# Patient Record
Sex: Female | Born: 1975
Health system: Southern US, Community
[De-identification: ages and names within clinical notes are randomized; demographics above are authoritative.]

## PROBLEM LIST (undated history)

## (undated) DIAGNOSIS — M1990 Unspecified osteoarthritis, unspecified site: Secondary | ICD-10-CM

## (undated) DIAGNOSIS — K5904 Chronic idiopathic constipation: Secondary | ICD-10-CM

## (undated) DIAGNOSIS — L900 Lichen sclerosus et atrophicus: Secondary | ICD-10-CM

## (undated) DIAGNOSIS — M5030 Other cervical disc degeneration, unspecified cervical region: Secondary | ICD-10-CM

## (undated) DIAGNOSIS — L658 Other specified nonscarring hair loss: Secondary | ICD-10-CM

## (undated) DIAGNOSIS — E785 Hyperlipidemia, unspecified: Secondary | ICD-10-CM

## (undated) DIAGNOSIS — E0500 Thyrotoxicosis with diffuse goiter without thyrotoxic crisis or storm: Secondary | ICD-10-CM

## (undated) DIAGNOSIS — T7840XA Allergy, unspecified, initial encounter: Secondary | ICD-10-CM

## (undated) DIAGNOSIS — I10 Essential (primary) hypertension: Secondary | ICD-10-CM

## (undated) DIAGNOSIS — I7300 Raynaud's syndrome without gangrene: Secondary | ICD-10-CM

## (undated) DIAGNOSIS — E039 Hypothyroidism, unspecified: Secondary | ICD-10-CM

## (undated) HISTORY — DX: Other cervical disc degeneration, unspecified cervical region: M50.30

## (undated) HISTORY — DX: Allergy, unspecified, initial encounter: T78.40XA

## (undated) HISTORY — DX: Other specified nonscarring hair loss: L65.8

## (undated) HISTORY — DX: Chronic idiopathic constipation: K59.04

## (undated) HISTORY — DX: Thyrotoxicosis with diffuse goiter without thyrotoxic crisis or storm: E05.00

## (undated) HISTORY — DX: Hyperlipidemia, unspecified: E78.5

## (undated) HISTORY — DX: Essential (primary) hypertension: I10

## (undated) HISTORY — PX: ABDOMINOPLASTY: SUR9

## (undated) HISTORY — DX: Unspecified osteoarthritis, unspecified site: M19.90

## (undated) HISTORY — PX: PLACEMENT OF BREAST IMPLANTS: SHX6334

## (undated) HISTORY — PX: COSMETIC SURGERY: SHX468

## (undated) HISTORY — PX: THYROID SURGERY: SHX805

## (undated) HISTORY — PX: TUBAL LIGATION: SHX77

## (undated) HISTORY — DX: Lichen sclerosus et atrophicus: L90.0

## (undated) HISTORY — PX: CHOLECYSTECTOMY: SHX55

## (undated) HISTORY — DX: Raynaud's syndrome without gangrene: I73.00

---

## 2011-05-10 DIAGNOSIS — F339 Major depressive disorder, recurrent, unspecified: Secondary | ICD-10-CM | POA: Insufficient documentation

## 2011-05-10 DIAGNOSIS — F32A Depression, unspecified: Secondary | ICD-10-CM | POA: Insufficient documentation

## 2011-05-10 DIAGNOSIS — F332 Major depressive disorder, recurrent severe without psychotic features: Secondary | ICD-10-CM | POA: Insufficient documentation

## 2014-08-01 DIAGNOSIS — K1370 Unspecified lesions of oral mucosa: Secondary | ICD-10-CM | POA: Insufficient documentation

## 2016-07-09 LAB — HM PAP SMEAR: HM Pap smear: NEGATIVE

## 2016-12-06 DIAGNOSIS — Z975 Presence of (intrauterine) contraceptive device: Secondary | ICD-10-CM | POA: Insufficient documentation

## 2019-03-19 ENCOUNTER — Ambulatory Visit (INDEPENDENT_AMBULATORY_CARE_PROVIDER_SITE_OTHER): Payer: No Typology Code available for payment source | Admitting: Family Medicine

## 2019-03-19 ENCOUNTER — Encounter: Payer: Self-pay | Admitting: Family Medicine

## 2019-03-19 VITALS — BP 112/76 | HR 72 | Ht 63.0 in | Wt 145.0 lb

## 2019-03-19 DIAGNOSIS — N924 Excessive bleeding in the premenopausal period: Secondary | ICD-10-CM

## 2019-03-19 DIAGNOSIS — R058 Other specified cough: Secondary | ICD-10-CM | POA: Insufficient documentation

## 2019-03-19 DIAGNOSIS — I10 Essential (primary) hypertension: Secondary | ICD-10-CM | POA: Diagnosis not present

## 2019-03-19 DIAGNOSIS — Z1231 Encounter for screening mammogram for malignant neoplasm of breast: Secondary | ICD-10-CM

## 2019-03-19 DIAGNOSIS — E785 Hyperlipidemia, unspecified: Secondary | ICD-10-CM | POA: Insufficient documentation

## 2019-03-19 DIAGNOSIS — R05 Cough: Secondary | ICD-10-CM

## 2019-03-19 DIAGNOSIS — E039 Hypothyroidism, unspecified: Secondary | ICD-10-CM | POA: Diagnosis not present

## 2019-03-19 DIAGNOSIS — L900 Lichen sclerosus et atrophicus: Secondary | ICD-10-CM

## 2019-03-19 DIAGNOSIS — L658 Other specified nonscarring hair loss: Secondary | ICD-10-CM | POA: Insufficient documentation

## 2019-03-19 DIAGNOSIS — N920 Excessive and frequent menstruation with regular cycle: Secondary | ICD-10-CM | POA: Insufficient documentation

## 2019-03-19 DIAGNOSIS — M545 Low back pain: Secondary | ICD-10-CM

## 2019-03-19 DIAGNOSIS — M47816 Spondylosis without myelopathy or radiculopathy, lumbar region: Secondary | ICD-10-CM | POA: Insufficient documentation

## 2019-03-19 DIAGNOSIS — G8929 Other chronic pain: Secondary | ICD-10-CM

## 2019-03-19 NOTE — Assessment & Plan Note (Signed)
Uses baclofen and meloxicam as needed.

## 2019-03-19 NOTE — Assessment & Plan Note (Signed)
Currently on treatment with topical minoxidil and oral finasteride.  Followed by dermatology.

## 2019-03-19 NOTE — Assessment & Plan Note (Signed)
Followed by GYN.  Uses as needed topical clobetasol ointment.

## 2019-03-19 NOTE — Assessment & Plan Note (Addendum)
Due to recheck TSH.  On Brand only Synthroid. Currently on treatment with topical minoxidil and oral finasteride.

## 2019-03-19 NOTE — Assessment & Plan Note (Signed)
Well controlled. Continue current regimen. Follow up in  6 mo

## 2019-03-19 NOTE — Assessment & Plan Note (Signed)
Responds well to Singulair. Triggered by strong smells like perfume.

## 2019-03-19 NOTE — Progress Notes (Signed)
New Patient Office Visit  Subjective:  Patient ID: April Webster, female    DOB: 1975-12-06  Age: 44 y.o. MRN: [id number]  CC:  Chief Complaint  Patient presents with  . Establish Care   HPI April Webster presents to establish care.    Hyperlipidemia - tolerating stating well with no myalgias or significant side effects.  She started on the medication 6 months ago.  Has not had follow-up lab work yet. No results found for: CHOL No results found for: HDL No results found for: LDLCALC No results found for: TRIG No results found for: CHOLHDL No results found for: LDLDIRECT  Hypertension- Pt denies chest pain, SOB, dizziness, or heart palpitations.  Taking meds as directed w/o problems.  Denies medication side effects.    Hypothyroidism - Taking medication regularly in the AM away from food and vitamins, etc. No recent change to skin, hair, or energy levels.  She says she usually is either between 112 mcg 125 mcg dose.  Her dose was just dropped to 112 mcg about 6 months ago she did have a 6-week follow-up but has not been rechecked since then.  History of hair loss-currently uses topical minoxidil and oral finasteride.  Feels like it works very well and is effective.  Followed by dermatology.  Reports a history of chronic low back pain he uses baclofen meloxicam as needed.  Has a history of allergic bronchospasm and since being on Singulair it has made a big difference.  She seems to be very sensitive to strong odors and smells.  GERD-uses omeprazole as needed.  3 of lichen sclerosis for greater than 10 years uses topical clobetasol ointment very rarely.  She also needs a screening mammogram.  She is at the point of getting replacement saline breast implants and needs an up-to-date mammogram before surgery.  He had a normal mammogram last year at [facility].    History reviewed. No pertinent past medical history.  History reviewed. No pertinent surgical history.  History  reviewed. No pertinent family history.  Social History   Socioeconomic History  . Marital status: Married    Spouse name: Not on file  . Number of children: Not on file  . Years of education: Not on file  . Highest education level: Not on file  Occupational History  . Not on file  Tobacco Use  . Smoking status: Former Smoker    Packs/day: 2.00    Years: 5.00    Pack years: 10.00    Types: Cigarettes    Quit date: 03/15/1996    Years since quitting: 23.0  . Smokeless tobacco: Never Used  Substance and Sexual Activity  . Alcohol use: Yes    Alcohol/week: 2.0 standard drinks    Types: 2 Glasses of wine per week  . Drug use: Never  . Sexual activity: Yes    Partners: Male  Other Topics Concern  . Not on file  Social History Narrative  . Not on file   Social Determinants of Health   Financial Resource Strain:   . Difficulty of Paying Living Expenses: Not on file  Food Insecurity:   . Worried About Charity fundraiser in the Last Year: Not on file  . Ran Out of Food in the Last Year: Not on file  Transportation Needs:   . Lack of Transportation (Medical): Not on file  . Lack of Transportation (Non-Medical): Not on file  Physical Activity:   . Days of Exercise per Week: Not  on file  . Minutes of Exercise per Session: Not on file  Stress:   . Feeling of Stress : Not on file  Social Connections:   . Frequency of Communication with Friends and Family: Not on file  . Frequency of Social Gatherings with Friends and Family: Not on file  . Attends Religious Services: Not on file  . Active Member of Clubs or Organizations: Not on file  . Attends Archivist Meetings: Not on file  . Marital Status: Not on file  Intimate Partner Violence:   . Fear of Current or Ex-Partner: Not on file  . Emotionally Abused: Not on file  . Physically Abused: Not on file  . Sexually Abused: Not on file    ROS Review of Systems  Constitutional: Negative for diaphoresis, fever and  unexpected weight change.  HENT: Negative for hearing loss, rhinorrhea and tinnitus.   Eyes: Negative for visual disturbance.  Respiratory: Negative for cough and wheezing.   Cardiovascular: Negative for chest pain and palpitations.  Gastrointestinal: Negative for blood in stool, diarrhea, nausea and vomiting.  Genitourinary: Negative for difficulty urinating, vaginal bleeding and vaginal discharge.  Musculoskeletal: Positive for back pain and neck pain. Negative for arthralgias and myalgias.  Skin: Negative for rash.  Neurological: Negative for headaches.  Hematological: Negative for adenopathy. Does not bruise/bleed easily.  Psychiatric/Behavioral: Negative for dysphoric mood and sleep disturbance (.cmhypot). The patient is not nervous/anxious.     Objective:   Today's Vitals: BP 112/76   Pulse 72   Ht 5' 3"  (1.6 m)   Wt 145 lb (65.8 kg)   SpO2 100%   BMI 25.69 kg/m   Physical Exam Constitutional:      Appearance: She is well-developed.  HENT:     Head: Normocephalic and atraumatic.  Cardiovascular:     Rate and Rhythm: Normal rate and regular rhythm.     Heart sounds: Normal heart sounds.  Pulmonary:     Effort: Pulmonary effort is normal.     Breath sounds: Normal breath sounds.  Chest:     Breasts:        Right: Normal. No swelling, inverted nipple, mass, nipple discharge, skin change or tenderness.        Left: Normal. No swelling, inverted nipple, mass, nipple discharge, skin change or tenderness.  Skin:    General: Skin is warm and dry.  Neurological:     Mental Status: She is alert and oriented to person, place, and time.  Psychiatric:        Behavior: Behavior normal.     Assessment & Plan:   Problem List Items Addressed This Visit      Cardiovascular and Mediastinum   Essential hypertension    Well controlled. Continue current regimen. Follow up in  6 mo      Relevant Medications   atorvastatin (LIPITOR) 20 MG tablet   hydrochlorothiazide  (HYDRODIURIL) 25 MG tablet   Other Relevant Orders   COMPLETE METABOLIC PANEL WITH GFR     Endocrine   Hypothyroidism - Primary    Due to recheck TSH.  On Brand only Synthroid. Currently on treatment with topical minoxidil and oral finasteride.      Relevant Medications   SYNTHROID 112 MCG tablet   Other Relevant Orders   TSH     Musculoskeletal and Integument   Lichen sclerosus    Followed by GYN.  Uses as needed topical clobetasol ointment.      Female pattern hair loss  Currently on treatment with topical minoxidil and oral finasteride.  Followed by dermatology.        Other   Upper airway cough syndrome    Responds well to Singulair. Triggered by strong smells like perfume.       Menorrhagia    Mirena IUD in place. Due to replace 2023      Hyperlipidemia   Relevant Medications   atorvastatin (LIPITOR) 20 MG tablet   hydrochlorothiazide (HYDRODIURIL) 25 MG tablet   Other Relevant Orders   COMPLETE METABOLIC PANEL WITH GFR   Lipid panel   Chronic low back pain    Uses baclofen and meloxicam as needed.      Relevant Medications   baclofen (LIORESAL) 10 MG tablet   meloxicam (MOBIC) 15 MG tablet    Other Visit Diagnoses    Screening mammogram, encounter for       Relevant Orders   MM SCREENING BREAST TOMO BILATERAL      Plan for screening mammogram for prior to surgery for saline implant replacement.  Outpatient Encounter Medications as of 03/19/2019  Medication Sig  . atorvastatin (LIPITOR) 20 MG tablet Take 20 mg by mouth at bedtime.  . baclofen (LIORESAL) 10 MG tablet Take 10 mg by mouth 3 (three) times daily as needed.  . clobetasol ointment (TEMOVATE) 3.88 % Apply 1 application topically daily as needed for rash. LIchen sclerosis  . esomeprazole (NEXIUM) 20 MG capsule Take 20 mg by mouth daily at 12 noon.  . finasteride (PROSCAR) 5 MG tablet Take 2.5 mg by mouth daily.  . hydrochlorothiazide (HYDRODIURIL) 25 MG tablet Take 25 mg by mouth daily.  Marland Kitchen  levonorgestrel (MIRENA) 20 MCG/24HR IUD by Intrauterine route.  . meloxicam (MOBIC) 15 MG tablet Take 15 mg by mouth daily as needed.  . montelukast (SINGULAIR) 10 MG tablet Take 10 mg by mouth daily as needed.  Marland Kitchen SYNTHROID 112 MCG tablet Take 112 mcg by mouth daily.   No facility-administered encounter medications on file as of 03/19/2019.    Follow-up: Return in about 6 months (around 09/16/2019) for Hypertension, thyroid, etc .   Beatrice Lecher, MD

## 2019-03-19 NOTE — Assessment & Plan Note (Signed)
Mirena IUD in place. Due to replace 2023

## 2019-03-28 LAB — TSH: TSH: 3.09 mIU/L

## 2019-03-28 LAB — COMPLETE METABOLIC PANEL WITH GFR
AG Ratio: 1.9 (calc) (ref 1.0–2.5)
ALT: 18 U/L (ref 6–29)
AST: 21 U/L (ref 10–30)
Albumin: 4.3 g/dL (ref 3.6–5.1)
Alkaline phosphatase (APISO): 57 U/L (ref 31–125)
BUN: 14 mg/dL (ref 7–25)
CO2: 28 mmol/L (ref 20–32)
Calcium: 9.6 mg/dL (ref 8.6–10.2)
Chloride: 102 mmol/L (ref 98–110)
Creat: 0.77 mg/dL (ref 0.50–1.10)
GFR, Est African American: 110 mL/min/{1.73_m2} (ref >=60)
GFR, Est Non African American: 95 mL/min/{1.73_m2} (ref >=60)
Globulin: 2.3 g/dL (calc) (ref 1.9–3.7)
Glucose, Bld: 80 mg/dL (ref 65–99)
Potassium: 3.9 mmol/L (ref 3.5–5.3)
Sodium: 140 mmol/L (ref 135–146)
Total Bilirubin: 0.5 mg/dL (ref 0.2–1.2)
Total Protein: 6.6 g/dL (ref 6.1–8.1)

## 2019-03-28 LAB — LIPID PANEL
Cholesterol: 151 mg/dL (ref <200)
HDL: 57 mg/dL (ref >=50)
LDL Cholesterol (Calc): 80 mg/dL (calc)
Non-HDL Cholesterol (Calc): 94 mg/dL (calc) (ref <130)
Total CHOL/HDL Ratio: 2.6 (calc) (ref <5.0)
Triglycerides: 60 mg/dL (ref <150)

## 2019-04-03 ENCOUNTER — Other Ambulatory Visit: Payer: Self-pay | Admitting: *Deleted

## 2019-04-03 MED ORDER — MELOXICAM 15 MG PO TABS: 15.0000 mg | ORAL_TABLET | Freq: Every day | ORAL | 1 refills | Status: DC | PRN

## 2019-04-03 MED ORDER — HYDROCHLOROTHIAZIDE 25 MG PO TABS: 25.0000 mg | ORAL_TABLET | Freq: Every day | ORAL | 1 refills | Status: DC

## 2019-04-03 MED ORDER — BACLOFEN 10 MG PO TABS: 10.0000 mg | ORAL_TABLET | Freq: Three times a day (TID) | ORAL | 1 refills | Status: DC | PRN

## 2019-04-03 MED ORDER — MONTELUKAST SODIUM 10 MG PO TABS: 10.0000 mg | ORAL_TABLET | Freq: Every day | ORAL | 3 refills | Status: DC | PRN

## 2019-04-03 MED ORDER — FINASTERIDE 5 MG PO TABS: 2.5000 mg | ORAL_TABLET | Freq: Every day | ORAL | 3 refills | Status: DC

## 2019-04-03 MED ORDER — SYNTHROID 112 MCG PO TABS: 112.0000 ug | ORAL_TABLET | Freq: Every day | ORAL | 1 refills | Status: DC

## 2019-04-03 MED ORDER — ATORVASTATIN CALCIUM 20 MG PO TABS: 20.0000 mg | ORAL_TABLET | Freq: Every day | ORAL | 3 refills | Status: DC

## 2019-04-03 MED FILL — HYDROCHLOROTHIAZIDE 25 MG T: 25 | 90 days supply | Qty: 90 | Fill #0

## 2019-04-03 MED FILL — MELOXICAM 15 MG TABLET: 15 | 90 days supply | Qty: 90 | Fill #0

## 2019-04-03 MED FILL — MONTELUKAST SOD 10 MG TAB: 10 | 90 days supply | Qty: 90 | Fill #0

## 2019-04-03 MED FILL — ATORVASTATIN 20 MG TABLET: 20 | 90 days supply | Qty: 90 | Fill #0

## 2019-04-03 MED FILL — BACLOFEN 10 MG TABS: 10 | 30 days supply | Qty: 90 | Fill #0

## 2019-04-03 MED FILL — SYNTHROID 112 MCG TABLET: 112 | 90 days supply | Qty: 90 | Fill #0

## 2019-04-03 MED FILL — FINASTERIDE 5 MG TABLET: 5 | 90 days supply | Qty: 45 | Fill #0

## 2019-05-01 MED FILL — CEPHALEXIN 500 MG CAPSULE: 500 | 3 days supply | Qty: 9 | Fill #0

## 2019-05-01 MED FILL — OXYCODONE-ACETAMINOPHEN 5-3: 5-325 | 3 days supply | Qty: 25 | Fill #0

## 2019-05-01 MED FILL — PROMETHAZINE 12.5 MG TABLET: 12.5 | 3 days supply | Qty: 10 | Fill #0

## 2019-05-14 HISTORY — PX: AUGMENTATION MAMMAPLASTY: SUR837

## 2019-05-16 ENCOUNTER — Telehealth: Payer: Self-pay | Admitting: Family Medicine

## 2019-05-16 DIAGNOSIS — K219 Gastro-esophageal reflux disease without esophagitis: Secondary | ICD-10-CM | POA: Insufficient documentation

## 2019-05-16 MED ORDER — PANTOPRAZOLE SODIUM 20 MG PO TBEC: 20.0000 mg | DELAYED_RELEASE_TABLET | Freq: Every day | ORAL | 1 refills | Status: DC

## 2019-05-16 MED FILL — PANTOPRAZOLE SOD DR 20 MG T: 20 | 90 days supply | Qty: 90 | Fill #0

## 2019-05-16 NOTE — Telephone Encounter (Signed)
Reports has been having more heartburn.  Currently getting Nexium OTC.  Would like to try pantoprazole instead.  New prescription sent to pharmacy.

## 2019-06-06 ENCOUNTER — Telehealth: Payer: Self-pay | Admitting: Family Medicine

## 2019-06-06 MED ORDER — HYDROCODONE-ACETAMINOPHEN 5-325 MG PO TABS: 1.0000 | ORAL_TABLET | Freq: Two times a day (BID) | ORAL | 0 refills | Status: AC | PRN

## 2019-06-06 MED FILL — HYDROCODON-APAP 5-325: 5-325 | 5 days supply | Qty: 10 | Fill #0

## 2019-06-06 NOTE — Telephone Encounter (Signed)
Pt called and is still having some right sided rib pain s/p surgery.  Using NSAIDs, heat/ice and having hard time getting rest at night bc of pain.  Will rx 10 tab of hydrocodone for bedtime only. After that if not improving will need to see her surgery.  Did check the [place] Controlled sub registry.  Consider trial of gabapentin as well if pain persists.

## 2019-07-11 MED FILL — HYDROCHLOROTHIAZIDE 25 MG T: 25 | 90 days supply | Qty: 90 | Fill #1

## 2019-07-11 MED FILL — SYNTHROID 112 MCG TABLET: 112 | 90 days supply | Qty: 90 | Fill #1

## 2019-07-11 MED FILL — FINASTERIDE 5 MG TABLET: 5 | 90 days supply | Qty: 45 | Fill #1

## 2019-07-11 MED FILL — MELOXICAM 15 MG TABLET: 15 | 90 days supply | Qty: 90 | Fill #1

## 2019-07-11 MED FILL — BACLOFEN 10 MG TABS: 10 | 30 days supply | Qty: 90 | Fill #1

## 2019-07-11 MED FILL — MONTELUKAST SOD 10 MG TAB: 10 | 90 days supply | Qty: 90 | Fill #1

## 2019-07-11 MED FILL — ATORVASTATIN 20 MG TABLET: 20 | 90 days supply | Qty: 90 | Fill #1

## 2019-08-20 ENCOUNTER — Ambulatory Visit (INDEPENDENT_AMBULATORY_CARE_PROVIDER_SITE_OTHER): Payer: No Typology Code available for payment source | Admitting: Family Medicine

## 2019-08-20 ENCOUNTER — Encounter: Payer: Self-pay | Admitting: Family Medicine

## 2019-08-20 VITALS — BP 122/79 | HR 70 | Ht 63.0 in | Wt 153.0 lb

## 2019-08-20 DIAGNOSIS — E039 Hypothyroidism, unspecified: Secondary | ICD-10-CM | POA: Diagnosis not present

## 2019-08-20 DIAGNOSIS — Z0000 Encounter for general adult medical examination without abnormal findings: Secondary | ICD-10-CM | POA: Diagnosis not present

## 2019-08-20 NOTE — Patient Instructions (Signed)
Health Maintenance, Female Adopting a healthy lifestyle and getting preventive care are important in promoting health and wellness. Ask your health care provider about:  The right schedule for you to have regular tests and exams.  Things you can do on your own to prevent diseases and keep yourself healthy. What should I know about diet, weight, and exercise? Eat a healthy diet   Eat a diet that includes plenty of vegetables, fruits, low-fat dairy products, and lean protein.  Do not eat a lot of foods that are high in solid fats, added sugars, or sodium. Maintain a healthy weight Body mass index (BMI) is used to identify weight problems. It estimates body fat based on height and weight. Your health care provider can help determine your BMI and help you achieve or maintain a healthy weight. Get regular exercise Get regular exercise. This is one of the most important things you can do for your health. Most adults should:  Exercise for at least 150 minutes each week. The exercise should increase your heart rate and make you sweat (moderate-intensity exercise).  Do strengthening exercises at least twice a week. This is in addition to the moderate-intensity exercise.  Spend less time sitting. Even light physical activity can be beneficial. Watch cholesterol and blood lipids Have your blood tested for lipids and cholesterol at 43 years of age, then have this test every 5 years. Have your cholesterol levels checked more often if:  Your lipid or cholesterol levels are high.  You are older than 43 years of age.  You are at high risk for heart disease. What should I know about cancer screening? Depending on your health history and family history, you may need to have cancer screening at various ages. This may include screening for:  Breast cancer.  Cervical cancer.  Colorectal cancer.  Skin cancer.  Lung cancer. What should I know about heart disease, diabetes, and high blood  pressure? Blood pressure and heart disease  High blood pressure causes heart disease and increases the risk of stroke. This is more likely to develop in people who have high blood pressure readings, are of African descent, or are overweight.  Have your blood pressure checked: ? Every 3-5 years if you are 43 years of age years of age. ? Every year if you are 43 years old or older. Diabetes Have regular diabetes screenings. This checks your fasting blood sugar level. Have the screening done:  Once every three years after age 40 if you are at a normal weight and have a low risk for diabetes.  More often and at a younger age if you are overweight or have a high risk for diabetes. What should I know about preventing infection? Hepatitis B If you have a higher risk for hepatitis B, you should be screened for this virus. Talk with your health care provider to find out if you are at risk for hepatitis B infection. Hepatitis C Testing is recommended for:  Everyone born from 1945 through 1965.  Anyone with known risk factors for hepatitis C. Sexually transmitted infections (STIs)  Get screened for STIs, including gonorrhea and chlamydia, if: ? You are sexually active and are younger than 43 years of age. ? You are older than 43 years of age and your health care provider tells you that you are at risk for this type of infection. ? Your sexual activity has changed since you were last screened, and you are at increased risk for chlamydia or gonorrhea. Ask your health care provider if   you are at risk.  Ask your health care provider about whether you are at high risk for HIV. Your health care provider may recommend a prescription medicine to help prevent HIV infection. If you choose to take medicine to prevent HIV, you should first get tested for HIV. You should then be tested every 3 months for as long as you are taking the medicine. Pregnancy  If you are about to stop having your period (premenopausal) and  you may become pregnant, seek counseling before you get pregnant.  Take 400 to 800 micrograms (mcg) of folic acid every day if you become pregnant.  Ask for birth control (contraception) if you want to prevent pregnancy. Osteoporosis and menopause Osteoporosis is a disease in which the bones lose minerals and strength with aging. This can result in bone fractures. If you are 43 years old or older, or if you are at risk for osteoporosis and fractures, ask your health care provider if you should:  Be screened for bone loss.  Take a calcium or vitamin D supplement to lower your risk of fractures.  Be given hormone replacement therapy (HRT) to treat symptoms of menopause. Follow these instructions at home: Lifestyle  Do not use any products that contain nicotine or tobacco, such as cigarettes, e-cigarettes, and chewing tobacco. If you need help quitting, ask your health care provider.  Do not use street drugs.  Do not share needles.  Ask your health care provider for help if you need support or information about quitting drugs. Alcohol use  Do not drink alcohol if: ? Your health care provider tells you not to drink. ? You are pregnant, may be pregnant, or are planning to become pregnant.  If you drink alcohol: ? Limit how much you use to 0-1 drink a day. ? Limit intake if you are breastfeeding.  Be aware of how much alcohol is in your drink. In the [place] one drink equals one 12 oz bottle of beer (355 mL), one 5 oz glass of wine (148 mL), or one 1 oz glass of hard liquor (44 mL). General instructions  Schedule regular health, dental, and eye exams.  Stay current with your vaccines.  Tell your health care provider if: ? You often feel depressed. ? You have ever been abused or do not feel safe at home. Summary  Adopting a healthy lifestyle and getting preventive care are important in promoting health and wellness.  Follow your health care provider's instructions about healthy  diet, exercising, and getting tested or screened for diseases.  Follow your health care provider's instructions on monitoring your cholesterol and blood pressure. This information is not intended to replace advice given to you by your health care provider. Make sure you discuss any questions you have with your health care provider. Document Revised: 02/22/2018 Document Reviewed: 02/22/2018 Elsevier Patient Education  2020 Elsevier Inc.  

## 2019-08-20 NOTE — Progress Notes (Signed)
Subjective:     April Webster is a 44 y.o. female and is here for a comprehensive physical exam. The patient reports problems - still having alot of trouble with her right low back.  She says the meloxicam is actually been really helpful and when she has not been able to take it her pain significantly increases.  She admits she has not been exercising regularly lately no specific routine.  She also uses baclofen at bedtime.  She has had previous MRI.  She also wanted to discuss difficulty losing weight.  She wonders if her thyroid medication might need to be adjusted.  She has been trying to eat very low calorie around maybe 1100 cal/day and just cannot seem to get the weight off.  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  He does have an IUD in that was placed back in 2018 and had a Pap smear at that time which she reports was normal.  Follow-up in 5 years.  Last mammogram was in 2019.  Social History   Socioeconomic History  . Marital status: Married    Spouse name: Not on file  . Number of children: Not on file  . Years of education: Not on file  . Highest education level: Not on file  Occupational History  . Not on file  Tobacco Use  . Smoking status: Former Smoker    Packs/day: 2.00    Years: 5.00    Pack years: 10.00    Types: Cigarettes    Quit date: 03/15/1996    Years since quitting: 23.4  . Smokeless tobacco: Never Used  Substance and Sexual Activity  . Alcohol use: Yes    Alcohol/week: 2.0 standard drinks    Types: 2 Glasses of wine per week  . Drug use: Never  . Sexual activity: Yes    Partners: Male  Other Topics Concern  . Not on file  Social History Narrative  . Not on file   Social Determinants of Health   Financial Resource Strain:   . Difficulty of Paying Living Expenses:   Food Insecurity:   . Worried About Charity fundraiser in the Last Year:   . Arboriculturist in the Last Year:    Transportation Needs:   . Film/video editor (Medical):   Marland Kitchen Lack of Transportation (Non-Medical):   Physical Activity:   . Days of Exercise per Week:   . Minutes of Exercise per Session:   Stress:   . Feeling of Stress :   Social Connections:   . Frequency of Communication with Friends and Family:   . Frequency of Social Gatherings with Friends and Family:   . Attends Religious Services:   . Active Member of Clubs or Organizations:   . Attends Archivist Meetings:   Marland Kitchen Marital Status:   Intimate Partner Violence:   . Fear of Current or Ex-Partner:   . Emotionally Abused:   Marland Kitchen Physically Abused:   . Sexually Abused:    Health Maintenance  Topic Date Due  . Hepatitis C Screening  Never done  . HIV Screening  Never done  . COVID-19 Vaccine (1) 08/19/2020 (Originally 09/02/1987)  . INFLUENZA VACCINE  10/14/2019  . PAP SMEAR-Modifier  12/07/2019  . TETANUS/TDAP  04/29/2027    The following portions of the patient's history were reviewed and updated as appropriate: allergies, current medications, past family history, past medical history, past social  history, past surgical history and problem list.  Review of Systems A comprehensive review of systems was negative.   Objective:    BP 122/79   Pulse 70   Ht 5' 3"  (1.6 m)   Wt 153 lb (69.4 kg)   SpO2 100%   BMI 27.10 kg/m  General appearance: alert, cooperative and appears stated age Head: Normocephalic, without obvious abnormality, atraumatic Eyes: conj clear, EOMi, PEERLA Ears: normal TM's and external ear canals both ears Nose: Nares normal. Septum midline. Mucosa normal. No drainage or sinus tenderness. Throat: lips, mucosa, and tongue normal; teeth and gums normal Neck: no adenopathy, no carotid bruit, no JVD, supple, symmetrical, trachea midline and thyroid not enlarged, symmetric, no tenderness/mass/nodules Back: symmetric, no curvature. ROM normal. No CVA tenderness. Lungs: clear to auscultation  bilaterally Breasts: normal appearance, no masses or tenderness Heart: regular rate and rhythm, S1, S2 normal, no murmur, click, rub or gallop Abdomen: soft, non-tender; bowel sounds normal; no masses,  no organomegaly Extremities: extremities normal, atraumatic, no cyanosis or edema Pulses: 2+ and symmetric Skin: Skin color, texture, turgor normal. No rashes or lesions Lymph nodes: Cervical, supraclavicular, and axillary nodes normal. Neurologic: Alert and oriented X 3, normal strength and tone. Normal symmetric reflexes. Normal coordination and gait    Assessment:    Healthy female exam.      Plan:     See After Visit Summary for Counseling Recommendations   Keep up a regular exercise program and make sure you are eating a healthy diet Try to eat 4 servings of dairy a day, or if you are lactose intolerant take a calcium with vitamin D daily.  Your vaccines are up to date.   Abnormal weight gain-discussed options.  We will start by just rechecking thyroid level make adjustments if needed.  If not could consider weight loss medication such as Qsymia, phentermine, or Saxenda.  Also encourage more regular exercise and activity.

## 2019-08-21 ENCOUNTER — Encounter: Payer: Self-pay | Admitting: Family Medicine

## 2019-08-21 LAB — CBC
HCT: 44.8 % (ref 35.0–45.0)
Hemoglobin: 15.3 g/dL (ref 11.7–15.5)
MCH: 32.3 pg (ref 27.0–33.0)
MCHC: 34.2 g/dL (ref 32.0–36.0)
MCV: 94.7 fL (ref 80.0–100.0)
MPV: 10.6 fL (ref 7.5–12.5)
Platelets: 273 10*3/uL (ref 140–400)
RBC: 4.73 10*6/uL (ref 3.80–5.10)
RDW: 12.4 % (ref 11.0–15.0)
WBC: 6.1 10*3/uL (ref 3.8–10.8)

## 2019-08-21 LAB — TSH: TSH: 2.3 mIU/L

## 2019-08-22 MED ORDER — PHENTERMINE HCL 37.5 MG PO TABS: ORAL_TABLET | ORAL | 0 refills | Status: DC

## 2019-08-22 NOTE — Telephone Encounter (Signed)
Checked with insurance.  Unfortunately nothing is well covered will be at least $100 per month.  Would like to consider phentermine if possible.  Options sent to pharmacy.  Start with half a tab daily for the first month.  Will need blood pressure and weight check in 1 month.  Beatrice Lecher, MD

## 2019-08-29 ENCOUNTER — Telehealth: Payer: Self-pay | Admitting: Sports Medicine

## 2019-08-29 DIAGNOSIS — M47816 Spondylosis without myelopathy or radiculopathy, lumbar region: Secondary | ICD-10-CM

## 2019-08-29 MED ORDER — PREDNISONE 50 MG PO TABS: ORAL_TABLET | ORAL | 0 refills | Status: DC

## 2019-08-29 NOTE — Telephone Encounter (Signed)
April Webster has had a long history of low back pain, she has had physical therapy, multiple NSAIDs, steroids, muscle relaxers in the past. She has tried tramadol, nothing is really seemed to help. 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 In fact her pain is worse with standing, and not as bad with sitting. She has seen a provider in the spine center who has suggested medial branch blocks for facet mediated pain, I tend to agree with the discussion of the pain generator. She can continue her NSAIDs, adding a 5-day burst of prednisone due to severe pain now. Continue physical therapy, if persistent discomfort I would recommend proceeding with right-sided L3-S1 facet joint injections. If she gets good relief from this then I think it would be reasonable to consider medial branch blocks and radiofrequency ablation.

## 2019-08-29 NOTE — Assessment & Plan Note (Addendum)
April Webster has had a long history of low back pain, she has had physical therapy, multiple NSAIDs, steroids, muscle relaxers in the past. She has tried tramadol, nothing is really seemed to help. 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 In fact her pain is worse with standing, and not as bad with sitting. She has seen a provider in the spine center who has suggested medial branch blocks for facet mediated pain, I tend to agree with the discussion of the pain generator. She can continue her NSAIDs, adding a 5-day burst of prednisone due to severe pain now. Continue physical therapy, if persistent discomfort I would recommend proceeding with right-sided L3-S1 facet joint injections. If she gets good relief from this then I think it would be reasonable to consider medial branch blocks and radiofrequency ablation.

## 2019-09-05 MED FILL — PANTOPRAZOLE SOD DR 20 MG T: 20 | 90 days supply | Qty: 90 | Fill #1

## 2019-09-20 ENCOUNTER — Encounter: Payer: Self-pay | Admitting: Family Medicine

## 2019-09-20 ENCOUNTER — Ambulatory Visit (INDEPENDENT_AMBULATORY_CARE_PROVIDER_SITE_OTHER): Payer: No Typology Code available for payment source | Admitting: Family Medicine

## 2019-09-20 VITALS — BP 121/87 | HR 78 | Ht 63.0 in | Wt 145.2 lb

## 2019-09-20 DIAGNOSIS — R635 Abnormal weight gain: Secondary | ICD-10-CM

## 2019-09-20 DIAGNOSIS — F5104 Psychophysiologic insomnia: Secondary | ICD-10-CM

## 2019-09-20 DIAGNOSIS — Z6825 Body mass index (BMI) 25.0-25.9, adult: Secondary | ICD-10-CM

## 2019-09-20 NOTE — Progress Notes (Signed)
Patient doing well on appetite suppressant.  Denies problems with insomnia, heart palpitations or tremors. Satisfied with weight loss thus far and is working on [employer] and regular exercise.

## 2019-09-20 NOTE — Progress Notes (Signed)
Established Patient Office Visit  Subjective:  Patient ID: April Webster, female    DOB: 12-29-1975  Age: 44 y.o. MRN: [id number]  CC:  Chief Complaint  Patient presents with  . Weight Check    HPI April Webster presents for follow-up weight loss.  She is currently taking half a tab of the 37.5 mg phentermine she is tolerating it well without any chest pain, shortness breath palpitations or change in sleep pattern.  She already has some chronic insomnia issues.  Most days only gets about 5 hours sleep except on the weekends.  In part because her husband snores.  She has tried medication in the past.  Ambien caused sleep talking and she did well with Belsomra but it was because of her hip it of.  She has been calories rectum to about 1000 to 1100 cal/day she is been not eating snacks she still struggling with getting in some protein and eating a little bit healthier for her evening meal.  She stays active but does not have a regular exercise routine.  She is down 8 lbs.    No past medical history on file.  Past Surgical History:  Procedure Laterality Date  . ABDOMINOPLASTY    . PLACEMENT OF BREAST IMPLANTS      No family history on file.  Social History   Socioeconomic History  . Marital status: Married    Spouse name: Not on file  . Number of children: Not on file  . Years of education: Not on file  . Highest education level: Not on file  Occupational History  . Not on file  Tobacco Use  . Smoking status: Former Smoker    Packs/day: 2.00    Years: 5.00    Pack years: 10.00    Types: Cigarettes    Quit date: 03/15/1996    Years since quitting: 23.5  . Smokeless tobacco: Never Used  Substance and Sexual Activity  . Alcohol use: Yes    Alcohol/week: 2.0 standard drinks    Types: 2 Glasses of wine per week  . Drug use: Never  . Sexual activity: Yes    Partners: Male  Other Topics Concern  . Not on file  Social History Narrative  . Not on file   Social Determinants  of Health   Financial Resource Strain:   . Difficulty of Paying Living Expenses:   Food Insecurity:   . Worried About Charity fundraiser in the Last Year:   . Arboriculturist in the Last Year:   Transportation Needs:   . Film/video editor (Medical):   Marland Kitchen Lack of Transportation (Non-Medical):   Physical Activity:   . Days of Exercise per Week:   . Minutes of Exercise per Session:   Stress:   . Feeling of Stress :   Social Connections:   . Frequency of Communication with Friends and Family:   . Frequency of Social Gatherings with Friends and Family:   . Attends Religious Services:   . Active Member of Clubs or Organizations:   . Attends Archivist Meetings:   Marland Kitchen Marital Status:   Intimate Partner Violence:   . Fear of Current or Ex-Partner:   . Emotionally Abused:   Marland Kitchen Physically Abused:   . Sexually Abused:     Outpatient Medications Prior to Visit  Medication Sig Dispense Refill  . atorvastatin (LIPITOR) 20 MG tablet Take 1 tablet (20 mg total) by mouth at bedtime. 90 tablet 3  .  baclofen (LIORESAL) 10 MG tablet Take 1 tablet (10 mg total) by mouth 3 (three) times daily as needed. 90 tablet 1  . clobetasol ointment (TEMOVATE) 0.62 % Apply 1 application topically daily as needed for rash. LIchen sclerosis    . diphenhydrAMINE (BENADRYL) 25 MG tablet Take 25 mg by mouth at bedtime as needed for sleep.    . finasteride (PROSCAR) 5 MG tablet Take 0.5 tablets (2.5 mg total) by mouth daily. 90 tablet 3  . hydrochlorothiazide (HYDRODIURIL) 25 MG tablet Take 1 tablet (25 mg total) by mouth daily. 90 tablet 1  . levonorgestrel (MIRENA) 20 MCG/24HR IUD by Intrauterine route.    . meloxicam (MOBIC) 15 MG tablet Take 1 tablet (15 mg total) by mouth daily as needed. 90 tablet 1  . montelukast (SINGULAIR) 10 MG tablet Take 1 tablet (10 mg total) by mouth daily as needed. 90 tablet 3  . pantoprazole (PROTONIX) 20 MG tablet Take 1 tablet (20 mg total) by mouth daily. 90 tablet 1   . phentermine (ADIPEX-P) 37.5 MG tablet 1/2 tab po QAM. 30 tablet 0  . SYNTHROID 112 MCG tablet Take 1 tablet (112 mcg total) by mouth daily. 90 tablet 1  . predniSONE (DELTASONE) 50 MG tablet One tab PO daily for 5 days. 5 tablet 0   No facility-administered medications prior to visit.    Allergies  Allergen Reactions  . Cefuroxime Axetil Rash  . Cephalexin Rash  . Tuberculin Purified Protein Derivative Rash    Erythema and itching without induration  . Ambien [Zolpidem] Other (See Comments)    Sleep talking    ROS Review of Systems    Objective:    Physical Exam Constitutional:      Appearance: She is well-developed.  HENT:     Head: Normocephalic and atraumatic.  Cardiovascular:     Rate and Rhythm: Normal rate and regular rhythm.     Heart sounds: Normal heart sounds.  Pulmonary:     Effort: Pulmonary effort is normal.     Breath sounds: Normal breath sounds.  Skin:    General: Skin is warm and dry.  Neurological:     Mental Status: She is alert and oriented to person, place, and time.  Psychiatric:        Behavior: Behavior normal.     BP 121/87   Pulse 78   Ht 5' 3"  (1.6 m)   Wt 145 lb 3.2 oz (65.9 kg)   SpO2 100%   BMI 25.72 kg/m  Wt Readings from Last 3 Encounters:  09/20/19 145 lb 3.2 oz (65.9 kg)  08/20/19 153 lb (69.4 kg)  03/19/19 145 lb (65.8 kg)     Health Maintenance Due  Topic Date Due  . Hepatitis C Screening  Never done  . HIV Screening  Never done    There are no preventive care reminders to display for this patient.  Lab Results  Component Value Date   TSH 2.30 08/20/2019   Lab Results  Component Value Date   WBC 6.1 08/20/2019   HGB 15.3 08/20/2019   HCT 44.8 08/20/2019   MCV 94.7 08/20/2019   PLT 273 08/20/2019   Lab Results  Component Value Date   NA 140 03/27/2019   K 3.9 03/27/2019   CO2 28 03/27/2019   GLUCOSE 80 03/27/2019   BUN 14 03/27/2019   CREATININE 0.77 03/27/2019   BILITOT 0.5 03/27/2019   AST  21 03/27/2019   ALT 18 03/27/2019   PROT 6.6 03/27/2019  CALCIUM 9.6 03/27/2019   Lab Results  Component Value Date   CHOL 151 03/27/2019   Lab Results  Component Value Date   HDL 57 03/27/2019   Lab Results  Component Value Date   LDLCALC 80 03/27/2019   Lab Results  Component Value Date   TRIG 60 03/27/2019   Lab Results  Component Value Date   CHOLHDL 2.6 03/27/2019   No results found for: HGBA1C    Assessment & Plan:   Problem List Items Addressed This Visit      Other   Chronic insomnia    Discussed that we could consider trying Belsomra again.  Unsure of cost on her current insurance plan but if she felt she would like to retry it we can certainly see if we can get it covered.       Other Visit Diagnoses    BMI 25.0-25.9,adult    -  Primary   Abnormal weight gain         She is done well and has already lost 8 pounds.   Current weight: 145 pounds Previous weight: 153 pounds Change in weight: 8 pounds Nutrition goal: Eat more veggies and try to get more lean proteins for the evening meal.  Continue to work on cutting back on carbohydrate intake. Exercise goal: Recommend trial exercise for 15 minutes 2 days a week initially with gradual increase. Medication: Half a tab at 37.5 mg phentermine. Follow up: 2 months   No orders of the defined types were placed in this encounter.   Follow-up: No follow-ups on file.    Beatrice Lecher, MD

## 2019-09-20 NOTE — Assessment & Plan Note (Signed)
Discussed that we could consider trying Belsomra again.  Unsure of cost on her current insurance plan but if she felt she would like to retry it we can certainly see if we can get it covered.

## 2019-10-15 ENCOUNTER — Other Ambulatory Visit: Payer: Self-pay | Admitting: Nurse Practitioner

## 2019-10-15 ENCOUNTER — Other Ambulatory Visit: Payer: Self-pay | Admitting: Family Medicine

## 2019-10-15 MED ORDER — PHENTERMINE HCL 37.5 MG PO TABS: ORAL_TABLET | ORAL | 0 refills | Status: DC

## 2019-10-15 MED ORDER — AMOXICILLIN-POT CLAVULANATE 875-125 MG PO TABS: 1.0000 | ORAL_TABLET | Freq: Two times a day (BID) | ORAL | 0 refills | Status: DC

## 2019-10-15 MED ORDER — FLUCONAZOLE 150 MG PO TABS: 150.0000 mg | ORAL_TABLET | Freq: Once | ORAL | 0 refills | Status: AC

## 2019-10-15 MED FILL — PHENTERMINE 37.5 MG TABLET: 37.5 | 60 days supply | Qty: 30 | Fill #0

## 2019-10-22 ENCOUNTER — Other Ambulatory Visit: Payer: Self-pay

## 2019-10-22 ENCOUNTER — Ambulatory Visit (INDEPENDENT_AMBULATORY_CARE_PROVIDER_SITE_OTHER): Payer: No Typology Code available for payment source | Admitting: Family Medicine

## 2019-10-22 ENCOUNTER — Encounter: Payer: Self-pay | Admitting: Family Medicine

## 2019-10-22 VITALS — BP 121/83 | HR 79 | Temp 97.5°F | Resp 16 | Ht 63.0 in | Wt 140.6 lb

## 2019-10-22 DIAGNOSIS — R635 Abnormal weight gain: Secondary | ICD-10-CM | POA: Diagnosis not present

## 2019-10-22 DIAGNOSIS — H9201 Otalgia, right ear: Secondary | ICD-10-CM

## 2019-10-22 MED ORDER — MOMETASONE FUROATE 50 MCG/ACT NA SUSP: 2.0000 | Freq: Every day | NASAL | 3 refills | Status: DC

## 2019-10-22 MED FILL — MOMETASONE FUROATE 50 MCG S: 50 | 30 days supply | Qty: 17 | Fill #0

## 2019-10-22 NOTE — Progress Notes (Signed)
Established Patient Office Visit  Subjective:  Patient ID: April Webster, female    DOB: March 12, 1976  Age: 44 y.o. MRN: [id number]  CC:  Chief Complaint  Patient presents with  . Weight Check    HPI April Webster presents for abnormal weight loss.  She is actually doing really well she is actually down about 5 more pounds since I last saw her though she did plateau over the last 10 to 11 days.  Though she was also constipated over that time and was finally able to have a bowel movement by taking a stimulant and then lost a little over a pound after that.  She does try to hydrate well.  She does not drink as much fluid during the day at work because she does have to urinate frequently but says she does really good when she gets home for the evening.  Says she is not exercising purposely but is just trying to be more active on the weekends normally she is more sedentary.  Right now she is not walking her dogs because of the heat processes it starts to cool she will do so.  Still having some right ear pain. She was seen about a week ago for left ear painFinished the Augementin and still having some popping and cracking, but now more in the right ear. Has been taking cold meds, Tylenol, and decongestants. Not on a nasal steroid spray.    History reviewed. No pertinent past medical history.  Past Surgical History:  Procedure Laterality Date  . ABDOMINOPLASTY    . PLACEMENT OF BREAST IMPLANTS      History reviewed. No pertinent family history.  Social History   Socioeconomic History  . Marital status: Married    Spouse name: Not on file  . Number of children: Not on file  . Years of education: Not on file  . Highest education level: Not on file  Occupational History  . Not on file  Tobacco Use  . Smoking status: Former Smoker    Packs/day: 2.00    Years: 5.00    Pack years: 10.00    Types: Cigarettes    Quit date: 03/15/1996    Years since quitting: 23.6  . Smokeless tobacco: Never  Used  Substance and Sexual Activity  . Alcohol use: Yes    Alcohol/week: 2.0 standard drinks    Types: 2 Glasses of wine per week  . Drug use: Never  . Sexual activity: Yes    Partners: Male  Other Topics Concern  . Not on file  Social History Narrative  . Not on file   Social Determinants of Health   Financial Resource Strain:   . Difficulty of Paying Living Expenses:   Food Insecurity:   . Worried About Charity fundraiser in the Last Year:   . Arboriculturist in the Last Year:   Transportation Needs:   . Film/video editor (Medical):   Marland Kitchen Lack of Transportation (Non-Medical):   Physical Activity:   . Days of Exercise per Week:   . Minutes of Exercise per Session:   Stress:   . Feeling of Stress :   Social Connections:   . Frequency of Communication with Friends and Family:   . Frequency of Social Gatherings with Friends and Family:   . Attends Religious Services:   . Active Member of Clubs or Organizations:   . Attends Archivist Meetings:   Marland Kitchen Marital Status:   Intimate Production manager  Violence:   . Fear of Current or Ex-Partner:   . Emotionally Abused:   Marland Kitchen Physically Abused:   . Sexually Abused:     Outpatient Medications Prior to Visit  Medication Sig Dispense Refill  . atorvastatin (LIPITOR) 20 MG tablet Take 1 tablet (20 mg total) by mouth at bedtime. 90 tablet 3  . baclofen (LIORESAL) 10 MG tablet Take 1 tablet (10 mg total) by mouth 3 (three) times daily as needed. 90 tablet 1  . clobetasol ointment (TEMOVATE) 4.09 % Apply 1 application topically daily as needed for rash. LIchen sclerosis    . diphenhydrAMINE (BENADRYL) 25 MG tablet Take 25 mg by mouth at bedtime as needed for sleep.    . finasteride (PROSCAR) 5 MG tablet Take 0.5 tablets (2.5 mg total) by mouth daily. 90 tablet 3  . hydrochlorothiazide (HYDRODIURIL) 25 MG tablet Take 1 tablet (25 mg total) by mouth daily. 90 tablet 1  . levonorgestrel (MIRENA) 20 MCG/24HR IUD by Intrauterine route.     . meloxicam (MOBIC) 15 MG tablet Take 1 tablet (15 mg total) by mouth daily as needed. 90 tablet 1  . montelukast (SINGULAIR) 10 MG tablet Take 1 tablet (10 mg total) by mouth daily as needed. 90 tablet 3  . pantoprazole (PROTONIX) 20 MG tablet Take 1 tablet (20 mg total) by mouth daily. 90 tablet 1  . phentermine (ADIPEX-P) 37.5 MG tablet 1/2 tab po QAM. 30 tablet 0  . SYNTHROID 112 MCG tablet Take 1 tablet (112 mcg total) by mouth daily. 90 tablet 1  . amoxicillin-clavulanate (AUGMENTIN) 875-125 MG tablet Take 1 tablet by mouth 2 (two) times daily. 14 tablet 0   No facility-administered medications prior to visit.    Allergies  Allergen Reactions  . Cefuroxime Axetil Rash  . Cephalexin Rash  . Tuberculin Purified Protein Derivative Rash    Erythema and itching without induration  . Ambien [Zolpidem] Other (See Comments)    Sleep talking    ROS Review of Systems    Objective:    Physical Exam Constitutional:      Appearance: She is well-developed.  HENT:     Head: Normocephalic and atraumatic.     Right Ear: Tympanic membrane, ear canal and external ear normal.     Left Ear: Tympanic membrane, ear canal and external ear normal.     Nose: Nose normal.  Cardiovascular:     Rate and Rhythm: Normal rate and regular rhythm.     Heart sounds: Normal heart sounds.  Pulmonary:     Effort: Pulmonary effort is normal.     Breath sounds: Normal breath sounds.  Skin:    General: Skin is warm and dry.  Neurological:     Mental Status: She is alert and oriented to person, place, and time.  Psychiatric:        Behavior: Behavior normal.     BP 121/83   Pulse 79   Temp (!) 97.5 F (36.4 C) (Oral)   Resp 16   Ht 5' 3"  (1.6 m)   Wt 140 lb 9.6 oz (63.8 kg)   SpO2 99%   BMI 24.91 kg/m  Wt Readings from Last 3 Encounters:  10/22/19 140 lb 9.6 oz (63.8 kg)  09/20/19 145 lb 3.2 oz (65.9 kg)  08/20/19 153 lb (69.4 kg)     Health Maintenance Due  Topic Date Due  .  Hepatitis C Screening  Never done  . HIV Screening  Never done  . INFLUENZA VACCINE  10/14/2019  . PAP SMEAR-Modifier  12/07/2019    There are no preventive care reminders to display for this patient.  Lab Results  Component Value Date   TSH 2.30 08/20/2019   Lab Results  Component Value Date   WBC 6.1 08/20/2019   HGB 15.3 08/20/2019   HCT 44.8 08/20/2019   MCV 94.7 08/20/2019   PLT 273 08/20/2019   Lab Results  Component Value Date   NA 140 03/27/2019   K 3.9 03/27/2019   CO2 28 03/27/2019   GLUCOSE 80 03/27/2019   BUN 14 03/27/2019   CREATININE 0.77 03/27/2019   BILITOT 0.5 03/27/2019   AST 21 03/27/2019   ALT 18 03/27/2019   PROT 6.6 03/27/2019   CALCIUM 9.6 03/27/2019   Lab Results  Component Value Date   CHOL 151 03/27/2019   Lab Results  Component Value Date   HDL 57 03/27/2019   Lab Results  Component Value Date   LDLCALC 80 03/27/2019   Lab Results  Component Value Date   TRIG 60 03/27/2019   Lab Results  Component Value Date   CHOLHDL 2.6 03/27/2019   No results found for: HGBA1C    Assessment & Plan:   Problem List Items Addressed This Visit    None    Visit Diagnoses    Abnormal weight gain    -  Primary   Right ear pain         Current weight: 140  pounds Previous weight: 145 pounds Change in weight: Down 5 pounds Nutrition goal: Eat more veggies and try to get more lean proteins for the evening meal.  Continue to work on cutting back on carbohydrate intake. Exercise goal: Recommend trial exercise for 15 minutes 2 days a week initially with gradual increase.  Still encourage increased activity level. Medication: Half a tab at 37.5 mg phentermine. Ok to increase to whole tab.   Follow up: 1 months   Right ear pain-bilateral ear exam is normal today suspect eustachian tube dysfunction.  She is already taking a decongestant.  She does not tolerate Flonase well so recommended a trial of either Nasonex or Rhinocort.  We will send  over prescription to pharmacy to see if it is cheaper than over-the-counter.  Always could do a trial of prednisone but that might counteract her attempts at weight loss.     Meds ordered this encounter  Medications  . mometasone (NASONEX) 50 MCG/ACT nasal spray    Sig: Place 2 sprays into the nose daily.    Dispense:  17 g    Refill:  3    Follow-up: Return in about 4 weeks (around 11/19/2019) for weight check.    Beatrice Lecher, MD

## 2019-11-05 ENCOUNTER — Other Ambulatory Visit: Payer: Self-pay | Admitting: Family Medicine

## 2019-11-05 ENCOUNTER — Other Ambulatory Visit: Payer: Self-pay | Admitting: *Deleted

## 2019-11-05 DIAGNOSIS — K219 Gastro-esophageal reflux disease without esophagitis: Secondary | ICD-10-CM

## 2019-11-05 MED ORDER — PANTOPRAZOLE SODIUM 20 MG PO TBEC: 20.0000 mg | DELAYED_RELEASE_TABLET | Freq: Every day | ORAL | 3 refills | Status: DC

## 2019-11-05 MED FILL — ATORVASTATIN CALCIUM 20 MG: 20 | 90 days supply | Qty: 90 | Fill #2

## 2019-11-05 MED FILL — MELOXICAM 15 MG TABLET: 15 | 90 days supply | Qty: 90 | Fill #0

## 2019-11-05 MED FILL — FINASTERIDE 5 MG TABLET: 5 | 90 days supply | Qty: 45 | Fill #2

## 2019-11-05 MED FILL — HYDROCHLOROTHIAZIDE 25 MG T: 25 | 90 days supply | Qty: 90 | Fill #0

## 2019-11-05 MED FILL — SYNTHROID 112 MCG TABLET: 112 | 90 days supply | Qty: 90 | Fill #0

## 2019-11-05 MED FILL — MONTELUKAST SOD 10 MG TAB: 10 | 90 days supply | Qty: 90 | Fill #2

## 2019-11-05 MED FILL — BACLOFEN 10 MG TABS: 10 | 30 days supply | Qty: 90 | Fill #0

## 2019-11-16 ENCOUNTER — Encounter: Payer: Self-pay | Admitting: Family Medicine

## 2019-11-16 ENCOUNTER — Ambulatory Visit (INDEPENDENT_AMBULATORY_CARE_PROVIDER_SITE_OTHER): Payer: No Typology Code available for payment source | Admitting: Family Medicine

## 2019-11-16 VITALS — BP 110/77 | HR 95 | Temp 97.7°F | Resp 16 | Ht 63.0 in | Wt 138.5 lb

## 2019-11-16 DIAGNOSIS — R635 Abnormal weight gain: Secondary | ICD-10-CM | POA: Diagnosis not present

## 2019-11-16 DIAGNOSIS — G478 Other sleep disorders: Secondary | ICD-10-CM | POA: Diagnosis not present

## 2019-11-16 MED ORDER — PHENTERMINE HCL 37.5 MG PO TABS
37.5000 mg | ORAL_TABLET | Freq: Every day | ORAL | 0 refills | Status: DC
Start: 2019-11-16 — End: 2019-12-14

## 2019-11-16 MED FILL — PHENTERMINE 37.5 MG TABLET: 37.5 | 30 days supply | Qty: 30 | Fill #0

## 2019-11-16 NOTE — Progress Notes (Signed)
Established Patient Office Visit  Subjective:  Patient ID: April Webster, female    DOB: Sep 03, 1975  Age: 44 y.o. MRN: [id number]  CC:  Chief Complaint  Patient presents with  . Weight Check    HPI April Webster presents for f/U abnormal weight gain.  She has increased to whole tab on the phentermine. She has been tolerating it well.  She has major sleep quality issues but does not feel like the medication has necessarily caused any increased disruption from her baseline.  Unfortunately her husband snores and so this makes it difficult as well as a lot of outside noises from her neighborhood.  She does use Benadryl at times which helps her fall asleep but then has trouble staying asleep.  Is really been working on her diet she does not feel like she can pare anything down more than what she is already doing.  Typically for breakfast she eats a boiled egg and some oatmeal or piece of fruit.  For lunch she has been mostly doing a lunch bowl to basically portion control and she does not have a lot of time for food prep.  In the evenings she has been trying to get a little bit more vegetables and and eating some grilled chicken strips that she has air frying.  Occasionally will have a little spoonful of some type of carb like macaroni and cheese.  She has not been able to incorporate exercise routinely yet but is starting to get more cool.  Normally this time he also start walking her dogs more regularly and will walk anywhere between 1 to 4 miles.  She does feel like she has plateaued with the medication recently  No past medical history on file.  Past Surgical History:  Procedure Laterality Date  . ABDOMINOPLASTY    . PLACEMENT OF BREAST IMPLANTS      No family history on file.  Social History   Socioeconomic History  . Marital status: Married    Spouse name: Not on file  . Number of children: Not on file  . Years of education: Not on file  . Highest education level: Not on file   Occupational History  . Not on file  Tobacco Use  . Smoking status: Former Smoker    Packs/day: 2.00    Years: 5.00    Pack years: 10.00    Types: Cigarettes    Quit date: 03/15/1996    Years since quitting: 23.6  . Smokeless tobacco: Never Used  Substance and Sexual Activity  . Alcohol use: Yes    Alcohol/week: 2.0 standard drinks    Types: 2 Glasses of wine per week  . Drug use: Never  . Sexual activity: Yes    Partners: Male  Other Topics Concern  . Not on file  Social History Narrative  . Not on file   Social Determinants of Health   Financial Resource Strain:   . Difficulty of Paying Living Expenses: Not on file  Food Insecurity:   . Worried About Charity fundraiser in the Last Year: Not on file  . Ran Out of Food in the Last Year: Not on file  Transportation Needs:   . Lack of Transportation (Medical): Not on file  . Lack of Transportation (Non-Medical): Not on file  Physical Activity:   . Days of Exercise per Week: Not on file  . Minutes of Exercise per Session: Not on file  Stress:   . Feeling of Stress : Not  on file  Social Connections:   . Frequency of Communication with Friends and Family: Not on file  . Frequency of Social Gatherings with Friends and Family: Not on file  . Attends Religious Services: Not on file  . Active Member of Clubs or Organizations: Not on file  . Attends Archivist Meetings: Not on file  . Marital Status: Not on file  Intimate Partner Violence:   . Fear of Current or Ex-Partner: Not on file  . Emotionally Abused: Not on file  . Physically Abused: Not on file  . Sexually Abused: Not on file    Outpatient Medications Prior to Visit  Medication Sig Dispense Refill  . atorvastatin (LIPITOR) 20 MG tablet Take 1 tablet (20 mg total) by mouth at bedtime. 90 tablet 3  . baclofen (LIORESAL) 10 MG tablet TAKE 1 TABLET (10 MG TOTAL) BY MOUTH 3 (THREE) TIMES DAILY AS NEEDED. 90 tablet 1  . clobetasol ointment (TEMOVATE) 6.38 %  Apply 1 application topically daily as needed for rash. LIchen sclerosis    . diphenhydrAMINE (BENADRYL) 25 MG tablet Take 25 mg by mouth at bedtime as needed for sleep.    . finasteride (PROSCAR) 5 MG tablet Take 0.5 tablets (2.5 mg total) by mouth daily. 90 tablet 3  . hydrochlorothiazide (HYDRODIURIL) 25 MG tablet TAKE 1 TABLET (25 MG TOTAL) BY MOUTH DAILY. 90 tablet 1  . levonorgestrel (MIRENA) 20 MCG/24HR IUD by Intrauterine route.    . meloxicam (MOBIC) 15 MG tablet TAKE 1 TABLET (15 MG TOTAL) BY MOUTH DAILY AS NEEDED. 90 tablet 1  . mometasone (NASONEX) 50 MCG/ACT nasal spray Place 2 sprays into the nose daily. 17 g 3  . montelukast (SINGULAIR) 10 MG tablet Take 1 tablet (10 mg total) by mouth daily as needed. 90 tablet 3  . pantoprazole (PROTONIX) 20 MG tablet Take 1 tablet (20 mg total) by mouth daily. 90 tablet 3  . SYNTHROID 112 MCG tablet TAKE 1 TABLET (112 MCG TOTAL) BY MOUTH DAILY. 90 tablet 1  . phentermine (ADIPEX-P) 37.5 MG tablet 1/2 tab po QAM. 30 tablet 0   No facility-administered medications prior to visit.    Allergies  Allergen Reactions  . Cefuroxime Axetil Rash  . Cephalexin Rash  . Tuberculin Purified Protein Derivative Rash    Erythema and itching without induration  . Ambien [Zolpidem] Other (See Comments)    Sleep talking    ROS Review of Systems    Objective:    Physical Exam Constitutional:      Appearance: She is well-developed.  HENT:     Head: Normocephalic and atraumatic.  Cardiovascular:     Rate and Rhythm: Normal rate and regular rhythm.     Heart sounds: Normal heart sounds.  Pulmonary:     Effort: Pulmonary effort is normal.     Breath sounds: Normal breath sounds.  Skin:    General: Skin is warm and dry.  Neurological:     Mental Status: She is alert and oriented to person, place, and time.  Psychiatric:        Behavior: Behavior normal.     BP 110/77   Pulse 95   Temp 97.7 F (36.5 C)   Resp 16   Ht 5' 3"  (1.6 m)   Wt  138 lb 8 oz (62.8 kg)   BMI 24.53 kg/m  Wt Readings from Last 3 Encounters:  11/16/19 138 lb 8 oz (62.8 kg)  10/22/19 140 lb 9.6 oz (63.8 kg)  09/20/19 145 lb 3.2 oz (65.9 kg)     Health Maintenance Due  Topic Date Due  . Hepatitis C Screening  Never done  . HIV Screening  Never done  . PAP SMEAR-Modifier  12/07/2019    There are no preventive care reminders to display for this patient.  Lab Results  Component Value Date   TSH 2.30 08/20/2019   Lab Results  Component Value Date   WBC 6.1 08/20/2019   HGB 15.3 08/20/2019   HCT 44.8 08/20/2019   MCV 94.7 08/20/2019   PLT 273 08/20/2019   Lab Results  Component Value Date   NA 140 03/27/2019   K 3.9 03/27/2019   CO2 28 03/27/2019   GLUCOSE 80 03/27/2019   BUN 14 03/27/2019   CREATININE 0.77 03/27/2019   BILITOT 0.5 03/27/2019   AST 21 03/27/2019   ALT 18 03/27/2019   PROT 6.6 03/27/2019   CALCIUM 9.6 03/27/2019   Lab Results  Component Value Date   CHOL 151 03/27/2019   Lab Results  Component Value Date   HDL 57 03/27/2019   Lab Results  Component Value Date   LDLCALC 80 03/27/2019   Lab Results  Component Value Date   TRIG 60 03/27/2019   Lab Results  Component Value Date   CHOLHDL 2.6 03/27/2019   No results found for: HGBA1C    Assessment & Plan:   Problem List Items Addressed This Visit      Other   Poor sleep pattern    Other Visit Diagnoses    Abnormal weight gain    -  Primary     She may be at a plateau she is now down to 138 pounds.  She would like to get a little bit less than that.  But will definitely need to incorporate exercise to burn calories and to stimulate her metabolism.  Also discussed the importance of getting quality sleep to help her lose weight.  Consider sleeping in a different bedroom than her husband.  I know this is not optimal.  Current weight: 138  pounds Previous weight: 141 pounds Change in weight: Down 3 pounds Nutrition goal: Eat more veggies and try  to get more lean proteins for the evening meal. Continue to work on cutting back on carbohydrate intake.  Try to be consistent with protein intake at dinnertime. Exercise goal: Recommend trial exercise for 15 minutes 2 days a week initially with gradual increase.  Still encourage increased activity level.  Plans to start walking her dogs regularly Medication: Half a tab at 37.5 mg phentermine. Ok to increase to whole tab.   Followup: 1 months.  Meds ordered this encounter  Medications  . phentermine (ADIPEX-P) 37.5 MG tablet    Sig: Take 1 tablet (37.5 mg total) by mouth daily before breakfast.    Dispense:  30 tablet    Refill:  0    Follow-up: Return in about 4 weeks (around 12/14/2019) for Weight check with nurse visit/PCP.   Time spent in encounter 20 minutes.  Beatrice Lecher, MD

## 2019-11-16 NOTE — Patient Instructions (Signed)
Nutrition goal: Eat more veggies and try to get more lean proteins for the evening meal.  Exercise goal: Recommend trial exercise for 10-15 minutes 2 days a week initially with gradual increase.   Medication: Whole tab phentermine 37.20m.   Followup: 1 months

## 2019-11-28 MED FILL — PANTOPRAZOLE SOD DR 20 MG T: 20 | 90 days supply | Qty: 90 | Fill #0

## 2019-12-03 ENCOUNTER — Ambulatory Visit (INDEPENDENT_AMBULATORY_CARE_PROVIDER_SITE_OTHER): Payer: No Typology Code available for payment source

## 2019-12-03 ENCOUNTER — Other Ambulatory Visit: Payer: Self-pay

## 2019-12-03 ENCOUNTER — Telehealth: Payer: Self-pay | Admitting: Sports Medicine

## 2019-12-03 DIAGNOSIS — S0993XA Unspecified injury of face, initial encounter: Secondary | ICD-10-CM
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
## 2019-12-03 MED ORDER — HYDROCODONE-ACETAMINOPHEN 5-325 MG PO TABS: 1.0000 | ORAL_TABLET | Freq: Three times a day (TID) | ORAL | 0 refills | Status: DC | PRN
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
## 2019-12-03 NOTE — Telephone Encounter (Signed)
[id number]

## 2019-12-13 ENCOUNTER — Ambulatory Visit (INDEPENDENT_AMBULATORY_CARE_PROVIDER_SITE_OTHER): Payer: No Typology Code available for payment source | Admitting: Family Medicine

## 2019-12-13 ENCOUNTER — Encounter: Payer: Self-pay | Admitting: Family Medicine

## 2019-12-13 VITALS — BP 110/80 | HR 101 | Temp 98.0°F | Resp 15 | Ht 63.0 in | Wt 134.9 lb

## 2019-12-13 DIAGNOSIS — M542 Cervicalgia: Secondary | ICD-10-CM | POA: Diagnosis not present

## 2019-12-13 DIAGNOSIS — Z6823 Body mass index (BMI) 23.0-23.9, adult: Secondary | ICD-10-CM | POA: Diagnosis not present

## 2019-12-13 DIAGNOSIS — R635 Abnormal weight gain: Secondary | ICD-10-CM | POA: Diagnosis not present

## 2019-12-14 ENCOUNTER — Encounter: Payer: Self-pay | Admitting: Family Medicine

## 2019-12-14 DIAGNOSIS — M542 Cervicalgia: Secondary | ICD-10-CM | POA: Insufficient documentation

## 2019-12-14 DIAGNOSIS — M5030 Other cervical disc degeneration, unspecified cervical region: Secondary | ICD-10-CM | POA: Insufficient documentation

## 2019-12-14 MED ORDER — PHENTERMINE HCL 15 MG PO CAPS: 15.0000 mg | ORAL_CAPSULE | ORAL | 1 refills | Status: DC

## 2019-12-14 NOTE — Progress Notes (Signed)
Established Patient Office Visit  Subjective:  Patient ID: April Webster, female    DOB: March 28, 1975  Age: 44 y.o. MRN: [id number]  CC:  Chief Complaint  Patient presents with  . abnormal weight gain    HPI April Webster presents for f/u abnormal weight gain. She has continued the phentermine and done well. Still no major side effects though having some constipation. Occ takes laxative to help.  She is now at her goal weight. She is down 4 more lbs which is awesome. She is feeling good overall. Some increased activity walking her dogs on cooler days but not routint exercise. Still portion controlling. Struggling getting in her veggies.   Also c/o of neck pain that has been flaring for 4 weeks. Has had on and off before.  Worse at the base of the neck and on the right side.  Has tried meloxiam, Tylenol. Heat and cie.  Heat helps temporarily.  No radicular sxs.  Hx of DDD of cervical spine.    History reviewed. No pertinent past medical history.  Past Surgical History:  Procedure Laterality Date  . ABDOMINOPLASTY    . PLACEMENT OF BREAST IMPLANTS      History reviewed. No pertinent family history.  Social History   Socioeconomic History  . Marital status: Married    Spouse name: Not on file  . Number of children: Not on file  . Years of education: Not on file  . Highest education level: Not on file  Occupational History  . Not on file  Tobacco Use  . Smoking status: Former Smoker    Packs/day: 2.00    Years: 5.00    Pack years: 10.00    Types: Cigarettes    Quit date: 03/15/1996    Years since quitting: 23.7  . Smokeless tobacco: Never Used  Substance and Sexual Activity  . Alcohol use: Yes    Alcohol/week: 2.0 standard drinks    Types: 2 Glasses of wine per week  . Drug use: Never  . Sexual activity: Yes    Partners: Male  Other Topics Concern  . Not on file  Social History Narrative  . Not on file   Social Determinants of Health   Financial Resource Strain:    . Difficulty of Paying Living Expenses: Not on file  Food Insecurity:   . Worried About Charity fundraiser in the Last Year: Not on file  . Ran Out of Food in the Last Year: Not on file  Transportation Needs:   . Lack of Transportation (Medical): Not on file  . Lack of Transportation (Non-Medical): Not on file  Physical Activity:   . Days of Exercise per Week: Not on file  . Minutes of Exercise per Session: Not on file  Stress:   . Feeling of Stress : Not on file  Social Connections:   . Frequency of Communication with Friends and Family: Not on file  . Frequency of Social Gatherings with Friends and Family: Not on file  . Attends Religious Services: Not on file  . Active Member of Clubs or Organizations: Not on file  . Attends Archivist Meetings: Not on file  . Marital Status: Not on file  Intimate Partner Violence:   . Fear of Current or Ex-Partner: Not on file  . Emotionally Abused: Not on file  . Physically Abused: Not on file  . Sexually Abused: Not on file    Outpatient Medications Prior to Visit  Medication Sig Dispense  Refill  . atorvastatin (LIPITOR) 20 MG tablet Take 1 tablet (20 mg total) by mouth at bedtime. 90 tablet 3  . baclofen (LIORESAL) 10 MG tablet TAKE 1 TABLET (10 MG TOTAL) BY MOUTH 3 (THREE) TIMES DAILY AS NEEDED. 90 tablet 1  . clobetasol ointment (TEMOVATE) 0.24 % Apply 1 application topically daily as needed for rash. LIchen sclerosis    . diphenhydrAMINE (BENADRYL) 25 MG tablet Take 25 mg by mouth at bedtime as needed for sleep.    . finasteride (PROSCAR) 5 MG tablet Take 0.5 tablets (2.5 mg total) by mouth daily. 90 tablet 3  . hydrochlorothiazide (HYDRODIURIL) 25 MG tablet TAKE 1 TABLET (25 MG TOTAL) BY MOUTH DAILY. 90 tablet 1  . HYDROcodone-acetaminophen (NORCO/VICODIN) 5-325 MG tablet Take 1 tablet by mouth every 8 (eight) hours as needed for moderate pain. 15 tablet 0  . levonorgestrel (MIRENA) 20 MCG/24HR IUD by Intrauterine route.     . meloxicam (MOBIC) 15 MG tablet TAKE 1 TABLET (15 MG TOTAL) BY MOUTH DAILY AS NEEDED. 90 tablet 1  . mometasone (NASONEX) 50 MCG/ACT nasal spray Place 2 sprays into the nose daily. 17 g 3  . montelukast (SINGULAIR) 10 MG tablet Take 1 tablet (10 mg total) by mouth daily as needed. 90 tablet 3  . pantoprazole (PROTONIX) 20 MG tablet Take 1 tablet (20 mg total) by mouth daily. 90 tablet 3  . SYNTHROID 112 MCG tablet TAKE 1 TABLET (112 MCG TOTAL) BY MOUTH DAILY. 90 tablet 1  . phentermine (ADIPEX-P) 37.5 MG tablet Take 1 tablet (37.5 mg total) by mouth daily before breakfast. 30 tablet 0   No facility-administered medications prior to visit.    Allergies  Allergen Reactions  . Cefuroxime Axetil Rash  . Cephalexin Rash  . Tuberculin Purified Protein Derivative Rash    Erythema and itching without induration  . Ambien [Zolpidem] Other (See Comments)    Sleep talking    ROS Review of Systems    Objective:    Physical Exam Constitutional:      Appearance: She is well-developed.  HENT:     Head: Normocephalic and atraumatic.  Cardiovascular:     Rate and Rhythm: Normal rate and regular rhythm.     Heart sounds: Normal heart sounds.  Pulmonary:     Effort: Pulmonary effort is normal.     Breath sounds: Normal breath sounds.  Skin:    General: Skin is warm and dry.  Neurological:     Mental Status: She is alert and oriented to person, place, and time.  Psychiatric:        Behavior: Behavior normal.     BP 110/80   Pulse (!) 101   Temp 98 F (36.7 C)   Resp 15   Ht 5' 3"  (1.6 m)   Wt 134 lb 14.4 oz (61.2 kg)   SpO2 99%   BMI 23.90 kg/m  Wt Readings from Last 3 Encounters:  12/13/19 134 lb 14.4 oz (61.2 kg)  11/16/19 138 lb 8 oz (62.8 kg)  10/22/19 140 lb 9.6 oz (63.8 kg)     Health Maintenance Due  Topic Date Due  . Hepatitis C Screening  Never done  . HIV Screening  Never done  . PAP SMEAR-Modifier  12/07/2019    There are no preventive care reminders to  display for this patient.  Lab Results  Component Value Date   TSH 2.30 08/20/2019   Lab Results  Component Value Date   WBC 6.1 08/20/2019  HGB 15.3 08/20/2019   HCT 44.8 08/20/2019   MCV 94.7 08/20/2019   PLT 273 08/20/2019   Lab Results  Component Value Date   NA 140 03/27/2019   K 3.9 03/27/2019   CO2 28 03/27/2019   GLUCOSE 80 03/27/2019   BUN 14 03/27/2019   CREATININE 0.77 03/27/2019   BILITOT 0.5 03/27/2019   AST 21 03/27/2019   ALT 18 03/27/2019   PROT 6.6 03/27/2019   CALCIUM 9.6 03/27/2019   Lab Results  Component Value Date   CHOL 151 03/27/2019   Lab Results  Component Value Date   HDL 57 03/27/2019   Lab Results  Component Value Date   LDLCALC 80 03/27/2019   Lab Results  Component Value Date   TRIG 60 03/27/2019   Lab Results  Component Value Date   CHOLHDL 2.6 03/27/2019   No results found for: HGBA1C    Assessment & Plan:   Problem List Items Addressed This Visit      Other   Cervical pain    Failed conservative therapy. Hx of DDD. recommend formal PT. No interest in dry needling.  F/u if not improving over course of PT.       Relevant Orders   Ambulatory referral to Physical Therapy    Other Visit Diagnoses    Abnormal weight gain    -  Primary   BMI 23.0-23.9, adult         Abnormal weight gain - BMI is now 23.  Will dec phentermine to 69m and taper over next 3 months. QD x 8 weeks and then every other day for the 3rd months. Continue to work on healthy diet and trying to incorporate more veggies to feel full with eating.  Also continue to work on increasing exercise.    Meds ordered this encounter  Medications  . phentermine 15 MG capsule    Sig: Take 1 capsule (15 mg total) by mouth every morning.    Dispense:  30 capsule    Refill:  1    Follow-up: No follow-ups on file.    CBeatrice Lecher MD

## 2019-12-14 NOTE — Assessment & Plan Note (Signed)
Failed conservative therapy. Hx of DDD. recommend formal PT. No interest in dry needling.  F/u if not improving over course of PT.

## 2019-12-17 MED FILL — PHENTERMINE HCL 15 MG CAPS: 15 | 30 days supply | Qty: 30 | Fill #0

## 2019-12-26 ENCOUNTER — Other Ambulatory Visit: Payer: Self-pay

## 2019-12-26 ENCOUNTER — Ambulatory Visit (INDEPENDENT_AMBULATORY_CARE_PROVIDER_SITE_OTHER): Payer: No Typology Code available for payment source | Admitting: Rehabilitative and Restorative Service Providers"

## 2019-12-26 ENCOUNTER — Encounter: Payer: Self-pay | Admitting: Rehabilitative and Restorative Service Providers"

## 2019-12-26 DIAGNOSIS — M542 Cervicalgia: Secondary | ICD-10-CM

## 2019-12-26 DIAGNOSIS — R293 Abnormal posture: Secondary | ICD-10-CM

## 2019-12-26 DIAGNOSIS — R29898 Other symptoms and signs involving the musculoskeletal system: Secondary | ICD-10-CM

## 2019-12-26 DIAGNOSIS — M539 Dorsopathy, unspecified: Secondary | ICD-10-CM

## 2019-12-26 NOTE — Patient Instructions (Signed)
Access Code: [email or web address]: [email or web address]: 10/13/2021Prepared by: Denman Pichardo HoltExercises  Doorway Pec Stretch at 60 Degrees Abduction - 3 x daily - 7 x weekly - 3 reps - 1 sets  Doorway Pec Stretch at 90 Degrees Abduction - 3 x daily - 7 x weekly - 3 reps - 1 sets - 30 seconds hold  Doorway Pec Stretch at 120 Degrees Abduction - 3 x daily - 7 x weekly - 3 reps - 1 sets - 30 second hold hold  Seated Cervical Retraction - 2 x daily - 7 x weekly - 1-2 sets - 5-10 reps - 10 sec hold  Seated Scapular Retraction - 2 x daily - 7 x weekly - 1-2 sets - 10 reps - 10 sec hold  Shoulder External Rotation and Scapular Retraction - 2 x daily - 7 x weekly - 1 sets - 3 reps - 30 sec hold  Shoulder External Rotation in 45 Degrees Abduction - 2 x daily - 7 x weekly - 1-2 sets - 10 reps - 3 sec hold Patient Education  TENS Unit  Office Posture
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
## 2020-01-02 ENCOUNTER — Ambulatory Visit (INDEPENDENT_AMBULATORY_CARE_PROVIDER_SITE_OTHER): Payer: No Typology Code available for payment source | Admitting: Rehabilitative and Restorative Service Providers"

## 2020-01-02 ENCOUNTER — Encounter: Payer: Self-pay | Admitting: Rehabilitative and Restorative Service Providers"

## 2020-01-02 ENCOUNTER — Other Ambulatory Visit: Payer: Self-pay

## 2020-01-02 DIAGNOSIS — R293 Abnormal posture: Secondary | ICD-10-CM | POA: Diagnosis not present

## 2020-01-02 DIAGNOSIS — M539 Dorsopathy, unspecified: Secondary | ICD-10-CM | POA: Diagnosis not present

## 2020-01-02 DIAGNOSIS — R29898 Other symptoms and signs involving the musculoskeletal system: Secondary | ICD-10-CM | POA: Diagnosis not present

## 2020-01-02 DIAGNOSIS — M542 Cervicalgia: Secondary | ICD-10-CM | POA: Diagnosis not present

## 2020-01-02 NOTE — Therapy (Signed)
[place], [place], [address] Phone: [phone]   Fax:  [phone]  Physical Therapy Treatment  Patient Details  Name: April Webster MRN: [id number] Date of Birth: [birth year] Referring Provider (PT): Dr Beatrice Lecher    Encounter Date: 01/02/2020   PT End of Session - 01/02/20 1221    Visit Number 2    Number of Visits 12    Date for PT Re-Evaluation 02/06/20    PT Start Time 0718    PT Stop Time 0800    PT Time Calculation (min) 42 min    Activity Tolerance Patient tolerated treatment well           History reviewed. No pertinent past medical history.  Past Surgical History:  Procedure Laterality Date   ABDOMINOPLASTY     PLACEMENT OF BREAST IMPLANTS      There were no vitals filed for this visit.   Subjective Assessment - 01/02/20 0720    Subjective The patient gets burning in CTJ region and parascapular region.    Pertinent History 3 C-sections; repair of rectus diastasis; falls with fx coccyx; gall bladder surgery; breast augmentation; LBP x >15 yrs; Rt lower quadrant pain with palpation    Patient Stated Goals get neck moving and feeling better    Currently in Pain? Yes    Pain Score 6     Pain Location Neck    Pain Orientation Right;Lower    Pain Descriptors / Indicators Aching;Burning    Pain Type Acute pain;Chronic pain    Pain Onset More than a month ago    Pain Frequency Constant    Aggravating Factors  looking up    Pain Relieving Factors nothing; heat helps some              [facility] PT Assessment - 01/02/20 0724      Assessment   Medical Diagnosis Cervical dysfunction     Referring Provider (PT) Dr Beatrice Lecher     Onset Date/Surgical Date 11/28/19    Hand Dominance Right                         [facility] Adult PT Treatment/Exercise - 01/02/20 0724      Exercises   Exercises Neck;Shoulder      Neck Exercises: Seated   Cervical  Isometrics Right lateral flexion;Left lateral flexion;5 secs    Neck Retraction 5 reps    Other Seated Exercise neck extension with mobilization with movement      Neck Exercises: Supine   Cervical Isometrics Right lateral flexion;Left lateral flexion;5 secs;5 reps      Shoulder Exercises: Seated   Retraction 5 reps    Retraction Limitations *Uses lumbar extension to accomplish movement      Shoulder Exercises: Prone   Retraction Strengthening;Right;Left;10 reps    Retraction Limitations T positions scapular retraction      Shoulder Exercises: Standing   External Rotation Strengthening;Both;12 reps    Retraction Strengthening;Both;12 reps    Other Standing Exercises wall push up x 12 reps with "plus" *gets bilateral wrist pain    Other Standing Exercises --      Shoulder Exercises: ROM/Strengthening   UBE (Upper Arm Bike) L1 x 1.5 minutes forward/1.5 back with increased burning      Manual Therapy   Manual Therapy Joint mobilization;Soft tissue mobilization    Manual therapy comments to improve joint mobility and reduce pain    Joint  Mobilization Prone:  mid and upper thoracic CPA mobs grade II/III; supine CPA c-spine    Soft tissue mobilization parascapular mobilization; c-spine STM    Manual Traction x 15 second hold x 3 reps                  PT Education - 01/02/20 1220    Education Details modified HEP scap retraction to standing at wall and added cervical isometrics    Person(s) Educated Patient    Methods Explanation;Demonstration    Comprehension Verbalized understanding;Returned demonstration               PT Long Term Goals - 12/26/19 1502      PT LONG TERM GOAL #1   Title Improve posture and alignment with patient to demonstrate improved upright posture with posterior shoulder girdle engaged    Time 6    Period Weeks    Status New    Target Date 02/05/20      PT LONG TERM GOAL #2   Title Decrease cervical pain with functional activities with  patient to report no more than 2-3/10 with looking up    Time 6    Period Weeks    Status New    Target Date 02/05/20      PT LONG TERM GOAL #3   Title Increase cervical ROM in extension by 15-20 degrees and Lt lateral flexion/Lt rotation by 3-5 degrees with all cervical motions pain free    Time 6    Period Weeks    Status New    Target Date 02/05/20      PT LONG TERM GOAL #4   Title Independent in HEP    Time 6    Period Weeks    Status New    Target Date 02/05/20      PT LONG TERM GOAL #5   Title Improve FOTO to </= 34% limitation    Time 6    Period Weeks    Status New    Target Date 02/05/20                 Plan - 01/02/20 1223    Clinical Impression Statement The patient continues with tightness in upper t-spine and neck.  PT continuing to progress to patient tolerance modifying HEP.  Plan to continue to LTGs increasing postural strengthening as tolerated.    Stability/Clinical Decision Making Stable/Uncomplicated    Rehab Potential Good    PT Frequency 2x / week    PT Duration 6 weeks    PT Treatment/Interventions ADLs/Self Care Home Management;Aquatic Therapy;Cryotherapy;Electrical Stimulation;Iontophoresis 70m/ml Dexamethasone;Moist Heat;Ultrasound;Functional mobility training;Therapeutic activities;Therapeutic exercise;Balance training;Neuromuscular re-education;Patient/family education;Manual techniques;Dry needling;Taping    PT Next Visit Plan Review HEP; continue with postural correction and education; manual work (no DN!); stretch pecs; strengthening posterior shoulder girdle musculature; modalities as indicated    PT Home Exercise Plan [email or web address]    Consulted and Agree with Plan of Care Patient           Patient will benefit from skilled therapeutic intervention in order to improve the following deficits and impairments:  Decreased range of motion, Increased fascial restricitons, Decreased activity tolerance, Pain, Hypomobility, Impaired flexibility,  Improper body mechanics, Decreased mobility, Impaired sensation, Postural dysfunction  Visit Diagnosis: Cervicalgia  Cervical dysfunction  Abnormal posture  Other symptoms and signs involving the musculoskeletal system     Problem List Patient Active Problem List   Diagnosis Date Noted   Cervical pain 12/14/2019   Facial trauma 12/03/2019  Poor sleep pattern 11/16/2019   Chronic insomnia 09/20/2019   Gastroesophageal reflux disease 05/16/2019   Hypothyroidism 03/19/2019   Hyperlipidemia 03/19/2019   Essential hypertension 03/19/2019   Facet syndrome, lumbar 03/19/2019   Menorrhagia 03/19/2019   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

## 2020-01-02 NOTE — Patient Instructions (Signed)
Access Code: [email or web address] URL: [email or web address] Date: 01/02/2020 Prepared by: Rudell Cobb  Exercises Doorway Pec Stretch at 60 Degrees Abduction - 3 x daily - 7 x weekly - 3 reps - 1 sets Doorway Pec Stretch at 90 Degrees Abduction - 3 x daily - 7 x weekly - 3 reps - 1 sets - 30 seconds hold Doorway Pec Stretch at 120 Degrees Abduction - 3 x daily - 7 x weekly - 3 reps - 1 sets - 30 second hold hold Seated Cervical Retraction - 2 x daily - 7 x weekly - 1-2 sets - 5-10 reps - 10 sec hold Standing Anatomical Position with Scapular Retraction and Depression at Wall - 2 x daily - 7 x weekly - 1 sets - 10 reps Shoulder External Rotation and Scapular Retraction - 2 x daily - 7 x weekly - 1 sets - 10 reps - 5 sec hold Shoulder External Rotation in 45 Degrees Abduction - 2 x daily - 7 x weekly - 1-2 sets - 10 reps - 3 sec hold Standing Isometric Cervical Sidebending with Manual Resistance - 2 x daily - 7 x weekly - 1 sets - 5 reps - 5 seconds hold

## 2020-01-07 ENCOUNTER — Other Ambulatory Visit: Payer: Self-pay

## 2020-01-07 ENCOUNTER — Ambulatory Visit (INDEPENDENT_AMBULATORY_CARE_PROVIDER_SITE_OTHER): Payer: No Typology Code available for payment source | Admitting: Rehabilitative and Restorative Service Providers"

## 2020-01-07 ENCOUNTER — Encounter: Payer: Self-pay | Admitting: Rehabilitative and Restorative Service Providers"

## 2020-01-07 DIAGNOSIS — M539 Dorsopathy, unspecified: Secondary | ICD-10-CM | POA: Diagnosis not present

## 2020-01-07 DIAGNOSIS — R29898 Other symptoms and signs involving the musculoskeletal system: Secondary | ICD-10-CM

## 2020-01-07 DIAGNOSIS — R293 Abnormal posture: Secondary | ICD-10-CM | POA: Diagnosis not present

## 2020-01-07 DIAGNOSIS — M542 Cervicalgia: Secondary | ICD-10-CM

## 2020-01-07 NOTE — Therapy (Signed)
[place], [place], [address] Phone: [phone]   Fax:  [phone]  Physical Therapy Treatment  Patient Details  Name: April Webster MRN: [id number] Date of Birth: Mar 05, 1976 Referring Provider (PT): Dr Beatrice Lecher    Encounter Date: 01/07/2020   PT End of Session - 01/07/20 0718    Visit Number 3    Number of Visits 12    Date for PT Re-Evaluation 02/06/20    PT Start Time 0716    PT Stop Time 0800    PT Time Calculation (min) 44 min    Activity Tolerance Patient tolerated treatment well           History reviewed. No pertinent past medical history.  Past Surgical History:  Procedure Laterality Date   ABDOMINOPLASTY     PLACEMENT OF BREAST IMPLANTS      There were no vitals filed for this visit.   Subjective Assessment - 01/07/20 0718    Subjective Neck is much worse today. Did not sleep well last night. Upon reflection remembers that she did bring out her fall clothes yesterday and was hanging the clothes. Some changes. Having pain between shoulder blades. Trying to fit the exercises in a work - more difficult at home.    Currently in Pain? Yes    Pain Score 5     Pain Location Neck    Pain Orientation Right;Lower    Pain Descriptors / Indicators Aching;Burning    Pain Type Acute pain;Chronic pain              [facility] PT Assessment - 01/07/20 0001      Assessment   Medical Diagnosis Cervical dysfunction     Referring Provider (PT) Dr Beatrice Lecher     Onset Date/Surgical Date 11/28/19    Hand Dominance Right    Prior Therapy yes for LBP       AROM   Overall AROM Comments improving tissue extensibility with doorway stretch     Cervical Flexion 72    Cervical Extension 32 with pain to mid back    Cervical - Right Side Bend 48    Cervical - Left Side Bend 42    Cervical - Right Rotation 67    Cervical - Left Rotation 62      Palpation   Palpation comment  muscular tightness in the ant/lat/posterior cervical musculature; pecs; upper traps; thoracic paraspinals bilat                          [facility] Adult PT Treatment/Exercise - 01/07/20 0001      Neck Exercises: Prone   W Back 5 reps    W Back Weights (lbs) VC for head position       Shoulder Exercises: Standing   External Rotation Strengthening;Both;10 reps    Retraction Strengthening;Both;10 reps    Other Standing Exercises chin tuck 5 sec x 5 reps       Shoulder Exercises: ROM/Strengthening   UBE (Upper Arm Bike) L1 x 1.5 minutes forward/1.5 back       Shoulder Exercises: Stretch   Other Shoulder Stretches doorway stretch 30 sec x 2 reps each position       Moist Heat Therapy   Number Minutes Moist Heat 5 Minutes    Moist Heat Location Cervical      Manual Therapy   Manual therapy comments to improve joint mobility and reduce pain  Joint Mobilization Prone:  mid and upper thoracic CPA and lateral mobs grade II/III; supine CPA c-spine    Soft tissue mobilization prone - thoracic paraspinals; supine - ant/lat/post cervical musculature; upper trap; pecs bilat     Passive ROM passive cervical flexion 10 sec hold x 3 reps     Manual Traction x 20 second hold x 3 reps    Other Manual Therapy occipital inhibition                   PT Education - 01/07/20 0740    Education Details HEP    Person(s) Educated Patient    Methods Explanation;Demonstration;Tactile cues;Verbal cues;Handout    Comprehension Verbalized understanding;Returned demonstration;Verbal cues required;Tactile cues required               PT Long Term Goals - 12/26/19 1502      PT LONG TERM GOAL #1   Title Improve posture and alignment with patient to demonstrate improved upright posture with posterior shoulder girdle engaged    Time 6    Period Weeks    Status New    Target Date 02/05/20      PT LONG TERM GOAL #2   Title Decrease cervical pain with functional activities with  patient to report no more than 2-3/10 with looking up    Time 6    Period Weeks    Status New    Target Date 02/05/20      PT LONG TERM GOAL #3   Title Increase cervical ROM in extension by 15-20 degrees and Lt lateral flexion/Lt rotation by 3-5 degrees with all cervical motions pain free    Time 6    Period Weeks    Status New    Target Date 02/05/20      PT LONG TERM GOAL #4   Title Independent in HEP    Time 6    Period Weeks    Status New    Target Date 02/05/20      PT LONG TERM GOAL #5   Title Improve FOTO to </= 34% limitation    Time 6    Period Weeks    Status New    Target Date 02/05/20                 Plan - 01/07/20 0733    Clinical Impression Statement Tightness and pain in the thoracic spine and neck. Working on thoracic mobs in prone adding thoracic extension exercise in prone. Continue with manual work through the cervical spine.    Rehab Potential Good    PT Frequency 2x / week    PT Duration 6 weeks    PT Treatment/Interventions ADLs/Self Care Home Management;Aquatic Therapy;Cryotherapy;Electrical Stimulation;Iontophoresis 76m/ml Dexamethasone;Moist Heat;Ultrasound;Functional mobility training;Therapeutic activities;Therapeutic exercise;Balance training;Neuromuscular re-education;Patient/family education;Manual techniques;Dry needling;Taping    PT Next Visit Plan Review HEP; continue with postural correction and education; manual work (no DN!); stretch pecs; strengthening posterior shoulder girdle musculature; modalities as indicated    PT Home Exercise Plan [email or web address]    Consulted and Agree with Plan of Care Patient           Patient will benefit from skilled therapeutic intervention in order to improve the following deficits and impairments:     Visit Diagnosis: Cervicalgia  Cervical dysfunction  Abnormal posture  Other symptoms and signs involving the musculoskeletal system     Problem List Patient Active Problem List   Diagnosis  Date Noted   Cervical pain 12/14/2019   Facial  trauma 12/03/2019   Poor sleep pattern 11/16/2019   Chronic insomnia 09/20/2019   Gastroesophageal reflux disease 05/16/2019   Hypothyroidism 03/19/2019   Hyperlipidemia 03/19/2019   Essential hypertension 03/19/2019   Facet syndrome, lumbar 03/19/2019   Menorrhagia 03/19/2019   Female pattern hair loss 65/46/5035   Lichen sclerosus 46/56/8127   Upper airway cough syndrome 03/19/2019   IUD (intrauterine device) in place 12/06/2016   Lesion of oral mucosa 08/01/2014   Depression 05/10/2011    Durwin Davisson Nilda Simmer PT, MPH  01/07/2020, 8:03 AM  [facility] [place], [place], [address] Phone: [phone]   Fax:  [phone]  Name: April Webster MRN: [id number] Date of Birth: 08/01/1975

## 2020-01-07 NOTE — Patient Instructions (Signed)
Access Code: [email or web address]: [email or web address]: 10/25/2021Prepared by: Essynce Munsch HoltExercises  Doorway Pec Stretch at 60 Degrees Abduction - 3 x daily - 7 x weekly - 3 reps - 1 sets  Doorway Pec Stretch at 90 Degrees Abduction - 3 x daily - 7 x weekly - 3 reps - 1 sets - 30 seconds hold  Doorway Pec Stretch at 120 Degrees Abduction - 3 x daily - 7 x weekly - 3 reps - 1 sets - 30 second hold hold  Seated Cervical Retraction - 2 x daily - 7 x weekly - 1-2 sets - 5-10 reps - 10 sec hold  Standing Anatomical Position with Scapular Retraction and Depression at Wall - 2 x daily - 7 x weekly - 1 sets - 10 reps  Shoulder External Rotation and Scapular Retraction - 2 x daily - 7 x weekly - 1 sets - 10 reps - 5 sec hold  Shoulder External Rotation in 45 Degrees Abduction - 2 x daily - 7 x weekly - 1-2 sets - 10 reps - 3 sec hold  Standing Isometric Cervical Sidebending with Manual Resistance - 2 x daily - 7 x weekly - 1 sets - 5 reps - 5 seconds hold  Thoracic Extension Mobilization with Noodle - 2 x daily - 7 x weekly - 1 sets - 3 reps - 30 sec hold  Prone Upper Back Extension Off Table with Hands Behind Head - 2 x daily - 7 x weekly - 1 sets - 5-10 reps - 5-10 sec hold  Supine Scapular Retraction - 2 x daily - 7 x weekly - 1 sets - 10 reps - 5-10 sec hold

## 2020-01-09 ENCOUNTER — Ambulatory Visit (INDEPENDENT_AMBULATORY_CARE_PROVIDER_SITE_OTHER): Payer: No Typology Code available for payment source | Admitting: Rehabilitative and Restorative Service Providers"

## 2020-01-09 ENCOUNTER — Encounter: Payer: Self-pay | Admitting: Rehabilitative and Restorative Service Providers"

## 2020-01-09 ENCOUNTER — Other Ambulatory Visit: Payer: Self-pay | Admitting: *Deleted

## 2020-01-09 ENCOUNTER — Other Ambulatory Visit: Payer: Self-pay

## 2020-01-09 DIAGNOSIS — R29898 Other symptoms and signs involving the musculoskeletal system: Secondary | ICD-10-CM | POA: Diagnosis not present

## 2020-01-09 DIAGNOSIS — R300 Dysuria: Secondary | ICD-10-CM

## 2020-01-09 DIAGNOSIS — R293 Abnormal posture: Secondary | ICD-10-CM | POA: Diagnosis not present

## 2020-01-09 DIAGNOSIS — M542 Cervicalgia: Secondary | ICD-10-CM

## 2020-01-09 DIAGNOSIS — M539 Dorsopathy, unspecified: Secondary | ICD-10-CM | POA: Diagnosis not present

## 2020-01-09 LAB — POCT URINALYSIS DIP (CLINITEK)
Bilirubin, UA: NEGATIVE
Blood, UA: NEGATIVE
Glucose, UA: NEGATIVE mg/dL
Ketones, POC UA: NEGATIVE mg/dL
Leukocytes, UA: NEGATIVE
Nitrite, UA: NEGATIVE
Spec Grav, UA: >=1.03 — AB (ref 1.010–1.025)
Urobilinogen, UA: 0.2 E.U./dL
pH, UA: 7 (ref 5.0–8.0)

## 2020-01-09 NOTE — Patient Instructions (Addendum)
Access Code: [email or web address]: [email or web address]: 10/27/2021Prepared by: Azaiah Licciardi HoltExercises  Doorway Pec Stretch at 60 Degrees Abduction - 3 x daily - 7 x weekly - 3 reps - 1 sets  Doorway Pec Stretch at 90 Degrees Abduction - 3 x daily - 7 x weekly - 3 reps - 1 sets - 30 seconds hold  Doorway Pec Stretch at 120 Degrees Abduction - 3 x daily - 7 x weekly - 3 reps - 1 sets - 30 second hold hold  Seated Cervical Retraction - 2 x daily - 7 x weekly - 1-2 sets - 5-10 reps - 10 sec hold  Standing Anatomical Position with Scapular Retraction and Depression at Wall - 2 x daily - 7 x weekly - 1 sets - 10 reps  Shoulder External Rotation and Scapular Retraction - 2 x daily - 7 x weekly - 1 sets - 10 reps - 5 sec hold  Shoulder External Rotation in 45 Degrees Abduction - 2 x daily - 7 x weekly - 1-2 sets - 10 reps - 3 sec hold  Standing Isometric Cervical Sidebending with Manual Resistance - 2 x daily - 7 x weekly - 1 sets - 5 reps - 5 seconds hold  Thoracic Extension Mobilization with Noodle - 2 x daily - 7 x weekly - 1 sets - 3 reps - 30 sec hold  Prone Upper Back Extension Off Table with Hands Behind Head - 2 x daily - 7 x weekly - 1 sets - 5-10 reps - 5-10 sec hold  Supine Scapular Retraction - 2 x daily - 7 x weekly - 1 sets - 10 reps - 5-10 sec hold  Prone Shoulder Extension - 2 x daily - 7 x weekly - 1 sets - 3 reps - 30 sec hold  Prone Chest Lift with Arms Overhead - 2 x daily - 7 x weekly - 1 sets - 3 reps - 30 sec hold Prone arms at side  Goalpost W Airplane Superman   Neurovascular: Median Nerve Stretch - Supine    Lie with neck supported, side-bent away from moving arm. Hold right arm out to side, elbow bent, thumb down, fingers and wrist bent back. Slowly straighten elbowas far as possible without pain. Hold for _30-60___ seconds. Repeat _2___ times per set. Do __2__ per day

## 2020-01-09 NOTE — Therapy (Signed)
[place], [place], [address] Phone: [phone]   Fax:  [phone]  Physical Therapy Treatment  Patient Details  Name: April Webster MRN: [id number] Date of Birth: 1975/08/12 Referring Provider (PT): Dr Beatrice Lecher    Encounter Date: 01/09/2020   PT End of Session - 01/09/20 1352    Visit Number 4    Number of Visits 12    Date for PT Re-Evaluation 02/06/20    PT Start Time 3762    PT Stop Time 1442    PT Time Calculation (min) 53 min    Activity Tolerance Patient tolerated treatment well           History reviewed. No pertinent past medical history.  Past Surgical History:  Procedure Laterality Date  . ABDOMINOPLASTY    . PLACEMENT OF BREAST IMPLANTS      There were no vitals filed for this visit.   Subjective Assessment - 01/09/20 1352    Subjective Patient reports that she has been using her TENS unit at home. Having trouble sleeping. Some positive changes. She does notice that her fingers are not turning purple as often. She is working on the posture at work and has worked on exercises at home.    Currently in Pain? Yes    Pain Score 4     Pain Location Neck    Pain Orientation Right;Lower    Pain Descriptors / Indicators Aching;Burning                             [facility] Adult PT Treatment/Exercise - 01/09/20 0001      Exercises   Exercises --   piriformis;lat trunk flexion stretch for Rt LB/hip tightness     Neck Exercises: Prone   Axial Exension 5 reps   with scap squeeze thoracic ext 5 sec hold    Other Prone Exercise prone series - arms at side; T's; W's; airplane; superman 5 sec x 5 reps each       Shoulder Exercises: ROM/Strengthening   UBE (Upper Arm Bike) L2 x 1.5 minutes forward/1.5 back       Shoulder Exercises: Stretch   Other Shoulder Stretches doorway stretch 30 sec x 2 reps each position     Other Shoulder Stretches nerve stretch  30-60 sec x 2 reps each side       Moist Heat Therapy   Number Minutes Moist Heat 15 Minutes    Moist Heat Location Cervical   thoracic      Manual Therapy   Manual therapy comments to improve joint mobility and reduce pain    Joint Mobilization supine thoracic and cervical PA mobs     Soft tissue mobilization supine - ant/lat/post cervical musculature; upper trap; pecs bilat     Manual Traction x 20 second hold x 3 reps    Other Manual Therapy occipital inhibition                   PT Education - 01/09/20 1417    Education Details HEP    Person(s) Educated Patient    Methods Explanation;Demonstration;Tactile cues;Verbal cues;Handout    Comprehension Verbalized understanding;Returned demonstration;Verbal cues required;Tactile cues required               PT Long Term Goals - 12/26/19 1502      PT LONG TERM GOAL #1   Title Improve posture and alignment with patient  to demonstrate improved upright posture with posterior shoulder girdle engaged    Time 6    Period Weeks    Status New    Target Date 02/05/20      PT LONG TERM GOAL #2   Title Decrease cervical pain with functional activities with patient to report no more than 2-3/10 with looking up    Time 6    Period Weeks    Status New    Target Date 02/05/20      PT LONG TERM GOAL #3   Title Increase cervical ROM in extension by 15-20 degrees and Lt lateral flexion/Lt rotation by 3-5 degrees with all cervical motions pain free    Time 6    Period Weeks    Status New    Target Date 02/05/20      PT LONG TERM GOAL #4   Title Independent in HEP    Time 6    Period Weeks    Status New    Target Date 02/05/20      PT LONG TERM GOAL #5   Title Improve FOTO to </= 34% limitation    Time 6    Period Weeks    Status New    Target Date 02/05/20                 Plan - 01/09/20 1402    Clinical Impression Statement Some improvement in tightness per pt report. Added stretches for Rt lumbar and  pirifromis areas or tightness. Continued strengthening for posterior shoulder girdle; added neural mobilization in clinic and for home; continue with manual work through cervical musculature.    Rehab Potential Good    PT Frequency 2x / week    PT Duration 6 weeks    PT Treatment/Interventions ADLs/Self Care Home Management;Aquatic Therapy;Cryotherapy;Electrical Stimulation;Iontophoresis 27m/ml Dexamethasone;Moist Heat;Ultrasound;Functional mobility training;Therapeutic activities;Therapeutic exercise;Balance training;Neuromuscular re-education;Patient/family education;Manual techniques;Dry needling;Taping    PT Next Visit Plan Review HEP; continue with postural correction and education; manual work (no DN!); stretch pecs; strengthening posterior shoulder girdle musculature; modalities as indicated    PT Home Exercise Plan [email or web address]    Consulted and Agree with Plan of Care Patient           Patient will benefit from skilled therapeutic intervention in order to improve the following deficits and impairments:     Visit Diagnosis: Cervicalgia  Cervical dysfunction  Abnormal posture  Other symptoms and signs involving the musculoskeletal system     Problem List Patient Active Problem List   Diagnosis Date Noted  . Cervical pain 12/14/2019  . Facial trauma 12/03/2019  . Poor sleep pattern 11/16/2019  . Chronic insomnia 09/20/2019  . Gastroesophageal reflux disease 05/16/2019  . Hypothyroidism 03/19/2019  . Hyperlipidemia 03/19/2019  . Essential hypertension 03/19/2019  . Facet syndrome, lumbar 03/19/2019  . Menorrhagia 03/19/2019  . 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 Lichen sclerosus 032/95/1884 . Upper airway cough syndrome 03/19/2019  . IUD (intrauterine device) in place 12/06/2016  . Lesion of oral mucosa 08/01/2014  . Depression 05/10/2011    [name], MPH  01/09/2020, 5:37 PM  [facility] [address]Phone: [phone]  Fax:  [phone] Name: April WITTKEMRN: [id number] of Birth: 609-Oct-1977

## 2020-01-17 MED FILL — PHENTERMINE HCL 15 MG CAPS: 15 | 30 days supply | Qty: 30 | Fill #1

## 2020-01-21 ENCOUNTER — Other Ambulatory Visit: Payer: Self-pay

## 2020-01-21 ENCOUNTER — Ambulatory Visit (INDEPENDENT_AMBULATORY_CARE_PROVIDER_SITE_OTHER): Payer: No Typology Code available for payment source | Admitting: Rehabilitative and Restorative Service Providers"

## 2020-01-21 ENCOUNTER — Encounter: Payer: Self-pay | Admitting: Rehabilitative and Restorative Service Providers"

## 2020-01-21 DIAGNOSIS — M542 Cervicalgia: Secondary | ICD-10-CM

## 2020-01-21 DIAGNOSIS — R293 Abnormal posture: Secondary | ICD-10-CM | POA: Diagnosis not present

## 2020-01-21 DIAGNOSIS — R29898 Other symptoms and signs involving the musculoskeletal system: Secondary | ICD-10-CM

## 2020-01-21 DIAGNOSIS — M539 Dorsopathy, unspecified: Secondary | ICD-10-CM

## 2020-01-21 NOTE — Patient Instructions (Addendum)
[email or web address] Code: [email or web address]: [email or web address]: 11/08/2021Prepared by: Ary Rudnick HoltExercises  Doorway Pec Stretch at 60 Degrees Abduction - 3 x daily - 7 x weekly - 3 reps - 1 sets  Doorway Pec Stretch at 90 Degrees Abduction - 3 x daily - 7 x weekly - 3 reps - 1 sets - 30 seconds hold  Doorway Pec Stretch at 120 Degrees Abduction - 3 x daily - 7 x weekly - 3 reps - 1 sets - 30 second hold hold  Seated Cervical Retraction - 2 x daily - 7 x weekly - 1-2 sets - 5-10 reps - 10 sec hold  Standing Anatomical Position with Scapular Retraction and Depression at Wall - 2 x daily - 7 x weekly - 1 sets - 10 reps  Shoulder External Rotation and Scapular Retraction - 2 x daily - 7 x weekly - 1 sets - 10 reps - 5 sec hold  Shoulder External Rotation in 45 Degrees Abduction - 2 x daily - 7 x weekly - 1-2 sets - 10 reps - 3 sec hold  Standing Isometric Cervical Sidebending with Manual Resistance - 2 x daily - 7 x weekly - 1 sets - 5 reps - 5 seconds hold  Thoracic Extension Mobilization with Noodle - 2 x daily - 7 x weekly - 1 sets - 3 reps - 30 sec hold  Prone Upper Back Extension Off Table with Hands Behind Head - 2 x daily - 7 x weekly - 1 sets - 5-10 reps - 5-10 sec hold  Supine Scapular Retraction - 2 x daily - 7 x weekly - 1 sets - 10 reps - 5-10 sec hold  Prone Shoulder Extension - 2 x daily - 7 x weekly - 1 sets - 3 reps - 30 sec hold  Prone Chest Lift with Arms Overhead - 2 x daily - 7 x weekly - 1 sets - 3 reps - 30 sec hold  Supine Piriformis Stretch with Leg Straight - 2 x daily - 7 x weekly - 1 sets - 3 reps - 30 sec hold  Standing Shoulder External Rotation with Resistance - 2 x daily - 7 x weekly - 1-3 sets - 10 reps - 2-3 sec hold  Standing Bilateral Low Shoulder Row with Anchored Resistance - 2 x daily - 7 x weekly - 1-3 sets - 10 reps - 2-3 sec hold  Shoulder Extension with Resistance - 2 x daily - 7 x weekly - 1-3 sets - 10 reps - 2-3 sec hold

## 2020-01-21 NOTE — Therapy (Signed)
April Webster, [place], [address] Phone: [phone]   Fax:  [phone]  Physical Therapy Treatment  Patient Details  Name: April Webster MRN: [id number] Date of Birth: 08/14/1975 Referring Provider (PT): Dr Beatrice Lecher    Encounter Date: 01/21/2020   PT End of Session - 01/21/20 0720    Visit Number 5    Number of Visits 12    Date for PT Re-Evaluation 02/06/20    PT Start Time 0717    PT Stop Time 0805    PT Time Calculation (min) 48 min    Activity Tolerance Patient tolerated treatment well           History reviewed. No pertinent past medical history.  Past Surgical History:  Procedure Laterality Date  . ABDOMINOPLASTY    . PLACEMENT OF BREAST IMPLANTS      There were no vitals filed for this visit.   Subjective Assessment - 01/21/20 0720    Subjective Patient reports that she has increased pain over the past few days. Not sure what she did that may have irritated symptoms. Having pain in the neck and shoulder as well as in the Rt LB and hip area    Currently in Pain? Yes    Pain Score 7     Pain Location Neck    Pain Orientation Right;Lower    Pain Descriptors / Indicators Aching;Burning    Pain Type Acute pain;Chronic pain              [facility] PT Assessment - 01/21/20 0001      Assessment   Medical Diagnosis Cervical dysfunction     Referring Provider (PT) Dr Beatrice Lecher     Onset Date/Surgical Date 11/28/19    Hand Dominance Right    Next MD Visit PRN    Prior Therapy yes for LBP       Palpation   Palpation comment muscular tightness in the ant/lat/posterior cervical musculature; pecs; upper traps; thoracic paraspinals bilat - Rt > Lt       Special Tests   Other special tests (+) neural tension test Rt > Lt UE's                          [facility] Adult PT Treatment/Exercise - 01/21/20 0001      Neck Exercises: Supine   Neck Retraction 5  reps;5 secs      Shoulder Exercises: Standing   Extension Strengthening;Both;20 reps;Theraband    Theraband Level (Shoulder Extension) Level 3 (Green)    Row Strengthening;Both;20 reps;Theraband    Theraband Level (Shoulder Row) Level 3 (Green)    Retraction Strengthening;Both;20 reps;Theraband    Theraband Level (Shoulder Retraction) Level 2 (Red)      Shoulder Exercises: ROM/Strengthening   UBE (Upper Arm Bike) L2 x 4 minutes forward/backward alternating      Shoulder Exercises: Stretch   Other Shoulder Stretches doorway stretch 30 sec x 2 reps each position     Other Shoulder Stretches nerve stretch 30-60 sec x 2 reps each side       Moist Heat Therapy   Number Minutes Moist Heat 10 Minutes    Moist Heat Location Cervical   thoracic      Manual Therapy   Manual therapy comments to improve joint mobility and reduce pain    Soft tissue mobilization supine - ant/lat/post cervical musculature; upper trap; pecs bilat     Passive  ROM passive cervical flexion; cervical flexion with rotation 10 sec hold x 3 reps     Manual Traction x 20 second hold x 3 reps    Other Manual Therapy occipital inhibition       Neck Exercises: Stretches   Other Neck Stretches lateral cervical 10- 15 sec x 3 reps                   PT Education - 01/21/20 0737    Education Details HEP    Person(s) Educated Patient    Methods Explanation;Demonstration;Tactile cues;Verbal cues;Handout    Comprehension Verbalized understanding;Returned demonstration;Verbal cues required;Tactile cues required               PT Long Term Goals - 12/26/19 1502      PT LONG TERM GOAL #1   Title Improve posture and alignment with patient to demonstrate improved upright posture with posterior shoulder girdle engaged    Time 6    Period Weeks    Status New    Target Date 02/05/20      PT LONG TERM GOAL #2   Title Decrease cervical pain with functional activities with patient to report no more than 2-3/10  with looking up    Time 6    Period Weeks    Status New    Target Date 02/05/20      PT LONG TERM GOAL #3   Title Increase cervical ROM in extension by 15-20 degrees and Lt lateral flexion/Lt rotation by 3-5 degrees with all cervical motions pain free    Time 6    Period Weeks    Status New    Target Date 02/05/20      PT LONG TERM GOAL #4   Title Independent in HEP    Time 6    Period Weeks    Status New    Target Date 02/05/20      PT LONG TERM GOAL #5   Title Improve FOTO to </= 34% limitation    Time 6    Period Weeks    Status New    Target Date 02/05/20                 Plan - 01/21/20 0350    Clinical Impression Statement Increase in pain and tightness today. Patient continues to work on exercises. "here and there". She can feel the nerve stretch but tissue does relasx into the stretch. Note continued muscular tightness. Added TB exercises. Persistent symptoms with variable intensity.    Rehab Potential Good    PT Frequency 2x / week    PT Duration 6 weeks    PT Treatment/Interventions ADLs/Self Care Home Management;Aquatic Therapy;Cryotherapy;Electrical Stimulation;Iontophoresis 59m/ml Dexamethasone;Moist Heat;Ultrasound;Functional mobility training;Therapeutic activities;Therapeutic exercise;Balance training;Neuromuscular re-education;Patient/family education;Manual techniques;Dry needling;Taping    PT Next Visit Plan Review HEP; continue with postural correction and education; manual work (no DN!); stretch pecs; strengthening posterior shoulder girdle musculature; modalities as indicated    PT Home Exercise Plan [email or web address]    Consulted and Agree with Plan of Care Patient           Patient will benefit from skilled therapeutic intervention in order to improve the following deficits and impairments:     Visit Diagnosis: Cervicalgia  Cervical dysfunction  Abnormal posture  Other symptoms and signs involving the musculoskeletal system     Problem  List Patient Active Problem List   Diagnosis Date Noted  . Cervical pain 12/14/2019  . Facial trauma 12/03/2019  .  Poor sleep pattern 11/16/2019  . Chronic insomnia 09/20/2019  . Gastroesophageal reflux disease 05/16/2019  . Hypothyroidism 03/19/2019  . Hyperlipidemia 03/19/2019  . Essential hypertension 03/19/2019  . Facet syndrome, lumbar 03/19/2019  . Menorrhagia 03/19/2019  . 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 Lichen sclerosus 75/12/2583  . Upper airway cough syndrome 03/19/2019  . IUD (intrauterine device) in place 12/06/2016  . Lesion of oral mucosa 08/01/2014  . Depression 05/10/2011    Pari Lombard Nilda Simmer PT, MPH  01/21/2020, 8:00 AM  [facility] [place], [place], [address] Phone: [phone]   Fax:  [phone]  Name: April Webster MRN: [id number] Date of Birth: 12/28/1975

## 2020-01-24 ENCOUNTER — Encounter: Payer: Self-pay | Admitting: Rehabilitative and Restorative Service Providers"

## 2020-01-24 ENCOUNTER — Ambulatory Visit (INDEPENDENT_AMBULATORY_CARE_PROVIDER_SITE_OTHER): Payer: No Typology Code available for payment source | Admitting: Rehabilitative and Restorative Service Providers"

## 2020-01-24 ENCOUNTER — Other Ambulatory Visit: Payer: Self-pay

## 2020-01-24 DIAGNOSIS — M542 Cervicalgia: Secondary | ICD-10-CM | POA: Diagnosis not present

## 2020-01-24 DIAGNOSIS — R29898 Other symptoms and signs involving the musculoskeletal system: Secondary | ICD-10-CM

## 2020-01-24 DIAGNOSIS — M539 Dorsopathy, unspecified: Secondary | ICD-10-CM | POA: Diagnosis not present

## 2020-01-24 DIAGNOSIS — R293 Abnormal posture: Secondary | ICD-10-CM

## 2020-01-24 NOTE — Therapy (Signed)
[place], [place], [address] Phone: [phone]   Fax:  [phone]  Physical Therapy Treatment  Patient Details  Name: April Webster MRN: [id number] Date of Birth: Jun 11, 1975 Referring Provider (PT): Dr Beatrice Lecher    Encounter Date: 01/24/2020   PT End of Session - 01/24/20 1448    Visit Number 6    Number of Visits 12    Date for PT Re-Evaluation 02/06/20    PT Start Time 7494    PT Stop Time 1535    PT Time Calculation (min) 48 min    Activity Tolerance Patient tolerated treatment well           History reviewed. No pertinent past medical history.  Past Surgical History:  Procedure Laterality Date  . ABDOMINOPLASTY    . PLACEMENT OF BREAST IMPLANTS      There were no vitals filed for this visit.   Subjective Assessment - 01/24/20 1449    Subjective Patient reports that she has generalized soreness and pain with certain movements - looking up is the worst. Having cold fingers and tingling in the hands. Questions symptoms of Raynaud's Disease?    Currently in Pain? Yes    Pain Score 5     Pain Location Neck    Pain Orientation Right;Lower    Pain Descriptors / Indicators Aching;Burning    Pain Type Acute pain              [facility] PT Assessment - 01/24/20 0001      Assessment   Medical Diagnosis Cervical dysfunction     Referring Provider (PT) Dr Beatrice Lecher     Onset Date/Surgical Date 11/28/19    Hand Dominance Right    Next MD Visit PRN    Prior Therapy yes for LBP       AROM   Cervical Extension 48 with pain to mid back      Palpation   Palpation comment muscular tightness in the ant/lat/posterior cervical musculature; pecs; upper traps; thoracic paraspinals bilat - Rt > Lt                          [facility] Adult PT Treatment/Exercise - 01/24/20 0001      Ambulation/Gait   Gait velocity - backwards        Therapeutic Activites     Other Therapeutic Activities myofacial ball release upper thoracic spine with massage blocks for prolonged release       Shoulder Exercises: Standing   External Rotation Strengthening;Both;10 reps    Extension Strengthening;Both;20 reps;Theraband    Theraband Level (Shoulder Extension) Level 3 (Green)    Row Strengthening;Both;20 reps;Theraband    Theraband Level (Shoulder Row) Level 3 (Green)    Retraction Strengthening;Both;20 reps;Theraband    Theraband Level (Shoulder Retraction) Level 2 (Red)      Shoulder Exercises: ROM/Strengthening   UBE (Upper Arm Bike) L2 x 4 minutes forward/backward alternating      Moist Heat Therapy   Number Minutes Moist Heat 10 Minutes    Moist Heat Location Cervical   thoracic      Manual Therapy   Manual therapy comments to improve joint mobility and reduce pain    Joint Mobilization AP mobs through upper cervical spine; PA mobs lower thoracic spine pt supine     Soft tissue mobilization supine - ant/lat/post cervical musculature; upper trap; pecs bilat     Passive ROM passive  cervical extension    Manual Traction x 20 second hold x several reps    Other Manual Therapy occipital inhibition                        PT Long Term Goals - 12/26/19 1502      PT LONG TERM GOAL #1   Title Improve posture and alignment with patient to demonstrate improved upright posture with posterior shoulder girdle engaged    Time 6    Period Weeks    Status New    Target Date 02/05/20      PT LONG TERM GOAL #2   Title Decrease cervical pain with functional activities with patient to report no more than 2-3/10 with looking up    Time 6    Period Weeks    Status New    Target Date 02/05/20      PT LONG TERM GOAL #3   Title Increase cervical ROM in extension by 15-20 degrees and Lt lateral flexion/Lt rotation by 3-5 degrees with all cervical motions pain free    Time 6    Period Weeks    Status New    Target Date 02/05/20      PT LONG TERM GOAL  #4   Title Independent in HEP    Time 6    Period Weeks    Status New    Target Date 02/05/20      PT LONG TERM GOAL #5   Title Improve FOTO to </= 34% limitation    Time 6    Period Weeks    Status New    Target Date 02/05/20                 Plan - 01/24/20 1455    Clinical Impression Statement Continued pain and tightness in the cervical area. Trial of cervical mobs working into axial extension. Continued muscular tightness through the cervical and shoulder girdle area. Excellent increase in cervical ROM with mobilization and passive stretch into extension head supported off edge of table.    Rehab Potential Good    PT Frequency 2x / week    PT Duration 6 weeks    PT Treatment/Interventions ADLs/Self Care Home Management;Aquatic Therapy;Cryotherapy;Electrical Stimulation;Iontophoresis 32m/ml Dexamethasone;Moist Heat;Ultrasound;Functional mobility training;Therapeutic activities;Therapeutic exercise;Balance training;Neuromuscular re-education;Patient/family education;Manual techniques;Dry needling;Taping    PT Next Visit Plan Review HEP; continue with postural correction and education; manual work (no DN!); stretch pecs; strengthening posterior shoulder girdle musculature; modalities as indicated - assess response to AP mobs cervical spine    PT Home Exercise Plan [email or web address]    Consulted and Agree with Plan of Care Patient           Patient will benefit from skilled therapeutic intervention in order to improve the following deficits and impairments:     Visit Diagnosis: Cervicalgia  Cervical dysfunction  Abnormal posture  Other symptoms and signs involving the musculoskeletal system     Problem List Patient Active Problem List   Diagnosis Date Noted  . Cervical pain 12/14/2019  . Facial trauma 12/03/2019  . Poor sleep pattern 11/16/2019  . Chronic insomnia 09/20/2019  . Gastroesophageal reflux disease 05/16/2019  . Hypothyroidism 03/19/2019  . Hyperlipidemia  03/19/2019  . Essential hypertension 03/19/2019  . Facet syndrome, lumbar 03/19/2019  . Menorrhagia 03/19/2019  . 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 Lichen sclerosus 082/99/3716 . Upper airway cough syndrome 03/19/2019  . IUD (intrauterine device) in place 12/06/2016  .  Lesion of oral mucosa 08/01/2014  . Depression 05/10/2011    April Safran Nilda Simmer PT, MPH  01/24/2020, 3:33 PM  [facility] [place], [place], [address] Phone: [phone]   Fax:  [phone]  Name: April Webster MRN: [id number] Date of Birth: 11/29/1975

## 2020-01-29 ENCOUNTER — Other Ambulatory Visit: Payer: Self-pay

## 2020-01-29 ENCOUNTER — Other Ambulatory Visit: Payer: Self-pay | Admitting: Sports Medicine

## 2020-01-29 ENCOUNTER — Ambulatory Visit (INDEPENDENT_AMBULATORY_CARE_PROVIDER_SITE_OTHER): Payer: No Typology Code available for payment source

## 2020-01-29 ENCOUNTER — Ambulatory Visit (INDEPENDENT_AMBULATORY_CARE_PROVIDER_SITE_OTHER): Payer: No Typology Code available for payment source | Admitting: Sports Medicine

## 2020-01-29 DIAGNOSIS — G8929 Other chronic pain: Secondary | ICD-10-CM

## 2020-01-29 DIAGNOSIS — M47816 Spondylosis without myelopathy or radiculopathy, lumbar region: Secondary | ICD-10-CM | POA: Diagnosis not present

## 2020-01-29 DIAGNOSIS — M5459 Other low back pain: Secondary | ICD-10-CM | POA: Diagnosis not present

## 2020-01-29 DIAGNOSIS — M25552 Pain in left hip: Secondary | ICD-10-CM | POA: Diagnosis not present

## 2020-01-29 DIAGNOSIS — M79605 Pain in left leg: Secondary | ICD-10-CM | POA: Diagnosis not present

## 2020-01-29 IMAGING — DX DG FEMUR 2+V*L*
2 series · 2 of 2 positions shown · non-contrast
Comparison: None.

CLINICAL DATA: Pain of distal femoral metaphysis for 1 week, left
anterior hip pain for 1 day, no history of trauma

EXAM:
LEFT FEMUR 2 VIEWS; DG HIP (WITH OR WITHOUT PELVIS) 2-3V LEFT

[femur ap]
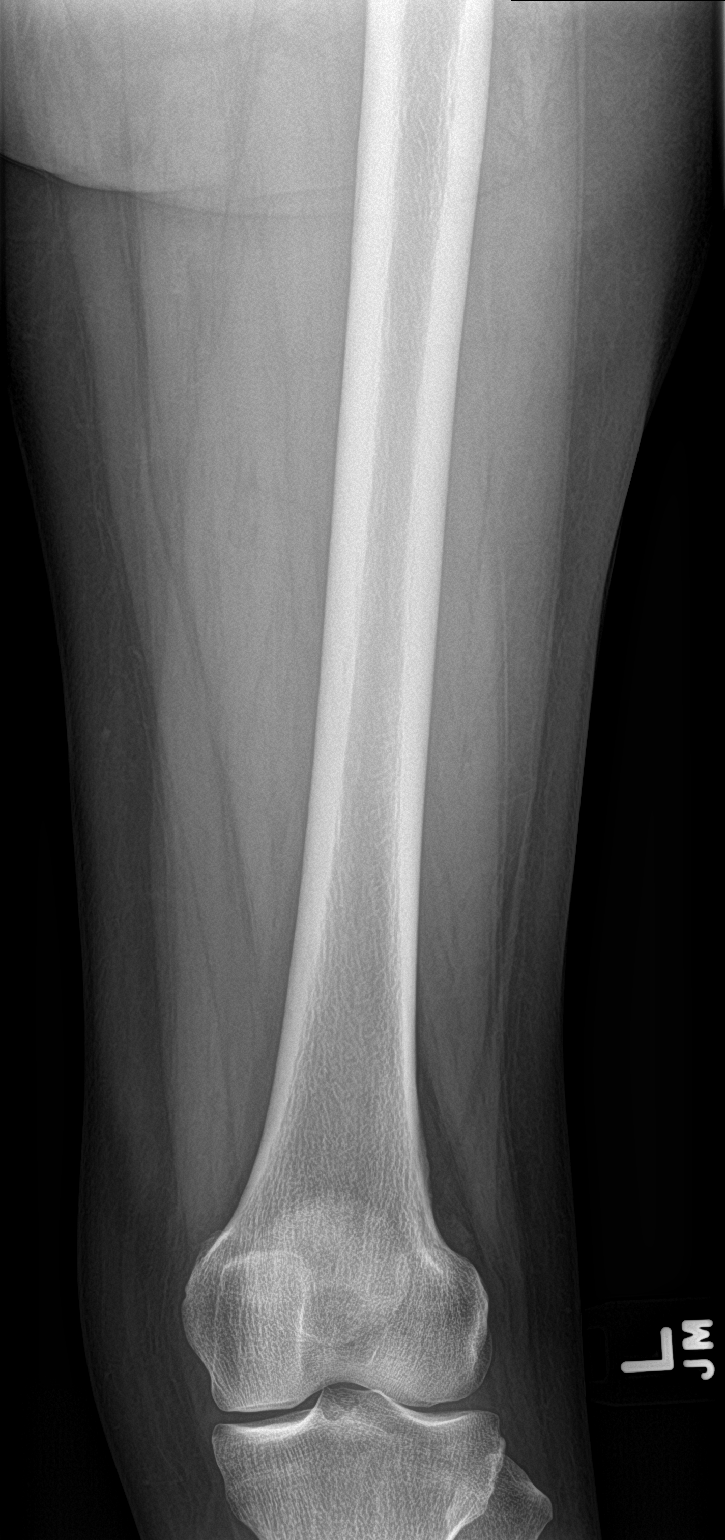

[femur lat]
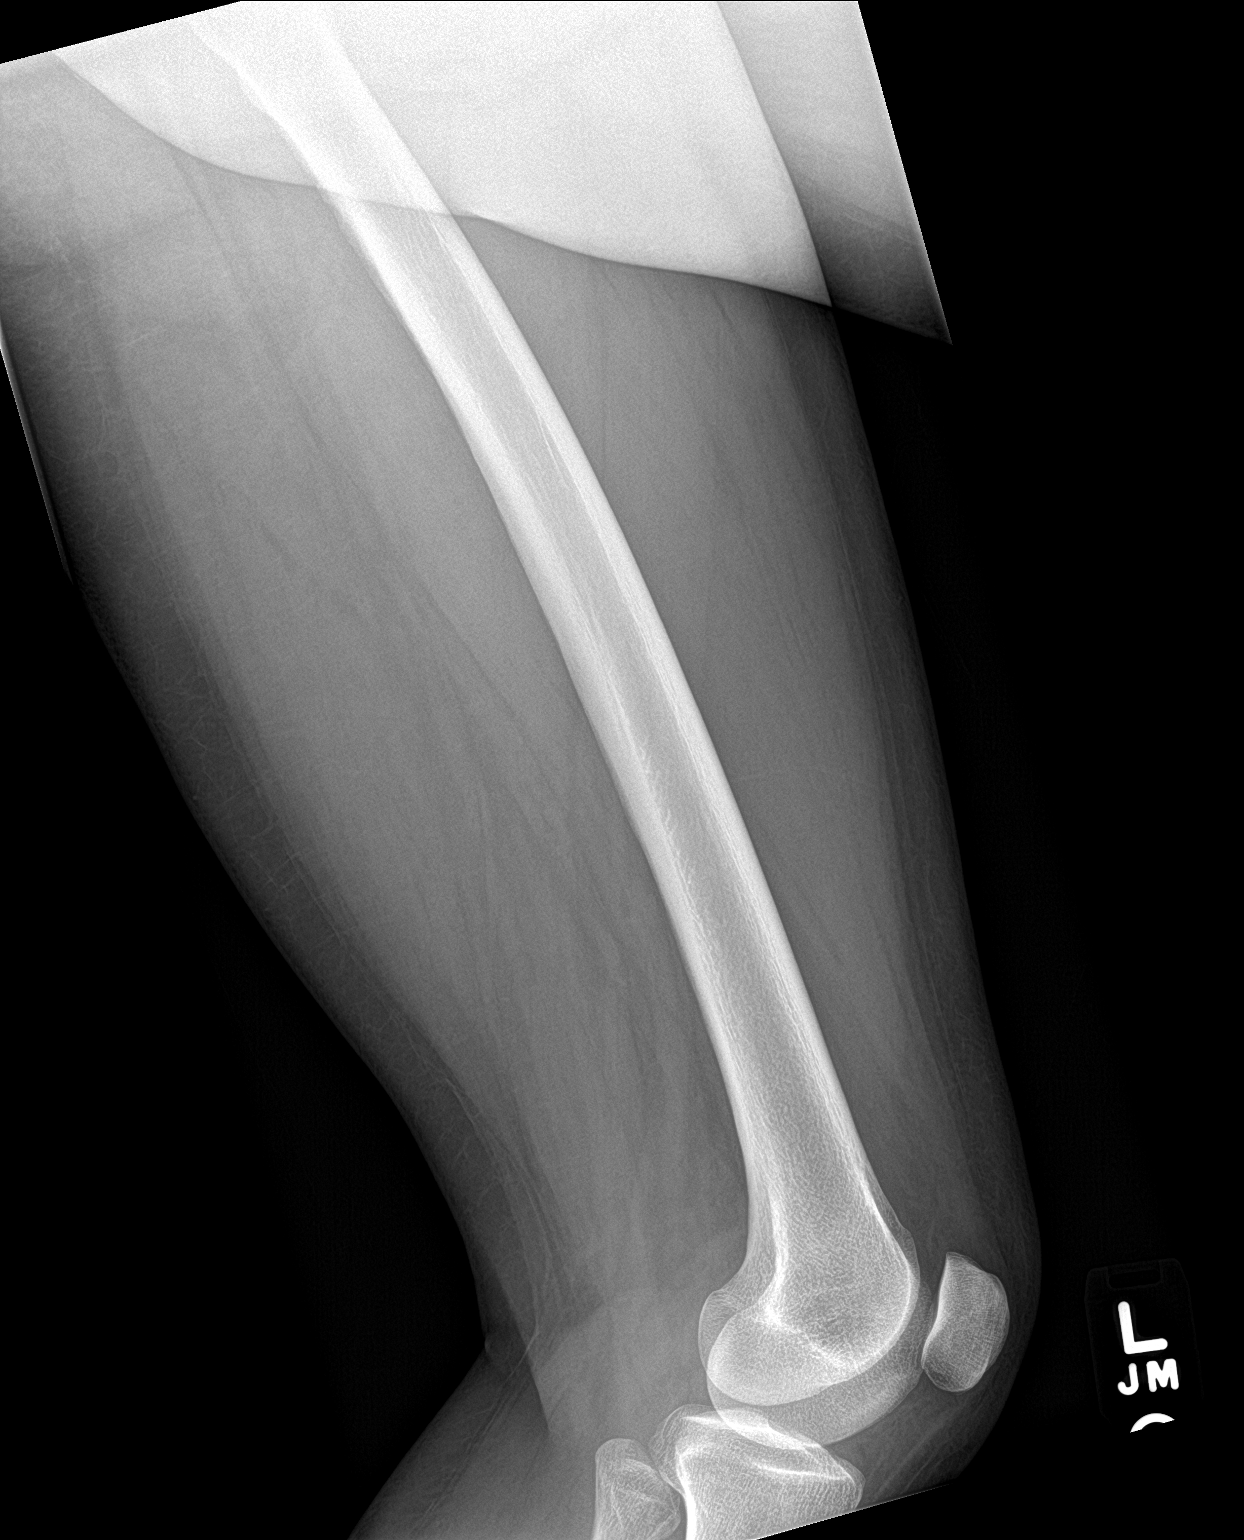

[2 of 2 positions shown; findings below may reference images not displayed]

FINDINGS: Left hip: Frontal view of the pelvis as well as frontal and frogleg
lateral views of the left hip are obtained. No acute displaced
fracture. Joint spaces are well preserved. IUD overlies the pelvis.

Left femur: Frontal and lateral views of the mid to distal left
femur are obtained. Proximal femur is included on the corresponding
hip x-ray. No fractures. Alignment is anatomic. Joint spaces of the
left knee are well preserved. Soft tissues are normal.
IMPRESSION: 1. Unremarkable left hip and left femur.

## 2020-01-29 IMAGING — DX DG LUMBAR SPINE COMPLETE 4+V
5 series · 5 of 5 positions shown · non-contrast
Comparison: None.

CLINICAL DATA: Left anterior hip pain for 1 day, lower back pain
for several years, no history of trauma

EXAM:
LUMBAR SPINE - COMPLETE 4+ VIEW

[l-spine ap]
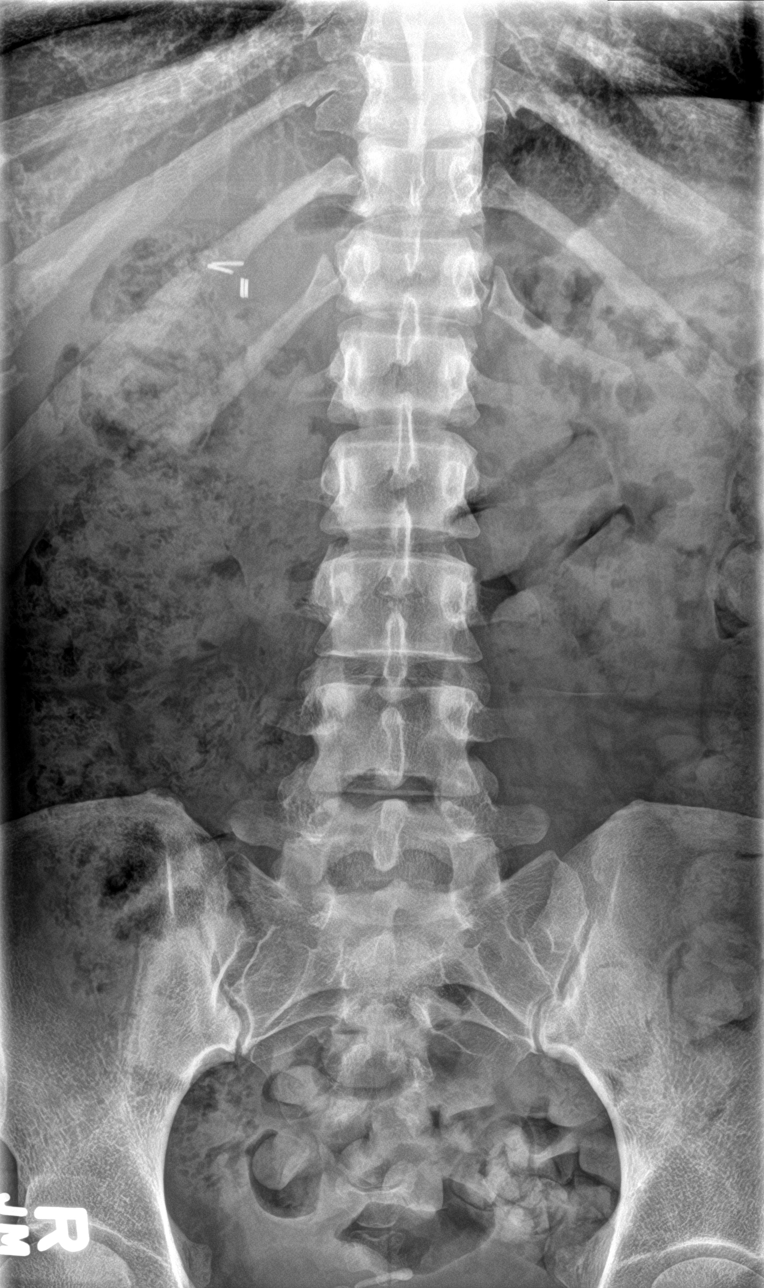

[l-spine obl (1 of 2)]
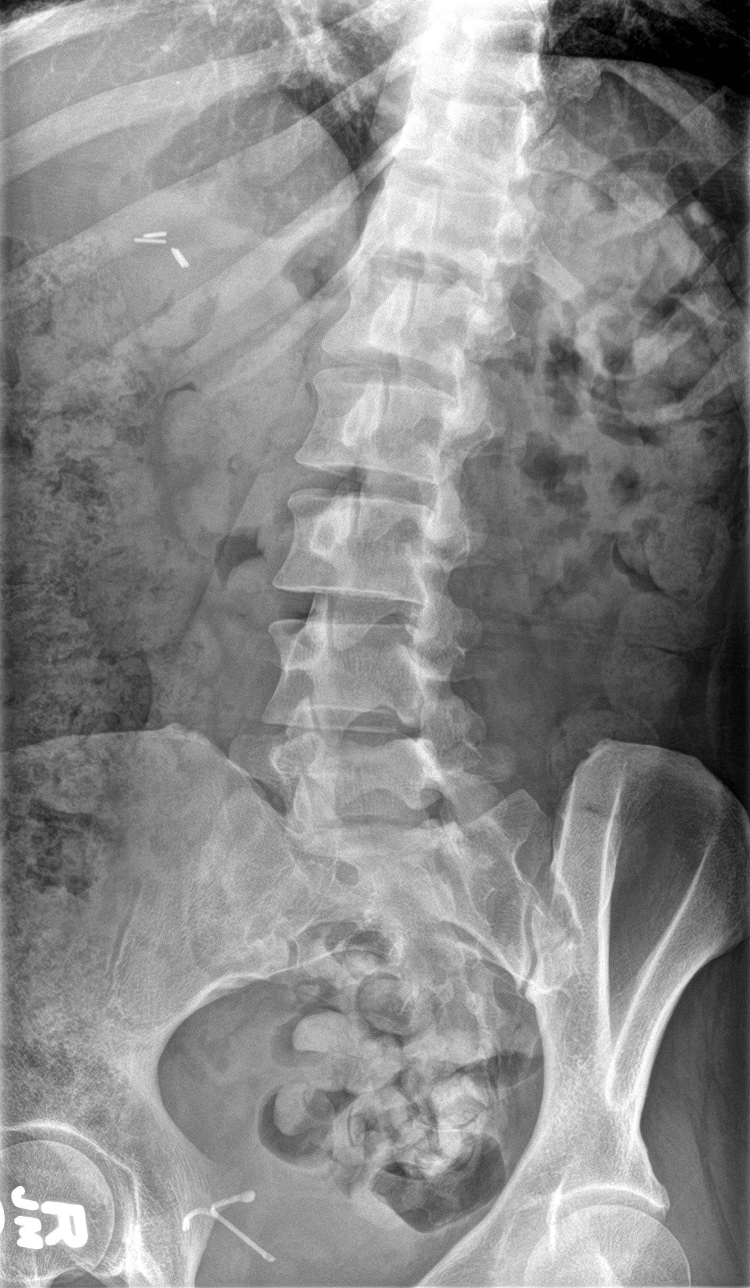

[l-spine obl (2 of 2)]
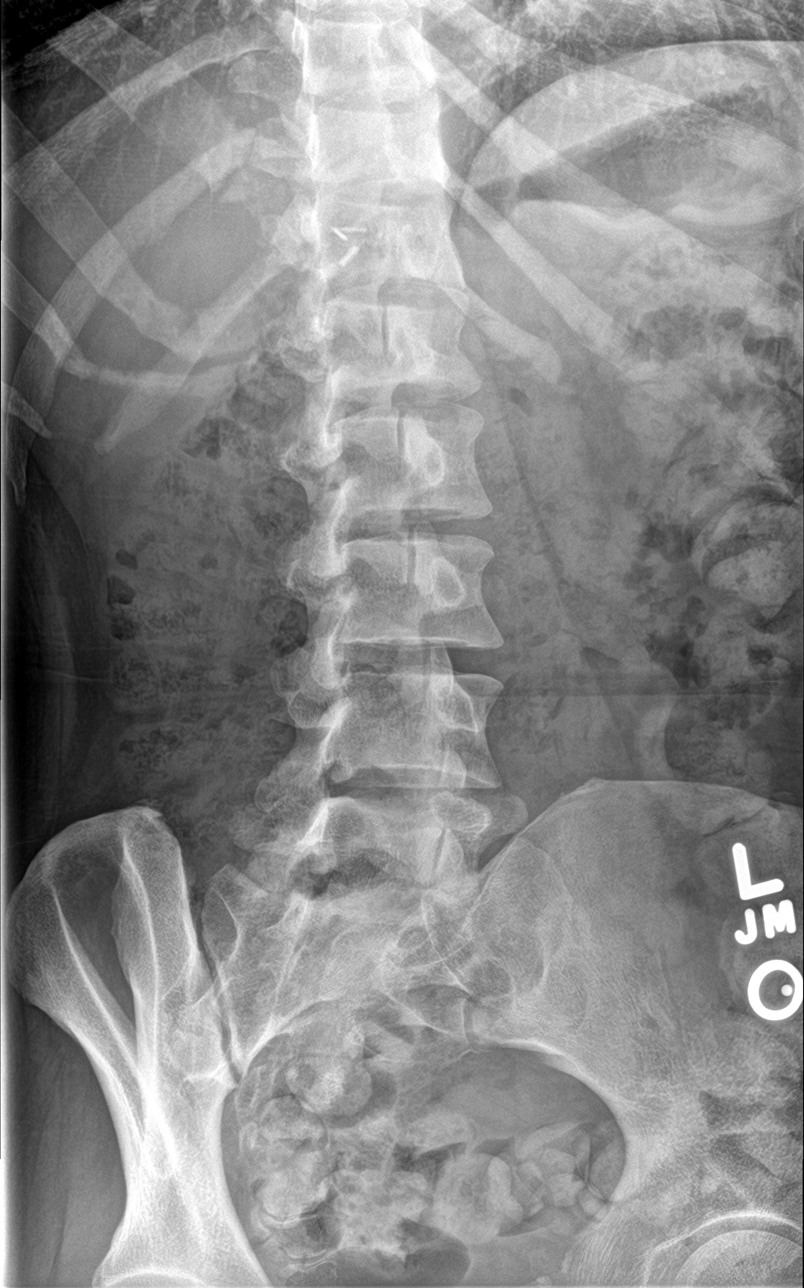

[l-spine lat]
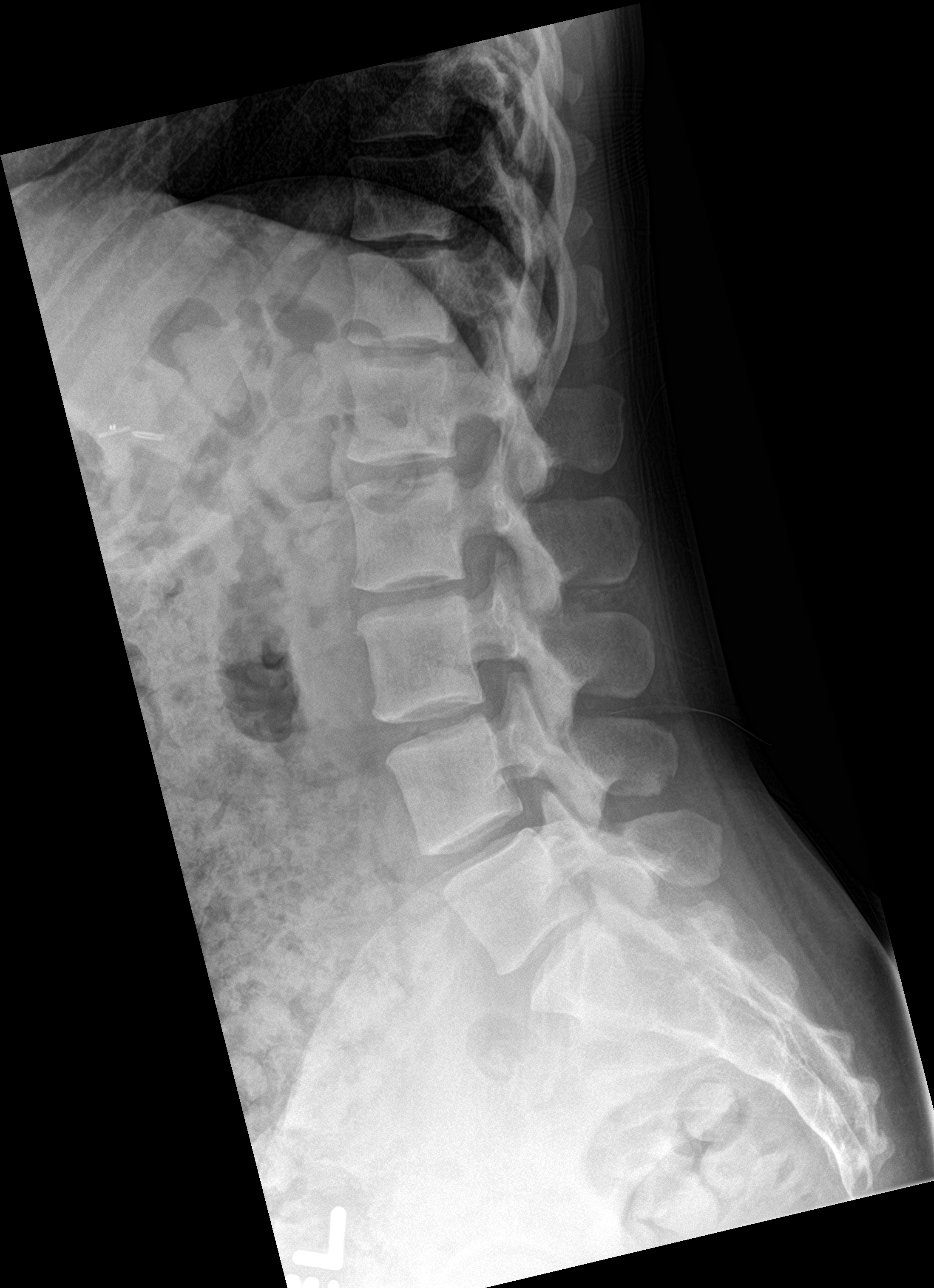

[l-spine spot]
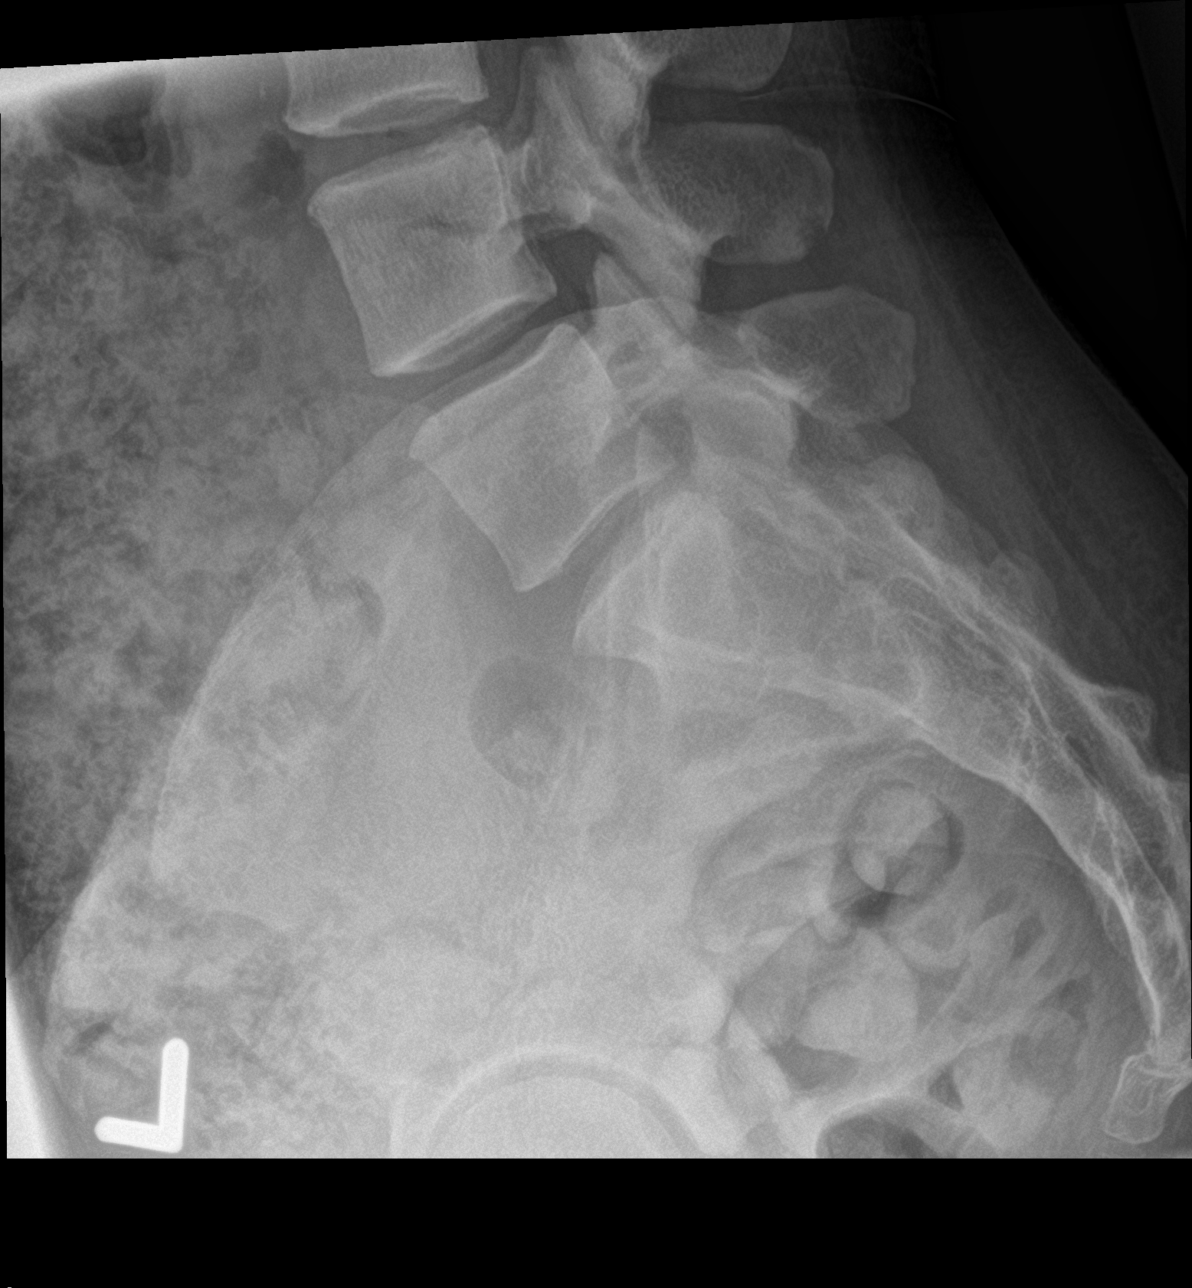

[5 of 5 positions shown; findings below may reference images not displayed]

FINDINGS: Frontal, bilateral oblique, lateral views of the lumbar spine are
obtained. There are 5 non-rib-bearing lumbar type vertebral bodies
in grossly normal alignment. No fractures. Disc spaces are well
preserved. Sacroiliac joints are normal. Cholecystectomy clips right
upper quadrant. Moderate retained stool.
IMPRESSION: 1. Unremarkable lumbar spine.

## 2020-01-29 IMAGING — DX DG HIP (WITH OR WITHOUT PELVIS) 2-3V*L*
3 series · 3 of 3 positions shown · non-contrast
Comparison: None.

CLINICAL DATA: Pain of distal femoral metaphysis for 1 week, left
anterior hip pain for 1 day, no history of trauma

EXAM:
LEFT FEMUR 2 VIEWS; DG HIP (WITH OR WITHOUT PELVIS) 2-3V LEFT

[pelvis ap]
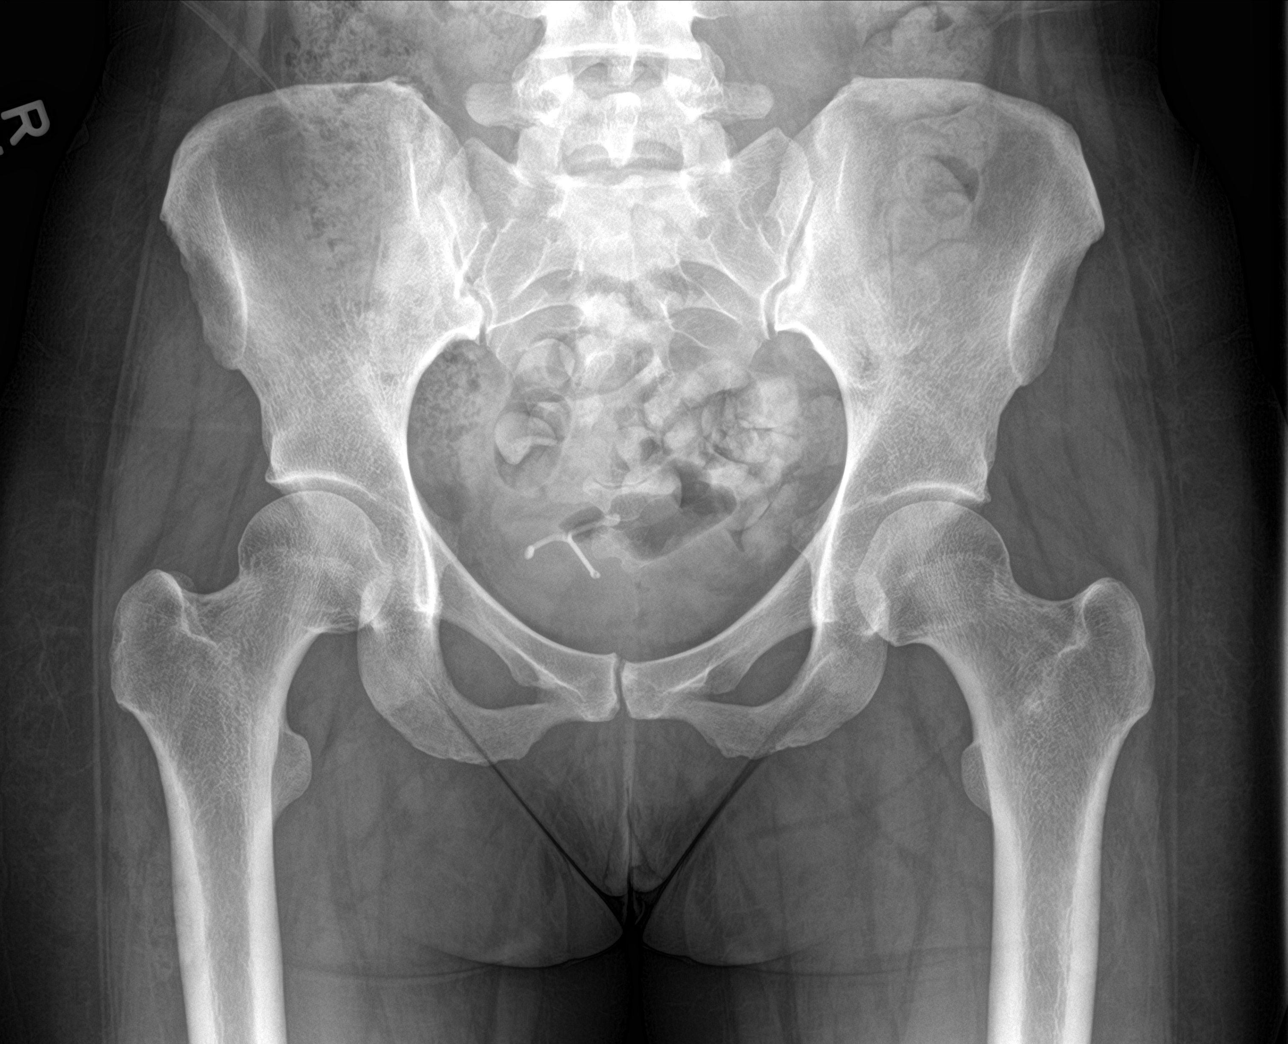

[hip ap]
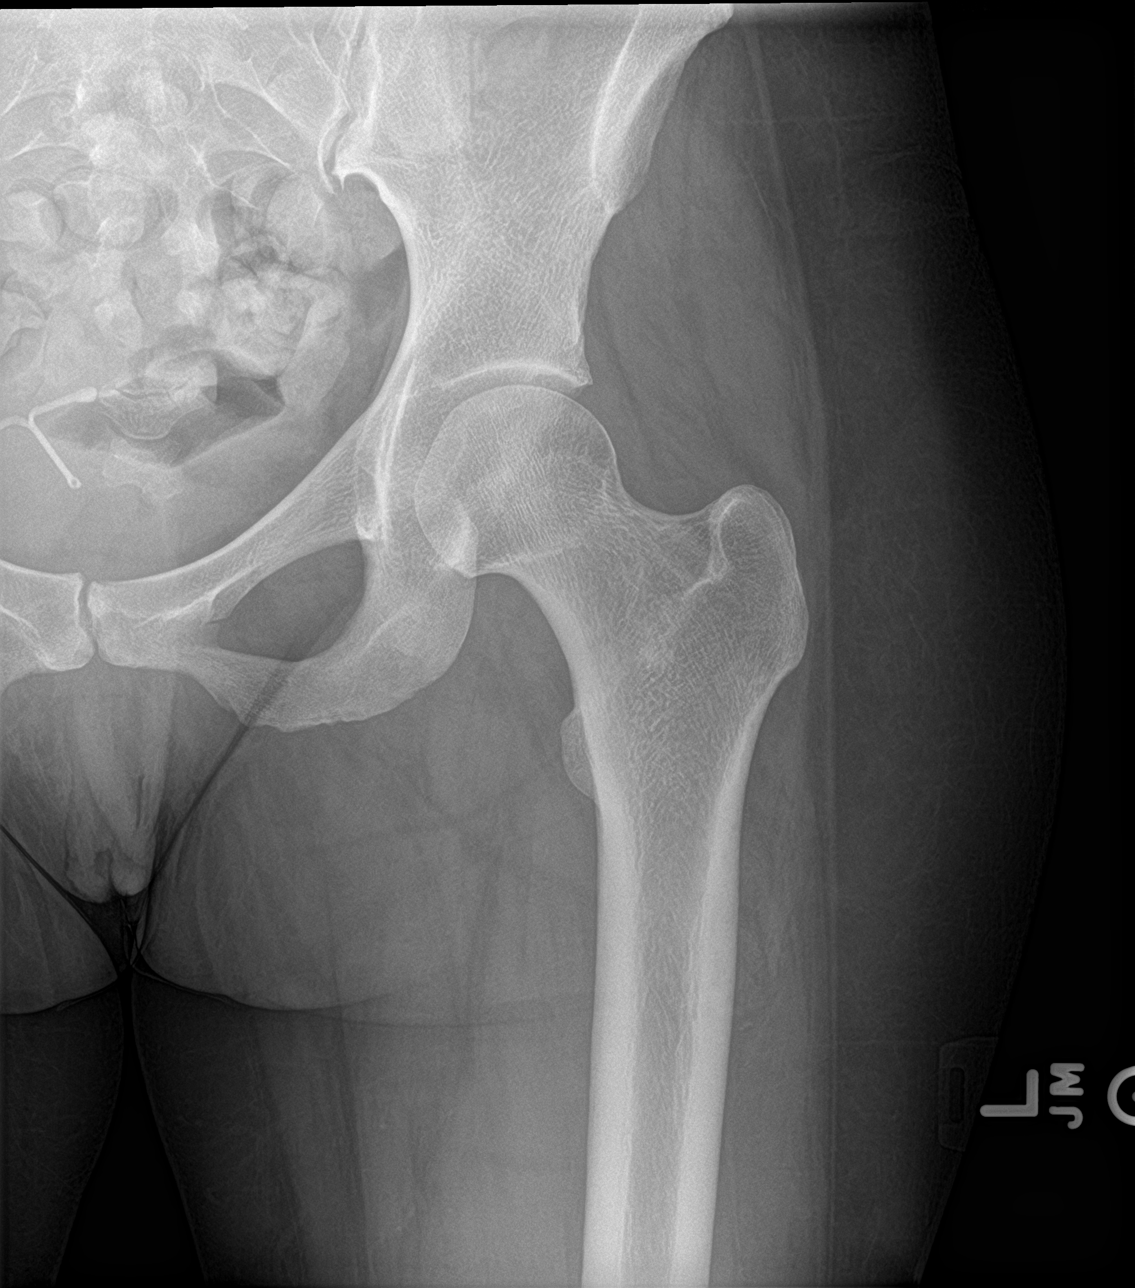

[hip frog leg]
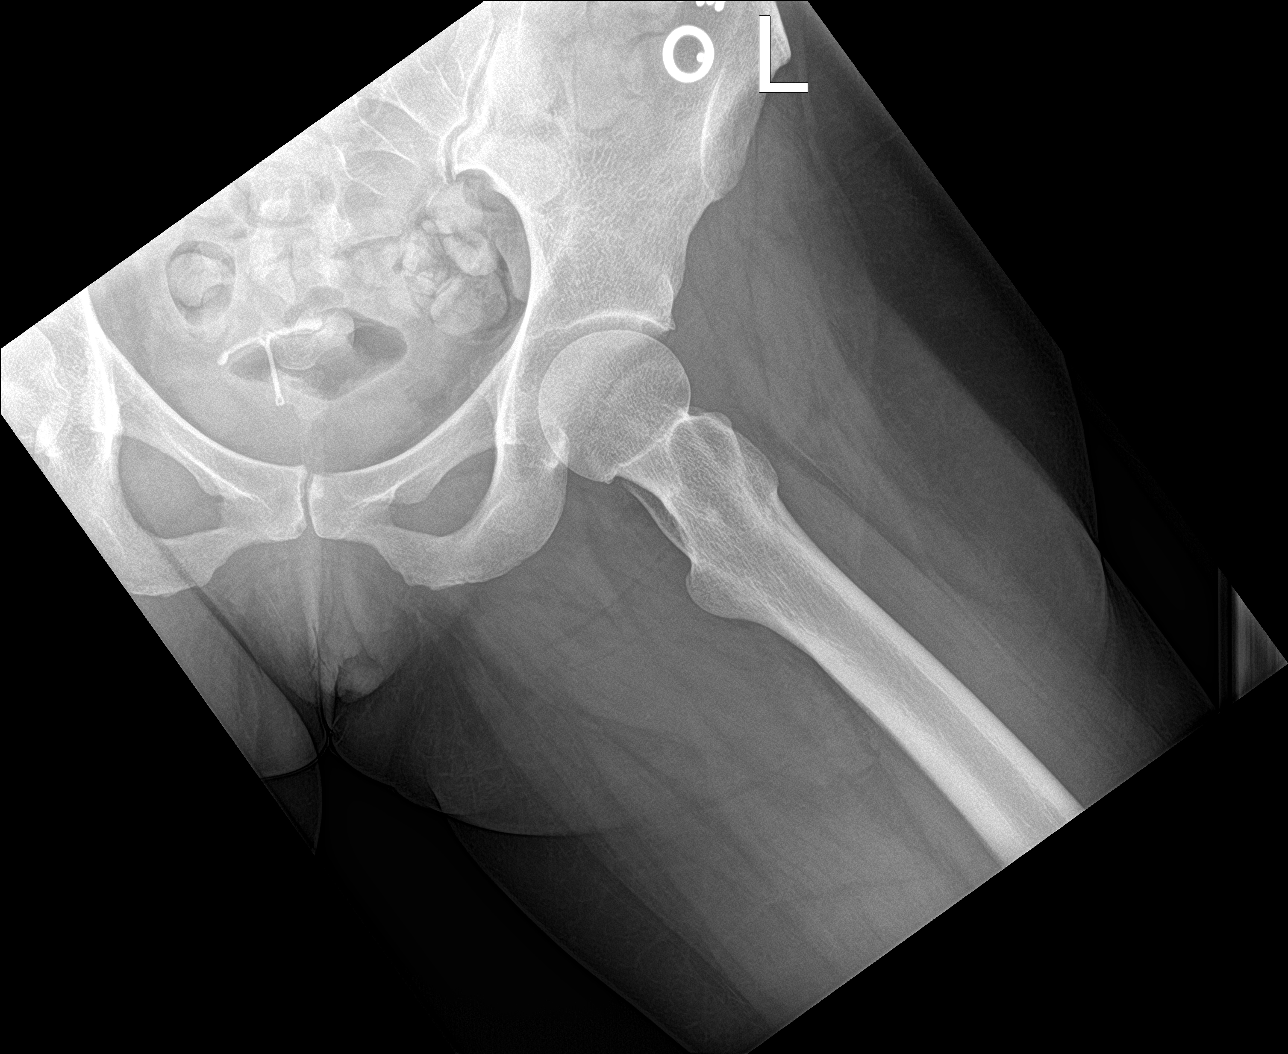

[3 of 3 positions shown; findings below may reference images not displayed]

FINDINGS: Left hip: Frontal view of the pelvis as well as frontal and frogleg
lateral views of the left hip are obtained. No acute displaced
fracture. Joint spaces are well preserved. IUD overlies the pelvis.

Left femur: Frontal and lateral views of the mid to distal left
femur are obtained. Proximal femur is included on the corresponding
hip x-ray. No fractures. Alignment is anatomic. Joint spaces of the
left knee are well preserved. Soft tissues are normal.
IMPRESSION: 1. Unremarkable left hip and left femur.

## 2020-01-29 MED ORDER — GABAPENTIN 100 MG PO CAPS: ORAL_CAPSULE | ORAL | 3 refills | Status: DC

## 2020-01-29 MED FILL — GABAPENTIN 100 MG CAPSULE: 100 | 30 days supply | Qty: 90 | Fill #0

## 2020-01-29 NOTE — Assessment & Plan Note (Addendum)
April Webster has a long history of low back pain, she is had some physical therapy, NSAIDs, steroids, muscle relaxers. MRI report is as above. Pain today is in the anterior thigh. I would like updated x-rays of her lumbar spine as well as her femur considering her anterior thigh pain. She will do some physical therapy, switching from baclofen to gabapentin 100 mg to be used through the day and at night as needed. If no better in 6 weeks we will probably order a left-sided lumbar epidural, she is going to get her lumbar spine MRI from [facility] on a CD for me to review.

## 2020-01-29 NOTE — Progress Notes (Signed)
    Procedures performed today:    None.  Independent interpretation of notes and tests performed by another provider:   None.  Brief History, Exam, Impression, and Recommendations:    Chronic left hip pain April Webster is a long history of pain in her left hip, localized in the groin, on exam she has a positive FADIR sign consistent with a hip labral injury. We will start conservative we will start conservatively, x-rays, she can continue her meloxicam, adding some PT. If not feeling sufficiently improved in 4 to 6 weeks we will consider MR arthrography.  Facet syndrome, lumbar April Webster has a long history of low back pain, she is had some physical therapy, NSAIDs, steroids, muscle relaxers. MRI report is as above. Pain today is in the anterior thigh. I would like updated x-rays of her lumbar spine as well as her femur considering her anterior thigh pain. She will do some physical therapy, switching from baclofen to gabapentin 100 mg to be used through the day and at night as needed. If no better in 6 weeks we will probably order a left-sided lumbar epidural, she is going to get her lumbar spine MRI from [facility] on a CD for me to review.    ___________________________________________ Gwen Her. Dianah Field, M.D., ABFM., CAQSM. Primary Care and [place] In[employer] of [employer] [facility] [employer] [facility]

## 2020-01-29 NOTE — Assessment & Plan Note (Signed)
April Webster is a long history of pain in her left hip, localized in the groin, on exam she has a positive FADIR sign consistent with a hip labral injury. We will start conservative we will start conservatively, x-rays, she can continue her meloxicam, adding some PT. If not feeling sufficiently improved in 4 to 6 weeks we will consider MR arthrography.

## 2020-01-31 ENCOUNTER — Encounter: Payer: Self-pay | Admitting: Rehabilitative and Restorative Service Providers"

## 2020-01-31 ENCOUNTER — Ambulatory Visit (INDEPENDENT_AMBULATORY_CARE_PROVIDER_SITE_OTHER): Payer: No Typology Code available for payment source | Admitting: Rehabilitative and Restorative Service Providers"

## 2020-01-31 ENCOUNTER — Other Ambulatory Visit: Payer: Self-pay

## 2020-01-31 DIAGNOSIS — R293 Abnormal posture: Secondary | ICD-10-CM

## 2020-01-31 DIAGNOSIS — M542 Cervicalgia: Secondary | ICD-10-CM

## 2020-01-31 DIAGNOSIS — R29898 Other symptoms and signs involving the musculoskeletal system: Secondary | ICD-10-CM

## 2020-01-31 DIAGNOSIS — M539 Dorsopathy, unspecified: Secondary | ICD-10-CM | POA: Diagnosis not present

## 2020-01-31 DIAGNOSIS — M25552 Pain in left hip: Secondary | ICD-10-CM

## 2020-01-31 NOTE — Therapy (Signed)
[place] [address] [place], [place], [address] Phone: [phone]   Fax:  [phone]  Physical Therapy Treatment  Patient Details  Name: April Webster MRN: [id number] Date of Birth: February 25, 1976 Referring Provider (PT): Dr Barnetta Chapel Metheney/Dr Dianah Field   Encounter Date: 01/31/2020   PT End of Session - 01/31/20 1426    Visit Number 7    Number of Visits 22    Date for PT Re-Evaluation 03/20/20    PT Start Time 1147    PT Stop Time 1240    PT Time Calculation (min) 53 min    Activity Tolerance Patient tolerated treatment well           History reviewed. No pertinent past medical history.  Past Surgical History:  Procedure Laterality Date  . ABDOMINOPLASTY    . PLACEMENT OF BREAST IMPLANTS      There were no vitals filed for this visit.   Subjective Assessment - 01/31/20 1156    Subjective Patient reports that she has been having deep Lt anterior thigh pain for the past week and a half as well as pain in the anterior hip or groin which started 3 days ago. Thigh pain is intermittent  and worse at night and not re-produced with palpation. Anterior hip pain is constant and increased with movement and test positions. History of back pain x 20 yrs with intermittent intense pain but a constant low level pain.    Currently in Pain? Yes    Pain Score 5     Pain Location Hip    Pain Orientation Left;Anterior    Pain Descriptors / Indicators Burning;Sharp    Pain Type Acute pain    Pain Onset In the past 7 days    Pain Frequency Constant    Aggravating Factors  twisting or turning hip in standing;    Pain Relieving Factors wearing heels;              [facility] PT Assessment - 01/31/20 0001      Assessment   Medical Diagnosis Cervical dysfunction; Lt Hip pain and anterior thigh pain     Referring Provider (PT) Dr Barnetta Chapel Metheney/Dr Dianah Field    Onset Date/Surgical Date 01/21/20    Hand Dominance  Right    Next MD Visit PRN    Prior Therapy yes for LBP       Balance Screen   Has the patient fallen in the past 6 months No    Has the patient had a decrease in activity level because of a fear of falling?  No    Is the patient reluctant to leave their home because of a fear of falling?  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     Leisure 4 large dogs; household chores; sewing; woodwork - tools and furniture; reading; music       Sensation   Additional Comments some discoloration and dec temp in bilat hands when hands are at sides for ~ 5 min intermittent       Posture/Postural Control   Posture Comments flexed forward; wt shifted to Rt; head forward; shoulders and thoracic spine rounded; head of the humerus anterior in orientation       AROM   AROM Assessment Site --   tightness/discomfort w/ lumbar motions no radicular symptoms   Lumbar Flexion 85%  Lumbar Extension 55%    Lumbar - Right Side Bend 90%    Lumbar - Left Side Bend 90%    Lumbar - Right Rotation 40%    Lumbar - Left Rotation 40%      Strength   Overall Strength Comments WFLs bilat LE's       Palpation   Spinal mobility hypomobile with PA mobs lumbar spine     SI assessment  PSIS; iliac crest slightly higher Lt with increased skin fold Lt trunk    Palpation comment muscular tightness through the Lt piriformis; glut med; obtruator internus; psoas; hip flexors       Special Tests   Other special tests (-) SLR; tight with FABERS popping, no radicular Lt LE pain       Ambulation/Gait   Gait Comments antalgic gait with limp Lt LE in wt bearing Lt                         [facility] Adult PT Treatment/Exercise - 01/31/20 0001      Therapeutic Activites    Other Therapeutic Activities myofacial ball release abdominal and hips flexors with coregeous ball prolonged release pt prone       Knee/Hip  Exercises: Stretches   Passive Hamstring Stretch Left;Right;2 reps;30 seconds   supine with strap    Quad Stretch Left;Right;2 reps;30 seconds   prone with strap    Hip Flexor Stretch Left;2 reps;30 seconds   supine thomas; seated x 2 each    ITB Stretch Left;2 reps;30 seconds   supine with strap    Piriformis Stretch Left;2 reps;30 seconds   supine piriformis                  PT Education - 01/31/20 1234    Education Details HEP    Person(s) Educated Patient    Methods Explanation;Demonstration;Tactile cues;Verbal cues;Handout    Comprehension Verbalized understanding;Returned demonstration;Verbal cues required;Tactile cues required               PT Long Term Goals - 01/31/20 1423      PT LONG TERM GOAL #1   Title Improve posture and alignment with patient to demonstrate improved upright posture with posterior shoulder girdle engaged    Time 6    Period Weeks    Status On-going    Target Date 03/20/20      PT LONG TERM GOAL #2   Title Decrease cervical pain with functional activities with patient to report no more than 2-3/10 with looking up    Time 6    Period Weeks    Status Partially Met    Target Date 03/20/20      PT LONG TERM GOAL #3   Title Increase cervical ROM in extension by 15-20 degrees and Lt lateral flexion/Lt rotation by 3-5 degrees with all cervical motions pain free    Time 6    Period Weeks    Status Partially Met    Target Date 03/20/20      PT LONG TERM GOAL #4   Title Independent in HEP    Time 6    Period Weeks    Status On-going    Target Date 03/20/20      PT LONG TERM GOAL #5   Title Improve FOTO to </= 34% limitation    Time 6    Period Weeks    Status On-going    Target Date 03/20/20  PT LONG TERM GOAL #6   Title Decrease Lt LE pain by 50-75% allowing patient to ambulate without limp and return to more normal functional activities    Time 6    Period Weeks    Status New    Target Date 03/20/20      PT LONG TERM  GOAL #7   Title Improve core strength and stability with patient independent in HEP for core strength    Time 6    Period Weeks    Status New    Target Date 03/20/20                 Plan - 01/31/20 1219    Clinical Impression Statement Patient presents with 1.5 week history of Lt anterior thigh pain and ~4 day history of Lt anterior hip/groin pain. She has a 20 year history of LBP. Patient has poor posture and alignment; limited trun and LE mobilty/ROM; muscular tightness to palpation; extensive scar tissue through abdomen; pain with functional activities; limitations due to pain. Patient returns with improved cervical mobility/ROM and decreased pain with cervical extension. She will benefit form PT to address Lt LE pain and continue to work on cervical dysfunction.    Stability/Clinical Decision Making Stable/Uncomplicated    Clinical Decision Making Low    Rehab Potential Good    PT Frequency 2x / week    PT Duration 6 weeks    PT Treatment/Interventions ADLs/Self Care Home Management;Aquatic Therapy;Cryotherapy;Electrical Stimulation;Iontophoresis 41m/ml Dexamethasone;Moist Heat;Ultrasound;Functional mobility training;Therapeutic activities;Therapeutic exercise;Balance training;Neuromuscular re-education;Patient/family education;Manual techniques;Dry needling;Taping    PT Home Exercise Plan [email or web address]    Consulted and Agree with Plan of Care Patient           Patient will benefit from skilled therapeutic intervention in order to improve the following deficits and impairments:  Decreased range of motion, Increased fascial restricitons, Decreased activity tolerance, Pain, Hypomobility, Impaired flexibility, Improper body mechanics, Decreased mobility, Impaired sensation, Postural dysfunction  Visit Diagnosis: Cervicalgia - Plan: PT plan of care cert/re-cert  Cervical dysfunction - Plan: PT plan of care cert/re-cert  Abnormal posture - Plan: PT plan of care cert/re-cert  Other  symptoms and signs involving the musculoskeletal system - Plan: PT plan of care cert/re-cert  Hip pain, left - Plan: PT plan of care cert/re-cert     Problem List Patient Active Problem List   Diagnosis Date Noted  . Chronic left hip pain 01/29/2020  . Cervical pain 12/14/2019  . Facial trauma 12/03/2019  . Poor sleep pattern 11/16/2019  . Chronic insomnia 09/20/2019  . Gastroesophageal reflux disease 05/16/2019  . Hypothyroidism 03/19/2019  . Hyperlipidemia 03/19/2019  . Essential hypertension 03/19/2019  . Facet syndrome, lumbar 03/19/2019  . Menorrhagia 03/19/2019  . 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 Lichen sclerosus 041/93/7902 . Upper airway cough syndrome 03/19/2019  . IUD (intrauterine device) in place 12/06/2016  . Lesion of oral mucosa 08/01/2014  . Depression 05/10/2011    Herma Uballe PNilda SimmerPT, MPH  01/31/2020, 2:29 PM  [facility] [place] [address]Phone: [phone]  Fax:  [phone] Name: April CRENSHAWMRN: [id number] of Birth: [birth year]

## 2020-01-31 NOTE — Patient Instructions (Addendum)
   Access Code: [email or web address]: [email or web address]: 11/18/2021Prepared by: Karey Suthers HoltExercises  Doorway Pec Stretch at 60 Degrees Abduction - 3 x daily - 7 x weekly - 3 reps - 1 sets  Doorway Pec Stretch at 90 Degrees Abduction - 3 x daily - 7 x weekly - 3 reps - 1 sets - 30 seconds hold  Doorway Pec Stretch at 120 Degrees Abduction - 3 x daily - 7 x weekly - 3 reps - 1 sets - 30 second hold hold  Seated Cervical Retraction - 2 x daily - 7 x weekly - 1-2 sets - 5-10 reps - 10 sec hold  Standing Anatomical Position with Scapular Retraction and Depression at Wall - 2 x daily - 7 x weekly - 1 sets - 10 reps  Shoulder External Rotation and Scapular Retraction - 2 x daily - 7 x weekly - 1 sets - 10 reps - 5 sec hold  Shoulder External Rotation in 45 Degrees Abduction - 2 x daily - 7 x weekly - 1-2 sets - 10 reps - 3 sec hold  Standing Isometric Cervical Sidebending with Manual Resistance - 2 x daily - 7 x weekly - 1 sets - 5 reps - 5 seconds hold  Thoracic Extension Mobilization with Noodle - 2 x daily - 7 x weekly - 1 sets - 3 reps - 30 sec hold  Prone Upper Back Extension Off Table with Hands Behind Head - 2 x daily - 7 x weekly - 1 sets - 5-10 reps - 5-10 sec hold  Supine Scapular Retraction - 2 x daily - 7 x weekly - 1 sets - 10 reps - 5-10 sec hold  Prone Shoulder Extension - 2 x daily - 7 x weekly - 1 sets - 3 reps - 30 sec hold  Prone Chest Lift with Arms Overhead - 2 x daily - 7 x weekly - 1 sets - 3 reps - 30 sec hold  Supine Piriformis Stretch with Leg Straight - 2 x daily - 7 x weekly - 1 sets - 3 reps - 30 sec hold  Standing Shoulder External Rotation with Resistance - 2 x daily - 7 x weekly - 1-3 sets - 10 reps - 2-3 sec hold  Standing Bilateral Low Shoulder Row with Anchored Resistance - 2 x daily - 7 x weekly - 1-3 sets - 10 reps - 2-3 sec hold  Shoulder Extension with Resistance - 2 x daily - 7 x weekly - 1-3 sets - 10 reps - 2-3 sec hold  Prone Quadriceps Stretch with  Strap - 2 x daily - 7 x weekly - 1 sets - 3 reps - 30 sec hold  Hooklying Hamstring Stretch with Strap - 2 x daily - 7 x weekly - 1 sets - 3 reps - 30 sec hold  Supine ITB Stretch with Strap - 2 x daily - 7 x weekly - 1 sets - 3 reps - 30 sec hold  Supine Piriformis Stretch with Leg Straight - 2 x daily - 7 x weekly - 1 sets - 3 reps - 30 sec hold  Hip Flexor Stretch at Edge of Bed - 2 x daily - 7 x weekly - 1 sets - 3 reps - 30 sec hold

## 2020-02-05 MED FILL — MELOXICAM 15 MG TABLET: 15 | 90 days supply | Qty: 90 | Fill #1

## 2020-02-05 MED FILL — SYNTHROID 112 MCG TABLET: 112 | 90 days supply | Qty: 90 | Fill #1

## 2020-02-05 MED FILL — MONTELUKAST SOD 10 MG TAB: 10 | 90 days supply | Qty: 90 | Fill #3

## 2020-02-05 MED FILL — FINASTERIDE 5 MG TABLET: 5 | 90 days supply | Qty: 45 | Fill #3

## 2020-02-05 MED FILL — ATORVASTATIN CALCIUM 20 MG: 20 | 90 days supply | Qty: 90 | Fill #3

## 2020-02-05 MED FILL — HYDROCHLOROTHIAZIDE 25 MG T: 25 | 90 days supply | Qty: 90 | Fill #1

## 2020-02-18 ENCOUNTER — Encounter: Payer: Self-pay | Admitting: Family Medicine

## 2020-02-18 ENCOUNTER — Ambulatory Visit (INDEPENDENT_AMBULATORY_CARE_PROVIDER_SITE_OTHER): Payer: No Typology Code available for payment source | Admitting: Family Medicine

## 2020-02-18 ENCOUNTER — Other Ambulatory Visit: Payer: Self-pay

## 2020-02-18 VITALS — BP 105/72 | HR 92 | Temp 98.5°F | Resp 16 | Ht 63.0 in | Wt 131.3 lb

## 2020-02-18 DIAGNOSIS — I10 Essential (primary) hypertension: Secondary | ICD-10-CM

## 2020-02-18 DIAGNOSIS — E039 Hypothyroidism, unspecified: Secondary | ICD-10-CM | POA: Diagnosis not present

## 2020-02-18 DIAGNOSIS — I7300 Raynaud's syndrome without gangrene: Secondary | ICD-10-CM | POA: Insufficient documentation

## 2020-02-18 DIAGNOSIS — K5909 Other constipation: Secondary | ICD-10-CM | POA: Insufficient documentation

## 2020-02-18 DIAGNOSIS — Z1231 Encounter for screening mammogram for malignant neoplasm of breast: Secondary | ICD-10-CM

## 2020-02-18 DIAGNOSIS — Z1159 Encounter for screening for other viral diseases: Secondary | ICD-10-CM

## 2020-02-18 DIAGNOSIS — N3010 Interstitial cystitis (chronic) without hematuria: Secondary | ICD-10-CM

## 2020-02-18 MED ORDER — LUBIPROSTONE 24 MCG PO CAPS: 24.0000 ug | ORAL_CAPSULE | Freq: Two times a day (BID) | ORAL | 3 refills | Status: DC

## 2020-02-18 MED FILL — PANTOPRAZOLE SOD DR 20 MG T: 20 | 90 days supply | Qty: 90 | Fill #1

## 2020-02-18 NOTE — Assessment & Plan Note (Signed)
Plan to recheck in Jan

## 2020-02-18 NOTE — Assessment & Plan Note (Signed)
BP low today. OK to hol BP med and see how she feels and check BP for 2 weeks off the medication.  Looks awesome!!!!!

## 2020-02-18 NOTE — Assessment & Plan Note (Signed)
New Dx.  Will do additional labs to rule out other potential causes. Hx of autoimmune d/o.  Symptomatic care.

## 2020-02-18 NOTE — Progress Notes (Signed)
Established Patient Office Visit  Subjective:  Patient ID: April Webster, female    DOB: 03-Dec-1975  Age: 44 y.o. MRN: [id number]  CC:  Chief Complaint  Patient presents with  . abnormal weight gain    HPI [place] presents for weight loss. She is doing well. She is now on phentermine every other day and doing well. She has noticed some increase in hunger signals.    Would like to try something for constipation. She is currently relying on dulcolax.  Miralax doesn't work well.  Still has intermittent episodes of epigastric pain even though she is had a cholecystectomy where she felt like if she was just able to belch she would actually get some relief it feels very much like a pressure sensation.  It can happen every other day for days to even a couple of weeks in a row and then may go away for months at a time.  She does notice it is a little bit worse with more acidic foods.  She does take a PPI daily sometimes if she doubles up on her PPI can help slightly.  Pepto-Bismol can provide some temporary relief as well.  She is also been having episodes of feeling like her hands get cold and then noticing her fingertips actually look white.  She was actually able to capture a photograph 1 evening while driving home.  She says they can was feel little numb when it happens.  She is never noticed it on her feet or other appendages.  Hypertension- Pt denies chest pain, SOB, dizziness, or heart palpitations.  Taking meds as directed w/o problems.  Denies medication side effects.      No past medical history on file.  Past Surgical History:  Procedure Laterality Date  . ABDOMINOPLASTY    . PLACEMENT OF BREAST IMPLANTS      No family history on file.  Social History   Socioeconomic History  . Marital status: Married    Spouse name: Not on file  . Number of children: Not on file  . Years of education: Not on file  . Highest education level: Not on file  Occupational History  . Not  on file  Tobacco Use  . Smoking status: Former Smoker    Packs/day: 2.00    Years: 5.00    Pack years: 10.00    Types: Cigarettes    Quit date: 03/15/1996    Years since quitting: 23.9  . Smokeless tobacco: Never Used  Substance and Sexual Activity  . Alcohol use: Yes    Alcohol/week: 2.0 standard drinks    Types: 2 Glasses of wine per week  . Drug use: Never  . Sexual activity: Yes    Partners: Male  Other Topics Concern  . Not on file  Social History Narrative  . Not on file   Social Determinants of Health   Financial Resource Strain:   . Difficulty of Paying Living Expenses: Not on file  Food Insecurity:   . Worried About Charity fundraiser in the Last Year: Not on file  . Ran Out of Food in the Last Year: Not on file  Transportation Needs:   . Lack of Transportation (Medical): Not on file  . Lack of Transportation (Non-Medical): Not on file  Physical Activity:   . Days of Exercise per Week: Not on file  . Minutes of Exercise per Session: Not on file  Stress:   . Feeling of Stress : Not on file  Social Connections:   . Frequency of Communication with Friends and Family: Not on file  . Frequency of Social Gatherings with Friends and Family: Not on file  . Attends Religious Services: Not on file  . Active Member of Clubs or Organizations: Not on file  . Attends Archivist Meetings: Not on file  . Marital Status: Not on file  Intimate Partner Violence:   . Fear of Current or Ex-Partner: Not on file  . Emotionally Abused: Not on file  . Physically Abused: Not on file  . Sexually Abused: Not on file    Outpatient Medications Prior to Visit  Medication Sig Dispense Refill  . hydrochlorothiazide (HYDRODIURIL) 25 MG tablet Take 25 mg by mouth daily.    Marland Kitchen atorvastatin (LIPITOR) 20 MG tablet Take 1 tablet (20 mg total) by mouth at bedtime. 90 tablet 3  . clobetasol ointment (TEMOVATE) 1.44 % Apply 1 application topically daily as needed for rash. LIchen  sclerosis    . diphenhydrAMINE (BENADRYL) 25 MG tablet Take 25 mg by mouth at bedtime as needed for sleep.    . finasteride (PROSCAR) 5 MG tablet Take 0.5 tablets (2.5 mg total) by mouth daily. 90 tablet 3  . gabapentin (NEURONTIN) 100 MG capsule 1 capsule p.o. nightly to 3 times daily 90 capsule 3  . levonorgestrel (MIRENA) 20 MCG/24HR IUD by Intrauterine route.    . meloxicam (MOBIC) 15 MG tablet TAKE 1 TABLET (15 MG TOTAL) BY MOUTH DAILY AS NEEDED. 90 tablet 1  . montelukast (SINGULAIR) 10 MG tablet Take 1 tablet (10 mg total) by mouth daily as needed. 90 tablet 3  . pantoprazole (PROTONIX) 20 MG tablet Take 1 tablet (20 mg total) by mouth daily. 90 tablet 3  . phentermine 15 MG capsule Take 1 capsule (15 mg total) by mouth every morning. 30 capsule 1  . SYNTHROID 112 MCG tablet TAKE 1 TABLET (112 MCG TOTAL) BY MOUTH DAILY. 90 tablet 1   No facility-administered medications prior to visit.    Allergies  Allergen Reactions  . Cefuroxime Axetil Rash  . Cephalexin Rash  . Tuberculin Purified Protein Derivative Rash    Erythema and itching without induration  . Ambien [Zolpidem] Other (See Comments)    Sleep talking    ROS Review of Systems    Objective:    Physical Exam Constitutional:      Appearance: She is well-developed.  HENT:     Head: Normocephalic and atraumatic.  Cardiovascular:     Rate and Rhythm: Normal rate and regular rhythm.     Heart sounds: Normal heart sounds.  Pulmonary:     Effort: Pulmonary effort is normal.     Breath sounds: Normal breath sounds.  Skin:    General: Skin is warm and dry.  Neurological:     Mental Status: She is alert and oriented to person, place, and time.  Psychiatric:        Behavior: Behavior normal.     BP 105/72   Pulse 92   Temp 98.5 F (36.9 C)   Resp 16   Ht 5' 3"  (1.6 m)   Wt 131 lb 4.8 oz (59.6 kg)   SpO2 100%   BMI 23.26 kg/m  Wt Readings from Last 3 Encounters:  02/18/20 131 lb 4.8 oz (59.6 kg)  12/13/19  134 lb 14.4 oz (61.2 kg)  11/16/19 138 lb 8 oz (62.8 kg)     Health Maintenance Due  Topic Date Due  . Hepatitis C  Screening  Never done  . HIV Screening  Never done  . PAP SMEAR-Modifier  12/07/2019    There are no preventive care reminders to display for this patient.  Lab Results  Component Value Date   TSH 2.30 08/20/2019   Lab Results  Component Value Date   WBC 6.1 08/20/2019   HGB 15.3 08/20/2019   HCT 44.8 08/20/2019   MCV 94.7 08/20/2019   PLT 273 08/20/2019   Lab Results  Component Value Date   NA 140 03/27/2019   K 3.9 03/27/2019   CO2 28 03/27/2019   GLUCOSE 80 03/27/2019   BUN 14 03/27/2019   CREATININE 0.77 03/27/2019   BILITOT 0.5 03/27/2019   AST 21 03/27/2019   ALT 18 03/27/2019   PROT 6.6 03/27/2019   CALCIUM 9.6 03/27/2019   Lab Results  Component Value Date   CHOL 151 03/27/2019   Lab Results  Component Value Date   HDL 57 03/27/2019   Lab Results  Component Value Date   LDLCALC 80 03/27/2019   Lab Results  Component Value Date   TRIG 60 03/27/2019   Lab Results  Component Value Date   CHOLHDL 2.6 03/27/2019   No results found for: HGBA1C    Assessment & Plan:   Problem List Items Addressed This Visit      Cardiovascular and Mediastinum   Raynaud's disease    New Dx.  Will do additional labs to rule out other potential causes. Hx of autoimmune d/o.  Symptomatic care.       Relevant Orders   Sedimentation rate   C-reactive protein   COMPLETE METABOLIC PANEL WITH GFR   Lipid panel   CBC   ANA   Essential hypertension - Primary    BP low today. OK to hol BP med and see how she feels and check BP for 2 weeks off the medication.  Looks awesome!!!!!      Relevant Orders   Sedimentation rate   C-reactive protein   COMPLETE METABOLIC PANEL WITH GFR   Lipid panel   CBC     Digestive   Chronic constipation    Failed MiraLAX.  Typically relies on Dulcolax for symptom relief.  Recommend a trial of Amitiza.  24 mcg  capsule twice daily.  Prescription sent to pharmacy.      Relevant Medications   lubiprostone (AMITIZA) 24 MCG capsule     Endocrine   Hypothyroidism    Plan to recheck in Jan      Relevant Orders   TSH + free T4     Genitourinary   IC (interstitial cystitis)    Other Visit Diagnoses    Encounter for hepatitis C screening test for low risk patient       Relevant Orders   Hepatitis C Antibody      Meds ordered this encounter  Medications  . lubiprostone (AMITIZA) 24 MCG capsule    Sig: Take 1 capsule (24 mcg total) by mouth 2 (two) times daily with a meal.    Dispense:  60 capsule    Refill:  3    Follow-up: No follow-ups on file.    Beatrice Lecher, MD

## 2020-02-18 NOTE — Assessment & Plan Note (Addendum)
Failed MiraLAX.  Typically relies on Dulcolax for symptom relief.  Recommend a trial of Amitiza.  24 mcg capsule twice daily.  Prescription sent to pharmacy.

## 2020-02-19 ENCOUNTER — Encounter: Payer: Self-pay | Admitting: Family Medicine

## 2020-02-19 NOTE — Addendum Note (Signed)
Addended by: Beatrice Lecher D on: 02/19/2020 01:00 PM   Modules accepted: Orders

## 2020-02-21 ENCOUNTER — Ambulatory Visit (INDEPENDENT_AMBULATORY_CARE_PROVIDER_SITE_OTHER): Payer: No Typology Code available for payment source | Admitting: Rehabilitative and Restorative Service Providers"

## 2020-02-21 ENCOUNTER — Other Ambulatory Visit: Payer: Self-pay

## 2020-02-21 ENCOUNTER — Telehealth: Payer: Self-pay | Admitting: Family Medicine

## 2020-02-21 ENCOUNTER — Encounter: Payer: Self-pay | Admitting: Rehabilitative and Restorative Service Providers"

## 2020-02-21 ENCOUNTER — Other Ambulatory Visit: Payer: Self-pay | Admitting: Family Medicine

## 2020-02-21 DIAGNOSIS — M542 Cervicalgia: Secondary | ICD-10-CM

## 2020-02-21 DIAGNOSIS — R293 Abnormal posture: Secondary | ICD-10-CM | POA: Diagnosis not present

## 2020-02-21 DIAGNOSIS — M539 Dorsopathy, unspecified: Secondary | ICD-10-CM | POA: Diagnosis not present

## 2020-02-21 MED ORDER — LINACLOTIDE 145 MCG PO CAPS: 145.0000 ug | ORAL_CAPSULE | Freq: Every day | ORAL | 0 refills | Status: DC

## 2020-02-21 MED FILL — LINZESS 145 MCG CAPSULE: 145 | 90 days supply | Qty: 90 | Fill #0

## 2020-02-21 NOTE — Patient Instructions (Addendum)
Access Code: [email or web address] URL: [email or web address] Date: 02/21/2020 Prepared by: Rudell Cobb  Exercises Doorway Pec Stretch at 90 Degrees Abduction - 3 x daily - 7 x weekly - 3 reps - 1 sets - 30 seconds hold Shoulder External Rotation in 45 Degrees Abduction - 2 x daily - 7 x weekly - 1-2 sets - 10 reps - 3 sec hold Standing Anatomical Position with Scapular Retraction and Depression at [place] - 2 x daily - 7 x weekly - 1 sets - 10 reps Standing with Forearms Thoracic Rotation - 2 x daily - 7 x weekly - 1 sets - 10 reps Standing Isometric Cervical Sidebending with Manual Resistance - 2 x daily - 7 x weekly - 1 sets - 5 reps - 5 seconds hold Seated Cervical Retraction and Rotation - 2 x daily - 7 x weekly - 1 sets - 10 reps Thoracic Extension Mobilization with Noodle - 2 x daily - 7 x weekly - 1 sets - 3 reps - 30 sec hold Supine Passive Cervical Extension - 2 x daily - 7 x weekly - 1 sets - 5 reps - 15 seconds hold

## 2020-02-21 NOTE — Therapy (Signed)
[place], [place], [address] Phone: [phone]   Fax:  [phone]  Physical Therapy Treatment  Patient Details  Name: April Webster MRN: [id number] Date of Birth: [birth year] Referring Provider (PT): Dr Barnetta Chapel Metheney/Dr Dianah Field   Encounter Date: 02/21/2020   PT End of Session - 02/21/20 1458    Visit Number 8    Number of Visits 22    Date for PT Re-Evaluation 03/20/20    PT Start Time 3244    PT Stop Time 1532    PT Time Calculation (min) 41 min    Activity Tolerance Patient tolerated treatment well           History reviewed. No pertinent past medical history.  Past Surgical History:  Procedure Laterality Date  . ABDOMINOPLASTY    . PLACEMENT OF BREAST IMPLANTS      There were no vitals filed for this visit.   Subjective Assessment - 02/21/20 1453    Subjective The patient reports the left leg has improved.  She is no longer having the L anterior hip/groin pain.    Pertinent History 3 C-sections; repair of rectus diastasis; falls with fx coccyx; gall bladder surgery; breast augmentation; LBP x >15 yrs; Rt lower quadrant pain with palpation    Patient Stated Goals get neck moving and feeling better    Currently in Pain? Yes    Pain Score 0-No pain    Pain Location Hip    Multiple Pain Sites Yes   a problem for years   Pain Score 0   gets a pinch when looking up   Pain Location Neck    Pain Orientation Right    Pain Onset More than a month ago    Pain Frequency Intermittent    Aggravating Factors  looking up    Pain Relieving Factors neutral head              [facility] PT Assessment - 02/21/20 1520      Assessment   Medical Diagnosis Cervical dysfunction; Lt Hip pain and anterior thigh pain     Referring Provider (PT) Dr Barnetta Chapel Metheney/Dr Dianah Field    Onset Date/Surgical Date 01/21/20    Hand Dominance Right    Next MD Visit PRN    Prior Therapy yes for LBP                           [facility] Adult PT Treatment/Exercise - 02/21/20 1521      Exercises   Exercises Neck;Shoulder      Neck Exercises: Standing   Neck Retraction 5 reps    Neck Retraction Limitations adding retraction + cervical rotation x 5 reps R and L    Other Standing Exercises wall lean on elbows with trunk opening R and L for thoracic rotation and extension      Neck Exercises: Seated   Other Seated Exercise seated thoracic rotation with arms in flexion      Neck Exercises: Supine   Neck Retraction 5 reps;5 secs    Other Supine Exercise passive and active cervical extension x 5 reps off edge of mat      Neck Exercises: Prone   Other Prone Exercise prone on elbows with trunk rotation R and L x 5 reps (arm behind head)    Other Prone Exercise Prone on elbows for thoracic extension      Manual Therapy   Manual Therapy Joint  mobilization;Soft tissue mobilization;Manual Traction;Passive ROM    Manual therapy comments to reduce trigger points and pain    Joint Mobilization grade I-II AP mobs C spine; prone CPA t-spine and lateral glides grade II    Soft tissue mobilization supine lateral neck musculature, upper trap, suboccipitals    Passive ROM passive overpressure into cervical sidebending    Manual Traction gentle manual traction x 20 second holds                  PT Education - 02/21/20 1531    Education Details HEP    Person(s) Educated Patient    Methods Explanation;Demonstration;Handout    Comprehension Verbalized understanding;Returned demonstration               PT Long Term Goals - 02/21/20 1633      PT LONG TERM GOAL #1   Title Improve posture and alignment with patient to demonstrate improved upright posture with posterior shoulder girdle engaged    Time 6    Period Weeks    Status On-going      PT LONG TERM GOAL #2   Title Decrease cervical pain with functional activities with patient to report no more than 2-3/10 with  looking up    Time 6    Period Weeks    Status Partially Met      PT LONG TERM GOAL #3   Title Increase cervical ROM in extension by 15-20 degrees and Lt lateral flexion/Lt rotation by 3-5 degrees with all cervical motions pain free    Time 6    Period Weeks    Status Partially Met      PT LONG TERM GOAL #4   Title Independent in HEP    Time 6    Period Weeks    Status On-going      PT LONG TERM GOAL #5   Title Improve FOTO to </= 34% limitation    Time 6    Period Weeks    Status On-going      PT LONG TERM GOAL #6   Title Decrease Lt LE pain by 50-75% allowing patient to ambulate without limp and return to more normal functional activities    Baseline patient's hip pain resolved    Time 6    Period Weeks    Status Achieved      PT LONG TERM GOAL #7   Title Improve core strength and stability with patient independent in HEP for core strength    Baseline patient hip pain resolved    Time 6    Period Weeks    Status Achieved                 Plan - 02/21/20 1637    Clinical Impression Statement The patient is no longer experiencing hip pain.  Her neck pain is chronic in nature.  We discussed continuing ther ex for cervical spine.  *PT added one further exercise after session to HEP (sent note via EPIC).  We discussed during therapy and perfomred in clinic.  Continue to LTGs.    Stability/Clinical Decision Making Stable/Uncomplicated    Rehab Potential Good    PT Frequency 2x / week    PT Duration 6 weeks    PT Treatment/Interventions ADLs/Self Care Home Management;Aquatic Therapy;Cryotherapy;Electrical Stimulation;Iontophoresis 58m/ml Dexamethasone;Moist Heat;Ultrasound;Functional mobility training;Therapeutic activities;Therapeutic exercise;Balance training;Neuromuscular re-education;Patient/family education;Manual techniques;Dry needling;Taping    PT Next Visit Plan PT consolidated HEP, spinal mobs, strengthening c-spine    PT Home Exercise Plan [email or web address]  Consulted and Agree with Plan of Care Patient           Patient will benefit from skilled therapeutic intervention in order to improve the following deficits and impairments:  Decreased range of motion,Increased fascial restricitons,Decreased activity tolerance,Pain,Hypomobility,Impaired flexibility,Improper body mechanics,Decreased mobility,Impaired sensation,Postural dysfunction  Visit Diagnosis: Cervicalgia  Cervical dysfunction  Abnormal posture     Problem List Patient Active Problem List   Diagnosis Date Noted  . Raynaud's disease 02/18/2020  . IC (interstitial cystitis) 02/18/2020  . Chronic constipation 02/18/2020  . Chronic left hip pain 01/29/2020  . Cervical pain 12/14/2019  . Facial trauma 12/03/2019  . Poor sleep pattern 11/16/2019  . Chronic insomnia 09/20/2019  . Gastroesophageal reflux disease 05/16/2019  . Hypothyroidism 03/19/2019  . Hyperlipidemia 03/19/2019  . Essential hypertension 03/19/2019  . Facet syndrome, lumbar 03/19/2019  . Menorrhagia 03/19/2019  . 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 Lichen sclerosus 14/60/4799  . Upper airway cough syndrome 03/19/2019  . IUD (intrauterine device) in place 12/06/2016  . Lesion of oral mucosa 08/01/2014  . Depression 05/10/2011    [place], PT 02/21/2020, 9:41 PM  [facility] [place], [place], [address] Phone: [phone]   Fax:  [phone]  Name: DAMALI BROADFOOT MRN: [id number] Date of Birth: 02/25/1976

## 2020-02-21 NOTE — Telephone Encounter (Signed)
Insurance would not cover Amitiza.  We will send new prescription for Linzess today.

## 2020-02-28 ENCOUNTER — Encounter: Payer: Self-pay | Admitting: Rehabilitative and Restorative Service Providers"

## 2020-02-28 ENCOUNTER — Ambulatory Visit (INDEPENDENT_AMBULATORY_CARE_PROVIDER_SITE_OTHER): Payer: No Typology Code available for payment source | Admitting: Rehabilitative and Restorative Service Providers"

## 2020-02-28 ENCOUNTER — Other Ambulatory Visit: Payer: Self-pay

## 2020-02-28 DIAGNOSIS — M542 Cervicalgia: Secondary | ICD-10-CM

## 2020-02-28 DIAGNOSIS — R293 Abnormal posture: Secondary | ICD-10-CM | POA: Diagnosis not present

## 2020-02-28 DIAGNOSIS — M539 Dorsopathy, unspecified: Secondary | ICD-10-CM

## 2020-02-28 DIAGNOSIS — R29898 Other symptoms and signs involving the musculoskeletal system: Secondary | ICD-10-CM | POA: Diagnosis not present

## 2020-02-28 NOTE — Patient Instructions (Signed)
Access Code: [email or web address] URL: [email or web address] Date: 02/28/2020 Prepared by: Rudell Cobb  Exercises Doorway Pec Stretch at 90 Degrees Abduction - 3 x daily - 7 x weekly - 3 reps - 1 sets - 30 seconds hold Shoulder External Rotation in 45 Degrees Abduction - 2 x daily - 7 x weekly - 1-2 sets - 10 reps - 3 sec hold Standing Anatomical Position with Scapular Retraction and Depression at [place] - 2 x daily - 7 x weekly - 1 sets - 10 reps Standing with Forearms Thoracic Rotation - 2 x daily - 7 x weekly - 1 sets - 10 reps Standing Isometric Cervical Sidebending with Manual Resistance - 2 x daily - 7 x weekly - 1 sets - 5 reps - 5 seconds hold Seated Cervical Retraction and Rotation - 2 x daily - 7 x weekly - 1 sets - 10 reps Thoracic Extension Mobilization with Noodle - 2 x daily - 7 x weekly - 1 sets - 3 reps - 30 sec hold Supine Passive Cervical Extension - 2 x daily - 7 x weekly - 1 sets - 5 reps - 15 seconds hold

## 2020-02-28 NOTE — Therapy (Signed)
[place], [place], [address] Phone: [phone]   Fax:  [phone]  Physical Therapy Treatment  Patient Details  Name: April Webster MRN: [id number] Date of Birth: 10/26/1975 Referring Provider (PT): Dr Barnetta Chapel Metheney/Dr Dianah Field   Encounter Date: 02/28/2020   PT End of Session - 02/28/20 1551    Visit Number 9    Number of Visits 22    Date for PT Re-Evaluation 03/20/20    PT Start Time 0177    PT Stop Time 1446    PT Time Calculation (min) 43 min    Activity Tolerance Patient tolerated treatment well           History reviewed. No pertinent past medical history.  Past Surgical History:  Procedure Laterality Date  . ABDOMINOPLASTY    . PLACEMENT OF BREAST IMPLANTS      There were no vitals filed for this visit.   Subjective Assessment - 02/28/20 1405    Subjective The patient reports she is sore from breaking up a dog fight.    Pertinent History 3 C-sections; repair of rectus diastasis; falls with fx coccyx; gall bladder surgery; breast augmentation; LBP x >15 yrs; Rt lower quadrant pain with palpation    Patient Stated Goals get neck moving and feeling better    Currently in Pain? Yes    Pain Score 7     Pain Location Neck    Pain Orientation Right    Pain Descriptors / Indicators Sore    Pain Type Chronic pain    Pain Onset More than a month ago    Pain Frequency Constant              [facility] PT Assessment - 02/28/20 1407      Assessment   Medical Diagnosis Cervical dysfunction; Lt Hip pain and anterior thigh pain     Referring Provider (PT) Dr Barnetta Chapel Metheney/Dr Dianah Field    Onset Date/Surgical Date 01/21/20                         [facility] Adult PT Treatment/Exercise - 02/28/20 1407      Exercises   Exercises Neck;Shoulder      Neck Exercises: Supine   Neck Retraction 10 reps;5 secs    Other Supine Exercise neck passive and active  extension off edge of mat adding rotation to tolerance.    Other Supine Exercise on boulster performing horizontal abduction with UE to tolerance      Neck Exercises: Sidelying   Lateral Flexion Right;Left;10 reps    Other Sidelying Exercise thoracic open book for mobilization and anterior shoulder stretch      Shoulder Exercises: Sidelying   Other Sidelying Exercises scapular elevation/depression and protraction/retraction      Shoulder Exercises: ROM/Strengthening   UBE (Upper Arm Bike) L2 x 2 minutes forward, .75 minutes backwards with some discomfort      Manual Therapy   Manual Therapy Joint mobilization;Soft tissue mobilization;Manual Traction;Scapular mobilization    Manual therapy comments to reduce trigger points and pain    Joint Mobilization grade II cervical mobs    Soft tissue mobilization STM paraspinal muscles c-spine and upper trap    Scapular Mobilization L sidelying R scapular depression    Manual Traction gentle manual traction x 20 second holds                  PT Education - 02/28/20 1449  Education Details HEP    Person(s) Educated Patient    Methods Explanation;Demonstration;Handout    Comprehension Returned demonstration;Verbalized understanding               PT Long Term Goals - 02/28/20 1551      PT LONG TERM GOAL #1   Title Improve posture and alignment with patient to demonstrate improved upright posture with posterior shoulder girdle engaged    Time 6    Period Weeks    Status Achieved      PT LONG TERM GOAL #2   Title Decrease cervical pain with functional activities with patient to report no more than 2-3/10 with looking up    Time 6    Period Weeks    Status Partially Met      PT LONG TERM GOAL #3   Title Increase cervical ROM in extension by 15-20 degrees and Lt lateral flexion/Lt rotation by 3-5 degrees with all cervical motions pain free    Time 6    Period Weeks    Status Partially Met      PT LONG TERM GOAL #4    Title Independent in HEP    Time 6    Period Weeks    Status Achieved      PT LONG TERM GOAL #5   Title Improve FOTO to </= 34% limitation    Time 6    Period Weeks    Status On-going      PT LONG TERM GOAL #6   Title Decrease Lt LE pain by 50-75% allowing patient to ambulate without limp and return to more normal functional activities    Baseline patient's hip pain resolved    Time 6    Period Weeks    Status Achieved      PT LONG TERM GOAL #7   Title Improve core strength and stability with patient independent in HEP for core strength    Baseline patient hip pain resolved    Time 6    Period Weeks    Status Achieved                 Plan - 02/28/20 1452    Clinical Impression Statement The patient has partially met LTGs.  She continues with pain with cervical extension, which may be related to parascapular tightness.  PT worked on scapular mobilization to reduce postural tightness.  Plan to have patient continue HEP and f/u as needed for physical therapy.    Stability/Clinical Decision Making --    Rehab Potential --    PT Frequency 2x / week    PT Duration 6 weeks    PT Treatment/Interventions ADLs/Self Care Home Management;Aquatic Therapy;Cryotherapy;Electrical Stimulation;Iontophoresis 74m/ml Dexamethasone;Moist Heat;Ultrasound;Functional mobility training;Therapeutic activities;Therapeutic exercise;Balance training;Neuromuscular re-education;Patient/family education;Manual techniques;Dry needling;Taping    PT Next Visit Plan postural opening, spinal mobs, strengthening, manual    PT Home Exercise Plan [email or web address]    Consulted and Agree with Plan of Care Patient           Patient will benefit from skilled therapeutic intervention in order to improve the following deficits and impairments:  Decreased range of motion,Increased fascial restricitons,Decreased activity tolerance,Pain,Hypomobility,Impaired flexibility,Improper body mechanics,Decreased mobility,Impaired  sensation,Postural dysfunction  Visit Diagnosis: Cervicalgia  Cervical dysfunction  Abnormal posture  Other symptoms and signs involving the musculoskeletal system     Problem List Patient Active Problem List   Diagnosis Date Noted  . Raynaud's disease 02/18/2020  . IC (interstitial cystitis) 02/18/2020  . Chronic constipation 02/18/2020  .  Chronic left hip pain 01/29/2020  . Cervical pain 12/14/2019  . Facial trauma 12/03/2019  . Poor sleep pattern 11/16/2019  . Chronic insomnia 09/20/2019  . Gastroesophageal reflux disease 05/16/2019  . Hypothyroidism 03/19/2019  . Hyperlipidemia 03/19/2019  . Essential hypertension 03/19/2019  . Facet syndrome, lumbar 03/19/2019  . Menorrhagia 03/19/2019  . 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 Lichen sclerosus 00/93/8182  . Upper airway cough syndrome 03/19/2019  . IUD (intrauterine device) in place 12/06/2016  . Lesion of oral mucosa 08/01/2014  . Depression 05/10/2011    [place], PT 02/28/2020, 3:59 PM  [facility] [place], [place], [address] Phone: [phone]   Fax:  [phone]  Name: ADIVA BOETTNER MRN: [id number] Date of Birth: 02-13-1976

## 2020-03-04 ENCOUNTER — Telehealth: Payer: Self-pay | Admitting: Family Medicine

## 2020-03-04 ENCOUNTER — Other Ambulatory Visit: Payer: Self-pay | Admitting: Family Medicine

## 2020-03-04 DIAGNOSIS — I7300 Raynaud's syndrome without gangrene: Secondary | ICD-10-CM

## 2020-03-04 MED ORDER — AMLODIPINE BESYLATE 5 MG PO TABS: 5.0000 mg | ORAL_TABLET | Freq: Every day | ORAL | 1 refills | Status: DC

## 2020-03-04 MED FILL — AMLODIPINE BESYLATE 5 MG TA: 5 | 60 days supply | Qty: 60 | Fill #0

## 2020-03-04 NOTE — Telephone Encounter (Signed)
Reports that her Raynaud's symptoms are becoming more frequent specially through these winter months occurring daily often times twice a day especially driving to and to work and home.  Recommend trial of calcium channel blocker either nifedipine or amlodipine.  Prefer amlodipine because of its longer acting properties.  We will send over 5 mg which is usually the starting dose for Raynaud's.  Can split tab in half if feeling like blood pressures going a little too low will need to monitor carefully.

## 2020-03-11 ENCOUNTER — Other Ambulatory Visit: Payer: Self-pay | Admitting: Sports Medicine

## 2020-03-11 DIAGNOSIS — M47816 Spondylosis without myelopathy or radiculopathy, lumbar region: Secondary | ICD-10-CM

## 2020-03-11 MED ORDER — GABAPENTIN 300 MG PO CAPS: 300.0000 mg | ORAL_CAPSULE | Freq: Every day | ORAL | 3 refills | Status: DC

## 2020-03-11 MED FILL — GABAPENTIN 300 MG CAPSULE: 300 | 90 days supply | Qty: 90 | Fill #0

## 2020-03-13 LAB — COMPLETE METABOLIC PANEL WITH GFR
AG Ratio: 2 (calc) (ref 1.0–2.5)
ALT: 28 U/L (ref 6–29)
AST: 25 U/L (ref 10–30)
Albumin: 4.2 g/dL (ref 3.6–5.1)
Alkaline phosphatase (APISO): 58 U/L (ref 31–125)
BUN: 14 mg/dL (ref 7–25)
CO2: 22 mmol/L (ref 20–32)
Calcium: 9 mg/dL (ref 8.6–10.2)
Chloride: 103 mmol/L (ref 98–110)
Creat: 0.63 mg/dL (ref 0.50–1.10)
GFR, Est African American: 127 mL/min/{1.73_m2} (ref >=60)
GFR, Est Non African American: 110 mL/min/{1.73_m2} (ref >=60)
Globulin: 2.1 g/dL (calc) (ref 1.9–3.7)
Glucose, Bld: 91 mg/dL (ref 65–99)
Potassium: 4 mmol/L (ref 3.5–5.3)
Sodium: 136 mmol/L (ref 135–146)
Total Bilirubin: 0.5 mg/dL (ref 0.2–1.2)
Total Protein: 6.3 g/dL (ref 6.1–8.1)

## 2020-03-13 LAB — LIPID PANEL
Cholesterol: 140 mg/dL (ref <200)
HDL: 58 mg/dL (ref >=50)
LDL Cholesterol (Calc): 68 mg/dL (calc)
Non-HDL Cholesterol (Calc): 82 mg/dL (calc) (ref <130)
Total CHOL/HDL Ratio: 2.4 (calc) (ref <5.0)
Triglycerides: 59 mg/dL (ref <150)

## 2020-03-13 LAB — HEPATITIS C ANTIBODY
Hepatitis C Ab: NONREACTIVE
SIGNAL TO CUT-OFF: 0.1 (ref <1.00)

## 2020-03-13 LAB — CBC
HCT: 40.4 % (ref 35.0–45.0)
Hemoglobin: 13.7 g/dL (ref 11.7–15.5)
MCH: 32.3 pg (ref 27.0–33.0)
MCHC: 33.9 g/dL (ref 32.0–36.0)
MCV: 95.3 fL (ref 80.0–100.0)
MPV: 10.6 fL (ref 7.5–12.5)
Platelets: 273 10*3/uL (ref 140–400)
RBC: 4.24 10*6/uL (ref 3.80–5.10)
RDW: 12.1 % (ref 11.0–15.0)
WBC: 4.4 10*3/uL (ref 3.8–10.8)

## 2020-03-13 LAB — C-REACTIVE PROTEIN: CRP: 1.6 mg/L (ref <8.0)

## 2020-03-13 LAB — TSH+FREE T4: TSH W/REFLEX TO FT4: 0.64 mIU/L

## 2020-03-13 LAB — SEDIMENTATION RATE: Sed Rate: 2 mm/h (ref 0–20)

## 2020-03-13 LAB — ANTI-NUCLEAR AB-TITER (ANA TITER)
ANA TITER: 1:40 {titer} — ABNORMAL HIGH
ANA Titer 1: 1:80 {titer} — ABNORMAL HIGH

## 2020-03-13 LAB — ANA: Anti Nuclear Antibody (ANA): POSITIVE — AB

## 2020-03-17 ENCOUNTER — Other Ambulatory Visit: Payer: Self-pay | Admitting: Family Medicine

## 2020-03-17 DIAGNOSIS — I7300 Raynaud's syndrome without gangrene: Secondary | ICD-10-CM

## 2020-03-17 MED ORDER — AMLODIPINE BESYLATE 5 MG PO TABS
2.5000 mg | ORAL_TABLET | ORAL | 0 refills | Status: DC
Start: 2020-03-17 — End: 2020-05-01

## 2020-03-20 ENCOUNTER — Ambulatory Visit (INDEPENDENT_AMBULATORY_CARE_PROVIDER_SITE_OTHER): Payer: No Typology Code available for payment source | Admitting: Rehabilitative and Restorative Service Providers"

## 2020-03-20 ENCOUNTER — Other Ambulatory Visit: Payer: Self-pay

## 2020-03-20 DIAGNOSIS — R29898 Other symptoms and signs involving the musculoskeletal system: Secondary | ICD-10-CM | POA: Diagnosis not present

## 2020-03-20 DIAGNOSIS — M542 Cervicalgia: Secondary | ICD-10-CM | POA: Diagnosis not present

## 2020-03-20 DIAGNOSIS — R293 Abnormal posture: Secondary | ICD-10-CM

## 2020-03-20 DIAGNOSIS — M539 Dorsopathy, unspecified: Secondary | ICD-10-CM

## 2020-03-20 NOTE — Therapy (Addendum)
[place], [place], [address] Phone: [phone]   Fax:  [phone]  Physical Therapy Treatment and Discharge Summary  Patient Details  Name: April Webster MRN: [id number] Date of Birth: 1975/07/30 Referring Provider (PT): Dr Barnetta Chapel Metheney/Dr Dianah Field   Encounter Date: 03/20/2020   PT End of Session - 03/20/20 1409    Visit Number 10    Number of Visits 22    Date for PT Re-Evaluation 05/01/20    PT Start Time 4076    PT Stop Time 1445    PT Time Calculation (min) 42 min    Activity Tolerance Patient tolerated treatment well;Patient limited by pain    Behavior During Therapy [facility] for tasks assessed/performed           No past medical history on file.  Past Surgical History:  Procedure Laterality Date  . ABDOMINOPLASTY    . PLACEMENT OF BREAST IMPLANTS      There were no vitals filed for this visit.   Subjective Assessment - 03/20/20 1404    Subjective The patient got pulled by her dog and fell onto her left side.  She notes neck has been sore since this incident.    Pertinent History 3 C-sections; repair of rectus diastasis; falls with fx coccyx; gall bladder surgery; breast augmentation; LBP x >15 yrs; Rt lower quadrant pain with palpation    Patient Stated Goals get neck moving and feeling better    Currently in Pain? Yes    Pain Score 4    at rest, movement and late in evening it increases   Pain Location Neck    Pain Orientation Right    Pain Descriptors / Indicators Sore    Pain Type Acute pain;Chronic pain    Pain Onset More than a month ago    Pain Frequency Constant    Aggravating Factors  neck movement    Pain Relieving Factors rest              [facility] PT Assessment - 03/20/20 1436      Assessment   Medical Diagnosis Cervical dysfunction; Lt Hip pain and anterior thigh pain     Referring Provider (PT) Dr Barnetta Chapel Metheney/Dr Dianah Field    Onset  Date/Surgical Date 01/21/20    Hand Dominance Right    Next MD Visit PRN    Prior Therapy yes for LBP       Palpation   Palpation comment significant muscle guarding and tightness in suboccipitals (more painful R), paraspinal musculature, levator, upper trap                         [facility] Adult PT Treatment/Exercise - 03/20/20 1436      Modalities   Modalities Electrical Stimulation;Moist Heat      Moist Heat Therapy   Number Minutes Moist Heat 12 Minutes    Moist Heat Location Cervical      Electrical Stimulation   Electrical Stimulation Location R cervical    Electrical Stimulation Action interferential    Electrical Stimulation Parameters to tolerance    Electrical Stimulation Goals Pain      Manual Therapy   Manual Therapy Joint mobilization;Soft tissue mobilization;Manual Traction    Manual therapy comments to reduce trigger points and pain    Joint Mobilization grade II-III PA mobs in supine for c-spine, lateral mobs grade II    Soft tissue mobilization STM paraspinal muscles, upper trap, splenius capitus,  suboccipitals    Manual Traction gentle manual traction x 20 second holds                       PT Long Term Goals - 03/20/20 1439      PT LONG TERM GOAL #1   Title The patient will be able to return to prior HEP after acute injury to neck.    Time 6    Period Weeks    Target Date 05/01/20      PT LONG TERM GOAL #2   Title The patient will report < 4/10 pain with neck rotation.    Time 6    Period Weeks    Target Date 05/01/20                 Plan - 03/20/20 1621    Clinical Impression Statement The patient was on hold from PT working on HEP for chronic neck pain.  She had an injury after her dog pulled and she provided a counterpull falling onto her left side.  She has had increased neck pain with all motion and increased neck tightness.  PT updated plan of care, however focused today on STM, and modalities to reduce muscle  guarding.    PT Frequency 2x / week    PT Duration 6 weeks    PT Treatment/Interventions ADLs/Self Care Home Management;Aquatic Therapy;Cryotherapy;Electrical Stimulation;Iontophoresis 44m/ml Dexamethasone;Moist Heat;Ultrasound;Functional mobility training;Therapeutic activities;Therapeutic exercise;Balance training;Neuromuscular re-education;Patient/family education;Manual techniques;Dry needling;Taping    PT Next Visit Plan modalities for pain prn, STM (no DN), mobilization to tolerance, and return to prior HEP as able.    PT Home Exercise Plan [email or web address]    Consulted and Agree with Plan of Care Patient           Patient will benefit from skilled therapeutic intervention in order to improve the following deficits and impairments:     Visit Diagnosis: Cervicalgia  Cervical dysfunction  Abnormal posture  Other symptoms and signs involving the musculoskeletal system     Problem List Patient Active Problem List   Diagnosis Date Noted  . Raynaud's disease 02/18/2020  . IC (interstitial cystitis) 02/18/2020  . Chronic constipation 02/18/2020  . Chronic left hip pain 01/29/2020  . Cervical pain 12/14/2019  . Facial trauma 12/03/2019  . Poor sleep pattern 11/16/2019  . Chronic insomnia 09/20/2019  . Gastroesophageal reflux disease 05/16/2019  . Hypothyroidism 03/19/2019  . Hyperlipidemia 03/19/2019  . Essential hypertension 03/19/2019  . Facet syndrome, lumbar 03/19/2019  . Menorrhagia 03/19/2019  . 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 Lichen sclerosus 002/40/9735 . Upper airway cough syndrome 03/19/2019  . IUD (intrauterine device) in place 12/06/2016  . Lesion of oral mucosa 08/01/2014  . Depression 05/10/2011   PHYSICAL THERAPY DISCHARGE SUMMARY  Visits from Start of Care: 10  Current functional level related to goals / functional outcomes: See goals above   Remaining deficits: See above status for last known status   Education / Equipment: HEP  Plan:                                                     Patient goals were partially met. Patient is being discharged due to not returning since the last visit.  ?????          Thank you for the  referral of this patient. Rudell Cobb, MPT  [place], [place] 03/20/2020, 6:35 PM  [facility] [place], [place], [address] Phone: [phone]   Fax:  [phone]  Name: PANHIA KARL MRN: [id number] Date of Birth: February 20, 1976

## 2020-03-27 ENCOUNTER — Ambulatory Visit (INDEPENDENT_AMBULATORY_CARE_PROVIDER_SITE_OTHER): Payer: No Typology Code available for payment source | Admitting: Family Medicine

## 2020-03-27 ENCOUNTER — Encounter: Payer: No Typology Code available for payment source | Admitting: Physical Therapy

## 2020-03-27 ENCOUNTER — Other Ambulatory Visit: Payer: Self-pay

## 2020-03-27 DIAGNOSIS — R0981 Nasal congestion: Secondary | ICD-10-CM

## 2020-03-27 DIAGNOSIS — J3489 Other specified disorders of nose and nasal sinuses: Secondary | ICD-10-CM

## 2020-03-27 NOTE — Progress Notes (Signed)
She reports started not feeling well last night getting a little bit of nasal congestion also just feeling tired.she has also been grieving for the loss of a friends. So has been tearful.   Beatrice Lecher, MD

## 2020-03-27 NOTE — Progress Notes (Signed)
Lab only 

## 2020-03-28 LAB — SPECIMEN STATUS REPORT

## 2020-04-04 ENCOUNTER — Other Ambulatory Visit: Payer: Self-pay | Admitting: Family Medicine

## 2020-04-04 ENCOUNTER — Telehealth: Payer: Self-pay | Admitting: Family Medicine

## 2020-04-04 MED ORDER — AZITHROMYCIN 250 MG PO TABS: ORAL_TABLET | ORAL | 0 refills | Status: DC

## 2020-04-04 MED FILL — AZITHROMYCIN 250 MG TABLET: 250 | 5 days supply | Qty: 6 | Fill #0

## 2020-04-04 NOTE — Telephone Encounter (Signed)
Patient reports sinus congestion for almost 3 weeks its been occurring predominantly in the evenings and overnight and only seems to get a little bit better during the day.  She has not had any fevers chills or sweats.  The congestion has gotten progressively worse over this last week.  Though she was also potentially exposed to [place] through work on Wednesday.  Medical head and treat her for sinusitis in addition to recommending COVID testing through work. Zpack sent to pharmacy.   Beatrice Lecher, MD'

## 2020-04-10 ENCOUNTER — Other Ambulatory Visit: Payer: Self-pay | Admitting: Family Medicine

## 2020-04-10 ENCOUNTER — Telehealth: Payer: Self-pay | Admitting: Family Medicine

## 2020-04-10 MED ORDER — DOXYCYCLINE HYCLATE 100 MG PO TABS: 100.0000 mg | ORAL_TABLET | Freq: Two times a day (BID) | ORAL | 0 refills | Status: DC

## 2020-04-10 MED ORDER — FLUCONAZOLE 150 MG PO TABS: 150.0000 mg | ORAL_TABLET | Freq: Once | ORAL | 0 refills | Status: DC

## 2020-04-10 MED FILL — DOXYCYCLINE HYCLATE 100 MG: 100 | 10 days supply | Qty: 20 | Fill #0

## 2020-04-10 MED FILL — FLUCONAZOLE 150 MG TABS: 150 | 1 days supply | Qty: 1 | Fill #0

## 2020-04-10 NOTE — Telephone Encounter (Signed)
Patient reports that she actually started to feel better on the azithromycin but not completely within 24 hours of stopping the antibiotic she she has been feeling more congested and starting to feel worse again.  New prescription for doxycycline sent to pharmacy.

## 2020-04-21 ENCOUNTER — Other Ambulatory Visit: Payer: Self-pay | Admitting: Family Medicine

## 2020-04-21 DIAGNOSIS — Z1231 Encounter for screening mammogram for malignant neoplasm of breast: Secondary | ICD-10-CM

## 2020-04-24 ENCOUNTER — Ambulatory Visit (INDEPENDENT_AMBULATORY_CARE_PROVIDER_SITE_OTHER): Payer: No Typology Code available for payment source

## 2020-04-24 ENCOUNTER — Other Ambulatory Visit: Payer: Self-pay

## 2020-04-24 DIAGNOSIS — Z1231 Encounter for screening mammogram for malignant neoplasm of breast: Secondary | ICD-10-CM | POA: Diagnosis not present

## 2020-04-24 IMAGING — MG DIGITAL SCREENING BREAST BILAT IMPLANT W/ TOMO W/ CAD
8 of 12 series · 8 of 28 positions shown · non-contrast
Comparison: None.

CLINICAL DATA: Screening.

EXAM:
DIGITAL SCREENING BILATERAL MAMMOGRAM WITH IMPLANTS, CAD AND
TOMOSYNTHESIS
TECHNIQUE: Bilateral screening digital craniocaudal and mediolateral oblique
mammograms were obtained. Bilateral screening digital breast
tomosynthesis was performed. The images were evaluated with
computer-aided detection. Standard and/or implant displaced views
were performed.

[L CC]
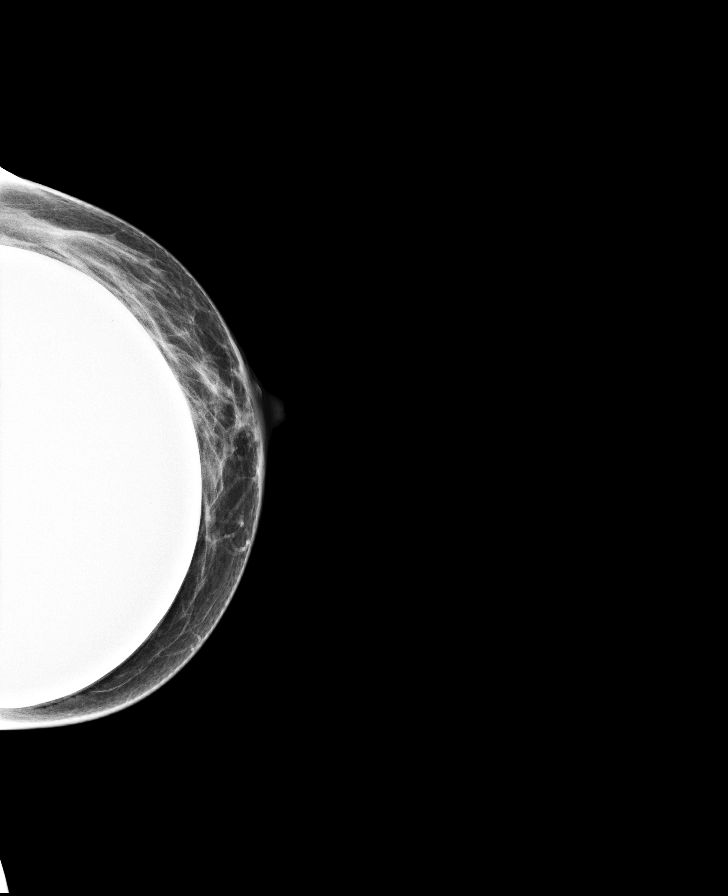

[R MLO]
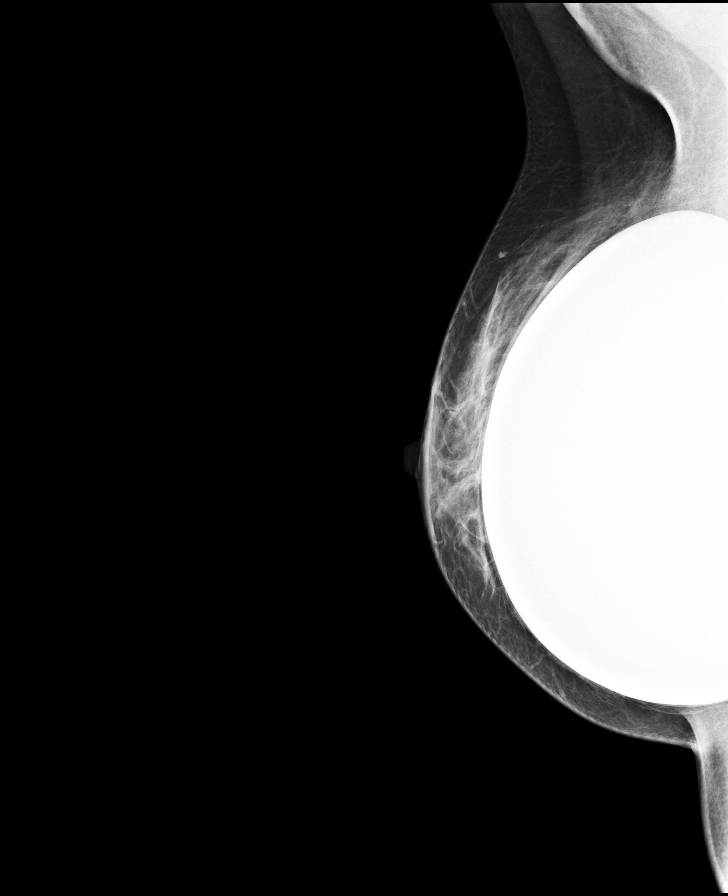

[L MLO]
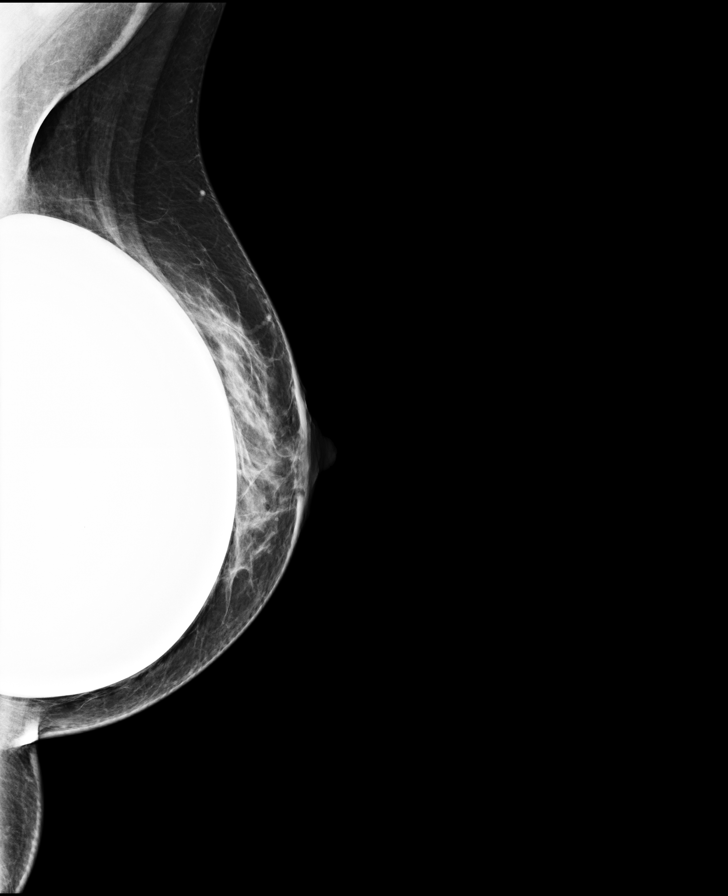

[R CC]
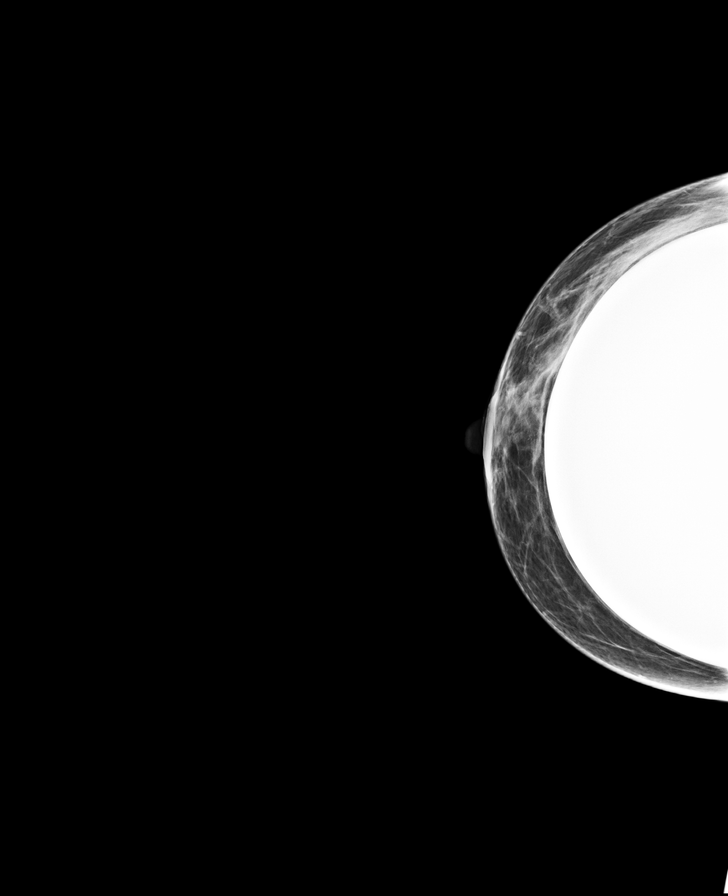

[L CC synth-2D]
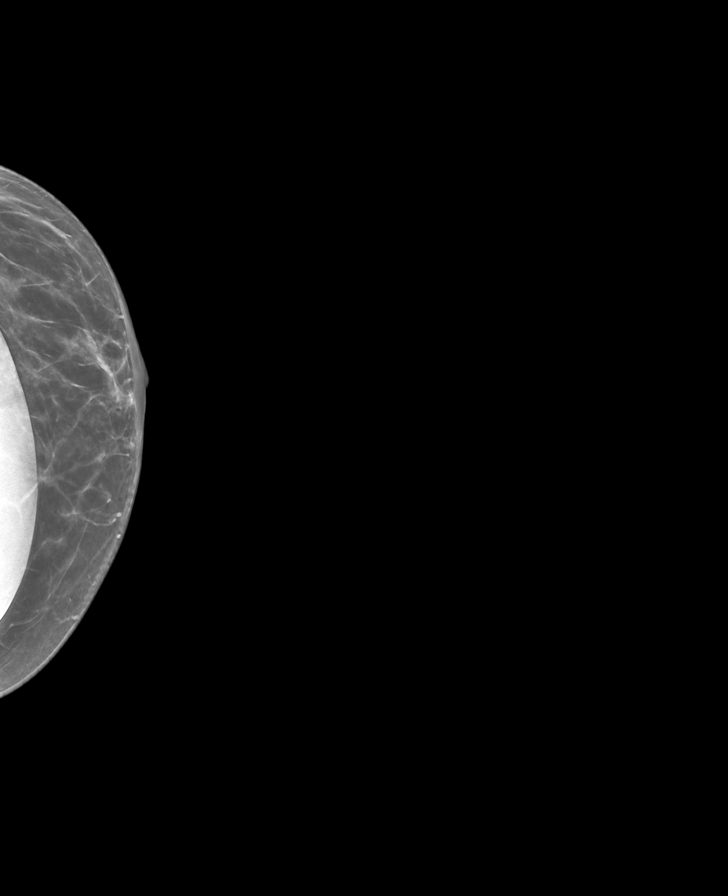

[L MLO synth-2D]
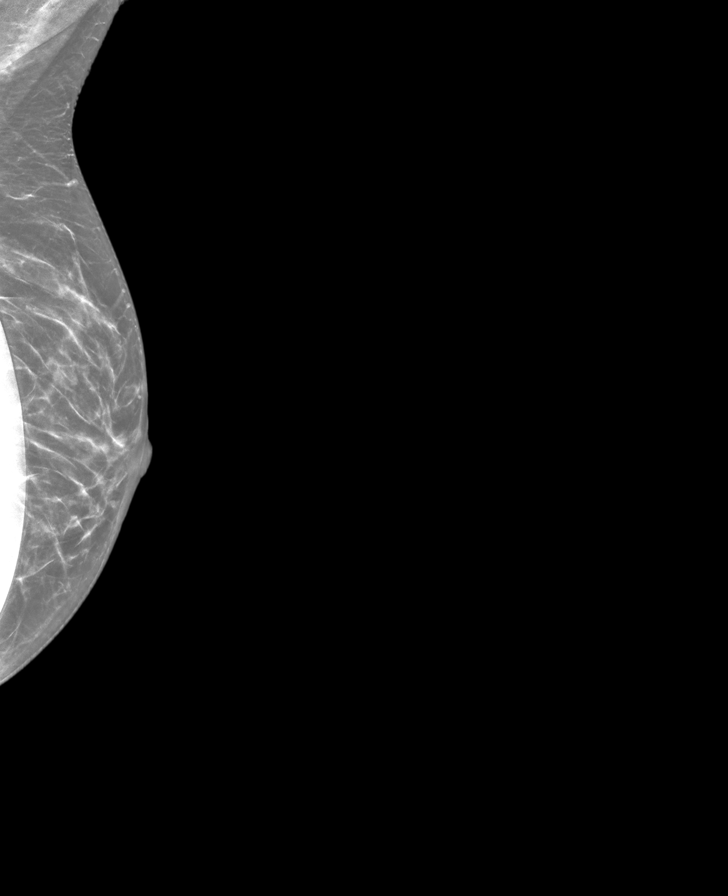

[R CC synth-2D]
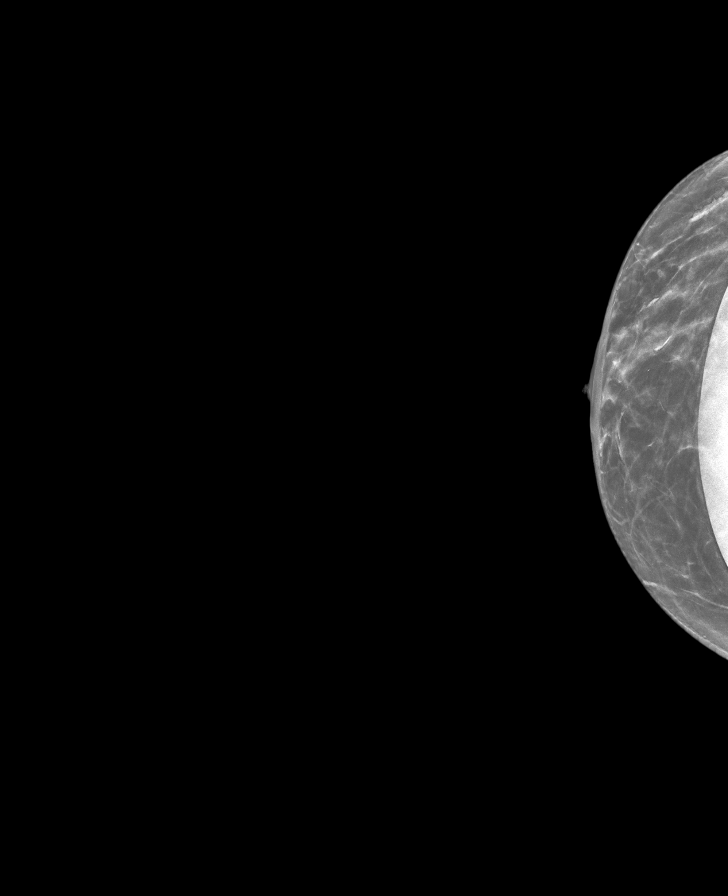

[R MLO synth-2D]
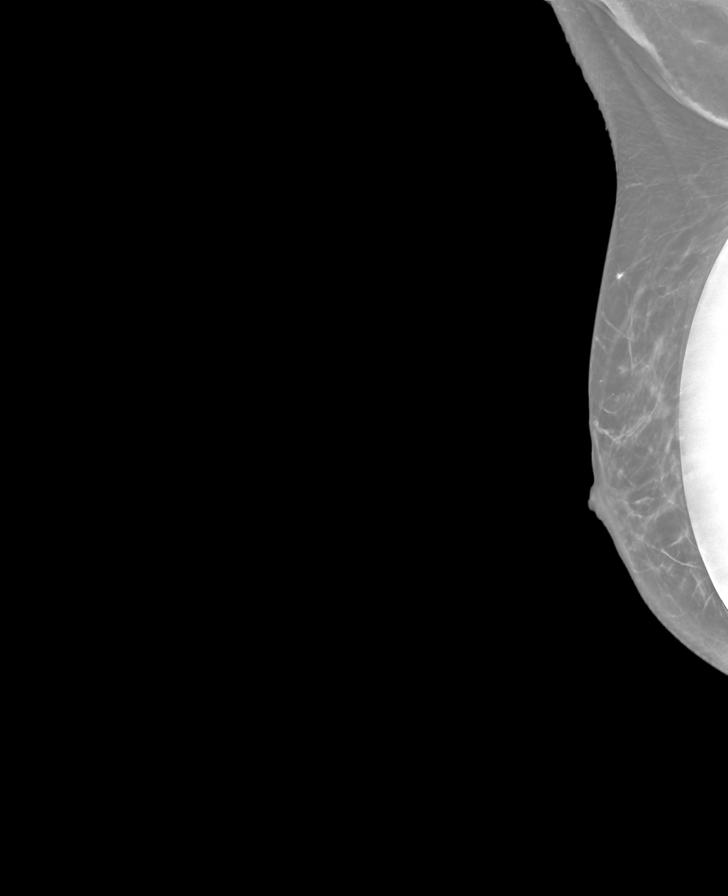

[8 of 28 positions shown; findings below may reference images not displayed]

ACR Breast Density Category b: There are scattered areas of
fibroglandular density.
FINDINGS: The patient has retropectoral implants. There are no findings
suspicious for malignancy. The images were evaluated with
computer-aided detection.
IMPRESSION: No mammographic evidence of malignancy. A result letter of this
screening mammogram will be mailed directly to the patient.

RECOMMENDATION:
Screening mammogram in one year. (Code:[N9])

BI-RADS CATEGORY  1:  Negative.

## 2020-05-01 ENCOUNTER — Ambulatory Visit (INDEPENDENT_AMBULATORY_CARE_PROVIDER_SITE_OTHER): Payer: No Typology Code available for payment source | Admitting: Family Medicine

## 2020-05-01 ENCOUNTER — Encounter: Payer: Self-pay | Admitting: Family Medicine

## 2020-05-01 ENCOUNTER — Other Ambulatory Visit: Payer: Self-pay | Admitting: Family Medicine

## 2020-05-01 VITALS — BP 125/87 | HR 82 | Temp 99.0°F | Resp 18 | Ht 63.0 in | Wt 132.2 lb

## 2020-05-01 DIAGNOSIS — I7300 Raynaud's syndrome without gangrene: Secondary | ICD-10-CM

## 2020-05-01 DIAGNOSIS — Z0000 Encounter for general adult medical examination without abnormal findings: Secondary | ICD-10-CM

## 2020-05-01 DIAGNOSIS — K5909 Other constipation: Secondary | ICD-10-CM | POA: Diagnosis not present

## 2020-05-01 MED ORDER — LINACLOTIDE 72 MCG PO CAPS: 72.0000 ug | ORAL_CAPSULE | Freq: Every day | ORAL | 0 refills | Status: DC

## 2020-05-01 MED ORDER — PHENTERMINE HCL 15 MG PO CAPS: 15.0000 mg | ORAL_CAPSULE | ORAL | 1 refills | Status: DC

## 2020-05-01 MED ORDER — AMLODIPINE BESYLATE 2.5 MG PO TABS: 2.5000 mg | ORAL_TABLET | Freq: Every day | ORAL | 1 refills | Status: DC

## 2020-05-01 MED FILL — LINZESS 72 MCG CAPSULE: 72 | 90 days supply | Qty: 90 | Fill #0

## 2020-05-01 MED FILL — PHENTERMINE HCL 15 MG CAPS: 15 | 30 days supply | Qty: 30 | Fill #0

## 2020-05-01 NOTE — Assessment & Plan Note (Signed)
The Linzess has been most too powerful so she is only been taking it once a week.  She would like to try the lowest dose possible.  New prescription sent for 72 mcg.

## 2020-05-01 NOTE — Assessment & Plan Note (Signed)
Well on low-dose amlodipine.  Instead of splitting the 5 mg tab she would like to have a 2.5 mg sent to the pharmacy.  New prescription sent today.

## 2020-05-01 NOTE — Progress Notes (Signed)
Subjective:     April Webster is a 45 y.o. female and is here for a comprehensive physical exam. The patient reports no problems.  She recently had labs in December.  Lipid panel was normal.  Complete metabolic panel looked great.  CBC was normal.  Negative for hepatitis C. she reports poor sleep quality in part because her husband snores.  But he will not get evaluated for sleep apnea.  So during the week she only gets about 4 to 5 hours of sleep.  Social History   Socioeconomic History  . Marital status: Married    Spouse name: Not on file  . Number of children: Not on file  . Years of education: Not on file  . Highest education level: Not on file  Occupational History  . Not on file  Tobacco Use  . Smoking status: Former Smoker    Packs/day: 2.00    Years: 5.00    Pack years: 10.00    Types: Cigarettes    Quit date: 03/15/1996    Years since quitting: 24.1  . Smokeless tobacco: Never Used  Substance and Sexual Activity  . Alcohol use: Yes    Alcohol/week: 2.0 standard drinks    Types: 2 Glasses of wine per week  . Drug use: Never  . Sexual activity: Yes    Partners: Male  Other Topics Concern  . Not on file  Social History Narrative  . Not on file   Social Determinants of Health   Financial Resource Strain: Not on file  Food Insecurity: Not on file  Transportation Needs: Not on file  Physical Activity: Not on file  Stress: Not on file  Social Connections: Not on file  Intimate Partner Violence: Not on file   Health Maintenance  Topic Date Due  . HIV Screening  Never done  . COVID-19 Vaccine (3 - Booster for Pfizer series) 05/04/2020  . PAP SMEAR-Modifier  12/06/2021  . TETANUS/TDAP  04/29/2027  . INFLUENZA VACCINE  Completed  . Hepatitis C Screening  Completed    The following portions of the patient's history were reviewed and updated as appropriate: allergies, current medications, past family history, past medical history, past social history, past surgical  history and problem list.  Review of Systems A comprehensive review of systems was negative.   Objective:    BP 125/87   Pulse 82   Temp 99 F (37.2 C)   Resp 18   Ht 5' 3"  (1.6 m)   Wt 132 lb 3.2 oz (60 kg)   SpO2 99%   BMI 23.42 kg/m  General appearance: alert, cooperative and appears stated age Head: Normocephalic, without obvious abnormality, atraumatic Eyes: conj clear, EOMi, PEERLA Ears: normal TM's and external ear canals both ears Nose: Nares normal. Septum midline. Mucosa normal. No drainage or sinus tenderness. Throat: lips, mucosa, and tongue normal; teeth and gums normal Neck: no adenopathy, no carotid bruit, no JVD, supple, symmetrical, trachea midline and thyroid not enlarged, symmetric, no tenderness/mass/nodules Back: negative Lungs: clear to auscultation bilaterally Heart: regular rate and rhythm, S1, S2 normal, no murmur, click, rub or gallop Abdomen: soft, non-tender; bowel sounds normal; no masses,  no organomegaly and tender spot in the right mid abdomen near surgical scar. Extremities: extremities normal, atraumatic, no cyanosis or edema Pulses: 2+ and symmetric Skin: Skin color, texture, turgor normal. No rashes or lesions Lymph nodes: Cervical adenopathy: nl and Supraclavicular adenopathy: nl Neurologic: Alert and oriented X 3, normal strength and tone. Normal symmetric  reflexes. Normal coordination and gait    Assessment:    Healthy female exam.     Plan:     See After Visit Summary for Counseling Recommendations   Keep up a regular exercise program and make sure you are eating a healthy diet Try to eat 4 servings of dairy a day, or if you are lactose intolerant take a calcium with vitamin D daily.  Your vaccines are up to date.  Labs are up-to-date. Pap smear is up-to-date and she has scheduled a follow-up with GYN later this year.  Raynaud's disease Well on low-dose amlodipine.  Instead of splitting the 5 mg tab she would like to have a 2.5  mg sent to the pharmacy.  New prescription sent today.  Chronic constipation The Linzess has been most too powerful so she is only been taking it once a week.  She would like to try the lowest dose possible.  New prescription sent for 72 mcg.    Discussed that since upping the phentermine she has started to gain a little bit of weight back she just feels like she can barely eat anything extra or she gains it back very quickly within days.discussed prn Phentermine on the days she feels very hungry.  Encouraged her to start an exercise routine.

## 2020-05-02 ENCOUNTER — Other Ambulatory Visit: Payer: Self-pay | Admitting: *Deleted

## 2020-05-02 DIAGNOSIS — I7300 Raynaud's syndrome without gangrene: Secondary | ICD-10-CM

## 2020-05-02 MED ORDER — AMLODIPINE BESYLATE 2.5 MG PO TABS: 2.5000 mg | ORAL_TABLET | Freq: Every day | ORAL | 1 refills | Status: DC

## 2020-05-02 MED FILL — AMLODIPINE 2.5 MG TABLET: 2.5 | 90 days supply | Qty: 90 | Fill #0

## 2020-05-06 ENCOUNTER — Other Ambulatory Visit: Payer: Self-pay | Admitting: Family Medicine

## 2020-05-06 MED FILL — FINASTERIDE 5 MG TABLET: 5 | 90 days supply | Qty: 45 | Fill #0

## 2020-05-06 MED FILL — SYNTHROID 112 MCG TABLET: 112 | 90 days supply | Qty: 90 | Fill #0

## 2020-05-06 MED FILL — ATORVASTATIN CALCIUM 20 MG: 20 | 90 days supply | Qty: 90 | Fill #0

## 2020-05-06 MED FILL — MONTELUKAST SOD 10 MG TAB: 10 | 90 days supply | Qty: 90 | Fill #0

## 2020-05-06 MED FILL — MELOXICAM 15 MG TABLET: 15 | 90 days supply | Qty: 90 | Fill #0

## 2020-05-16 ENCOUNTER — Other Ambulatory Visit: Payer: Self-pay

## 2020-05-16 ENCOUNTER — Other Ambulatory Visit: Payer: Self-pay | Admitting: Sports Medicine

## 2020-05-16 ENCOUNTER — Ambulatory Visit (INDEPENDENT_AMBULATORY_CARE_PROVIDER_SITE_OTHER): Payer: No Typology Code available for payment source

## 2020-05-16 ENCOUNTER — Telehealth: Payer: Self-pay | Admitting: Sports Medicine

## 2020-05-16 ENCOUNTER — Telehealth: Payer: Self-pay | Admitting: Family Medicine

## 2020-05-16 DIAGNOSIS — M542 Cervicalgia: Secondary | ICD-10-CM

## 2020-05-16 IMAGING — DX DG CERVICAL SPINE COMPLETE 4+V
6 series · 6 of 6 positions shown · non-contrast
Comparison: None.

CLINICAL DATA: Right greater than left pain with movement.

EXAM:
CERVICAL SPINE - COMPLETE 4+ VIEW

[c-spine lat]
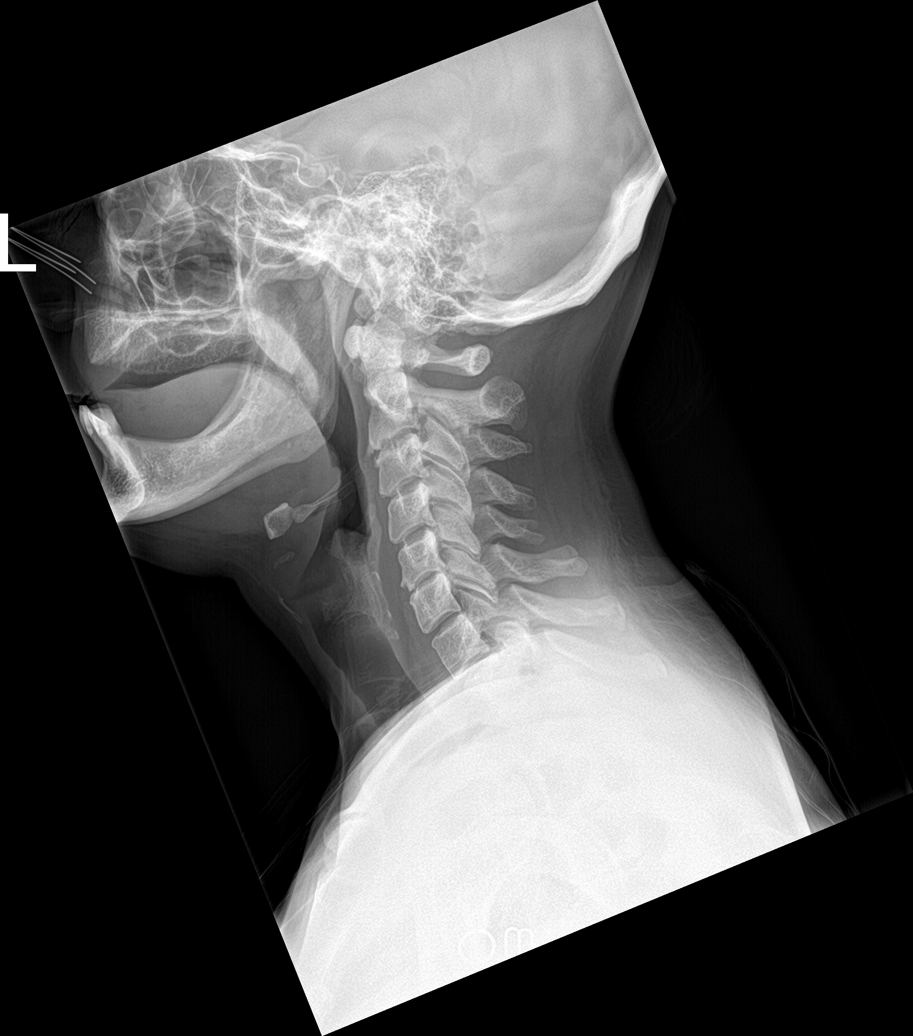

[c-spine obl (1 of 2)]
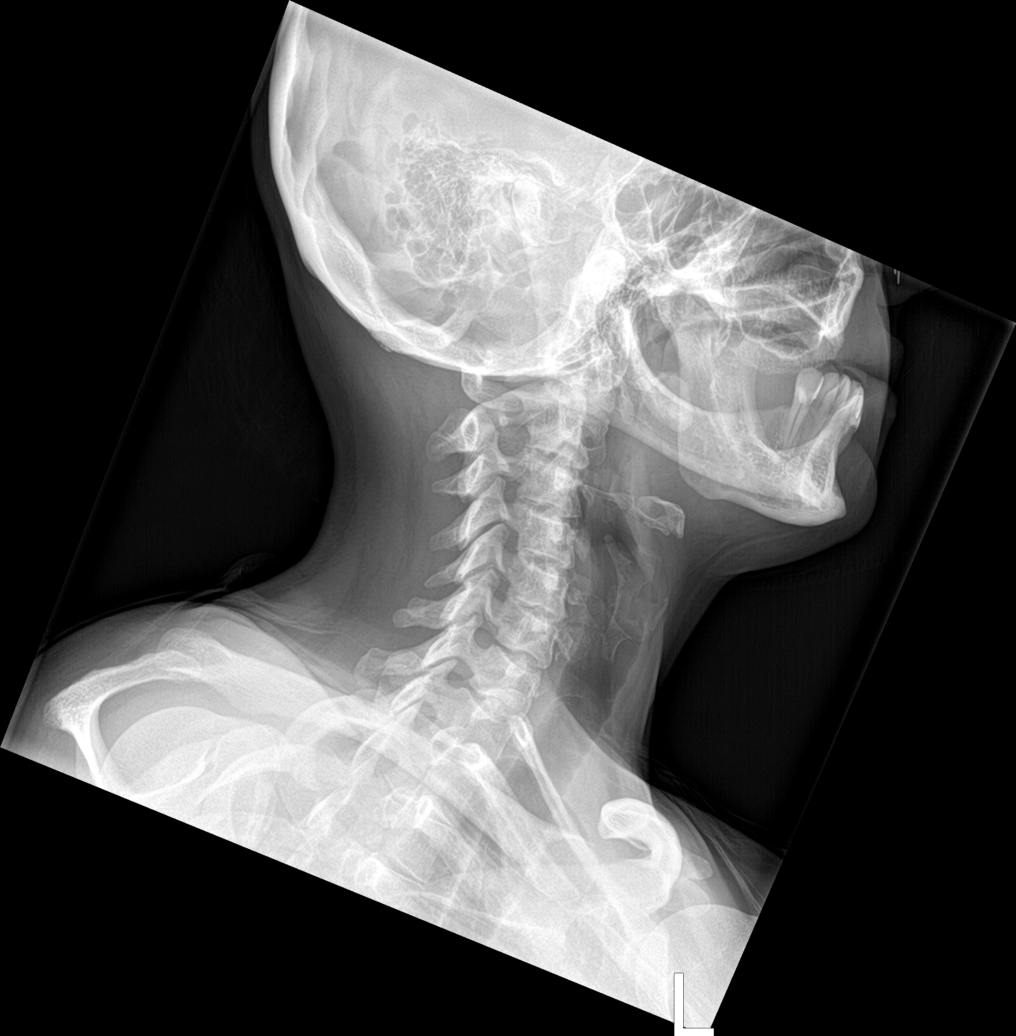

[c-spine obl (2 of 2)]
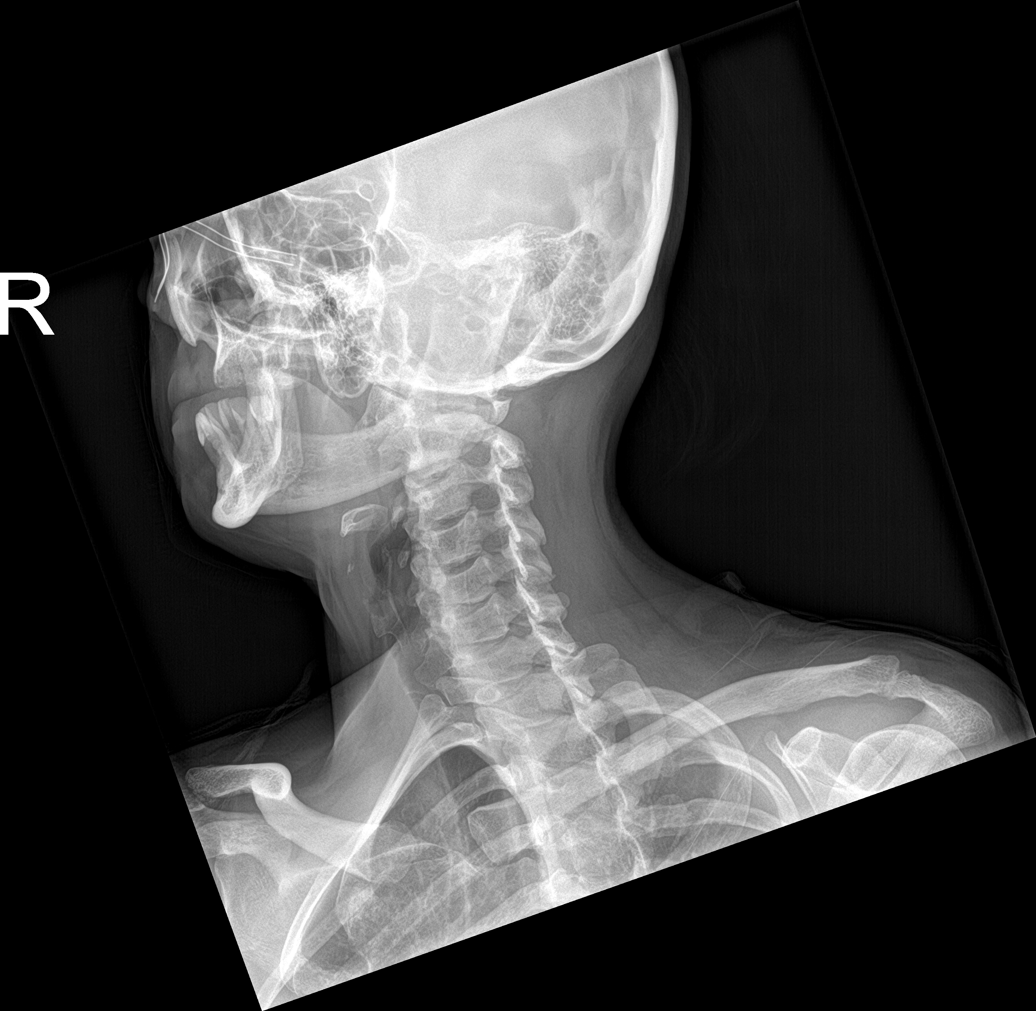

[c-spine ap]
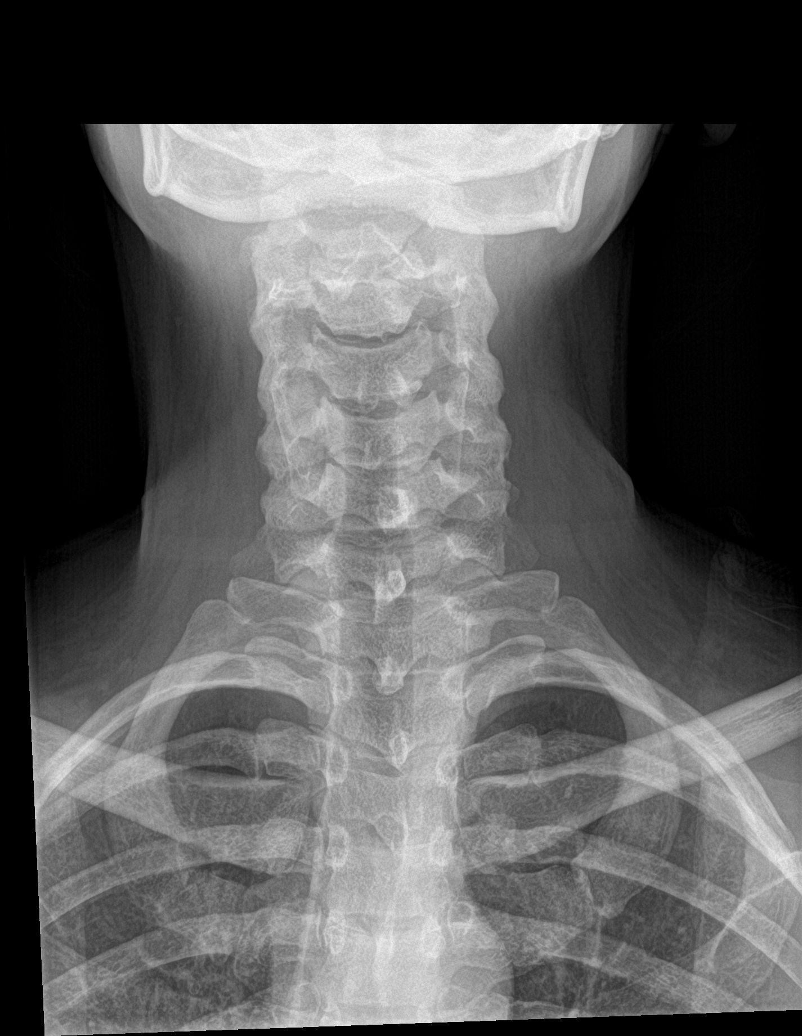

[c-spine open mouth]
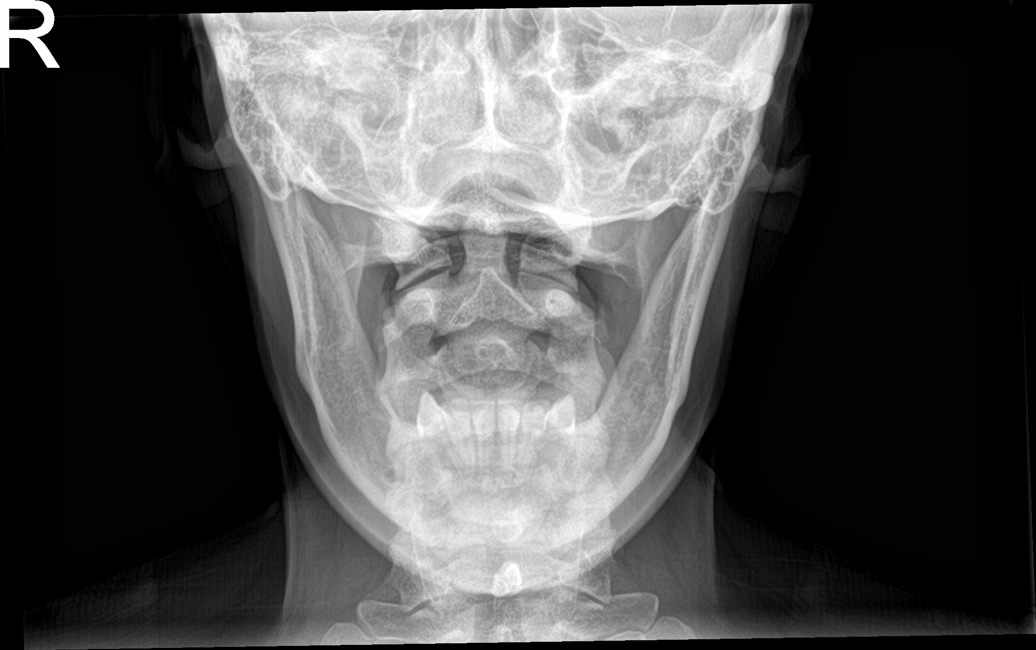

[c-spine swimmers]
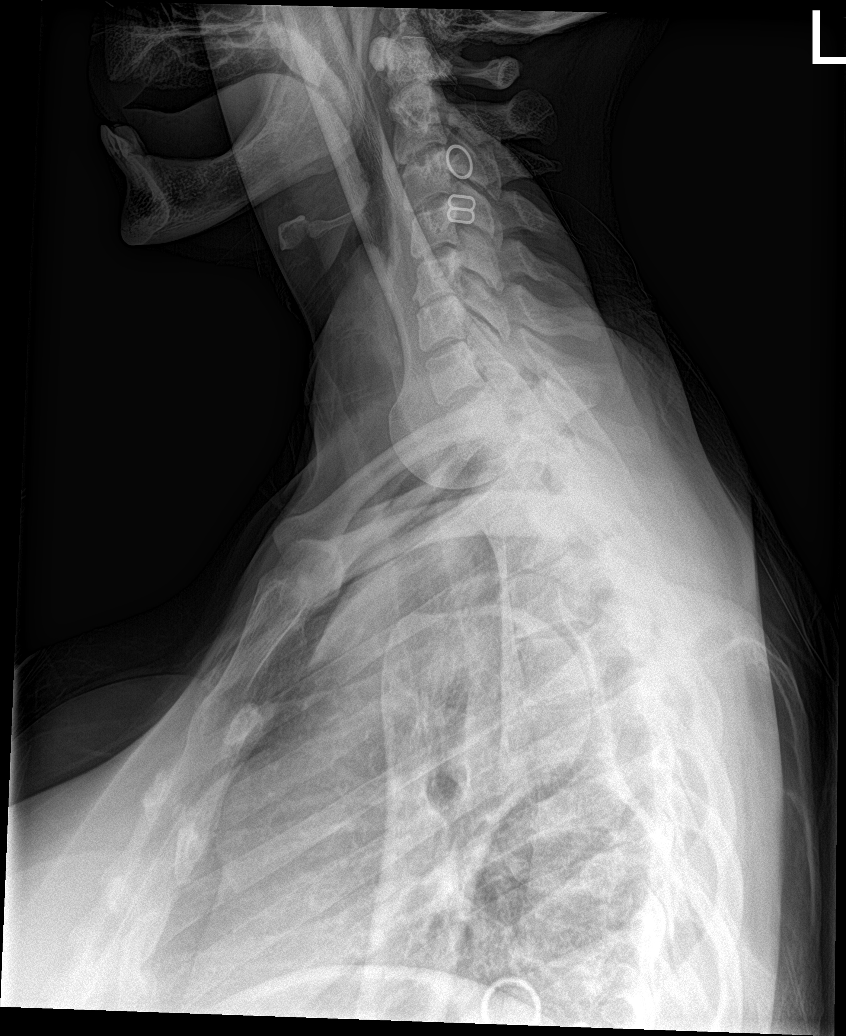

[6 of 6 positions shown; findings below may reference images not displayed]

FINDINGS: No fracture or traumatic malalignment. Mild degenerative changes of
small anterior osteophytes at C2-3 and C5-6. No malalignment. The
prevertebral soft tissues and pre odontoid space are normal. The
neural foramina are patent. No other abnormalities.
IMPRESSION: Mild degenerative changes.  No other abnormalities.

## 2020-05-16 MED ORDER — BACLOFEN 10 MG PO TABS: 10.0000 mg | ORAL_TABLET | Freq: Every evening | ORAL | 3 refills | Status: DC

## 2020-05-16 MED ORDER — TRIAZOLAM 0.25 MG PO TABS: ORAL_TABLET | ORAL | 0 refills | Status: DC

## 2020-05-16 MED FILL — TRIAZOLAM 0.25 MG TABS: 0.25 | 1 days supply | Qty: 2 | Fill #0

## 2020-05-16 MED FILL — BACLOFEN 10 MG TABS: 10 | 90 days supply | Qty: 90 | Fill #0

## 2020-05-16 NOTE — Assessment & Plan Note (Signed)
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 Adding an MRI as well, she has failed greater than 6 weeks of physician directed conservative measures including physical therapy, triazolam for preprocedural anxiolysis.

## 2020-05-16 NOTE — Telephone Encounter (Signed)
Pt needs chiropractic referral fo rher neck

## 2020-05-16 NOTE — Telephone Encounter (Signed)
Cervical pain Likely cervical DDD, finish off 6 days of half dose prednisone, adding baclofen, it is up to joint whether or not she wants to continue gabapentin, adding some x-rays. Adding an MRI as well, she has failed greater than 6 weeks of physician directed conservative measures including physical therapy, triazolam for preprocedural anxiolysis.

## 2020-05-18 ENCOUNTER — Other Ambulatory Visit: Payer: Self-pay

## 2020-05-18 ENCOUNTER — Ambulatory Visit (INDEPENDENT_AMBULATORY_CARE_PROVIDER_SITE_OTHER): Payer: No Typology Code available for payment source

## 2020-05-18 DIAGNOSIS — R519 Headache, unspecified: Secondary | ICD-10-CM

## 2020-05-18 DIAGNOSIS — M542 Cervicalgia: Secondary | ICD-10-CM | POA: Diagnosis not present

## 2020-05-18 IMAGING — MR MR CERVICAL SPINE W/O CM
7 of 8 series · 23 of 48 positions shown · non-contrast
Comparison: Prior radiograph from [DATE].

CLINICAL DATA: Initial evaluation for chronic left-sided neck pain,
headaches.

EXAM:
MRI CERVICAL SPINE WITHOUT CONTRAST
TECHNIQUE: Multiplanar, multisequence MR imaging of the cervical spine was
performed. No intravenous contrast was administered.

[Series 4: T2 · sagittal · 3.0mm · 0.69mm/px · 2 of 13 slices shown (1 of 2)]
[im 1/13]
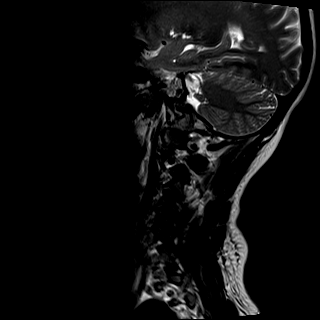
[im 13/13]
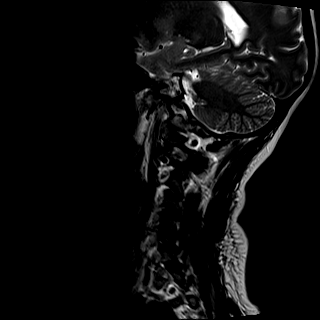

[Series 5: t2_spc_sag_p2_iso · sagittal · 0.9mm · 0.47mm/px · 5 of 60 slices shown]
[im 1/60]
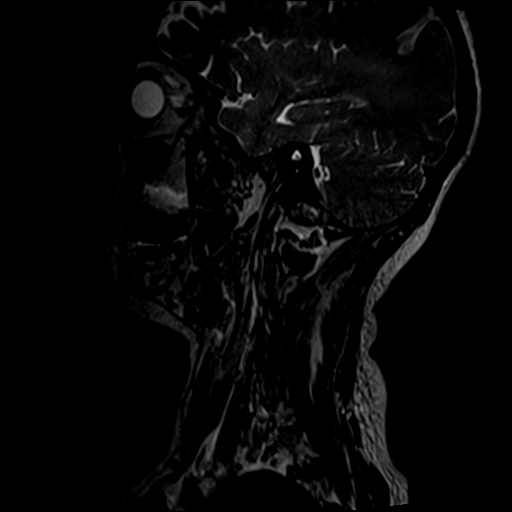
[im 15/60]
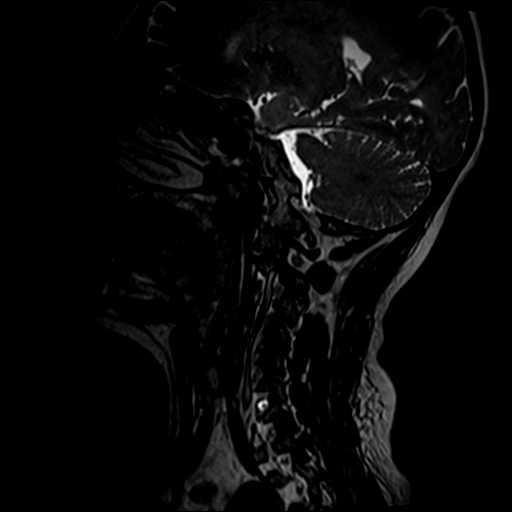
[im 30/60]
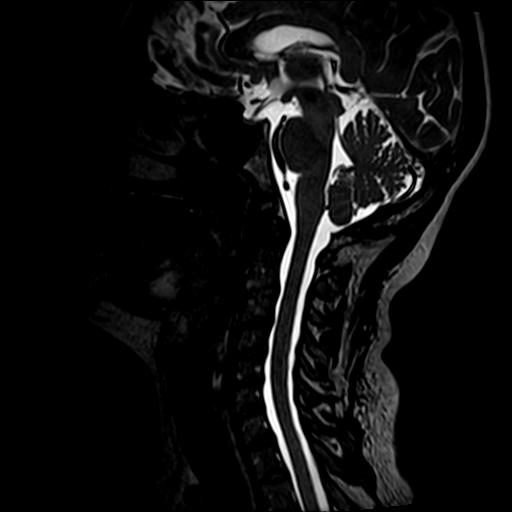
[im 45/60]
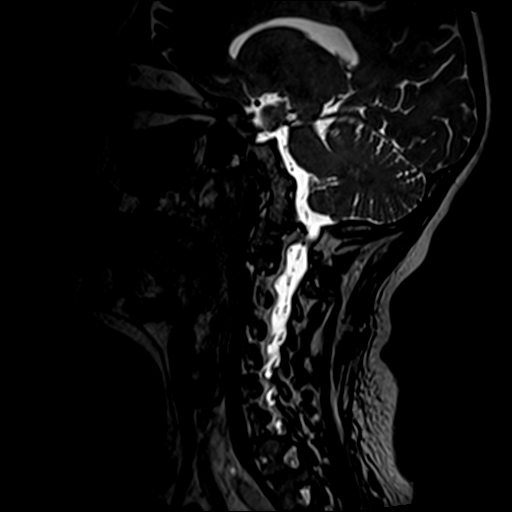
[im 60/60]
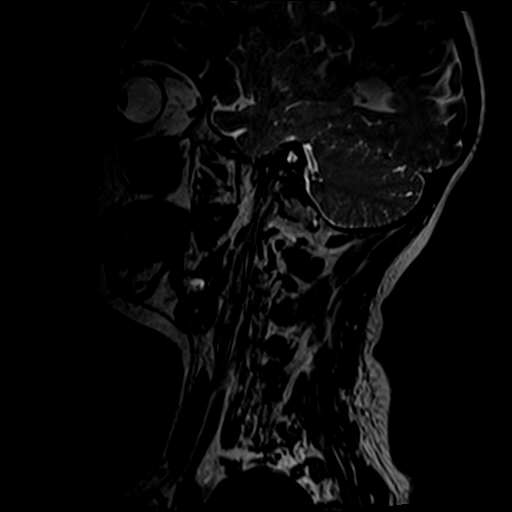

[Series 6: T1 · sagittal · 3.0mm · 0.86mm/px · 1 of 13 slices shown]
[im 1/13]
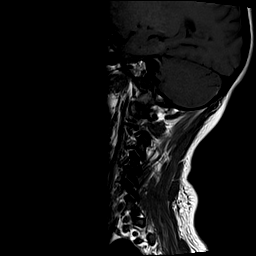

[Series 7: STIR · sagittal · 3.0mm · 0.69mm/px · 1 of 13 slices shown]
[im 1/13]
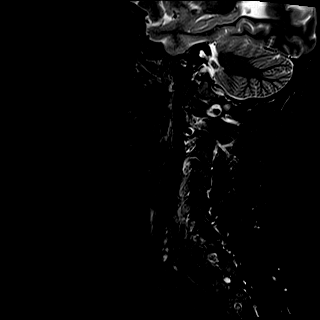

[Series 8: T2 · axial · 3.0mm · 0.62mm/px · z∈[-117,+14]mm · 3 of 36 slices shown (2 of 2)]
[im 1/36]
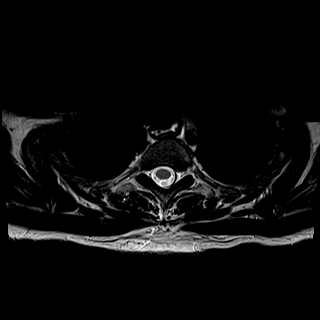
[im 18/36]
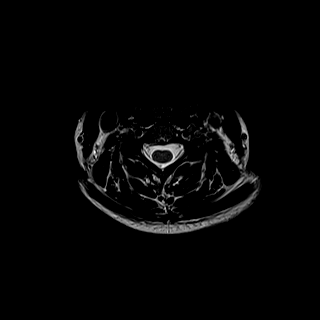
[im 36/36]
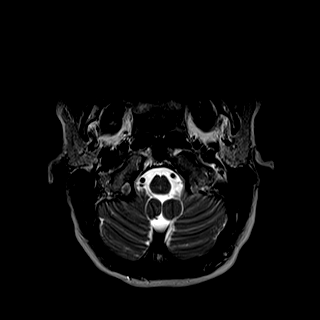

[Series 9: mpgr ax · axial · 3.0mm · 0.35mm/px · z∈[-107,+23]mm · 3 of 36 slices shown]
[im 1/36]
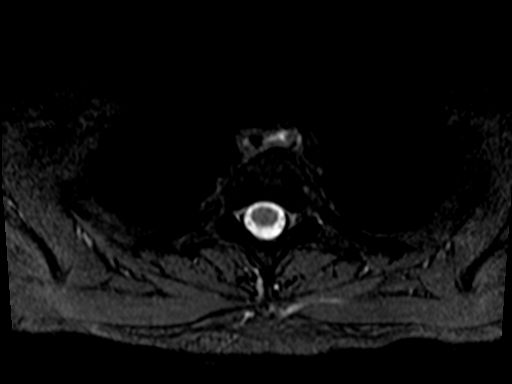
[im 18/36]
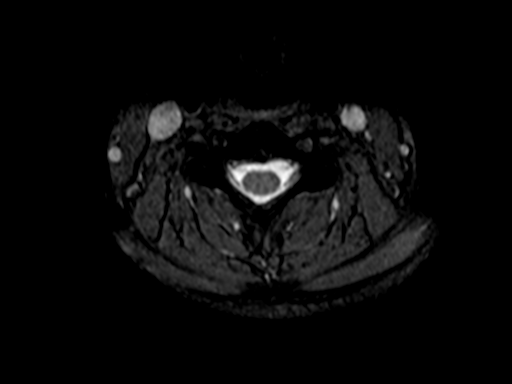
[im 36/36]
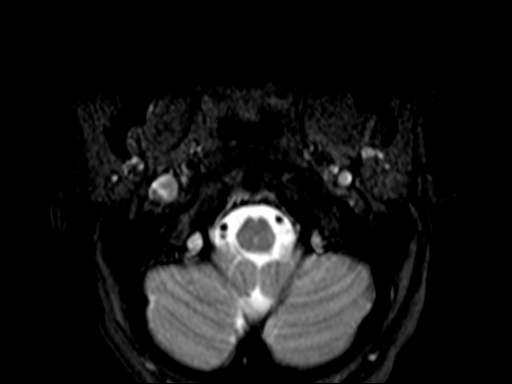

[Series 100: cor mpr · sagittal · 0.9mm · 0.50mm/px · 8 of 96 slices shown]
[im 1/96]
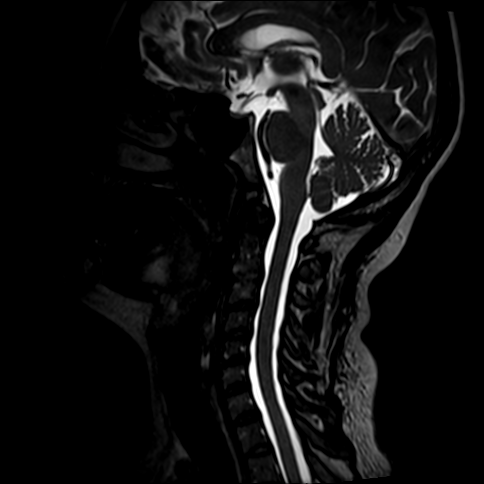
[im 12/96]
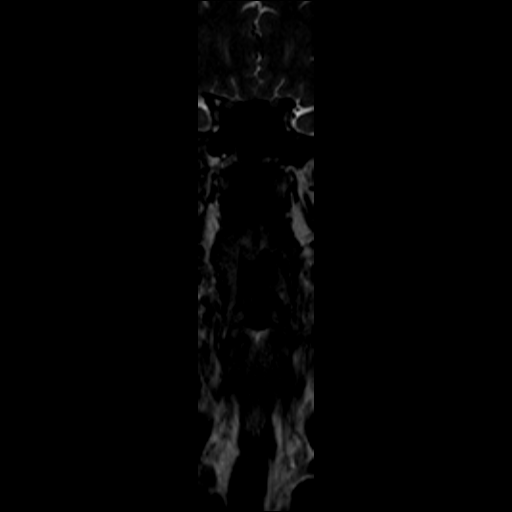
[im 24/96]
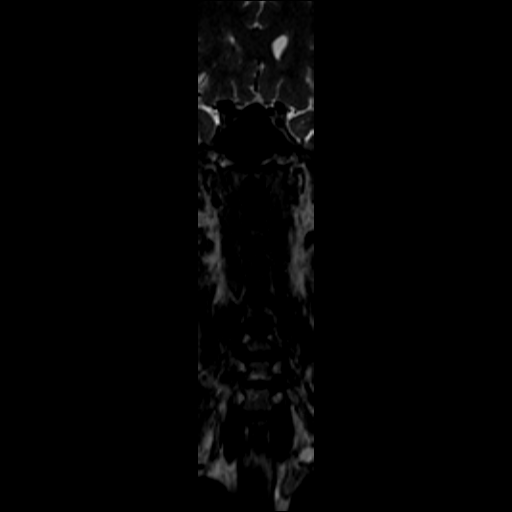
[im 36/96]
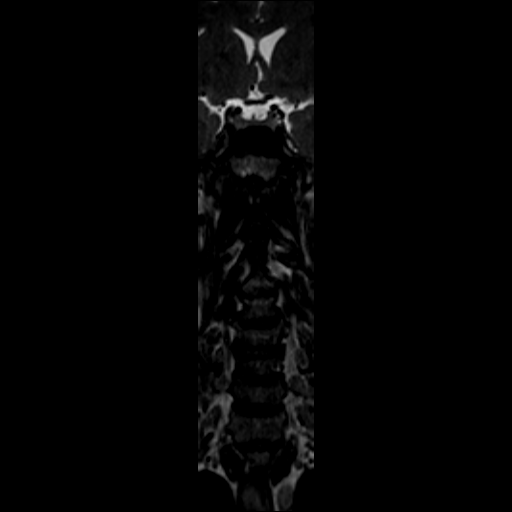
[im 60/96]
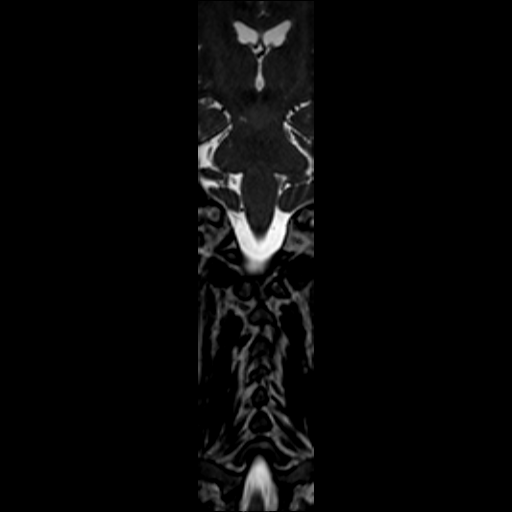
[im 72/96]
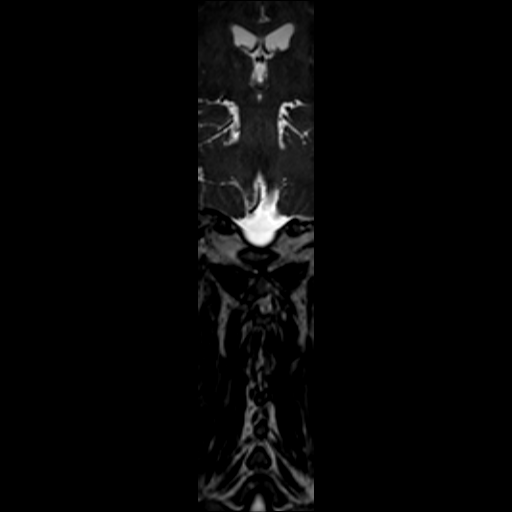
[im 84/96]
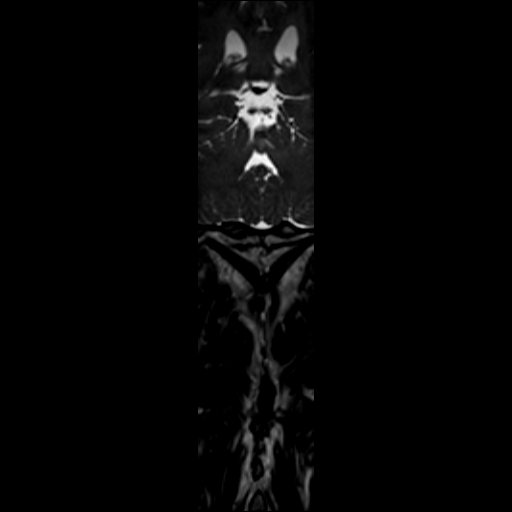
[im 96/96]
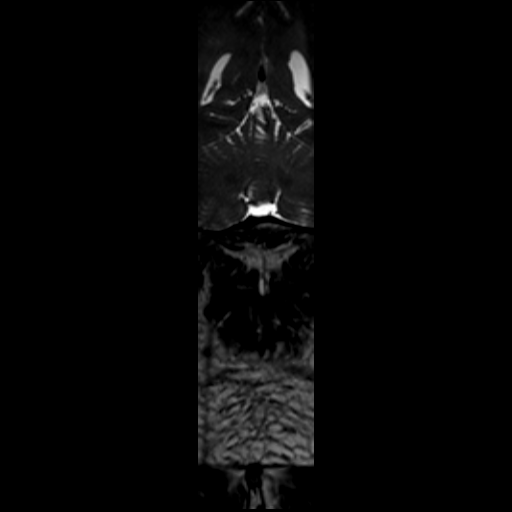

[23 of 48 positions shown; findings below may reference images not displayed]

FINDINGS: Alignment: Mild straightening of the normal cervical lordosis. No
listhesis.

Vertebrae: Vertebral body height maintained without acute or chronic
fracture. Bone marrow signal intensity within normal limits. No
discrete or worrisome osseous lesions. Mild discogenic reactive
endplate change present about the C2-3 and C5-6 interspaces. No
other abnormal marrow edema.

Cord: Normal signal morphology.

Posterior Fossa, vertebral arteries, paraspinal tissues: Visualized
brain and posterior fossa within normal limits. Craniocervical
junction normal. Paraspinous and prevertebral soft tissues within
normal limits. Normal intravascular flow voids seen within the
vertebral arteries bilaterally.

Disc levels:

C2-C3: Mild disc bulge with reactive endplate spurring. No
significant spinal stenosis. Foramina remain patent.

C3-C4: Minimal disc bulge with right greater than left uncovertebral
spurring. Mild facet hypertrophy, greater on the right. No spinal
stenosis. Foramina remain patent.

C4-C5: Lobulated central to right paracentral disc osteophyte
complex indents the ventral thecal sac (series 8, image 18). No
significant spinal stenosis or cord deformity. Superimposed
right-sided uncovertebral hypertrophy with resultant mild right C5
foraminal stenosis. Left neural foramina remains patent.

C5-C6: Minimal disc bulge with uncovertebral hypertrophy. No
significant spinal stenosis. Foramina remain patent.

C6-C7:  Unremarkable.

C7-T1:  Unremarkable.

Visualized upper thoracic spine demonstrates no significant finding.
IMPRESSION: 1. Central to right paracentral disc osteophyte complex at C4-5 with
resultant mild right C5 foraminal stenosis.
2. Additional mild noncompressive disc bulging at C2-3, C3-4, and
C5-6 without significant stenosis or neural impingement.

## 2020-05-22 ENCOUNTER — Other Ambulatory Visit: Payer: Self-pay

## 2020-05-22 ENCOUNTER — Ambulatory Visit (INDEPENDENT_AMBULATORY_CARE_PROVIDER_SITE_OTHER): Payer: No Typology Code available for payment source | Admitting: Obstetrics and Gynecology

## 2020-05-22 ENCOUNTER — Other Ambulatory Visit (HOSPITAL_COMMUNITY)
Admission: RE | Admit: 2020-05-22 | Discharge: 2020-05-22 | Disposition: A | Discharge status: Home / Self Care | Payer: No Typology Code available for payment source | Source: Ambulatory Visit | Attending: Obstetrics and Gynecology | Admitting: Obstetrics and Gynecology

## 2020-05-22 ENCOUNTER — Encounter: Payer: Self-pay | Admitting: Obstetrics and Gynecology

## 2020-05-22 VITALS — BP 133/88 | HR 66 | Ht 63.0 in | Wt 137.0 lb

## 2020-05-22 DIAGNOSIS — Z01419 Encounter for gynecological examination (general) (routine) without abnormal findings: Secondary | ICD-10-CM

## 2020-05-22 MED FILL — PANTOPRAZOLE SOD DR 20 MG T: 20 | 90 days supply | Qty: 90 | Fill #2

## 2020-05-22 NOTE — Progress Notes (Signed)
Subjective:     April Webster is a 45 y.o. female P3 with Mirena IUD induced AUB and BMI 24 who is here for a comprehensive physical exam. The patient reports no problems. Patient with a history of AUB now better controlled with Mirena. She describes vaginal bleeding every 3 weeks ranging from spotting to full flow. This is an improvement in her menorrhagia with passage of clots previously. Patient is sexually active using BTL and Mirena for contraception without complaints. She denies pelvic pain or abnormal discharge. Patient was diagnosed with lichen sclerosis a few years ago and rarely has any flares  No past medical history on file. Past Surgical History:  Procedure Laterality Date  . ABDOMINOPLASTY    . AUGMENTATION MAMMAPLASTY Bilateral 54/6270   Silicone   . PLACEMENT OF BREAST IMPLANTS     No family history on file.  Social History   Socioeconomic History  . Marital status: Married    Spouse name: Not on file  . Number of children: Not on file  . Years of education: Not on file  . Highest education level: Not on file  Occupational History  . Not on file  Tobacco Use  . Smoking status: Former Smoker    Packs/day: 2.00    Years: 5.00    Pack years: 10.00    Types: Cigarettes    Quit date: 03/15/1996    Years since quitting: 24.2  . Smokeless tobacco: Never Used  Substance and Sexual Activity  . Alcohol use: Yes    Alcohol/week: 2.0 standard drinks    Types: 2 Glasses of wine per week  . Drug use: Never  . Sexual activity: Yes    Partners: Male  Other Topics Concern  . Not on file  Social History Narrative  . Not on file   Social Determinants of Health   Financial Resource Strain: Not on file  Food Insecurity: Not on file  Transportation Needs: Not on file  Physical Activity: Not on file  Stress: Not on file  Social Connections: Not on file  Intimate Partner Violence: Not on file   Health Maintenance  Topic Date Due  . HIV Screening  Never done  .  COVID-19 Vaccine (3 - Booster for Pfizer series) 05/04/2020  . PAP SMEAR-Modifier  12/06/2021  . TETANUS/TDAP  04/29/2027  . INFLUENZA VACCINE  Completed  . Hepatitis C Screening  Completed  . HPV VACCINES  Aged Out       Review of Systems Pertinent items noted in HPI and remainder of comprehensive ROS otherwise negative.   Objective:  Blood pressure 133/88, pulse 66, height 5' 3"  (1.6 m), weight 137 lb (62.1 kg).     GENERAL: Well-developed, well-nourished female in no acute distress.  HEENT: Normocephalic, atraumatic. Sclerae anicteric.  NECK: Supple. Normal thyroid.  LUNGS: Clear to auscultation bilaterally.  HEART: Regular rate and rhythm. BREASTS: Symmetric in size. No palpable masses or lymphadenopathy, skin changes, or nipple drainage. ABDOMEN: Soft, nontender, nondistended. No organomegaly. PELVIC: Normal external female genitalia with pale area at 4 0'clcok near introitus. Vagina is pink and rugated.  Normal discharge. Normal appearing cervix with IUD strings visualized at the os. Uterus is normal in size. No adnexal mass or tenderness. EXTREMITIES: No cyanosis, clubbing, or edema, 2+ distal pulses.    Assessment:    Healthy female exam.      Plan:    Pap smear collected Patient with normal mammogram 04/2020 Mirena IUD due for removal 11/2021 Refill on clobetasol cream  not needed Patient is considering endometrial ablation vs hysterectomy Patient understands that endometrial biopsy and ultrasound will be needed prior  Patient will be contacted with abnormal results See After Visit Summary for Counseling Recommendations

## 2020-05-27 ENCOUNTER — Encounter: Payer: Self-pay | Admitting: Family Medicine

## 2020-05-27 LAB — CYTOLOGY - PAP
Adequacy: ABSENT
Comment: NEGATIVE
High risk HPV: NEGATIVE

## 2020-05-27 NOTE — Progress Notes (Unsigned)
Negative for intraepithelial lesion or malignancy.

## 2020-06-14 ENCOUNTER — Other Ambulatory Visit (HOSPITAL_BASED_OUTPATIENT_CLINIC_OR_DEPARTMENT_OTHER): Payer: Self-pay

## 2020-06-20 ENCOUNTER — Other Ambulatory Visit (HOSPITAL_BASED_OUTPATIENT_CLINIC_OR_DEPARTMENT_OTHER): Payer: Self-pay

## 2020-06-20 ENCOUNTER — Telehealth: Payer: Self-pay | Admitting: Family Medicine

## 2020-06-20 DIAGNOSIS — G8929 Other chronic pain: Secondary | ICD-10-CM

## 2020-06-20 MED ORDER — AMITRIPTYLINE HCL 25 MG PO TABS
25.0000 mg | ORAL_TABLET | Freq: Every day | ORAL | 1 refills | Status: DC
  Filled 2020-06-20: qty 60, 30d supply, fill #0
  Filled 2020-08-01: qty 60, 30d supply, fill #1

## 2020-06-20 NOTE — Telephone Encounter (Signed)
Option for amitriptyline sent to pharmacy.  Prescription for chronic neck pain.

## 2020-06-26 ENCOUNTER — Ambulatory Visit (INDEPENDENT_AMBULATORY_CARE_PROVIDER_SITE_OTHER): Payer: No Typology Code available for payment source | Admitting: Obstetrics and Gynecology

## 2020-06-26 ENCOUNTER — Other Ambulatory Visit: Payer: Self-pay

## 2020-06-26 ENCOUNTER — Other Ambulatory Visit (HOSPITAL_COMMUNITY)
Admission: RE | Admit: 2020-06-26 | Discharge: 2020-06-26 | Disposition: A | Discharge status: Home / Self Care | Payer: No Typology Code available for payment source | Source: Ambulatory Visit | Attending: Obstetrics and Gynecology | Admitting: Obstetrics and Gynecology

## 2020-06-26 ENCOUNTER — Encounter: Payer: Self-pay | Admitting: Obstetrics and Gynecology

## 2020-06-26 VITALS — BP 128/81 | HR 82 | Resp 16 | Ht 63.0 in | Wt 136.0 lb

## 2020-06-26 DIAGNOSIS — R87612 Low grade squamous intraepithelial lesion on cytologic smear of cervix (LGSIL): Secondary | ICD-10-CM | POA: Diagnosis not present

## 2020-06-26 DIAGNOSIS — Z01812 Encounter for preprocedural laboratory examination: Secondary | ICD-10-CM | POA: Diagnosis not present

## 2020-06-26 LAB — POCT URINE PREGNANCY: Preg Test, Ur: NEGATIVE

## 2020-06-26 NOTE — Progress Notes (Signed)
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  Placed in lithotomy position. Cervix viewed with speculum and colposcope after application of acetic acid. IUD strings visualized extending from os 2 cm  Colposcopy adequate?  yes Acetowhite lesions? Yes 2 o'clock Punctation? no Mosaicism?  no Abnormal vasculature?  no Biopsies? 2 o'clock ECC? yes  COMMENTS:  Patient was given post procedure instructions.  She will return in 2 weeks for results.  Mora Bellman, MD

## 2020-06-30 LAB — SURGICAL PATHOLOGY

## 2020-07-01 ENCOUNTER — Encounter: Payer: Self-pay | Admitting: Obstetrics & Gynecology

## 2020-07-01 DIAGNOSIS — R87612 Low grade squamous intraepithelial lesion on cytologic smear of cervix (LGSIL): Secondary | ICD-10-CM | POA: Insufficient documentation

## 2020-07-04 ENCOUNTER — Telehealth: Payer: Self-pay | Admitting: Sports Medicine

## 2020-07-04 ENCOUNTER — Other Ambulatory Visit (HOSPITAL_BASED_OUTPATIENT_CLINIC_OR_DEPARTMENT_OTHER): Payer: Self-pay

## 2020-07-04 DIAGNOSIS — J301 Allergic rhinitis due to pollen: Secondary | ICD-10-CM

## 2020-07-04 DIAGNOSIS — J309 Allergic rhinitis, unspecified: Secondary | ICD-10-CM | POA: Insufficient documentation

## 2020-07-04 MED ORDER — AZELASTINE HCL 0.1 % NA SOLN
2.0000 | Freq: Two times a day (BID) | NASAL | 1 refills | Status: DC
Start: 2020-07-04 — End: 2020-10-31
  Filled 2020-07-04: qty 90, 75d supply, fill #0

## 2020-07-04 MED ORDER — LEVOCETIRIZINE DIHYDROCHLORIDE 5 MG PO TABS
5.0000 mg | ORAL_TABLET | Freq: Every evening | ORAL | 3 refills | Status: DC
  Filled 2020-07-04: qty 90, 90d supply, fill #0

## 2020-07-04 MED ORDER — PREDNISONE 50 MG PO TABS
ORAL_TABLET | ORAL | 0 refills | Status: DC
  Filled 2020-07-04: qty 5, 5d supply, fill #0

## 2020-07-04 MED FILL — Phentermine HCl Cap 15 MG: ORAL | 30 days supply | Qty: 30 | Fill #0 | Status: AC

## 2020-07-04 NOTE — Telephone Encounter (Signed)
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 Adding 5 days of prednisone, azelastine nasal, Xyzal, continue Singulair.

## 2020-07-04 NOTE — Assessment & Plan Note (Signed)
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 Adding 5 days of prednisone, azelastine nasal, Xyzal, continue Singulair.

## 2020-07-09 ENCOUNTER — Other Ambulatory Visit: Payer: Self-pay

## 2020-07-09 ENCOUNTER — Encounter: Payer: Self-pay | Admitting: Family Medicine

## 2020-07-09 ENCOUNTER — Ambulatory Visit (INDEPENDENT_AMBULATORY_CARE_PROVIDER_SITE_OTHER): Payer: No Typology Code available for payment source | Admitting: Family Medicine

## 2020-07-09 VITALS — BP 126/88

## 2020-07-09 DIAGNOSIS — R9431 Abnormal electrocardiogram [ECG] [EKG]: Secondary | ICD-10-CM

## 2020-07-09 DIAGNOSIS — R000 Tachycardia, unspecified: Secondary | ICD-10-CM

## 2020-07-09 DIAGNOSIS — R002 Palpitations: Secondary | ICD-10-CM | POA: Diagnosis not present

## 2020-07-09 NOTE — Progress Notes (Signed)
Acute Office Visit  Subjective:    Patient ID: April Webster, female    DOB: Sep 27, 1975, 45 y.o.   MRN: [id number]  Chief Complaint  Patient presents with  . Palpitations    HPI Patient is in today for palpitations.  They started early this AM.  She denies any recent changes in fact she had not even had caffeine yet this morning.  She says this morning her heart rate just standing at rest got up to 120.  She denies any recent changes etc.  She does have Graves' disease and had problems with tachycardia when she was first diagnosed.  She is currently on Synthroid 112 mcg daily.  Last TSH 1 December was a little borderline low at 0.6.  She was previously on phentermine and did take a tab this AM because she has an eye on prednisone for the last 5 days.   Past Medical History:  Diagnosis Date  . Chronic idiopathic constipation   . DDD (degenerative disc disease), cervical and lumbar   . Female pattern hair loss   . Graves disease   . Hyperlipidemia   . Hypertension   . Lichen sclerosus   . Raynaud's disease     Past Surgical History:  Procedure Laterality Date  . ABDOMINOPLASTY    . AUGMENTATION MAMMAPLASTY Bilateral 92/0100   Silicone   . PLACEMENT OF BREAST IMPLANTS    . THYROID SURGERY      Family History  Problem Relation Age of Onset  . Heart attack Maternal Grandmother   . Stomach cancer Maternal Grandfather   . Lung cancer Father   . Hypertension Mother   . Hyperlipidemia Mother   . Osteoporosis Mother     Social History   Socioeconomic History  . Marital status: Married    Spouse name: Not on file  . Number of children: Not on file  . Years of education: Not on file  . Highest education level: Not on file  Occupational History  . Not on file  Tobacco Use  . Smoking status: Former Smoker    Packs/day: 2.00    Years: 5.00    Pack years: 10.00    Types: Cigarettes    Quit date: 03/15/1996    Years since quitting: 24.3  . Smokeless tobacco: Never Used   Substance and Sexual Activity  . Alcohol use: Yes    Alcohol/week: 2.0 standard drinks    Types: 2 Glasses of wine per week  . Drug use: Never  . Sexual activity: Yes    Partners: Male    Birth control/protection: I.U.D.  Other Topics Concern  . Not on file  Social History Narrative  . Not on file   Social Determinants of Health   Financial Resource Strain: Not on file  Food Insecurity: Not on file  Transportation Needs: Not on file  Physical Activity: Not on file  Stress: Not on file  Social Connections: Not on file  Intimate Partner Violence: Not on file    Outpatient Medications Prior to Visit  Medication Sig Dispense Refill  . amitriptyline (ELAVIL) 25 MG tablet Take 1-2 tablets (25-50 mg total) by mouth at bedtime. 60 tablet 1  . amLODipine (NORVASC) 2.5 MG tablet TAKE 1 TABLET (2.5 MG TOTAL) BY MOUTH DAILY. 90 tablet 1  . atorvastatin (LIPITOR) 20 MG tablet TAKE 1 TABLET (20 MG TOTAL) BY MOUTH IN THE MORNING. 90 tablet 3  . azelastine (ASTELIN) 0.1 % nasal spray Place 2 sprays into both nostrils  2 (two) times daily. Use in each nostril as directed 301 mL 1  . baclofen (LIORESAL) 10 MG tablet TAKE 1 TABLET (10 MG TOTAL) BY MOUTH AT BEDTIME. 90 tablet 3  . clobetasol ointment (TEMOVATE) 6.04 % Apply 1 application topically daily as needed for rash. LIchen sclerosis    . diphenhydrAMINE (BENADRYL) 25 MG tablet Take 25 mg by mouth at bedtime as needed for sleep.    . finasteride (PROSCAR) 5 MG tablet TAKE 1/2 TABLET (2.5 MG TOTAL) BY MOUTH DAILY. 45 tablet 3  . gabapentin (NEURONTIN) 300 MG capsule TAKE 1 CAPSULE BY MOUTH EVERY NIGHT AT BEDTIME 90 capsule 3  . levocetirizine (XYZAL) 5 MG tablet Take 1 tablet (5 mg total) by mouth every evening. 90 tablet 3  . levonorgestrel (MIRENA) 20 MCG/24HR IUD by Intrauterine route.    . linaclotide (LINZESS) 145 MCG CAPS capsule TAKE 1 CAPSULE (145 MCG TOTAL) BY MOUTH DAILY BEFORE BREAKFAST. (Patient taking differently: Take 145 mcg by  mouth as needed.) 90 capsule 0  . linaclotide (LINZESS) 72 MCG capsule TAKE 1 CAPSULE (72 MCG TOTAL) BY MOUTH DAILY BEFORE BREAKFAST. 90 capsule 0  . meloxicam (MOBIC) 15 MG tablet TAKE 1 TABLET (15 MG TOTAL) BY MOUTH DAILY. 90 tablet 1  . montelukast (SINGULAIR) 10 MG tablet TAKE 1 TABLET (10 MG TOTAL) BY MOUTH AT BEDTIME. 90 tablet 3  . pantoprazole (PROTONIX) 20 MG tablet TAKE 1 TABLET (20 MG TOTAL) BY MOUTH DAILY. 90 tablet 3  . phentermine 15 MG capsule TAKE 1 CAPSULE (15 MG TOTAL) BY MOUTH EVERY MORNING. 30 capsule 1  . predniSONE (DELTASONE) 50 MG tablet Take 1 tablet by mouth daily for 5 days. 5 tablet 0  . SYNTHROID 112 MCG tablet TAKE 1 TABLET (112 MCG TOTAL) BY MOUTH DAILY. 90 tablet 1  . triazolam (HALCION) 0.25 MG tablet TAKE 1-2 TABLETS BY MOUTH 2 HOURS BEFORE PROCEDURE OR IMAGING. DO NOT DRIVE WITH THIS MEDICATION. 2 tablet 0   No facility-administered medications prior to visit.    Allergies  Allergen Reactions  . Cefuroxime Axetil Rash  . Tuberculin Purified Protein Derivative Rash    Erythema and itching without induration  . Ambien [Zolpidem] Other (See Comments)    Sleep talking    Review of Systems     Objective:    Physical Exam  BP 126/88  Wt Readings from Last 3 Encounters:  06/26/20 136 lb (61.7 kg)  05/22/20 137 lb (62.1 kg)  05/01/20 132 lb 3.2 oz (60 kg)    Health Maintenance Due  Topic Date Due  . HIV Screening  Never done  . COVID-19 Vaccine (3 - Booster for Pfizer series) 05/04/2020    There are no preventive care reminders to display for this patient.   Lab Results  Component Value Date   TSH 2.30 08/20/2019   Lab Results  Component Value Date   WBC 4.4 03/10/2020   HGB 13.7 03/10/2020   HCT 40.4 03/10/2020   MCV 95.3 03/10/2020   PLT 273 03/10/2020   Lab Results  Component Value Date   NA 136 03/10/2020   K 4.0 03/10/2020   CO2 22 03/10/2020   GLUCOSE 91 03/10/2020   BUN 14 03/10/2020   CREATININE 0.63 03/10/2020    BILITOT 0.5 03/10/2020   AST 25 03/10/2020   ALT 28 03/10/2020   PROT 6.3 03/10/2020   CALCIUM 9.0 03/10/2020   Lab Results  Component Value Date   CHOL 140 03/10/2020   Lab Results  Component Value Date   HDL 58 03/10/2020   Lab Results  Component Value Date   LDLCALC 68 03/10/2020   Lab Results  Component Value Date   TRIG 59 03/10/2020   Lab Results  Component Value Date   CHOLHDL 2.4 03/10/2020   No results found for: HGBA1C     Assessment & Plan:   Problem List Items Addressed This Visit   None   Visit Diagnoses    Palpitations    -  Primary   Relevant Orders   EKG 12-Lead   TSH   Basic Metabolic Panel (BMET)   CBC with Differential   ECHOCARDIOGRAM COMPLETE   Tachycardia       Relevant Orders   TSH   Basic Metabolic Panel (BMET)   CBC with Differential   ECHOCARDIOGRAM COMPLETE   Abnormal EKG       Relevant Orders   ECHOCARDIOGRAM COMPLETE     EKG today shows rate of 88 bpm, normal sinus rhythm with no acute ST-T wave changes.  She does have some left ventricular hypertrophy.  I do not have an old EKG for comparison.  Will check labs just to make sure that her TSH has not slipped a little lower.  We will check for electrolyte abnormality.  Again no other major recent changes.  She did take her phentermine this morning but says that she has been on it for months and has not had any tachycardia or problems.  We will check for anemia.  The good news is there is no arrhythmia.  LVH/abnormal EKG-schedule for echocardiogram.  No orders of the defined types were placed in this encounter.    Beatrice Lecher, MD

## 2020-07-10 ENCOUNTER — Other Ambulatory Visit (HOSPITAL_BASED_OUTPATIENT_CLINIC_OR_DEPARTMENT_OTHER): Payer: Self-pay

## 2020-07-10 ENCOUNTER — Other Ambulatory Visit: Payer: Self-pay | Admitting: Family Medicine

## 2020-07-10 LAB — CBC WITH DIFFERENTIAL/PLATELET
Absolute Monocytes: 328 cells/uL (ref 200–950)
Basophils Absolute: 13 cells/uL (ref 0–200)
Basophils Relative: 0.2 %
Eosinophils Absolute: 0 cells/uL — ABNORMAL LOW (ref 15–500)
Eosinophils Relative: 0 %
HCT: 42.9 % (ref 35.0–45.0)
Hemoglobin: 14.4 g/dL (ref 11.7–15.5)
Lymphs Abs: 1065 cells/uL (ref 850–3900)
MCH: 32.3 pg (ref 27.0–33.0)
MCHC: 33.6 g/dL (ref 32.0–36.0)
MCV: 96.2 fL (ref 80.0–100.0)
MPV: 10.4 fL (ref 7.5–12.5)
Monocytes Relative: 5.2 %
Neutro Abs: 4895 cells/uL (ref 1500–7800)
Neutrophils Relative %: 77.7 %
Platelets: 305 10*3/uL (ref 140–400)
RBC: 4.46 10*6/uL (ref 3.80–5.10)
RDW: 12.4 % (ref 11.0–15.0)
Total Lymphocyte: 16.9 %
WBC: 6.3 10*3/uL (ref 3.8–10.8)

## 2020-07-10 LAB — BASIC METABOLIC PANEL
BUN: 17 mg/dL (ref 7–25)
CO2: 31 mmol/L (ref 20–32)
Calcium: 10.2 mg/dL (ref 8.6–10.2)
Chloride: 99 mmol/L (ref 98–110)
Creat: 0.75 mg/dL (ref 0.50–1.10)
Glucose, Bld: 150 mg/dL — ABNORMAL HIGH (ref 65–99)
Potassium: 3.8 mmol/L (ref 3.5–5.3)
Sodium: 139 mmol/L (ref 135–146)

## 2020-07-10 LAB — TSH: TSH: 0.34 mIU/L — ABNORMAL LOW

## 2020-07-10 MED ORDER — SYNTHROID 100 MCG PO TABS
100.0000 ug | ORAL_TABLET | Freq: Every day | ORAL | 0 refills | Status: DC
  Filled 2020-07-10: qty 90, 90d supply, fill #0

## 2020-07-24 ENCOUNTER — Other Ambulatory Visit: Payer: Self-pay

## 2020-07-24 ENCOUNTER — Ambulatory Visit (HOSPITAL_COMMUNITY)
Admission: RE | Admit: 2020-07-24 | Discharge: 2020-07-24 | Disposition: A | Discharge status: Home / Self Care | Payer: No Typology Code available for payment source | Source: Ambulatory Visit | Attending: Family Medicine | Admitting: Family Medicine

## 2020-07-24 DIAGNOSIS — E785 Hyperlipidemia, unspecified: Secondary | ICD-10-CM | POA: Diagnosis not present

## 2020-07-24 DIAGNOSIS — R000 Tachycardia, unspecified: Secondary | ICD-10-CM | POA: Insufficient documentation

## 2020-07-24 DIAGNOSIS — I10 Essential (primary) hypertension: Secondary | ICD-10-CM | POA: Insufficient documentation

## 2020-07-24 DIAGNOSIS — R9431 Abnormal electrocardiogram [ECG] [EKG]: Secondary | ICD-10-CM | POA: Insufficient documentation

## 2020-07-24 DIAGNOSIS — I351 Nonrheumatic aortic (valve) insufficiency: Secondary | ICD-10-CM | POA: Diagnosis not present

## 2020-07-24 DIAGNOSIS — R002 Palpitations: Secondary | ICD-10-CM | POA: Insufficient documentation

## 2020-07-24 LAB — ECHOCARDIOGRAM COMPLETE
Area-P 1/2: 4.49 cm2
Calc EF: 53.2 %
S' Lateral: 3 cm
Single Plane A2C EF: 52.1 %
Single Plane A4C EF: 51.1 %

## 2020-07-24 MED ORDER — PERFLUTREN LIPID MICROSPHERE
1.0000 mL | INTRAVENOUS | Status: AC | PRN
  Administered 2020-07-24: 2 mL via INTRAVENOUS
  Filled 2020-07-24: qty 10

## 2020-07-24 NOTE — Progress Notes (Signed)
  Echocardiogram 2D Echocardiogram has been performed.  Jennette Dubin 07/24/2020, 2:57 PM

## 2020-08-01 ENCOUNTER — Other Ambulatory Visit (HOSPITAL_BASED_OUTPATIENT_CLINIC_OR_DEPARTMENT_OTHER): Payer: Self-pay

## 2020-08-01 MED FILL — Atorvastatin Calcium Tab 20 MG (Base Equivalent): ORAL | 90 days supply | Qty: 90 | Fill #0 | Status: AC

## 2020-08-01 MED FILL — Finasteride Tab 5 MG: ORAL | 90 days supply | Qty: 45 | Fill #0 | Status: AC

## 2020-08-01 MED FILL — Pantoprazole Sodium EC Tab 20 MG (Base Equiv): ORAL | 90 days supply | Qty: 90 | Fill #0 | Status: CN

## 2020-08-01 MED FILL — Montelukast Sodium Tab 10 MG (Base Equiv): ORAL | 90 days supply | Qty: 90 | Fill #0 | Status: AC

## 2020-08-01 MED FILL — Amlodipine Besylate Tab 2.5 MG (Base Equivalent): ORAL | 90 days supply | Qty: 90 | Fill #0 | Status: AC

## 2020-08-01 MED FILL — Meloxicam Tab 15 MG: ORAL | 90 days supply | Qty: 90 | Fill #0 | Status: AC

## 2020-08-01 MED FILL — Baclofen Tab 10 MG: ORAL | 90 days supply | Qty: 90 | Fill #0 | Status: AC

## 2020-08-14 ENCOUNTER — Other Ambulatory Visit (HOSPITAL_BASED_OUTPATIENT_CLINIC_OR_DEPARTMENT_OTHER): Payer: Self-pay

## 2020-08-14 ENCOUNTER — Ambulatory Visit (INDEPENDENT_AMBULATORY_CARE_PROVIDER_SITE_OTHER): Payer: No Typology Code available for payment source | Admitting: Family Medicine

## 2020-08-14 ENCOUNTER — Other Ambulatory Visit: Payer: Self-pay

## 2020-08-14 ENCOUNTER — Encounter: Payer: Self-pay | Admitting: Family Medicine

## 2020-08-14 VITALS — BP 126/85 | HR 104 | Temp 98.8°F | Resp 17 | Ht 63.0 in | Wt 134.0 lb

## 2020-08-14 DIAGNOSIS — M5030 Other cervical disc degeneration, unspecified cervical region: Secondary | ICD-10-CM | POA: Diagnosis not present

## 2020-08-14 DIAGNOSIS — M542 Cervicalgia: Secondary | ICD-10-CM

## 2020-08-14 MED ORDER — PREGABALIN 25 MG PO CAPS
ORAL_CAPSULE | ORAL | 0 refills | Status: DC
  Filled 2020-08-14: qty 105, 30d supply, fill #0

## 2020-08-14 NOTE — Progress Notes (Signed)
Established Patient Office Visit  Subjective:  Patient ID: April Webster, female    DOB: 06/02/1975  Age: 45 y.o. MRN: [id number]  CC:  Chief Complaint  Patient presents with  . Neck Pain    HPI April PATERNOSTRO presents for follow-up persistent and chronic neck pain.  She does have known degenerative disc of the cervical spine.  Unfortunately she continues to have persistent pain.  He is currently on amitriptyline at night and it does seem to help some but not significantly and it has caused some mood shift.  She also has baclofen to take.  Also currently seeing a chiropractor regularly which is helping some. Prednisone really helps but can't take it chronically.    She did physical therapy in November, December and January.  She is not interested in dry needling at this point.  Tried gabapentin- sedating.  Flexeril-sedating Ibuprofen, Voltaren, naproxen-not effective. Currently on Mobic but has been on it for years for other issues. Medrol-not helpful.  Was not more effective than Tylenol. Cymbalta caused urinary retention.  We will add to intolerance list. Massage does help for a few hours.  Chiropractic care helps temporarily.  Has tried new pillows etc.  She has been exercising regularly lately.   EXAM: MRI CERVICAL SPINE WITHOUT CONTRAST  TECHNIQUE: Multiplanar, multisequence MR imaging of the cervical spine was performed. No intravenous contrast was administered.  COMPARISON:  Prior radiograph from 05/16/2020.  FINDINGS: Alignment: Mild straightening of the normal cervical lordosis. No listhesis.  Vertebrae: Vertebral body height maintained without acute or chronic fracture. Bone marrow signal intensity within normal limits. No discrete or worrisome osseous lesions. Mild discogenic reactive endplate change present about the C2-3 and C5-6 interspaces. No other abnormal marrow edema.  Cord: Normal signal morphology.  Posterior Fossa, vertebral arteries,  paraspinal tissues: Visualized brain and posterior fossa within normal limits. Craniocervical junction normal. Paraspinous and prevertebral soft tissues within normal limits. Normal intravascular flow voids seen within the vertebral arteries bilaterally.  Disc levels:  C2-C3: Mild disc bulge with reactive endplate spurring. No significant spinal stenosis. Foramina remain patent.  C3-C4: Minimal disc bulge with right greater than left uncovertebral spurring. Mild facet hypertrophy, greater on the right. No spinal stenosis. Foramina remain patent.  C4-C5: Lobulated central to right paracentral disc osteophyte complex indents the ventral thecal sac (series 8, image 18). No significant spinal stenosis or cord deformity. Superimposed right-sided uncovertebral hypertrophy with resultant mild right C5 foraminal stenosis. Left neural foramina remains patent.  C5-C6: Minimal disc bulge with uncovertebral hypertrophy. No significant spinal stenosis. Foramina remain patent.  C6-C7:  Unremarkable.  C7-T1:  Unremarkable.  Visualized upper thoracic spine demonstrates no significant finding.  IMPRESSION: 1. Central to right paracentral disc osteophyte complex at C4-5 with resultant mild right C5 foraminal stenosis. 2. Additional mild noncompressive disc bulging at C2-3, C3-4, and C5-6 without significant stenosis or neural impingement.   Electronically Signed   By: April Webster M.D.   On: 05/18/2020 23:59     Past Medical History:  Diagnosis Date  . Chronic idiopathic constipation   . DDD (degenerative disc disease), cervical and lumbar   . Female pattern hair loss   . Graves disease   . Hyperlipidemia   . Hypertension   . Lichen sclerosus   . Raynaud's disease     Past Surgical History:  Procedure Laterality Date  . ABDOMINOPLASTY    . AUGMENTATION MAMMAPLASTY Bilateral 26/8341   Silicone   . PLACEMENT OF BREAST IMPLANTS    .  THYROID SURGERY       Family History  Problem Relation Age of Onset  . Heart attack Maternal Grandmother   . Stomach cancer Maternal Grandfather   . Lung cancer Father   . Hypertension Mother   . Hyperlipidemia Mother   . Osteoporosis Mother     Social History   Socioeconomic History  . Marital status: Married    Spouse name: Not on file  . Number of children: Not on file  . Years of education: Not on file  . Highest education level: Not on file  Occupational History  . Not on file  Tobacco Use  . Smoking status: Former Smoker    Packs/day: 2.00    Years: 5.00    Pack years: 10.00    Types: Cigarettes    Quit date: 03/15/1996    Years since quitting: 24.4  . Smokeless tobacco: Never Used  Substance and Sexual Activity  . Alcohol use: Yes    Alcohol/week: 2.0 standard drinks    Types: 2 Glasses of wine per week  . Drug use: Never  . Sexual activity: Yes    Partners: Male    Birth control/protection: I.U.D.  Other Topics Concern  . Not on file  Social History Narrative  . Not on file   Social Determinants of Health   Financial Resource Strain: Not on file  Food Insecurity: Not on file  Transportation Needs: Not on file  Physical Activity: Not on file  Stress: Not on file  Social Connections: Not on file  Intimate Partner Violence: Not on file    Outpatient Medications Prior to Visit  Medication Sig Dispense Refill  . amitriptyline (ELAVIL) 25 MG tablet Take 1-2 tablets (25-50 mg total) by mouth at bedtime. 60 tablet 1  . amLODipine (NORVASC) 2.5 MG tablet TAKE 1 TABLET (2.5 MG TOTAL) BY MOUTH DAILY. 90 tablet 1  . atorvastatin (LIPITOR) 20 MG tablet TAKE 1 TABLET (20 MG TOTAL) BY MOUTH IN THE MORNING. 90 tablet 3  . azelastine (ASTELIN) 0.1 % nasal spray Place 2 sprays into both nostrils 2 (two) times daily. Use in each nostril as directed (Patient taking differently: Place 2 sprays into both nostrils 2 (two) times daily as needed. Use in each nostril as directed) 301 mL 1  .  baclofen (LIORESAL) 10 MG tablet TAKE 1 TABLET (10 MG TOTAL) BY MOUTH AT BEDTIME. 90 tablet 3  . clobetasol ointment (TEMOVATE) 3.15 % Apply 1 application topically daily as needed for rash. LIchen sclerosis    . diphenhydrAMINE (BENADRYL) 25 MG tablet Take 25 mg by mouth at bedtime as needed for sleep.    . finasteride (PROSCAR) 5 MG tablet TAKE 1/2 TABLET (2.5 MG TOTAL) BY MOUTH DAILY. 45 tablet 3  . levocetirizine (XYZAL) 5 MG tablet Take 1 tablet (5 mg total) by mouth every evening. 90 tablet 3  . levonorgestrel (MIRENA) 20 MCG/24HR IUD by Intrauterine route.    . linaclotide (LINZESS) 72 MCG capsule TAKE 1 CAPSULE (72 MCG TOTAL) BY MOUTH DAILY BEFORE BREAKFAST. 90 capsule 0  . meloxicam (MOBIC) 15 MG tablet TAKE 1 TABLET (15 MG TOTAL) BY MOUTH DAILY. 90 tablet 1  . montelukast (SINGULAIR) 10 MG tablet TAKE 1 TABLET (10 MG TOTAL) BY MOUTH AT BEDTIME. 90 tablet 3  . pantoprazole (PROTONIX) 20 MG tablet TAKE 1 TABLET (20 MG TOTAL) BY MOUTH DAILY. 90 tablet 3  . SYNTHROID 100 MCG tablet Take 1 tablet (100 mcg total) by mouth daily before breakfast. 90  tablet 0  . phentermine 15 MG capsule TAKE 1 CAPSULE (15 MG TOTAL) BY MOUTH EVERY MORNING. (Patient taking differently: 15 mg. PRN) 30 capsule 1  . hydrochlorothiazide (HYDRODIURIL) 25 MG tablet Take 25 mg by mouth daily.    Marland Kitchen linaclotide (LINZESS) 145 MCG CAPS capsule TAKE 1 CAPSULE (145 MCG TOTAL) BY MOUTH DAILY BEFORE BREAKFAST. (Patient taking differently: Take 145 mcg by mouth as needed.) 90 capsule 0   No facility-administered medications prior to visit.    Allergies  Allergen Reactions  . Cefuroxime Axetil Rash  . Tuberculin Purified Protein Derivative Rash    Erythema and itching without induration  . Ambien [Zolpidem] Other (See Comments)    Sleep talking  . Cymbalta [Duloxetine Hcl] Other (See Comments)    Urinary retention.     ROS Review of Systems    Objective:    Physical Exam Vitals reviewed.  Constitutional:       Appearance: She is well-developed.  HENT:     Head: Normocephalic and atraumatic.  Eyes:     Conjunctiva/sclera: Conjunctivae normal.  Neck:     Comments: Limited flexion to the left. Cardiovascular:     Rate and Rhythm: Normal rate.  Pulmonary:     Effort: Pulmonary effort is normal.  Skin:    General: Skin is dry.     Coloration: Skin is not pale.  Neurological:     Mental Status: She is alert and oriented to person, place, and time.  Psychiatric:        Behavior: Behavior normal.     BP 126/85   Pulse (!) 104   Temp 98.8 F (37.1 C)   Resp 17   Ht 5' 3"  (1.6 m)   Wt 134 lb (60.8 kg)   SpO2 100%   BMI 23.74 kg/m  Wt Readings from Last 3 Encounters:  08/14/20 134 lb (60.8 kg)  06/26/20 136 lb (61.7 kg)  05/22/20 137 lb (62.1 kg)     Health Maintenance Due  Topic Date Due  . HIV Screening  Never done  . COVID-19 Vaccine (3 - Booster for Pfizer series) 04/03/2020    There are no preventive care reminders to display for this patient.  Lab Results  Component Value Date   TSH 0.34 (L) 07/09/2020   Lab Results  Component Value Date   WBC 6.3 07/09/2020   HGB 14.4 07/09/2020   HCT 42.9 07/09/2020   MCV 96.2 07/09/2020   PLT 305 07/09/2020   Lab Results  Component Value Date   NA 139 07/09/2020   K 3.8 07/09/2020   CO2 31 07/09/2020   GLUCOSE 150 (H) 07/09/2020   BUN 17 07/09/2020   CREATININE 0.75 07/09/2020   BILITOT 0.5 03/10/2020   AST 25 03/10/2020   ALT 28 03/10/2020   PROT 6.3 03/10/2020   CALCIUM 10.2 07/09/2020   Lab Results  Component Value Date   CHOL 140 03/10/2020   Lab Results  Component Value Date   HDL 58 03/10/2020   Lab Results  Component Value Date   LDLCALC 68 03/10/2020   Lab Results  Component Value Date   TRIG 59 03/10/2020   Lab Results  Component Value Date   CHOLHDL 2.4 03/10/2020   No results found for: HGBA1C    Assessment & Plan:   Problem List Items Addressed This Visit      Other   Cervical  pain - Primary   Relevant Medications   pregabalin (LYRICA) 25 MG capsule  Other Visit Diagnoses    DDD (degenerative disc disease), cervical         Discussed options. Consider trial of Lyrica.  We will need to monitor carefully for sedation which was part of the side effect she experienced from the gabapentin but I think it is worth a trial.  She does get some relief with the amitriptyline but it does affect her mood so could consider switching to nortriptyline as an option.   Could also consider cervical medial branch blocks, and if successful move onto radiofrequency neurotomy.  I be happy to refer her to Ortho or sports med for the facet injections or branch blocks if she would like.  She does still have a small amount of prednisone at home to use in case her pain gets worse.  Meds ordered this encounter  Medications  . pregabalin (LYRICA) 25 MG capsule    Sig: Take 1 capsule (25 mg total) by mouth at bedtime for 3 days, THEN 1 capsule (25 mg total) 2 (two) times daily for 3 days, THEN 2 capsules (50 mg total) 2 (two) times daily for 24 days.    Dispense:  105 capsule    Refill:  0    Follow-up: No follow-ups on file.    Beatrice Lecher, MD

## 2020-08-15 ENCOUNTER — Encounter: Payer: Self-pay | Admitting: Family Medicine

## 2020-08-25 ENCOUNTER — Other Ambulatory Visit (HOSPITAL_BASED_OUTPATIENT_CLINIC_OR_DEPARTMENT_OTHER): Payer: Self-pay

## 2020-08-25 MED FILL — Pantoprazole Sodium EC Tab 20 MG (Base Equiv): ORAL | 90 days supply | Qty: 90 | Fill #0 | Status: AC

## 2020-08-26 ENCOUNTER — Other Ambulatory Visit: Payer: Self-pay | Admitting: *Deleted

## 2020-08-26 DIAGNOSIS — E039 Hypothyroidism, unspecified: Secondary | ICD-10-CM

## 2020-08-27 LAB — TSH: TSH: 2.6 mIU/L

## 2020-09-10 ENCOUNTER — Other Ambulatory Visit (HOSPITAL_BASED_OUTPATIENT_CLINIC_OR_DEPARTMENT_OTHER): Payer: Self-pay

## 2020-09-10 ENCOUNTER — Other Ambulatory Visit: Payer: Self-pay | Admitting: Family Medicine

## 2020-09-10 DIAGNOSIS — M542 Cervicalgia: Secondary | ICD-10-CM

## 2020-09-10 MED ORDER — PREGABALIN 75 MG PO CAPS
75.0000 mg | ORAL_CAPSULE | Freq: Two times a day (BID) | ORAL | 1 refills | Status: DC
  Filled 2020-09-10: qty 180, 90d supply, fill #0
  Filled 2020-12-06: qty 180, 90d supply, fill #1

## 2020-09-10 NOTE — Telephone Encounter (Signed)
Pt would like to go up to 75 mg BID she feels that this would possibly work better for her.

## 2020-09-11 ENCOUNTER — Other Ambulatory Visit (HOSPITAL_BASED_OUTPATIENT_CLINIC_OR_DEPARTMENT_OTHER): Payer: Self-pay

## 2020-09-19 ENCOUNTER — Other Ambulatory Visit: Payer: Self-pay | Admitting: Family Medicine

## 2020-09-19 ENCOUNTER — Other Ambulatory Visit (HOSPITAL_BASED_OUTPATIENT_CLINIC_OR_DEPARTMENT_OTHER): Payer: Self-pay

## 2020-09-19 ENCOUNTER — Other Ambulatory Visit: Payer: Self-pay | Admitting: *Deleted

## 2020-09-19 DIAGNOSIS — N898 Other specified noninflammatory disorders of vagina: Secondary | ICD-10-CM

## 2020-09-19 LAB — WET PREP FOR TRICH, YEAST, CLUE
MICRO NUMBER:: 12096904
Specimen Quality: ADEQUATE

## 2020-09-19 MED ORDER — METRONIDAZOLE 500 MG PO TABS
500.0000 mg | ORAL_TABLET | Freq: Two times a day (BID) | ORAL | 0 refills | Status: DC
  Filled 2020-09-19: qty 14, 7d supply, fill #0

## 2020-10-05 ENCOUNTER — Other Ambulatory Visit: Payer: Self-pay | Admitting: Family Medicine

## 2020-10-06 ENCOUNTER — Other Ambulatory Visit (HOSPITAL_BASED_OUTPATIENT_CLINIC_OR_DEPARTMENT_OTHER): Payer: Self-pay

## 2020-10-06 MED ORDER — SYNTHROID 100 MCG PO TABS
100.0000 ug | ORAL_TABLET | Freq: Every day | ORAL | 0 refills | Status: DC
  Filled 2020-10-06: qty 90, 90d supply, fill #0

## 2020-10-06 MED ORDER — LINACLOTIDE 72 MCG PO CAPS
ORAL_CAPSULE | ORAL | 0 refills | Status: DC
  Filled 2020-10-06: qty 90, 90d supply, fill #0

## 2020-10-23 ENCOUNTER — Other Ambulatory Visit: Payer: Self-pay | Admitting: Family Medicine

## 2020-10-23 ENCOUNTER — Other Ambulatory Visit (HOSPITAL_BASED_OUTPATIENT_CLINIC_OR_DEPARTMENT_OTHER): Payer: Self-pay

## 2020-10-23 DIAGNOSIS — I7300 Raynaud's syndrome without gangrene: Secondary | ICD-10-CM

## 2020-10-23 MED ORDER — MELOXICAM 15 MG PO TABS
ORAL_TABLET | Freq: Every day | ORAL | 1 refills | Status: DC
  Filled 2020-10-23: qty 90, 90d supply, fill #0
  Filled 2021-01-14: qty 90, 90d supply, fill #1

## 2020-10-23 MED ORDER — AMLODIPINE BESYLATE 2.5 MG PO TABS
ORAL_TABLET | Freq: Every day | ORAL | 1 refills | Status: DC
  Filled 2020-10-23: qty 90, 90d supply, fill #0
  Filled 2021-01-14: qty 90, 90d supply, fill #1

## 2020-10-23 MED FILL — Baclofen Tab 10 MG: ORAL | 90 days supply | Qty: 90 | Fill #1 | Status: AC

## 2020-10-23 MED FILL — Montelukast Sodium Tab 10 MG (Base Equiv): ORAL | 90 days supply | Qty: 90 | Fill #1 | Status: AC

## 2020-10-23 MED FILL — Finasteride Tab 5 MG: ORAL | 90 days supply | Qty: 45 | Fill #1 | Status: AC

## 2020-10-23 MED FILL — Atorvastatin Calcium Tab 20 MG (Base Equivalent): ORAL | 90 days supply | Qty: 90 | Fill #1 | Status: AC

## 2020-10-31 ENCOUNTER — Telehealth: Payer: Self-pay | Admitting: Family Medicine

## 2020-10-31 DIAGNOSIS — R5383 Other fatigue: Secondary | ICD-10-CM

## 2020-10-31 DIAGNOSIS — R202 Paresthesia of skin: Secondary | ICD-10-CM

## 2020-10-31 DIAGNOSIS — M79604 Pain in right leg: Secondary | ICD-10-CM

## 2020-10-31 DIAGNOSIS — M79605 Pain in left leg: Secondary | ICD-10-CM

## 2020-10-31 DIAGNOSIS — R700 Elevated erythrocyte sedimentation rate: Secondary | ICD-10-CM

## 2020-10-31 NOTE — Telephone Encounter (Signed)
April Webster reports she hasn't been feeling well fora week.  Up until that time she been working on losing weight and actually feeling great.  But in the last week she is just felt extremely tired and wiped out she has been sleeping a little longer.  She said a few sharp tweaking type pains in her legs particularly around her right knee which is a little bit unusual.  She denies any upper respiratory symptoms.  No change in bowel movements she is taking her Linzess regularly.  No abnormal fevers or chills.  No urinary symptoms.  No chest pain etc.  April Webster just get labs to rule out change in kidney or liver function, electrolyte disturbance or change in thyroid level.  Since she has been losing weight we might need to adjust her thyroid  April Lecher, MD

## 2020-11-01 LAB — CBC
HCT: 42.2 % (ref 35.0–45.0)
Hemoglobin: 13.7 g/dL (ref 11.7–15.5)
MCH: 32.2 pg (ref 27.0–33.0)
MCHC: 32.5 g/dL (ref 32.0–36.0)
MCV: 99.1 fL (ref 80.0–100.0)
MPV: 11 fL (ref 7.5–12.5)
Platelets: 245 10*3/uL (ref 140–400)
RBC: 4.26 10*6/uL (ref 3.80–5.10)
RDW: 11.9 % (ref 11.0–15.0)
WBC: 5.5 10*3/uL (ref 3.8–10.8)

## 2020-11-01 LAB — URINALYSIS, ROUTINE W REFLEX MICROSCOPIC
Bilirubin Urine: NEGATIVE
Glucose, UA: NEGATIVE
Hgb urine dipstick: NEGATIVE
Ketones, ur: NEGATIVE
Leukocytes,Ua: NEGATIVE
Nitrite: NEGATIVE
Protein, ur: NEGATIVE
Specific Gravity, Urine: 1.022 (ref 1.001–1.035)
pH: 5.5 (ref 5.0–8.0)

## 2020-11-01 LAB — COMPLETE METABOLIC PANEL WITH GFR
AG Ratio: 2.1 (calc) (ref 1.0–2.5)
ALT: 116 U/L — ABNORMAL HIGH (ref 6–29)
AST: 63 U/L — ABNORMAL HIGH (ref 10–30)
Albumin: 4.6 g/dL (ref 3.6–5.1)
Alkaline phosphatase (APISO): 69 U/L (ref 31–125)
BUN: 23 mg/dL (ref 7–25)
CO2: 27 mmol/L (ref 20–32)
Calcium: 9.9 mg/dL (ref 8.6–10.2)
Chloride: 103 mmol/L (ref 98–110)
Creat: 0.66 mg/dL (ref 0.50–0.99)
Globulin: 2.2 g/dL (calc) (ref 1.9–3.7)
Glucose, Bld: 109 mg/dL — ABNORMAL HIGH (ref 65–99)
Potassium: 4.2 mmol/L (ref 3.5–5.3)
Sodium: 139 mmol/L (ref 135–146)
Total Bilirubin: 0.4 mg/dL (ref 0.2–1.2)
Total Protein: 6.8 g/dL (ref 6.1–8.1)
eGFR: 111 mL/min/{1.73_m2} (ref >=60)

## 2020-11-01 LAB — TSH: TSH: 2.16 mIU/L

## 2020-11-05 NOTE — Progress Notes (Signed)
OK, I will place new order and we can have pt go to lab for new draw.

## 2020-11-05 NOTE — Addendum Note (Signed)
Addended by: Beatrice Lecher D on: 11/05/2020 04:19 PM   Modules accepted: Orders

## 2020-11-07 LAB — HEPATITIS PANEL, ACUTE
Hep A IgM: NONREACTIVE
Hep B C IgM: NONREACTIVE
Hepatitis B Surface Ag: NONREACTIVE
Hepatitis C Ab: NONREACTIVE
SIGNAL TO CUT-OFF: 0.09 (ref <1.00)

## 2020-11-07 LAB — HEPATIC FUNCTION PANEL
AG Ratio: 2 (calc) (ref 1.0–2.5)
ALT: 108 U/L — ABNORMAL HIGH (ref 6–29)
AST: 62 U/L — ABNORMAL HIGH (ref 10–30)
Albumin: 4.7 g/dL (ref 3.6–5.1)
Alkaline phosphatase (APISO): 73 U/L (ref 31–125)
Bilirubin, Direct: 0.1 mg/dL (ref 0.0–0.2)
Globulin: 2.3 g/dL (calc) (ref 1.9–3.7)
Indirect Bilirubin: 0.4 mg/dL (calc) (ref 0.2–1.2)
Total Bilirubin: 0.5 mg/dL (ref 0.2–1.2)
Total Protein: 7 g/dL (ref 6.1–8.1)

## 2020-11-07 LAB — CK: Total CK: 60 U/L (ref 29–143)

## 2020-11-07 NOTE — Progress Notes (Signed)
So liver enzymes are about the same they really have not trended downward.  CK normal still waiting for the hepatitis panel.  If that comes back negative then I definitely want to do some additional work-up and an ultrasound.

## 2020-11-10 ENCOUNTER — Telehealth: Payer: Self-pay | Admitting: Family Medicine

## 2020-11-10 DIAGNOSIS — R748 Abnormal levels of other serum enzymes: Secondary | ICD-10-CM

## 2020-11-10 NOTE — Telephone Encounter (Signed)
She has had a persistent headache for the last couple of weeks.  She has been holding her Tylenol because of elevated liver enzymes and just using an NSAID in the evening.  Currently on meloxicam.

## 2020-11-11 ENCOUNTER — Other Ambulatory Visit (HOSPITAL_BASED_OUTPATIENT_CLINIC_OR_DEPARTMENT_OTHER): Payer: Self-pay

## 2020-11-11 MED ORDER — PREDNISONE 10 MG PO TABS
ORAL_TABLET | ORAL | 0 refills | Status: DC
  Filled 2020-11-11: qty 21, 5d supply, fill #0

## 2020-11-11 NOTE — Telephone Encounter (Signed)
I will use her amitriptyline as needed because it causes excess sedation the next day but she did try even taking that over the weekend and did not get relief she took it again last night.  We will treat with prednisone taper.  I think the biggest triggers that she is had to stop her chronic Tylenol use.  Meds ordered this encounter  Medications   predniSONE (DELTASONE) 10 MG tablet    Sig: 8 tabs PO Day 1, 6 tabs PO Day 2, 4 tabs PO Day 3, 2 tabs Day 4, 1 tab Day 5    Dispense:  21 tablet    Refill:  0

## 2020-11-11 NOTE — Addendum Note (Signed)
Addended by: Beatrice Lecher D on: 11/11/2020 07:47 AM   Modules accepted: Orders

## 2020-11-12 NOTE — Progress Notes (Signed)
Hi April Webster, great news is your liver enzymes are a little lower.  So hopefully it is trending back down to normal.  Hopefully you are starting to feel little better 2.  Her PT and INR are normal.  Her ferritin is a little bit low.  Study shows benefits for exercise capacity if your ferritin is greater than 40.  I would recommend considering starting an over-the-counter iron supplement or taking a multivitamin with extra iron.  And continuing to work on eating iron rich foods.  The celiac test is negative.  Meta chondral antibodies are negative.  The ANA and anti-smooth muscle antibody still pending.

## 2020-11-13 ENCOUNTER — Ambulatory Visit (INDEPENDENT_AMBULATORY_CARE_PROVIDER_SITE_OTHER): Payer: No Typology Code available for payment source

## 2020-11-13 ENCOUNTER — Other Ambulatory Visit: Payer: Self-pay

## 2020-11-13 DIAGNOSIS — R748 Abnormal levels of other serum enzymes: Secondary | ICD-10-CM | POA: Diagnosis not present

## 2020-11-13 IMAGING — US US ABDOMEN COMPLETE
1 series · 14 of 25 positions shown · non-contrast
Comparison: None.

CLINICAL DATA: Elevated liver enzymes

EXAM:
ABDOMEN ULTRASOUND COMPLETE

[Series 1: us abdomen complete · 14 of 76 slices shown]
[im 1/76]
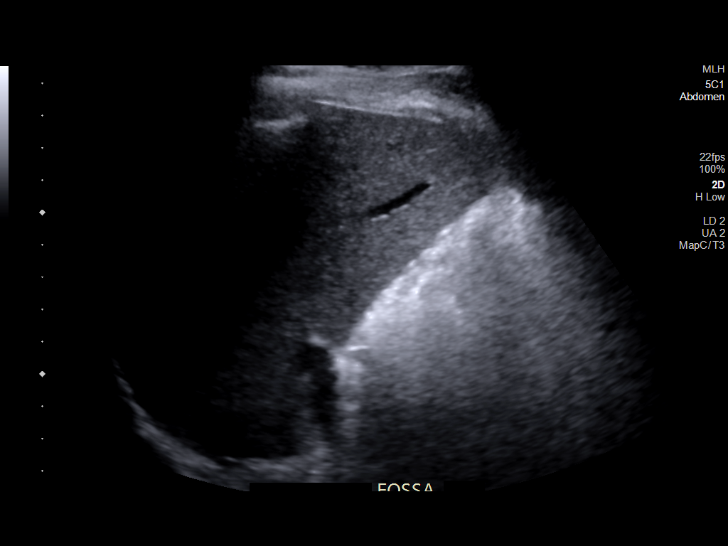
[im 7/76]
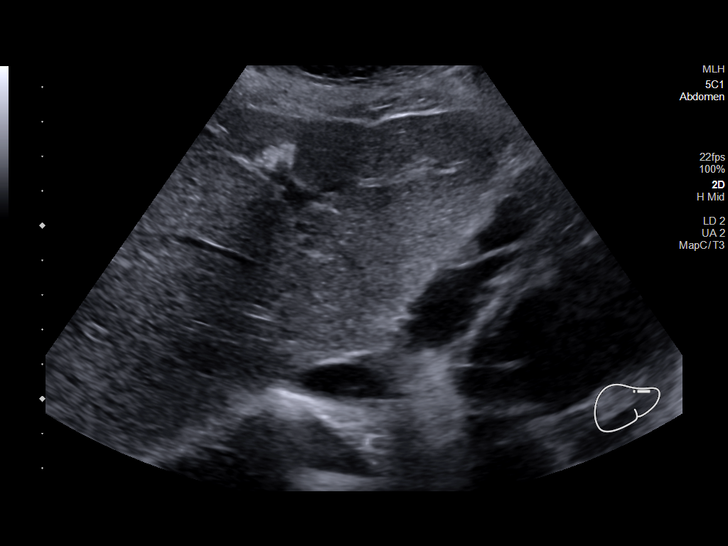
[im 13/76]
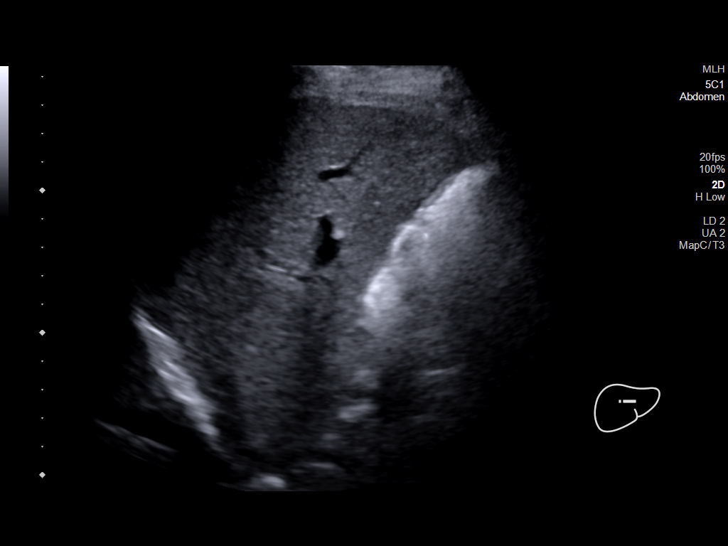
[im 19/76]
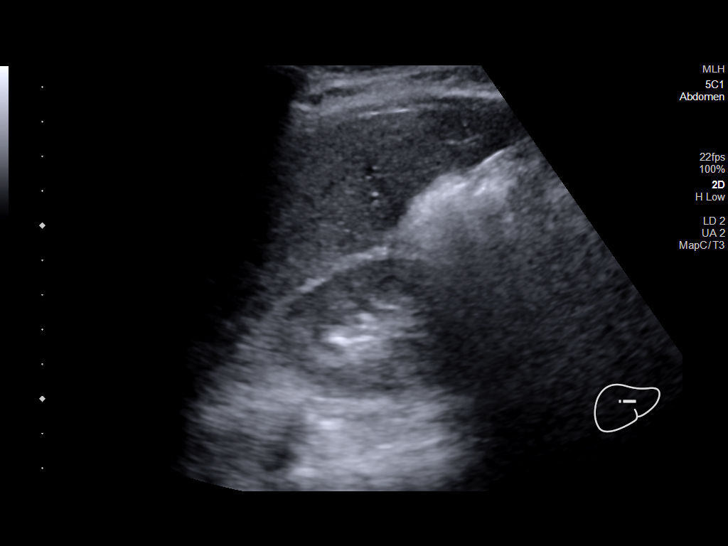
[im 26/76]
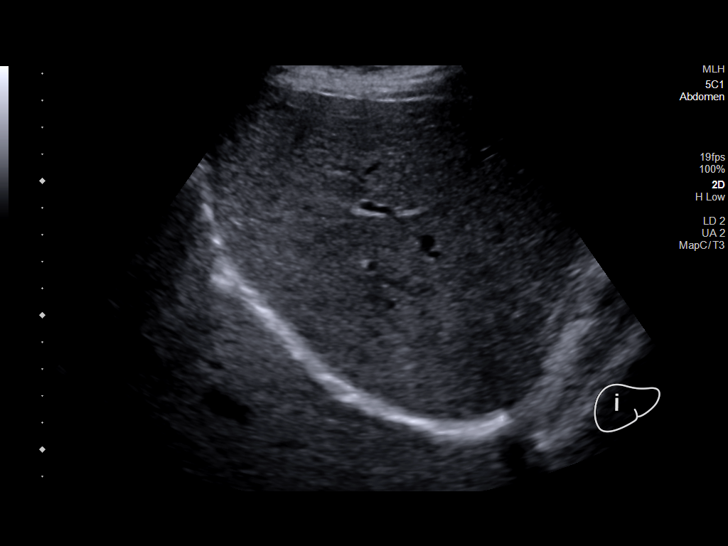
[im 29/76]
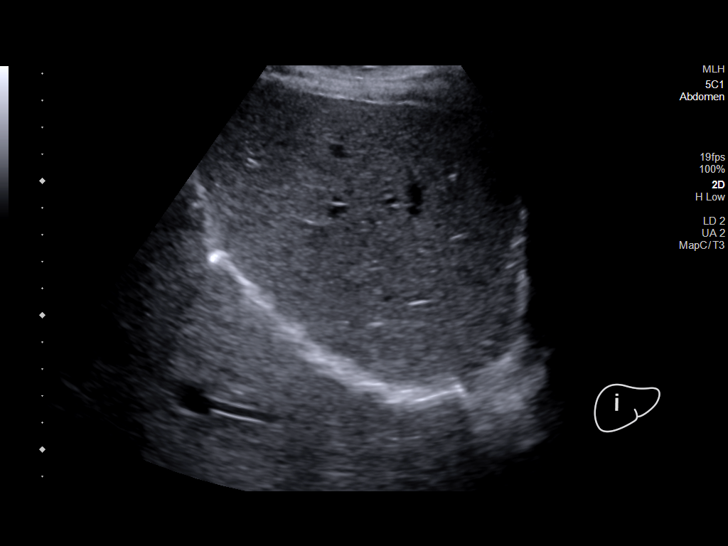
[im 35/76]
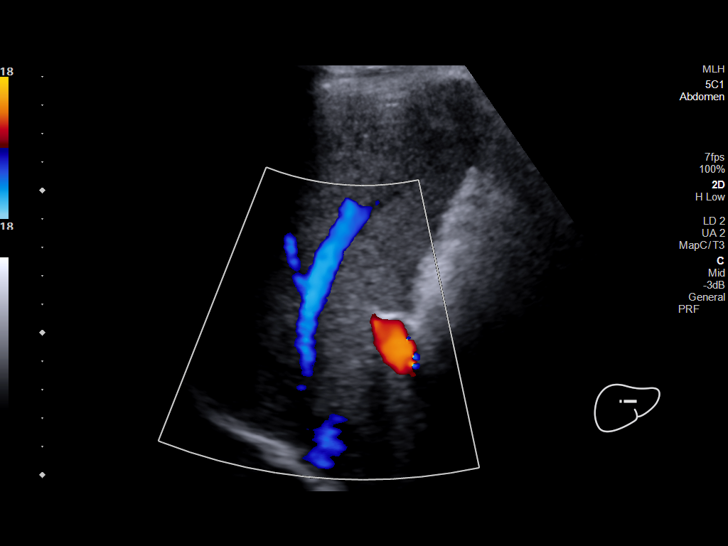
[im 41/76]
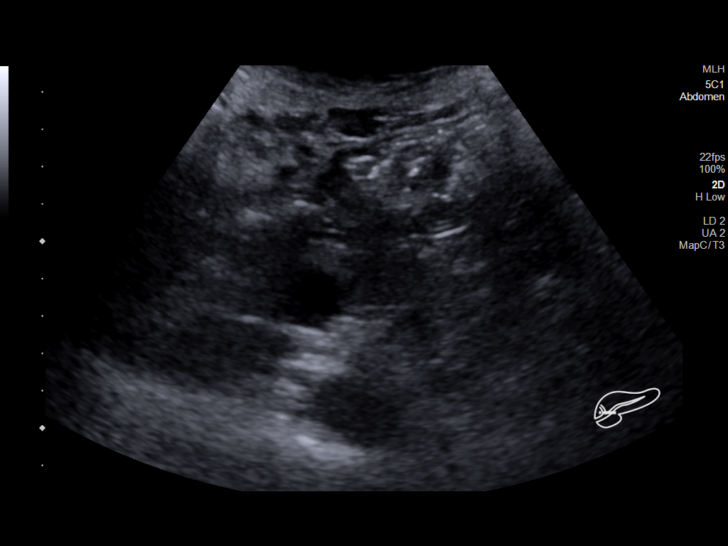
[im 47/76]
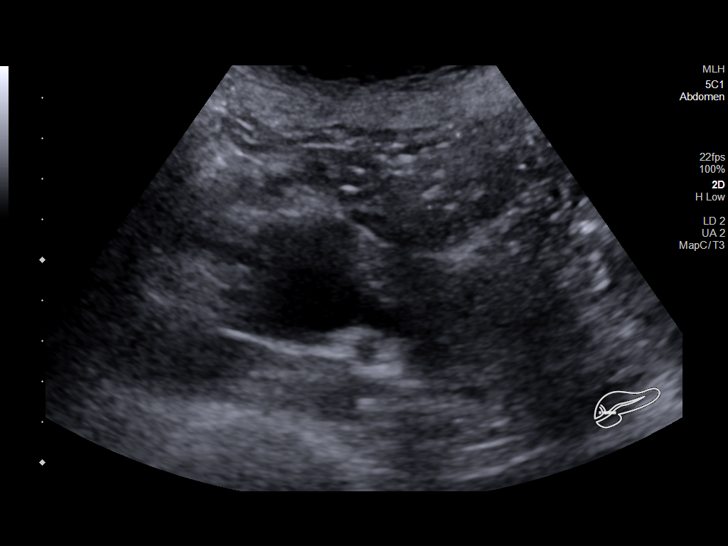
[im 51/76]
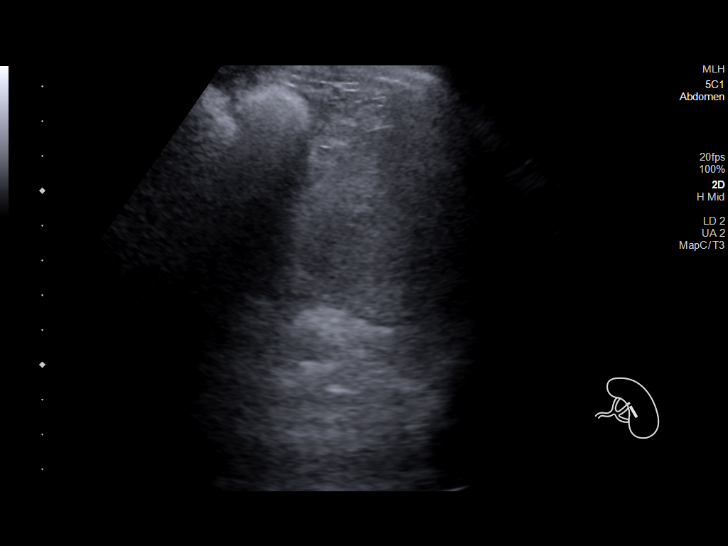
[im 57/76]
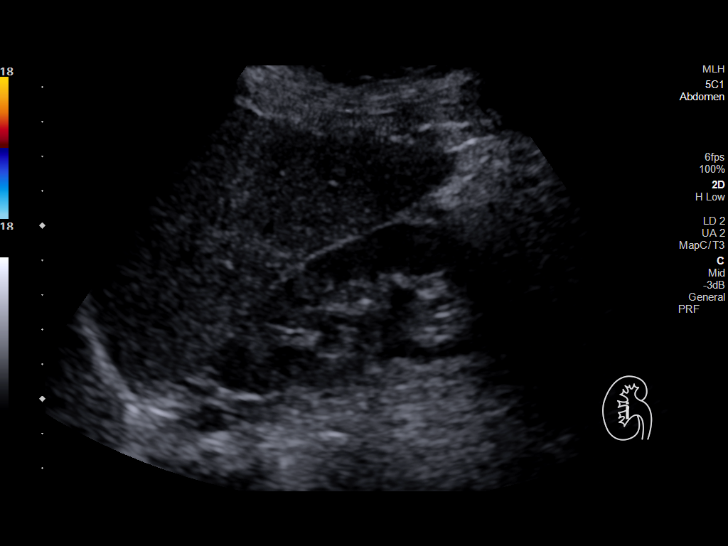
[im 63/76]
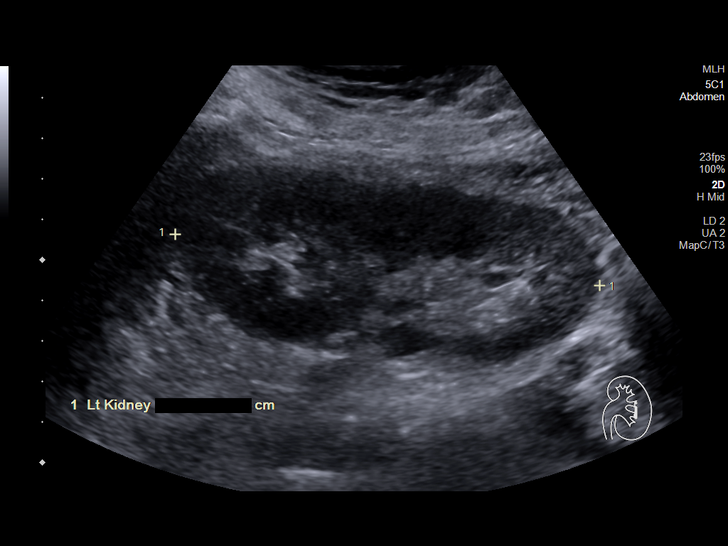
[im 69/76]
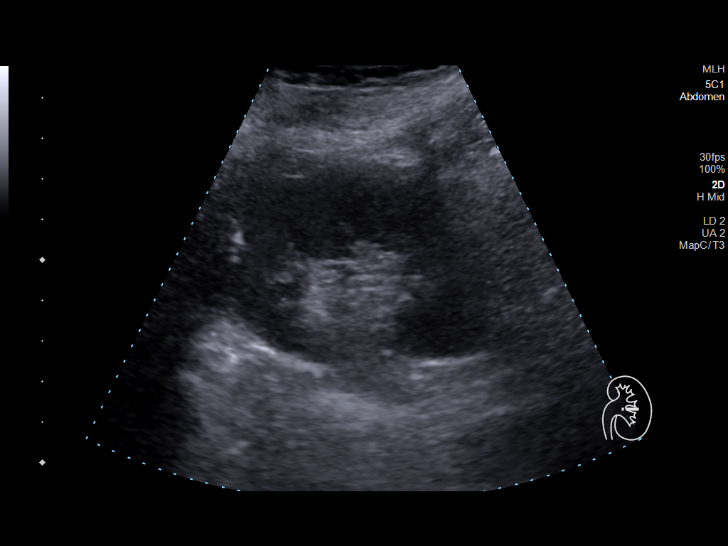
[im 76/76]
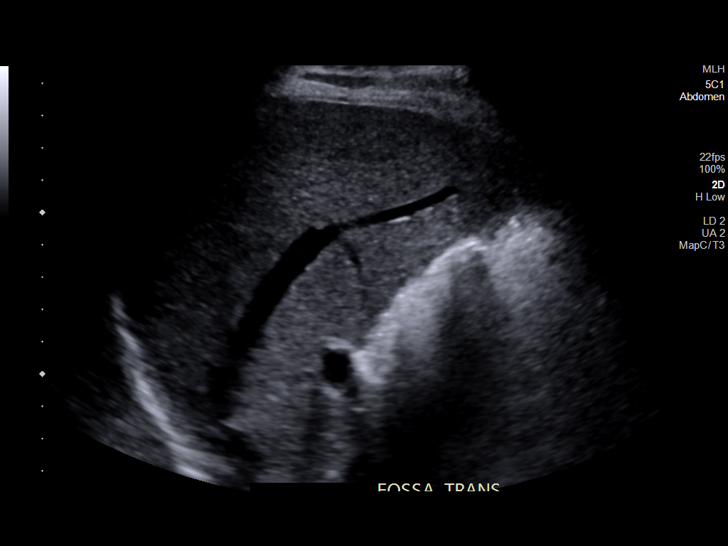

[14 of 25 positions shown; findings below may reference images not displayed]

FINDINGS: Gallbladder: Surgically absent

Common bile duct: Diameter: 2.7 mm

Liver: No focal lesion identified. Within normal limits in
parenchymal echogenicity. Portal vein is patent on color Doppler
imaging with normal direction of blood flow towards the liver.

IVC: No abnormality visualized.

Pancreas: Visualized portion unremarkable.

Spleen: Size and appearance within normal limits.

Right Kidney: Length: 10.5 cm. Echogenicity within normal limits. No
mass or hydronephrosis visualized.

Left Kidney: Length: 10.6 cm. Echogenicity within normal limits. No
mass or hydronephrosis visualized.

Abdominal aorta: No aneurysm visualized.

Other findings: None.
IMPRESSION: Status post cholecystectomy. Otherwise negative abdominal ultrasound

## 2020-11-13 NOTE — Progress Notes (Signed)
Yah!! Normal!

## 2020-11-14 LAB — ANTI-SMOOTH MUSCLE ANTIBODY, IGG: Actin (Smooth Muscle) Antibody (IGG): <20 U (ref <20)

## 2020-11-14 LAB — ANTI-NUCLEAR AB-TITER (ANA TITER)
ANA TITER: 1:80 {titer} — ABNORMAL HIGH
ANA Titer 1: 1:80 {titer} — ABNORMAL HIGH

## 2020-11-14 LAB — CBC
HCT: 40.3 % (ref 35.0–45.0)
Hemoglobin: 13.6 g/dL (ref 11.7–15.5)
MCH: 32.9 pg (ref 27.0–33.0)
MCHC: 33.7 g/dL (ref 32.0–36.0)
MCV: 97.6 fL (ref 80.0–100.0)
MPV: 10.9 fL (ref 7.5–12.5)
Platelets: 281 10*3/uL (ref 140–400)
RBC: 4.13 10*6/uL (ref 3.80–5.10)
RDW: 11.8 % (ref 11.0–15.0)
WBC: 4.1 10*3/uL (ref 3.8–10.8)

## 2020-11-14 LAB — ANA: Anti Nuclear Antibody (ANA): POSITIVE — AB

## 2020-11-14 LAB — HEPATIC FUNCTION PANEL
AG Ratio: 2 (calc) (ref 1.0–2.5)
ALT: 66 U/L — ABNORMAL HIGH (ref 6–29)
AST: 38 U/L — ABNORMAL HIGH (ref 10–30)
Albumin: 4.7 g/dL (ref 3.6–5.1)
Alkaline phosphatase (APISO): 75 U/L (ref 31–125)
Bilirubin, Direct: 0.1 mg/dL (ref 0.0–0.2)
Globulin: 2.3 g/dL (calc) (ref 1.9–3.7)
Indirect Bilirubin: 0.3 mg/dL (calc) (ref 0.2–1.2)
Total Bilirubin: 0.4 mg/dL (ref 0.2–1.2)
Total Protein: 7 g/dL (ref 6.1–8.1)

## 2020-11-14 LAB — PROTIME-INR
INR: 1
Prothrombin Time: 10.3 s (ref 9.0–11.5)

## 2020-11-14 LAB — TISSUE TRANSGLUTAMINASE ABS,IGG,IGA
(tTG) Ab, IgA: <1 U/mL
(tTG) Ab, IgG: <1 U/mL

## 2020-11-14 LAB — IGA: Immunoglobulin A: 156 mg/dL (ref 47–310)

## 2020-11-14 LAB — IRON,TIBC AND FERRITIN PANEL
%SAT: 23 % (calc) (ref 16–45)
Ferritin: 21 ng/mL (ref 16–232)
Iron: 74 ug/dL (ref 40–190)
TIBC: 320 mcg/dL (calc) (ref 250–450)

## 2020-11-14 LAB — MITOCHONDRIAL ANTIBODIES: Mitochondrial M2 Ab, IgG: <=20 U

## 2020-11-14 NOTE — Progress Notes (Signed)
Hi April Webster, your ANA is positive and similar to what it was about 8 months ago in regards to your titer.  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  I think at this point lets plan to recheck your liver enzymes in about 2 to 3 weeks to see if they are back down to normal.  If they are, then I would like to retry your statin and then recheck the enzymes again in about a month or 2 to see if they are trending back up.  Sorry, I know this is a lot of blood work.  But I feel like it is the only way to really know what is triggering this.

## 2020-11-19 ENCOUNTER — Other Ambulatory Visit: Payer: Self-pay | Admitting: *Deleted

## 2020-11-19 DIAGNOSIS — R748 Abnormal levels of other serum enzymes: Secondary | ICD-10-CM

## 2020-11-21 ENCOUNTER — Other Ambulatory Visit (HOSPITAL_BASED_OUTPATIENT_CLINIC_OR_DEPARTMENT_OTHER): Payer: Self-pay

## 2020-11-21 ENCOUNTER — Other Ambulatory Visit: Payer: Self-pay | Admitting: Family Medicine

## 2020-11-21 DIAGNOSIS — K219 Gastro-esophageal reflux disease without esophagitis: Secondary | ICD-10-CM

## 2020-11-21 MED ORDER — PANTOPRAZOLE SODIUM 20 MG PO TBEC
DELAYED_RELEASE_TABLET | Freq: Every day | ORAL | 3 refills | Status: DC
  Filled 2020-11-21: qty 90, 90d supply, fill #0
  Filled 2021-02-16: qty 90, 90d supply, fill #1

## 2020-11-28 ENCOUNTER — Other Ambulatory Visit: Payer: Self-pay | Admitting: Family Medicine

## 2020-11-28 ENCOUNTER — Telehealth: Payer: Self-pay | Admitting: Family Medicine

## 2020-11-28 ENCOUNTER — Other Ambulatory Visit (HOSPITAL_BASED_OUTPATIENT_CLINIC_OR_DEPARTMENT_OTHER): Payer: Self-pay

## 2020-11-28 DIAGNOSIS — I7300 Raynaud's syndrome without gangrene: Secondary | ICD-10-CM

## 2020-11-28 MED ORDER — FLUOXETINE HCL 10 MG PO CAPS
10.0000 mg | ORAL_CAPSULE | Freq: Every day | ORAL | 0 refills | Status: DC
  Filled 2020-11-28: qty 90, 90d supply, fill #0

## 2020-11-28 MED ORDER — FLUOXETINE HCL 10 MG PO CAPS
10.0000 mg | ORAL_CAPSULE | Freq: Every day | ORAL | 0 refills | Status: DC
  Filled 2020-12-01: qty 90, 90d supply, fill #0

## 2020-11-28 NOTE — Progress Notes (Signed)
Re sent med.

## 2020-11-28 NOTE — Telephone Encounter (Signed)
Patient reports that she has been having to hold her amlodipine because of low blood pressures especially since she is lost weight and she has been exercising regularly.  She would like to try fluoxetine for her Raynaud's as her Raynaud's has actually been getting worse.  Only uses amitriptyline low-dose as needed.  Meds ordered this encounter  Medications   FLUoxetine (PROZAC) 10 MG capsule    Sig: Take 1 capsule (10 mg total) by mouth daily.    Dispense:  90 capsule    Refill:  0

## 2020-12-01 ENCOUNTER — Other Ambulatory Visit (HOSPITAL_BASED_OUTPATIENT_CLINIC_OR_DEPARTMENT_OTHER): Payer: Self-pay

## 2020-12-05 LAB — HEPATIC FUNCTION PANEL
AG Ratio: 1.9 (calc) (ref 1.0–2.5)
ALT: 101 U/L — ABNORMAL HIGH (ref 6–29)
AST: 75 U/L — ABNORMAL HIGH (ref 10–30)
Albumin: 4.5 g/dL (ref 3.6–5.1)
Alkaline phosphatase (APISO): 63 U/L (ref 31–125)
Bilirubin, Direct: 0.1 mg/dL (ref 0.0–0.2)
Globulin: 2.4 g/dL (calc) (ref 1.9–3.7)
Indirect Bilirubin: 0.3 mg/dL (calc) (ref 0.2–1.2)
Total Bilirubin: 0.4 mg/dL (ref 0.2–1.2)
Total Protein: 6.9 g/dL (ref 6.1–8.1)

## 2020-12-08 ENCOUNTER — Other Ambulatory Visit: Payer: Self-pay | Admitting: Family Medicine

## 2020-12-08 ENCOUNTER — Other Ambulatory Visit (HOSPITAL_COMMUNITY): Payer: Self-pay

## 2020-12-09 ENCOUNTER — Encounter: Payer: Self-pay | Admitting: Family Medicine

## 2020-12-09 DIAGNOSIS — R748 Abnormal levels of other serum enzymes: Secondary | ICD-10-CM

## 2020-12-11 NOTE — Progress Notes (Signed)
Patient notified of results.  She has been holding her statin.  Referral sent to Dr. Bryan Lemma.

## 2020-12-23 ENCOUNTER — Encounter: Payer: Self-pay | Admitting: Gastroenterology

## 2020-12-23 ENCOUNTER — Other Ambulatory Visit: Payer: Self-pay

## 2020-12-23 ENCOUNTER — Ambulatory Visit (INDEPENDENT_AMBULATORY_CARE_PROVIDER_SITE_OTHER): Payer: No Typology Code available for payment source | Admitting: Gastroenterology

## 2020-12-23 ENCOUNTER — Other Ambulatory Visit (HOSPITAL_BASED_OUTPATIENT_CLINIC_OR_DEPARTMENT_OTHER): Payer: Self-pay

## 2020-12-23 VITALS — BP 132/82 | HR 70 | Ht 63.0 in | Wt 131.0 lb

## 2020-12-23 DIAGNOSIS — K5909 Other constipation: Secondary | ICD-10-CM

## 2020-12-23 DIAGNOSIS — K219 Gastro-esophageal reflux disease without esophagitis: Secondary | ICD-10-CM | POA: Diagnosis not present

## 2020-12-23 DIAGNOSIS — R1013 Epigastric pain: Secondary | ICD-10-CM

## 2020-12-23 DIAGNOSIS — Z1159 Encounter for screening for other viral diseases: Secondary | ICD-10-CM

## 2020-12-23 DIAGNOSIS — R7401 Elevation of levels of liver transaminase levels: Secondary | ICD-10-CM

## 2020-12-23 MED ORDER — LINACLOTIDE 290 MCG PO CAPS
290.0000 ug | ORAL_CAPSULE | Freq: Every day | ORAL | 3 refills | Status: DC
  Filled 2020-12-23: qty 90, 90d supply, fill #0
  Filled 2021-05-09: qty 30, 30d supply, fill #1

## 2020-12-23 NOTE — Patient Instructions (Signed)
If you are age 44 or older, your body mass index should be between 23-30. Your Body mass index is 23.21 kg/m. If this is out of the aforementioned range listed, please consider follow up with your Primary Care Provider.  If you are age 1 or younger, your body mass index should be between 19-25. Your Body mass index is 23.21 kg/m. If this is out of the aformentioned range listed, please consider follow up with your Primary Care Provider.   __________________________________________________________  The [facility] providers would like to encourage you to use [facility] to communicate with providers for non-urgent requests or questions.  Due to long hold times on the telephone, sending your provider a message by [facility] may be a faster and more efficient way to get a response.  Please allow 48 business hours for a response.  Please remember that this is for non-urgent requests.   Due to recent changes in healthcare laws, you may see the results of your imaging and laboratory studies on MyChart before your provider has had a chance to review them.  We understand that in some cases there may be results that are confusing or concerning to you. Not all laboratory results come back in the same time frame and the provider may be waiting for multiple results in order to interpret others.  Please give [place] 48 hours in order for your provider to thoroughly review all the results before contacting the office for clarification of your results.   Please have your labs drawn tomorrow.  Thank you for choosing me and [facility].  Vito Cirigliano, D.O.

## 2020-12-23 NOTE — Progress Notes (Signed)
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  Recently noted elevated AST < ALT with otherwise normal ALP, T bili with trend as below: -03/27/2019: 21/18 - 03/10/2020: 25/28 - 10/31/2020: 63/116 (statin held) - 11/06/2020: 62/108  - 11/11/2020: 38/66 - 12/04/2020: 75/101  Extended serologic work-up in 10/2020 notable for the following: - ANA positive at 1:80 (nuclear, speckled pattern) - Negative/normal acute viral hepatitis panel, CK, iron panel, TTG, AMA, ASMA, IgA - Normal INR, CBC, BMP  -11/13/2020: RUQ [place]: s/p ccy otherwise normal  She does report starting intensive exercise regimen 7 days/week and healthy eating habits for the last 6+ months.  This includes no EtOH in last 6 months.  Otherwise denies any APAP.  Does not take any herbal supplements, dietary/weight loss supplements.  Statin was held in 10/2020 when elevated enzymes were noted, but still with persistent elevations.  No jaundice, icteric sclera, GI bleed, confusion.   Separately, longstanding history of chronic constipation.  Initially controlled with Linzess 72 mcg/day, and eventually had to titrate up to 145 mcg/day, but eventually decreased efficacy, now requiring fiber supplement and Senokot 4 times/week.  No previous colonoscopy.    MGF with stomach CA.  Otherwise no known history of colon cancer, IBD.   Past Medical History:  Diagnosis Date   Chronic idiopathic constipation    DDD (degenerative disc disease), cervical and lumbar    Female pattern hair loss    Graves disease    Hyperlipidemia    Hypertension    Lichen sclerosus    Raynaud's disease      Past Surgical History:  Procedure Laterality Date    ABDOMINOPLASTY     AUGMENTATION MAMMAPLASTY Bilateral 56/4332   Silicone    PLACEMENT OF BREAST IMPLANTS     THYROID SURGERY     Family History  Problem Relation Age of Onset   Hypertension Mother    Hyperlipidemia Mother    Osteoporosis Mother    Lung cancer Father    Heart attack Maternal Grandmother    Stomach cancer Maternal Grandfather    Colon cancer Neg Hx    Rectal cancer Neg Hx    Liver cancer Neg Hx    Esophageal cancer Neg Hx    Pancreatic cancer Neg Hx    Social History   Tobacco Use   Smoking status: Former    Packs/day: 2.00    Years: 5.00    Pack years: 10.00    Types: Cigarettes    Quit date: 03/15/1996    Years since quitting: 24.7   Smokeless tobacco: Never  Vaping Use   Vaping Use: Never used  Substance Use Topics   Alcohol use: Not Currently    Alcohol/week: 2.0 standard drinks    Types: 2 Glasses of wine per week   Drug use: Never   Current Outpatient Medications  Medication Sig Dispense Refill   amitriptyline (ELAVIL) 25 MG tablet Take 1-2 tablets (25-50 mg total) by mouth at bedtime. 60 tablet 1   amLODipine (NORVASC) 2.5 MG tablet TAKE 1 TABLET (2.5 MG TOTAL) BY MOUTH DAILY. 90 tablet 1   baclofen (LIORESAL) 10 MG tablet TAKE 1  TABLET (10 MG TOTAL) BY MOUTH AT BEDTIME. 90 tablet 3   clobetasol ointment (TEMOVATE) 2.53 % Apply 1 application topically daily as needed for rash. LIchen sclerosis     diphenhydrAMINE (BENADRYL) 25 MG tablet Take 25 mg by mouth at bedtime as needed for sleep.     finasteride (PROSCAR) 5 MG tablet TAKE 1/2 TABLET (2.5 MG TOTAL) BY MOUTH DAILY. 45 tablet 3   FLUoxetine (PROZAC) 10 MG capsule Take 1 capsule (10 mg total) by mouth daily. 90 capsule 0   levocetirizine (XYZAL) 5 MG tablet Take 1 tablet (5 mg total) by mouth every evening. 90 tablet 3   levonorgestrel (MIRENA) 20 MCG/24HR IUD by Intrauterine route.     linaclotide (LINZESS) 145 MCG CAPS capsule Take 145 mcg by mouth daily before breakfast.     meloxicam  (MOBIC) 15 MG tablet TAKE 1 TABLET (15 MG TOTAL) BY MOUTH DAILY. 90 tablet 1   montelukast (SINGULAIR) 10 MG tablet TAKE 1 TABLET (10 MG TOTAL) BY MOUTH AT BEDTIME. 90 tablet 3   pantoprazole (PROTONIX) 20 MG tablet TAKE 1 TABLET (20 MG TOTAL) BY MOUTH DAILY. 90 tablet 3   pregabalin (LYRICA) 75 MG capsule Take 1 capsule (75 mg total) by mouth 2 (two) times daily. 180 capsule 1   SYNTHROID 100 MCG tablet Take 1 tablet (100 mcg total) by mouth daily before breakfast. 90 tablet 0   atorvastatin (LIPITOR) 20 MG tablet TAKE 1 TABLET (20 MG TOTAL) BY MOUTH IN THE MORNING. (Patient not taking: Reported on 12/23/2020) 90 tablet 3   No current facility-administered medications for this visit.   Allergies  Allergen Reactions   Cefuroxime Axetil Rash   Tuberculin Purified Protein Derivative Rash    Erythema and itching without induration   Ambien [Zolpidem] Other (See Comments)    Sleep talking   Cymbalta [Duloxetine Hcl] Other (See Comments)    Urinary retention.      Review of Systems: All systems reviewed and negative except where noted in HPI.     Physical Exam:    Wt Readings from Last 3 Encounters:  12/23/20 131 lb (59.4 kg)  08/14/20 134 lb (60.8 kg)  06/26/20 136 lb (61.7 kg)    BP 132/82   Pulse 70   Ht 5' 3"  (1.6 m)   Wt 131 lb (59.4 kg)   SpO2 98%   BMI 23.21 kg/m  Constitutional:  Pleasant, in no acute distress. Psychiatric: Normal mood and affect. Behavior is normal. Cardiovascular: Normal rate, regular rhythm. No edema Pulmonary/chest: Effort normal and breath sounds normal. No wheezing, rales or rhonchi. Abdominal: Soft, nondistended, nontender. Bowel sounds active throughout. There are no masses palpable. No hepatomegaly. Neurological: Alert and oriented to person place and time. Skin: Skin is warm and dry. No rashes noted.   ASSESSMENT AND PLAN;   1) Elevated liver enzymes (ALT> AST) 2) ANA positive Newly noted elevated liver enzymes starting in 10/2020.   No change since stopping statin therapy.  Interestingly, elevated enzymes are in the setting of starting intensive exercise regimen and healthy eating habits for the last 6+ months.  While elevated enzymes can be attributed to strenuous exercise, would expect to see elevated CK, but this was normal (could also see elevated LDH, which can be checked with next set of enzymes).  Additional DDx includes AIH, proteinuria given autoimmune history.  Plan to evaluate as below:  -Labs ordered to complete serologic work-up: A1 AT, ceruloplasmin, LKM antibody, IgG, IgM - Check HBsAb, HBcAb, HAV  Ab to evaluate for proper immunity - If above evaluation unrevealing, will again repeat liver enzymes along with LDH and repeat CK.  If enzymes still elevated, discussed plan to halt exercise for couple weeks with repeat enzymes again - Pending the above, discussed plan for liver biopsy if unrevealing work-up and enzymes remain elevated   3) Chronic constipation - Increase Linzess to 290 mcg/day - Maintain adequate hydration and healthy eating habits is currently doing  4) History of Raynaud's 5) History of Graves' disease - As above, evaluating for AIH given underlying autoimmune history    April Bullion, DO, FACG  12/23/2020, 4:10 PM   Hali Marry, *

## 2020-12-24 ENCOUNTER — Other Ambulatory Visit: Payer: Self-pay

## 2020-12-24 ENCOUNTER — Other Ambulatory Visit: Payer: Self-pay | Admitting: Gastroenterology

## 2020-12-24 DIAGNOSIS — Z1159 Encounter for screening for other viral diseases: Secondary | ICD-10-CM

## 2020-12-24 DIAGNOSIS — K5909 Other constipation: Secondary | ICD-10-CM

## 2020-12-24 DIAGNOSIS — R1013 Epigastric pain: Secondary | ICD-10-CM

## 2020-12-24 DIAGNOSIS — K219 Gastro-esophageal reflux disease without esophagitis: Secondary | ICD-10-CM

## 2020-12-29 LAB — HEPATITIS A ANTIBODY, TOTAL: Hepatitis A AB,Total: NONREACTIVE

## 2020-12-29 LAB — HEPATITIS B CORE ANTIBODY, TOTAL: Hep B Core Total Ab: NONREACTIVE

## 2020-12-29 LAB — HEPATITIS B CORE ANTIBODY, IGM: Hep B C IgM: NONREACTIVE

## 2020-12-29 LAB — IGM: IgM, Serum: 125 mg/dL (ref 50–300)

## 2020-12-29 LAB — ALPHA-1-ANTITRYPSIN: A-1 Antitrypsin, Ser: 137 mg/dL (ref 83–199)

## 2020-12-29 LAB — CERULOPLASMIN: Ceruloplasmin: 24 mg/dL (ref 18–53)

## 2020-12-29 LAB — ANTI-MICROSOMAL ANTIBODY LIVER / KIDNEY: LKM1 Ab: <=20 U (ref <=20.0)

## 2020-12-29 LAB — IGG: IgG (Immunoglobin G), Serum: 949 mg/dL (ref 600–1640)

## 2020-12-31 DIAGNOSIS — R1013 Epigastric pain: Secondary | ICD-10-CM

## 2020-12-31 DIAGNOSIS — R7401 Elevation of levels of liver transaminase levels: Secondary | ICD-10-CM

## 2020-12-31 DIAGNOSIS — Z1159 Encounter for screening for other viral diseases: Secondary | ICD-10-CM

## 2021-01-01 ENCOUNTER — Other Ambulatory Visit (HOSPITAL_BASED_OUTPATIENT_CLINIC_OR_DEPARTMENT_OTHER): Payer: Self-pay

## 2021-01-01 ENCOUNTER — Telehealth: Payer: Self-pay | Admitting: Family Medicine

## 2021-01-01 ENCOUNTER — Other Ambulatory Visit: Payer: Self-pay | Admitting: *Deleted

## 2021-01-01 ENCOUNTER — Telehealth: Payer: Self-pay | Admitting: General Surgery

## 2021-01-01 DIAGNOSIS — R1013 Epigastric pain: Secondary | ICD-10-CM

## 2021-01-01 DIAGNOSIS — Z1159 Encounter for screening for other viral diseases: Secondary | ICD-10-CM

## 2021-01-01 DIAGNOSIS — R7401 Elevation of levels of liver transaminase levels: Secondary | ICD-10-CM

## 2021-01-01 MED ORDER — TRAMADOL HCL 50 MG PO TABS
50.0000 mg | ORAL_TABLET | Freq: Three times a day (TID) | ORAL | 0 refills | Status: AC | PRN
  Filled 2021-01-01: qty 30, 5d supply, fill #0

## 2021-01-01 NOTE — Telephone Encounter (Signed)
The patient received her labs via mychart and already new the results. She had additional labs drawn today by her PCP. One test was the Hep b surface antibody. Expressed to the patient we will call with results.

## 2021-01-01 NOTE — Telephone Encounter (Signed)
-----   Message from [place], DO sent at 12/30/2020  1:17 PM EDT ----- Good news, the remainder of the serologic work-up for elevated liver enzymes is normal/negative, to include normal IgG, IgM, anti-L KM, alpha-1 antitrypsin, and ceruloplasmin, along with Hep B core antibody and core IgM.  Hepatitis A antibody was negative; recommend hepatitis a vaccine series to recapture immunity.  Does not look like hepatitis B surface antibody was obtained.  Can we please add that to the previously collected labs to determine whether or not vaccine is needed.

## 2021-01-01 NOTE — Telephone Encounter (Signed)
-----   Message from [place], DO sent at 12/30/2020  1:17 PM EDT ----- Good news, the remainder of the serologic work-up for elevated liver enzymes is normal/negative, to include normal IgG, IgM, anti-L KM, alpha-1 antitrypsin, and ceruloplasmin, along with Hep B core antibody and core IgM.  Hepatitis A antibody was negative; recommend hepatitis a vaccine series to recapture immunity.  Does not look like hepatitis B surface antibody was obtained.  Can we please add that to the previously collected labs to determine whether or not vaccine is needed.

## 2021-01-01 NOTE — Telephone Encounter (Signed)
Complaints of right hip pain for 3 weeks.  Started after she started a new hip flexor strengthening exercise.  Its been painful.  Muscle relaxer that she takes for her neck is not helping she is already on Lyrica.  And she uses baclofen as needed.  We will send in short supply of tramadol to use as needed.  Consider follow-up with sports medicine if not improving.

## 2021-01-01 NOTE — Telephone Encounter (Signed)
Per Langley Gauss test was added, Hep B surface antibody

## 2021-01-02 ENCOUNTER — Telehealth: Payer: Self-pay | Admitting: General Surgery

## 2021-01-02 ENCOUNTER — Ambulatory Visit (INDEPENDENT_AMBULATORY_CARE_PROVIDER_SITE_OTHER): Payer: No Typology Code available for payment source | Admitting: Sports Medicine

## 2021-01-02 ENCOUNTER — Other Ambulatory Visit: Payer: Self-pay

## 2021-01-02 ENCOUNTER — Ambulatory Visit (INDEPENDENT_AMBULATORY_CARE_PROVIDER_SITE_OTHER): Payer: No Typology Code available for payment source

## 2021-01-02 DIAGNOSIS — M25551 Pain in right hip: Secondary | ICD-10-CM | POA: Insufficient documentation

## 2021-01-02 DIAGNOSIS — R7401 Elevation of levels of liver transaminase levels: Secondary | ICD-10-CM

## 2021-01-02 LAB — HEPATIC FUNCTION PANEL
AG Ratio: 2 (calc) (ref 1.0–2.5)
ALT: 155 U/L — ABNORMAL HIGH (ref 6–29)
AST: 89 U/L — ABNORMAL HIGH (ref 10–30)
Albumin: 4.4 g/dL (ref 3.6–5.1)
Alkaline phosphatase (APISO): 64 U/L (ref 31–125)
Bilirubin, Direct: 0.1 mg/dL (ref 0.0–0.2)
Globulin: 2.2 g/dL (calc) (ref 1.9–3.7)
Indirect Bilirubin: 0.3 mg/dL (calc) (ref 0.2–1.2)
Total Bilirubin: 0.4 mg/dL (ref 0.2–1.2)
Total Protein: 6.6 g/dL (ref 6.1–8.1)

## 2021-01-02 LAB — LACTATE DEHYDROGENASE: LDH: 164 U/L (ref 100–200)

## 2021-01-02 LAB — HEPATITIS B SURFACE ANTIBODY,QUALITATIVE: Hep B S Ab: REACTIVE — AB

## 2021-01-02 LAB — CK: Total CK: 68 U/L (ref 29–143)

## 2021-01-02 IMAGING — DX DG HIP (WITH OR WITHOUT PELVIS) 2-3V*R*
3 series · 3 of 3 positions shown · non-contrast
Comparison: None.

CLINICAL DATA: Anterior right hip pain.

EXAM:
DG HIP (WITH OR WITHOUT PELVIS) 2-3V RIGHT

[pelvis ap]
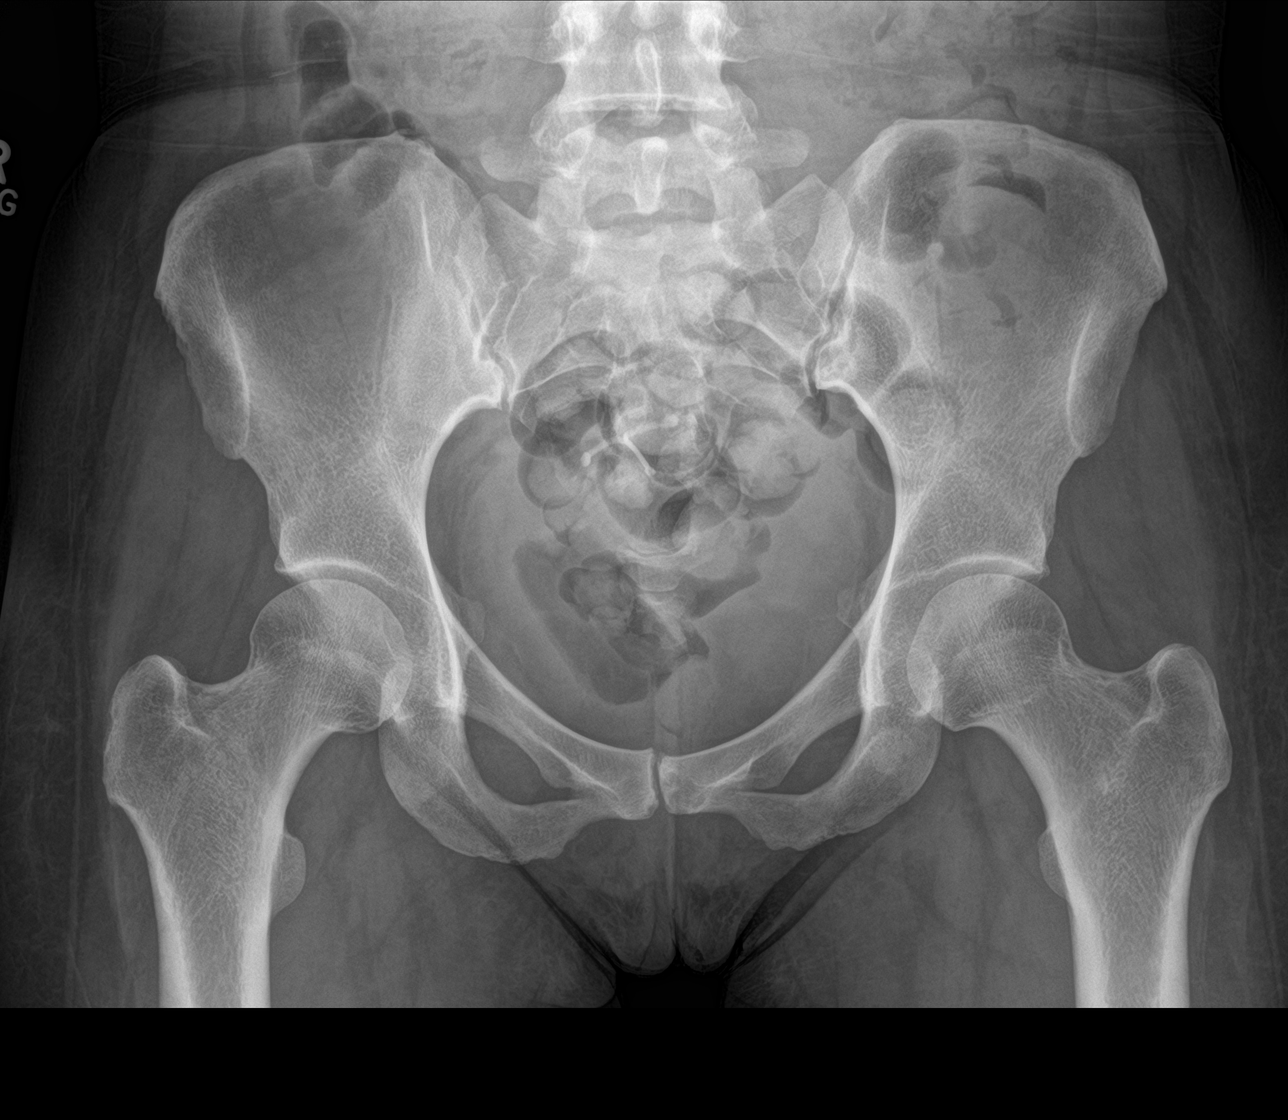

[hip ap]
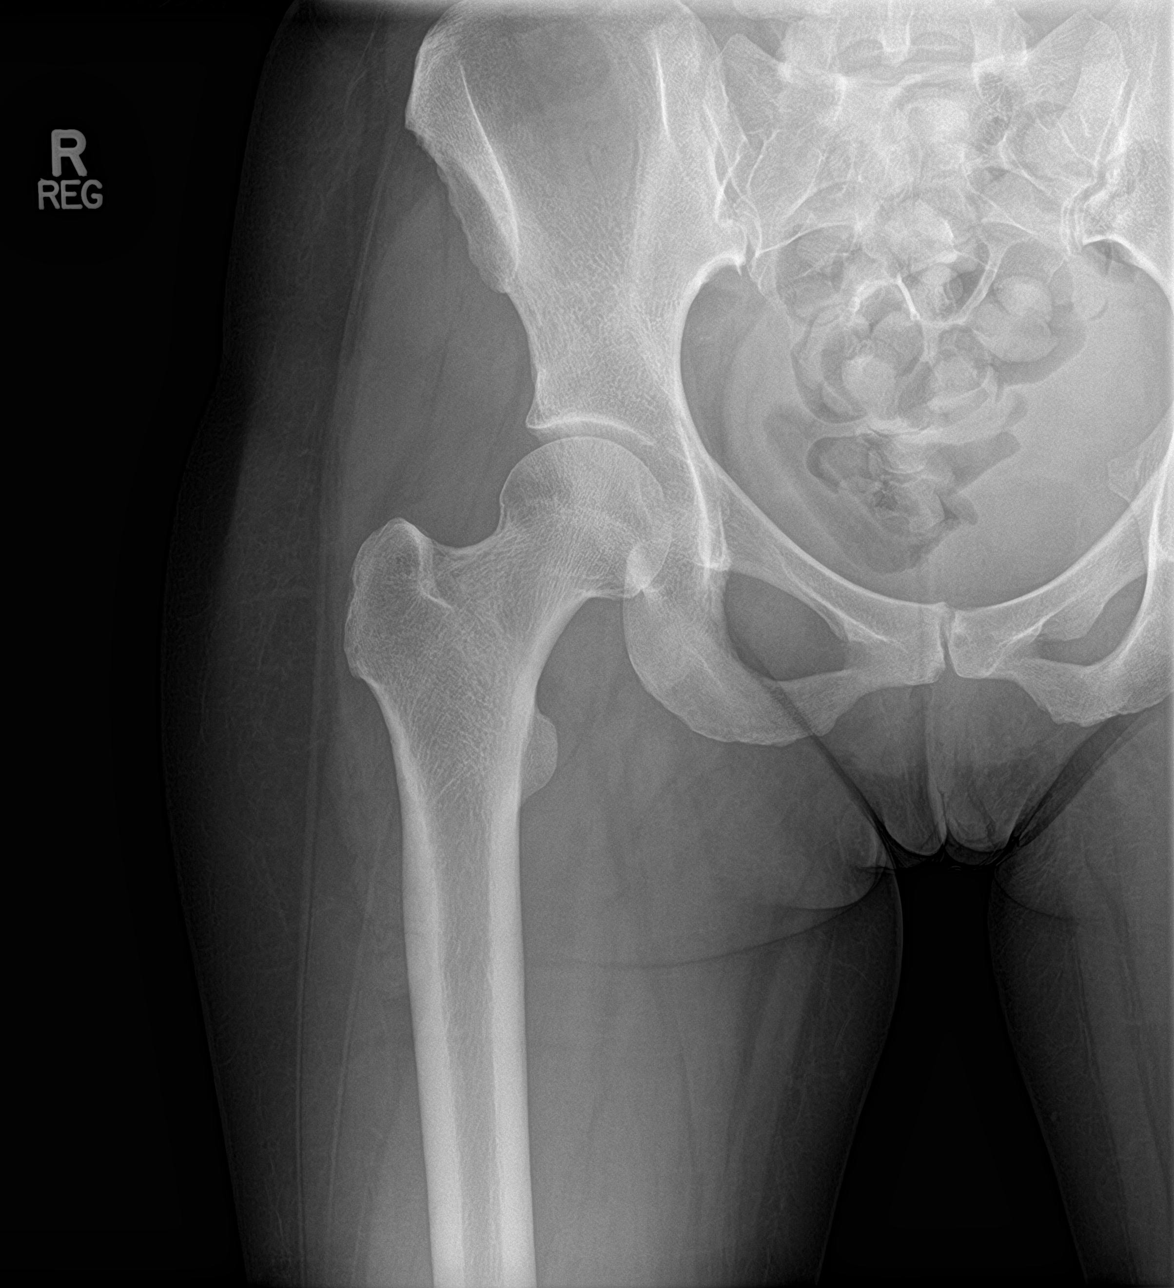

[hip lat]
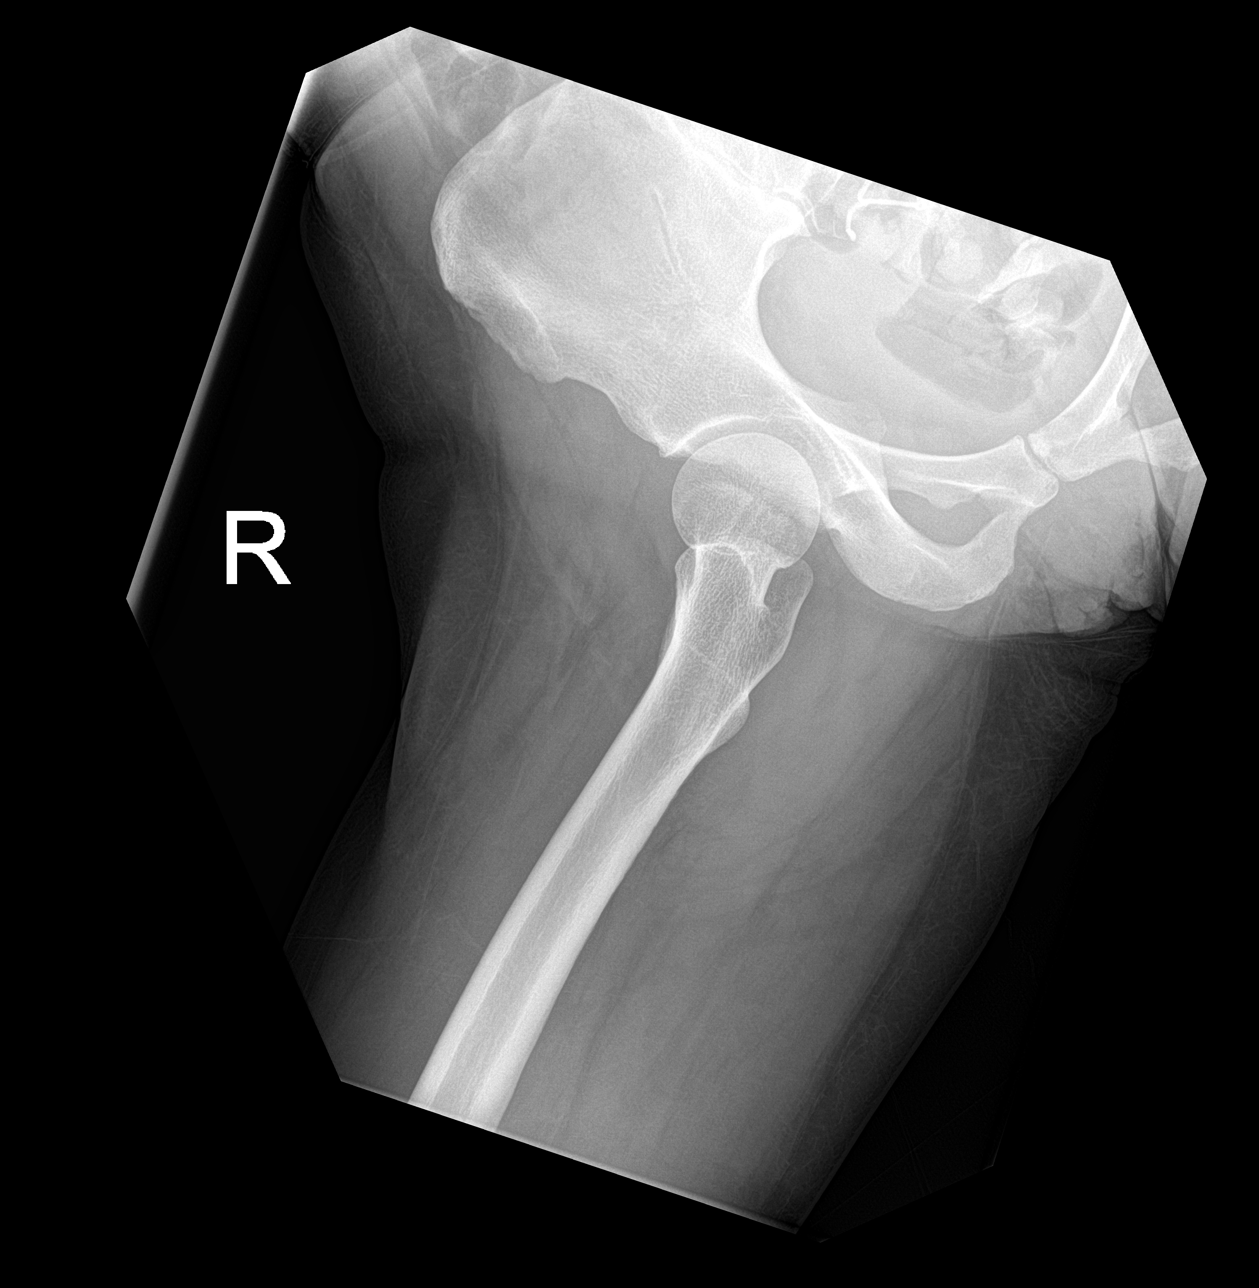

[3 of 3 positions shown; findings below may reference images not displayed]

FINDINGS: There is no evidence of hip fracture or dislocation. There is no
evidence of arthropathy or other focal bone abnormality.
IMPRESSION: Negative.

## 2021-01-02 NOTE — Assessment & Plan Note (Signed)
Pleasant 45 year old female, increasing right hip pain while working out, endorses the C sign, on exam positive FADIR sign all consistent with a hip labral injury, started conservatively, NSAIDs OTC, x-rays, hip conditioning exercises for 6 weeks, if insufficient improvement we will do an MR arthrography.

## 2021-01-02 NOTE — Progress Notes (Signed)
    Procedures performed today:    None.  Independent interpretation of notes and tests performed by another provider:   None.  Brief History, Exam, Impression, and Recommendations:    Right hip pain Pleasant 45 year old female, increasing right hip pain while working out, endorses the C sign, on exam positive FADIR sign all consistent with a hip labral injury, started conservatively, NSAIDs OTC, x-rays, hip conditioning exercises for 6 weeks, if insufficient improvement we will do an MR arthrography.   ___________________________________________ Gwen Her. Dianah Field, M.D., ABFM., CAQSM. Primary Care and [place] Instructor of [place] [facility]

## 2021-01-02 NOTE — Telephone Encounter (Signed)
-----   Message from [place], DO sent at 01/02/2021  4:33 PM EDT ----- Labs are notable for the following: -AST/ALT still elevated at 89 and 155, respectively.  Remainder of the CMP still otherwise normal. - Normal LDH and CK -Hepatitis B surface antibody positive, consistent with appropriate immunity  My suspicion for this being exercise-induced elevation of transaminases is somewhat reduced given the normal LDH and CK levels.  It is worth a trial of stopping strenuous exercise for 2 weeks then repeat liver enzymes, but I suspect we will ultimately need liver biopsy.  Will eventually plan for hepatitis A vaccine series.

## 2021-01-05 ENCOUNTER — Other Ambulatory Visit: Payer: Self-pay | Admitting: Family Medicine

## 2021-01-05 ENCOUNTER — Telehealth: Payer: Self-pay | Admitting: General Surgery

## 2021-01-05 ENCOUNTER — Other Ambulatory Visit (HOSPITAL_BASED_OUTPATIENT_CLINIC_OR_DEPARTMENT_OTHER): Payer: Self-pay

## 2021-01-05 MED ORDER — SYNTHROID 100 MCG PO TABS
100.0000 ug | ORAL_TABLET | Freq: Every day | ORAL | 0 refills | Status: DC
  Filled 2021-01-05: qty 90, 90d supply, fill #0

## 2021-01-05 NOTE — Telephone Encounter (Signed)
Contacted patient and discussed the results and recommendations for patients labs. She had already stopped her exercising on 01/01/2021. She will have her repeat labs drawn 01/19/2021. The patient requested to have her labs drawn at [facility].

## 2021-01-05 NOTE — Telephone Encounter (Signed)
Opened in error see encounter from 01/02/2021

## 2021-01-14 ENCOUNTER — Other Ambulatory Visit (HOSPITAL_BASED_OUTPATIENT_CLINIC_OR_DEPARTMENT_OTHER): Payer: Self-pay

## 2021-01-14 ENCOUNTER — Telehealth: Payer: Self-pay | Admitting: Sports Medicine

## 2021-01-14 MED ORDER — HYDROCODONE-ACETAMINOPHEN 5-325 MG PO TABS
1.0000 | ORAL_TABLET | Freq: Three times a day (TID) | ORAL | 0 refills | Status: DC | PRN
  Filled 2021-01-14: qty 15, 5d supply, fill #0

## 2021-01-14 MED FILL — Montelukast Sodium Tab 10 MG (Base Equiv): ORAL | 90 days supply | Qty: 90 | Fill #2 | Status: AC

## 2021-01-14 MED FILL — Baclofen Tab 10 MG: ORAL | 90 days supply | Qty: 90 | Fill #2 | Status: CN

## 2021-01-14 MED FILL — Finasteride Tab 5 MG: ORAL | 90 days supply | Qty: 45 | Fill #2 | Status: AC

## 2021-01-14 NOTE — Telephone Encounter (Signed)
April Webster is having worsening hip pain after having to fix a flat tire, adding some hydrocodone.

## 2021-01-15 ENCOUNTER — Other Ambulatory Visit: Payer: Self-pay | Admitting: *Deleted

## 2021-01-15 ENCOUNTER — Other Ambulatory Visit (HOSPITAL_BASED_OUTPATIENT_CLINIC_OR_DEPARTMENT_OTHER): Payer: Self-pay

## 2021-01-15 DIAGNOSIS — E785 Hyperlipidemia, unspecified: Secondary | ICD-10-CM

## 2021-01-15 DIAGNOSIS — R7401 Elevation of levels of liver transaminase levels: Secondary | ICD-10-CM

## 2021-01-16 ENCOUNTER — Other Ambulatory Visit (HOSPITAL_BASED_OUTPATIENT_CLINIC_OR_DEPARTMENT_OTHER): Payer: Self-pay

## 2021-01-16 LAB — LIPID PANEL
Cholesterol: 239 mg/dL — ABNORMAL HIGH (ref <200)
HDL: 64 mg/dL (ref >=50)
LDL Cholesterol (Calc): 160 mg/dL (calc) — ABNORMAL HIGH
Non-HDL Cholesterol (Calc): 175 mg/dL (calc) — ABNORMAL HIGH (ref <130)
Total CHOL/HDL Ratio: 3.7 (calc) (ref <5.0)
Triglycerides: 55 mg/dL (ref <150)

## 2021-01-16 LAB — HEPATIC FUNCTION PANEL
AG Ratio: 2 (calc) (ref 1.0–2.5)
ALT: 99 U/L — ABNORMAL HIGH (ref 6–29)
AST: 51 U/L — ABNORMAL HIGH (ref 10–30)
Albumin: 4.5 g/dL (ref 3.6–5.1)
Alkaline phosphatase (APISO): 74 U/L (ref 31–125)
Bilirubin, Direct: 0 mg/dL (ref 0.0–0.2)
Globulin: 2.3 g/dL (calc) (ref 1.9–3.7)
Indirect Bilirubin: 0.4 mg/dL (calc) (ref 0.2–1.2)
Total Bilirubin: 0.4 mg/dL (ref 0.2–1.2)
Total Protein: 6.8 g/dL (ref 6.1–8.1)

## 2021-01-16 MED FILL — Baclofen Tab 10 MG: ORAL | 90 days supply | Qty: 90 | Fill #2 | Status: AC

## 2021-01-16 NOTE — Progress Notes (Signed)
Yep, gonna need to restart that statin!!  Hey, liver does look a little better!

## 2021-01-20 ENCOUNTER — Other Ambulatory Visit: Payer: Self-pay | Admitting: General Surgery

## 2021-01-20 ENCOUNTER — Telehealth: Payer: Self-pay | Admitting: General Surgery

## 2021-01-20 DIAGNOSIS — R7401 Elevation of levels of liver transaminase levels: Secondary | ICD-10-CM

## 2021-01-20 NOTE — Telephone Encounter (Signed)
-----   Message from [place], DO sent at 01/20/2021 10:39 AM EST ----- Just wanted to make sure we have a hepatic function panel entered for April Webster for 2 weeks or so from now. Thanks!

## 2021-02-03 ENCOUNTER — Other Ambulatory Visit: Payer: Self-pay

## 2021-02-03 DIAGNOSIS — R7401 Elevation of levels of liver transaminase levels: Secondary | ICD-10-CM

## 2021-02-04 LAB — HEPATIC FUNCTION PANEL
AG Ratio: 2 (calc) (ref 1.0–2.5)
ALT: 95 U/L — ABNORMAL HIGH (ref 6–29)
AST: 47 U/L — ABNORMAL HIGH (ref 10–30)
Albumin: 4.5 g/dL (ref 3.6–5.1)
Alkaline phosphatase (APISO): 71 U/L (ref 31–125)
Bilirubin, Direct: 0.1 mg/dL (ref 0.0–0.2)
Globulin: 2.2 g/dL (calc) (ref 1.9–3.7)
Indirect Bilirubin: 0.4 mg/dL (calc) (ref 0.2–1.2)
Total Bilirubin: 0.5 mg/dL (ref 0.2–1.2)
Total Protein: 6.7 g/dL (ref 6.1–8.1)

## 2021-02-09 ENCOUNTER — Encounter: Payer: Self-pay | Admitting: Gastroenterology

## 2021-02-09 DIAGNOSIS — R7401 Elevation of levels of liver transaminase levels: Secondary | ICD-10-CM

## 2021-02-09 NOTE — Telephone Encounter (Signed)
Liver enzymes only came down slightly with another 2 weeks of rest/no exercise.  Yes, the statin may interfere, but so too could flu infection.  I think it is okay to go back to exercise and plan for repeat liver enzymes in another 4-6 weeks or so.  If these are still elevated, I really think liver biopsy is the appropriate next step.  Thanks.

## 2021-02-10 NOTE — Telephone Encounter (Signed)
Lab order and reminder in epic.  

## 2021-02-16 ENCOUNTER — Other Ambulatory Visit (HOSPITAL_BASED_OUTPATIENT_CLINIC_OR_DEPARTMENT_OTHER): Payer: Self-pay

## 2021-02-17 ENCOUNTER — Other Ambulatory Visit: Payer: Self-pay | Admitting: Radiology

## 2021-02-19 ENCOUNTER — Ambulatory Visit (HOSPITAL_COMMUNITY): Payer: No Typology Code available for payment source

## 2021-02-19 ENCOUNTER — Ambulatory Visit: Payer: No Typology Code available for payment source | Admitting: Family Medicine

## 2021-02-19 ENCOUNTER — Other Ambulatory Visit: Payer: Self-pay

## 2021-02-19 ENCOUNTER — Ambulatory Visit (HOSPITAL_COMMUNITY)
Admission: RE | Admit: 2021-02-19 | Discharge: 2021-02-19 | Disposition: A | Discharge status: Home / Self Care | Payer: No Typology Code available for payment source | Source: Ambulatory Visit | Attending: Gastroenterology | Admitting: Gastroenterology

## 2021-02-19 DIAGNOSIS — R7401 Elevation of levels of liver transaminase levels: Secondary | ICD-10-CM | POA: Insufficient documentation

## 2021-02-19 LAB — CBC
HCT: 38.7 % (ref 36.0–46.0)
Hemoglobin: 13.4 g/dL (ref 12.0–15.0)
MCH: 33.5 pg (ref 26.0–34.0)
MCHC: 34.6 g/dL (ref 30.0–36.0)
MCV: 96.8 fL (ref 80.0–100.0)
Platelets: 203 10*3/uL (ref 150–400)
RBC: 4 MIL/uL (ref 3.87–5.11)
RDW: 12.9 % (ref 11.5–15.5)
WBC: 3.6 10*3/uL — ABNORMAL LOW (ref 4.0–10.5)
nRBC: 0 % (ref 0.0–0.2)

## 2021-02-19 LAB — PROTIME-INR
INR: 1 (ref 0.8–1.2)
Prothrombin Time: 13.4 seconds (ref 11.4–15.2)

## 2021-02-19 IMAGING — US US BIOPSY CORE LIVER
1 series · 13 of 15 positions shown · non-contrast
Comparison: none

INDICATION: Elevated LFTs and positive DEVIAN. Random biopsy requested to evaluate
for autoimmune hepatitis.

[Series 1: us biopsy (liver) · 13 of 15 slices shown]
[im 1/15]
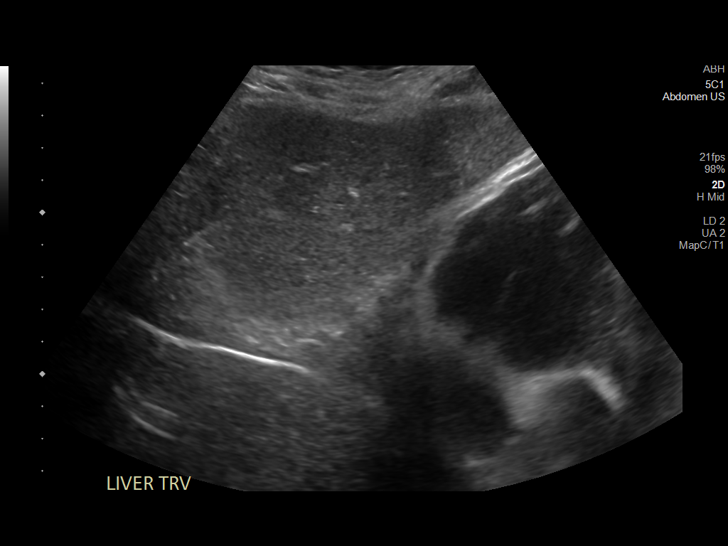
[im 2/15]
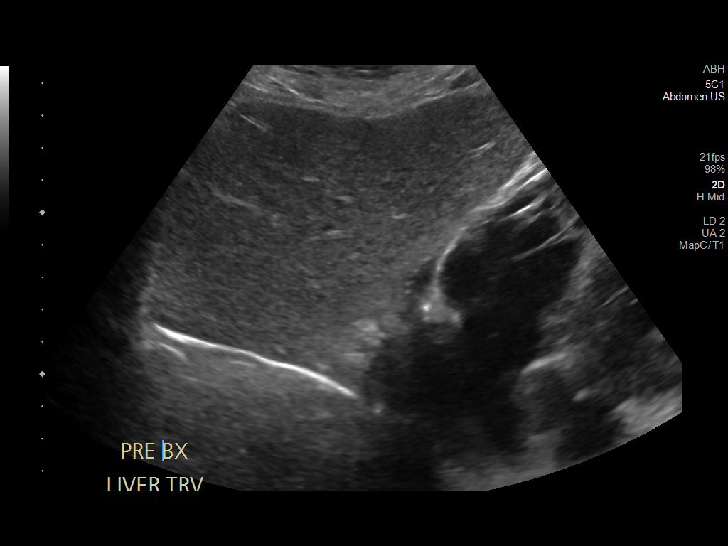
[im 3/15]
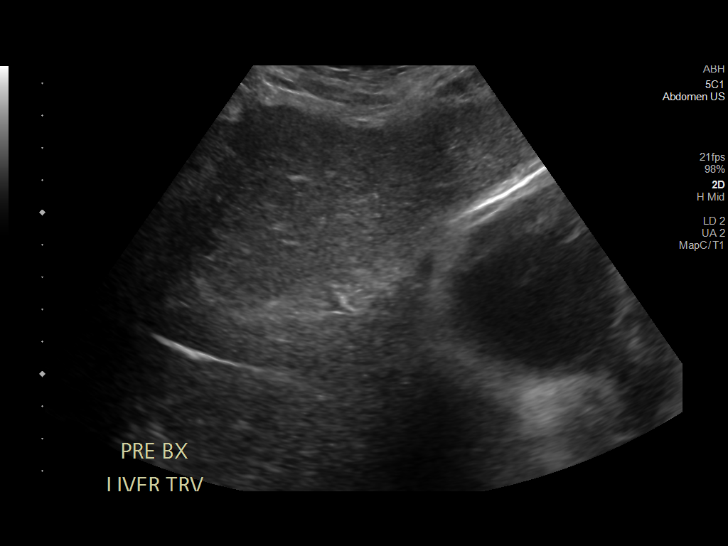
[im 5/15]
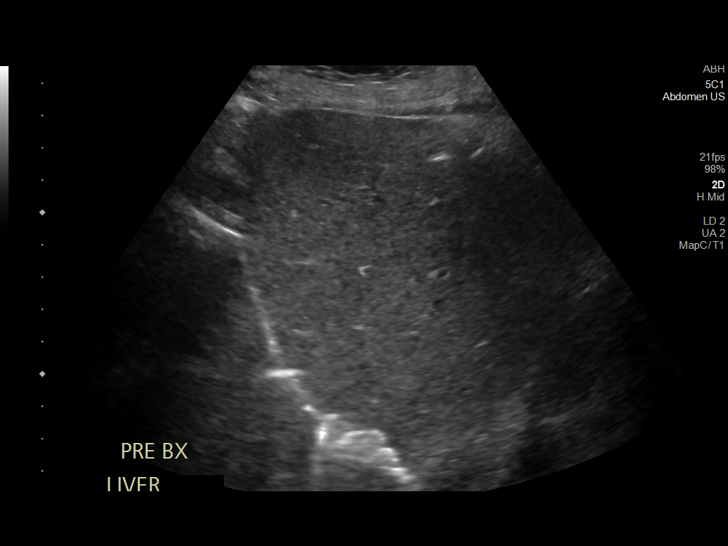
[im 6/15]
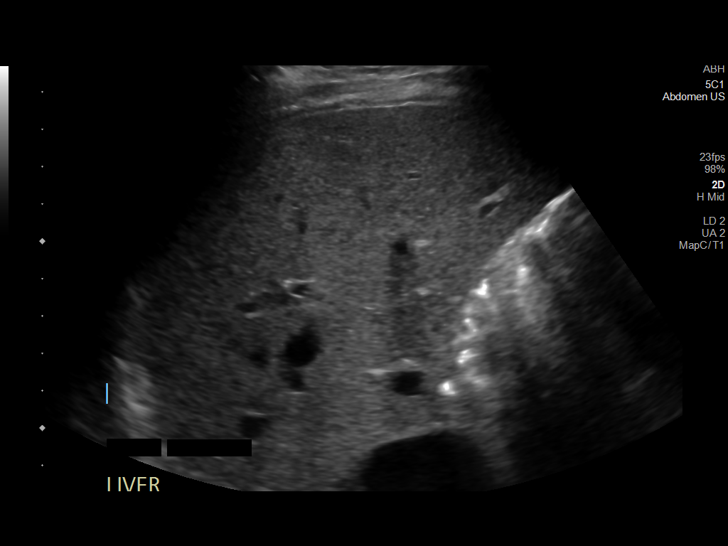
[im 7/15]
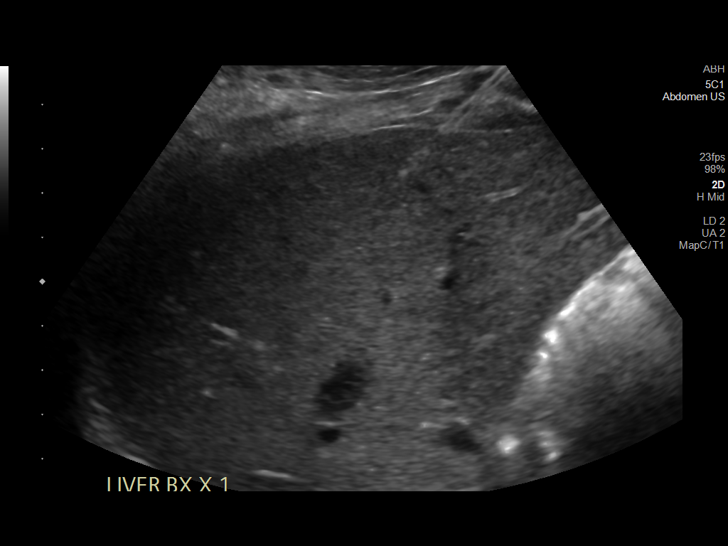
[im 8/15]
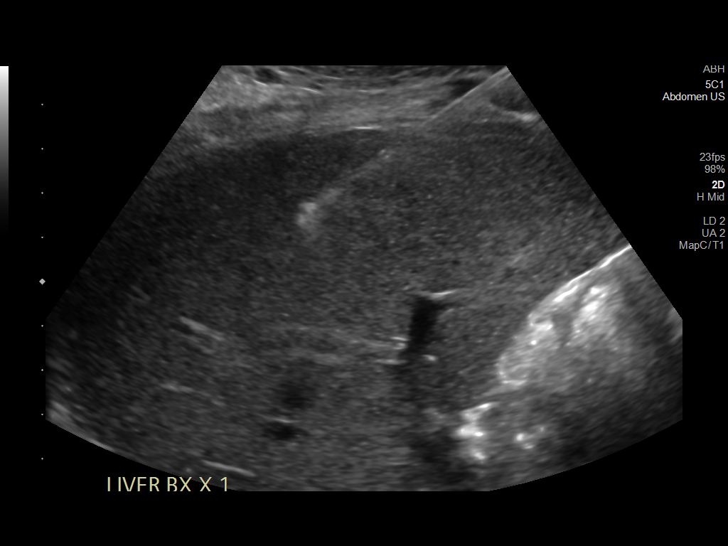
[im 9/15]
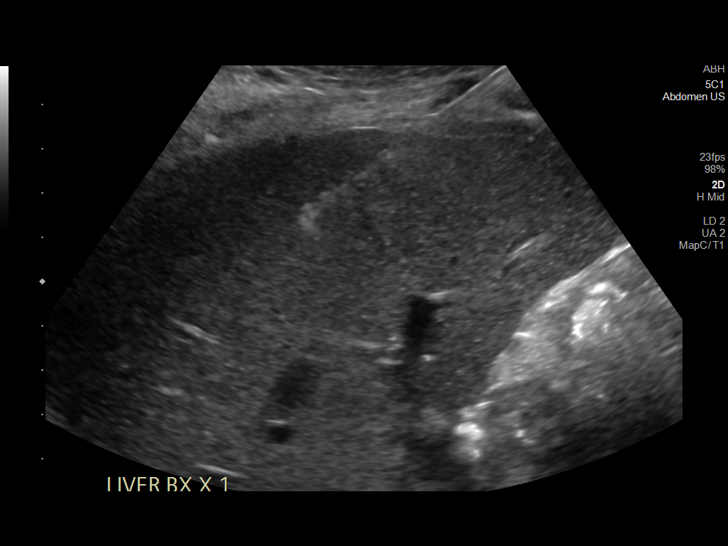
[im 10/15]
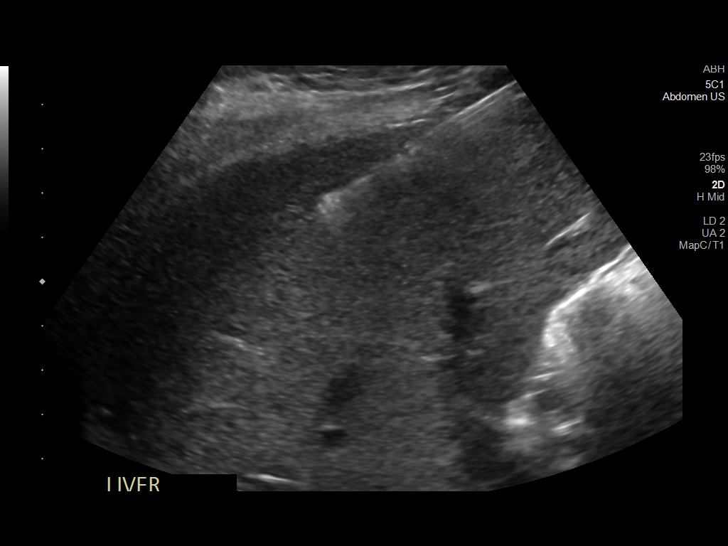
[im 11/15]
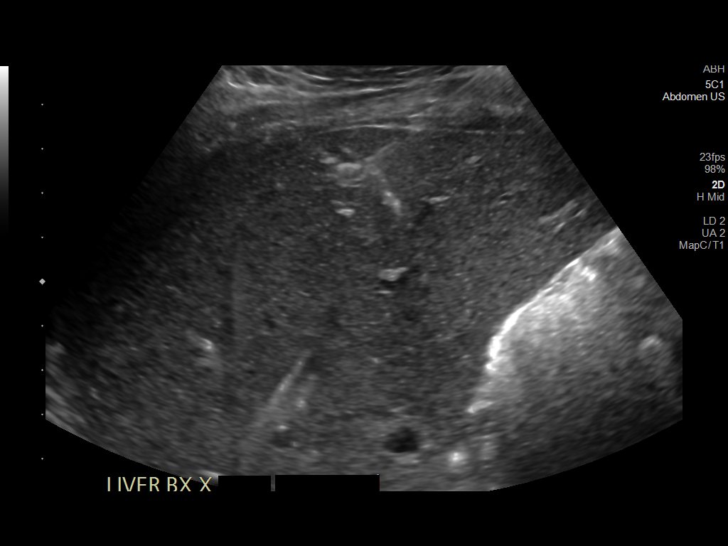
[im 13/15]
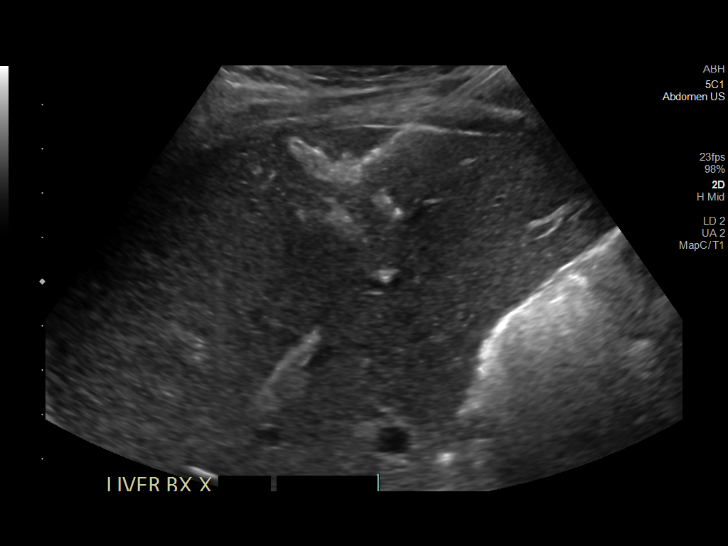
[im 14/15]
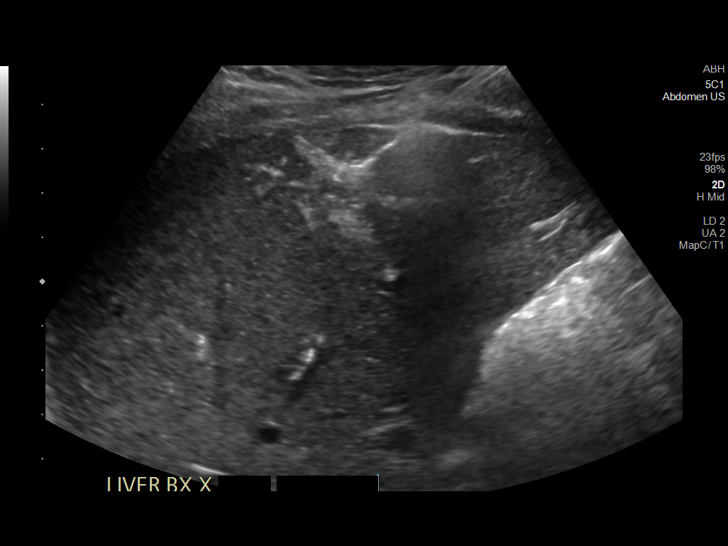
[im 15/15]
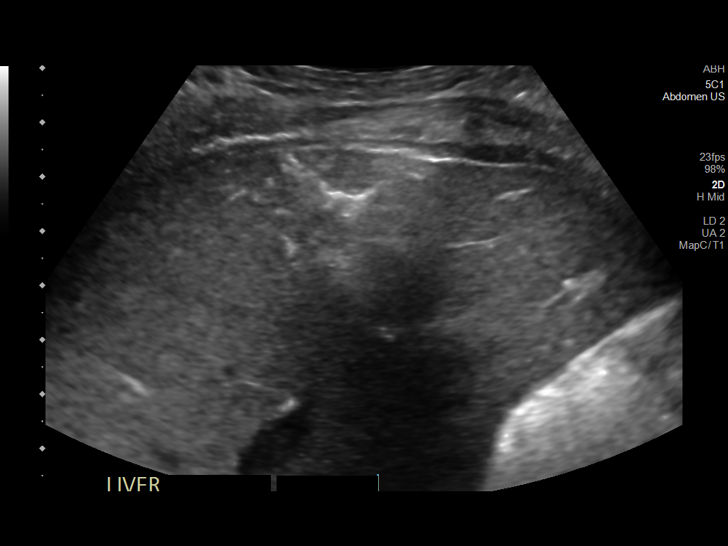

[13 of 15 positions shown; findings below may reference images not displayed]

EXAM:
Ultrasound-guided core biopsy of the liver

MEDICATIONS:
None.

ANESTHESIA/SEDATION:
Fentanyl 75 mcg IV; Versed 1 mg IV

Moderate Sedation Time:  10 minutes

The patient was continuously monitored during the procedure by the
interventional radiology nurse under my direct supervision.

FLUOROSCOPY TIME:  None.

COMPLICATIONS:
None immediate.

PROCEDURE:
Informed consent was obtained from the patient following explanation
of the procedure, risks, benefits and alternatives. The patient
understands, agrees and consents for the procedure. All questions
were addressed. A time out was performed.

The right upper quadrant was interrogated with ultrasound. A
relatively avascular plane of the liver was identified. A suitable
skin entry site was selected and marked. The region was then
sterilely prepped and draped in standard fashion using chlorhexidine
skin prep. Local anesthesia was attained by infiltration with 1%
lidocaine. A small dermatotomy was made.

Under real-time sonographic guidance, a 17 gauge trocar needle was
advanced into the liver. Multiple 18 gauge core biopsies were then
coaxially obtained. Needle placement was confirmed on all biopsy
passes with real-time sonography. Biopsy specimens were placed in
formalin and delivered to pathology for further analysis.

As the introducer needle was removed, the biopsy tract was embolized
with a Gel-Foam slurry. Post biopsy ultrasound imaging demonstrates
no active bleeding or perihepatic hematoma. The patient tolerated
the procedure well.
IMPRESSION: Technically successful ultrasound-guided random core biopsy of the
liver.

## 2021-02-19 MED ORDER — HYDROMORPHONE HCL 1 MG/ML IJ SOLN
INTRAMUSCULAR | Status: AC | PRN
  Administered 2021-02-19: 1 mg via INTRAVENOUS

## 2021-02-19 MED ORDER — HYDROMORPHONE HCL 1 MG/ML IJ SOLN
INTRAMUSCULAR | Status: AC
  Filled 2021-02-19: qty 1

## 2021-02-19 MED ORDER — LIDOCAINE HCL (PF) 1 % IJ SOLN
INTRAMUSCULAR | Status: AC
  Filled 2021-02-19: qty 30

## 2021-02-19 MED ORDER — GELATIN ABSORBABLE 12-7 MM EX MISC
CUTANEOUS | Status: AC
  Filled 2021-02-19: qty 1

## 2021-02-19 MED ORDER — FENTANYL CITRATE (PF) 100 MCG/2ML IJ SOLN
INTRAMUSCULAR | Status: AC
  Filled 2021-02-19: qty 2

## 2021-02-19 MED ORDER — MIDAZOLAM HCL 2 MG/2ML IJ SOLN
INTRAMUSCULAR | Status: AC
  Filled 2021-02-19: qty 2

## 2021-02-19 MED ORDER — MIDAZOLAM HCL 2 MG/2ML IJ SOLN
INTRAMUSCULAR | Status: AC | PRN
  Administered 2021-02-19: 1 mg via INTRAVENOUS

## 2021-02-19 MED ORDER — SODIUM CHLORIDE 0.9 % IV SOLN: INTRAVENOUS | Status: DC

## 2021-02-19 MED ORDER — FENTANYL CITRATE (PF) 100 MCG/2ML IJ SOLN
INTRAMUSCULAR | Status: AC | PRN
  Administered 2021-02-19: 50 ug via INTRAVENOUS
  Administered 2021-02-19: 25 ug via INTRAVENOUS

## 2021-02-19 NOTE — Procedures (Signed)
Interventional Radiology Procedure Note  Procedure: US guided random liver biopsy  Complications: None immediate.  Patient did have some post procedural pain.  Initial management with dilaudid.   Estimated Blood Loss: None  Recommendations: - Bedrest right side down x 2 hrs - Manage pain   Signed,  Criselda Peaches, MD

## 2021-02-19 NOTE — Consult Note (Signed)
Chief Complaint: Patient was seen in consultation today for image guided random core liver biopsy  Referring Physician(s): Natrona V  Supervising Physician: Jacqulynn Cadet  Patient Status: [facility] - Out-pt  History of Present Illness: April Webster is a 45 y.o. female ,former smoker ,with PMH sig for chronic idiopathic constipation,DDD, Graves dz, HLD, HTN, prior cholecystectomy, lichen sclerosus and Raynaud's dz. She has newly noted elevated LFT's since 10/2020 along with positive ANA. Abd [place] was neg in 11/2020. She presents today for image guided random core liver bx for further evaluation.   Past Medical History:  Diagnosis Date   Chronic idiopathic constipation    DDD (degenerative disc disease), cervical and lumbar    Female pattern hair loss    Graves disease    Hyperlipidemia    Hypertension    Lichen sclerosus    Raynaud's disease     Past Surgical History:  Procedure Laterality Date   ABDOMINOPLASTY     AUGMENTATION MAMMAPLASTY Bilateral 41/6384   Silicone    PLACEMENT OF BREAST IMPLANTS     THYROID SURGERY      Allergies: Cefuroxime axetil, Tuberculin purified protein derivative, Ambien [zolpidem], and Cymbalta [duloxetine hcl]  Medications: Prior to Admission medications   Medication Sig Start Date End Date Taking? Authorizing Provider  amLODipine (NORVASC) 2.5 MG tablet TAKE 1 TABLET (2.5 MG TOTAL) BY MOUTH DAILY. 10/23/20 10/23/21 Yes Hali Marry, MD  ASHWAGANDHA PO Take 2 each by mouth at bedtime. Gummies   Yes [provider]  atorvastatin (LIPITOR) 20 MG tablet TAKE 1 TABLET (20 MG TOTAL) BY MOUTH IN THE MORNING. 05/06/20 05/06/21 Yes Hali Marry, MD  baclofen (LIORESAL) 10 MG tablet TAKE 1 TABLET (10 MG TOTAL) BY MOUTH AT BEDTIME. 05/16/20 05/16/21 Yes Silverio Decamp, MD  clobetasol ointment (TEMOVATE) 5.36 % Apply 1 application topically daily as needed for rash. LIchen sclerosis   Yes [provider]   FIBER SELECT GUMMIES PO Take 2 each by mouth daily.   Yes [provider]  finasteride (PROSCAR) 5 MG tablet TAKE 1/2 TABLET (2.5 MG TOTAL) BY MOUTH DAILY. 05/06/20 05/06/21 Yes Hali Marry, MD  FLUoxetine (PROZAC) 10 MG capsule Take 1 capsule (10 mg total) by mouth daily. 11/28/20 11/28/21 Yes Hali Marry, MD  HYDROcodone-acetaminophen (NORCO/VICODIN) 5-325 MG tablet Take 1 tablet by mouth every 8 (eight) hours as needed for moderate pain. 01/14/21  Yes Silverio Decamp, MD  levonorgestrel (MIRENA) 20 MCG/24HR IUD by Intrauterine route.   Yes [provider]  linaclotide (LINZESS) 145 MCG CAPS capsule Take 145 mcg by mouth every other day. Alternating with 290 every other day   Yes [provider]  linaclotide (LINZESS) 290 MCG CAPS capsule Take 1 capsule (290 mcg total) by mouth daily before breakfast. Patient taking differently: Take 290 mcg by mouth every other day. Alternating with 145 mcg every other day 12/23/20  Yes Cirigliano, Vito V, DO  melatonin 5 MG TABS Take 5 mg by mouth at bedtime.   Yes [provider]  meloxicam (MOBIC) 15 MG tablet TAKE 1 TABLET (15 MG TOTAL) BY MOUTH DAILY. 10/23/20 10/23/21 Yes Hali Marry, MD  montelukast (SINGULAIR) 10 MG tablet TAKE 1 TABLET (10 MG TOTAL) BY MOUTH AT BEDTIME. 05/06/20 05/06/21 Yes Hali Marry, MD  pantoprazole (PROTONIX) 20 MG tablet TAKE 1 TABLET (20 MG TOTAL) BY MOUTH DAILY. 11/21/20 11/21/21 Yes Hali Marry, MD  pregabalin (LYRICA) 75 MG capsule Take 1 capsule (75  mg total) by mouth 2 (two) times daily. 09/10/20  Yes Hali Marry, MD  SYNTHROID 100 MCG tablet Take 1 tablet (100 mcg total) by mouth daily before breakfast. 01/05/21  Yes Hali Marry, MD  amitriptyline (ELAVIL) 25 MG tablet Take 1-2 tablets (25-50 mg total) by mouth at bedtime. Patient not taking: Reported on 02/13/2021 06/20/20   Hali Marry, MD  levocetirizine (XYZAL) 5  MG tablet Take 1 tablet (5 mg total) by mouth every evening. Patient not taking: Reported on 02/13/2021 07/04/20   Silverio Decamp, MD     Family History  Problem Relation Age of Onset   Hypertension Mother    Hyperlipidemia Mother    Osteoporosis Mother    Lung cancer Father    Heart attack Maternal Grandmother    Stomach cancer Maternal Grandfather    Colon cancer Neg Hx    Rectal cancer Neg Hx    Liver cancer Neg Hx    Esophageal cancer Neg Hx    Pancreatic cancer Neg Hx     Social History   Socioeconomic History   Marital status: Married    Spouse name: Not on file   Number of children: 3   Years of education: Not on file   Highest education level: Not on file  Occupational History   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  Other Topics Concern   Not on file  Social History Narrative   Not on file   Social Determinants of Health   Financial Resource Strain: Not on file  Food Insecurity: Not on file  Transportation Needs: Not on file  Physical Activity: Not on file  Stress: Not on file  Social Connections: Not on file      Review of Systems denies fever,HA,CP, dyspnea, cough, abd pain/back pain,NV or abnormal bleeding.   Vital Signs: BP 118/84   Pulse 74   Temp 98.3 F (36.8 C) (Oral)   Resp 18   Ht 5' 3"  (1.6 m)   Wt 126 lb (57.2 kg)   SpO2 99%   BMI 22.32 kg/m   Physical Exam awake/alert; chest- CTA bilat; heart- RRR; abd- soft,+BS,NT; no LE edema  Imaging: No results  found.  Labs:  CBC: Recent Labs    03/10/20 0740 07/09/20 1501 10/31/20 0000 11/11/20 0000  WBC 4.4 6.3 5.5 4.1  HGB 13.7 14.4 13.7 13.6  HCT 40.4 42.9 42.2 40.3  PLT 273 305 245 281    COAGS: Recent Labs    11/11/20 0000  INR 1.0    BMP: Recent Labs    03/10/20 0740 07/09/20 1501 10/31/20 0000  NA 136 139 139  K 4.0 3.8 4.2  CL 103 99 103  CO2 22 31 27   GLUCOSE 91 150* 109*  BUN 14 17 23   CALCIUM 9.0 10.2 9.9  CREATININE 0.63 0.75 0.66  GFRNONAA 110  --   --  GFRAA 127  --   --     LIVER FUNCTION TESTS: Recent Labs    12/04/20 0000 01/01/21 0000 01/15/21 0000 02/03/21 0000  BILITOT 0.4 0.4 0.4 0.5  AST 75* 89* 51* 47*  ALT 101* 155* 99* 95*  PROT 6.9 6.6 6.8 6.7    TUMOR MARKERS: No results for input(s): AFPTM, CEA, CA199, CHROMGRNA in the last 8760 hours.  Assessment and Plan: 45 y.o. female ,former smoker ,with PMH sig for chronic idiopathic constipation,DDD, Graves dz, HLD, HTN, prior cholecystectomy, lichen sclerosus and Raynaud's dz. She has newly noted elevated LFT's since 10/2020 along with positive ANA. Abd [place] was neg in 11/2020. She presents today for image guided random core liver bx for further evaluation.Risks and benefits of procedure was discussed with the patient including, but not limited to bleeding, infection, damage to adjacent structures or low yield requiring additional tests.  All of the questions were answered and there is agreement to proceed.  Consent signed and in chart.    Thank you for this interesting consult.  I greatly enjoyed meeting April Webster and look forward to participating in their care.  A copy of this report was sent to the requesting provider on this date.  Electronically Signed: D. Rowe Robert, PA-C 02/19/2021, 11:31 AM   I spent a total of   25 minutes  in face to face in clinical consultation, greater than 50% of which was counseling/coordinating care for image guided random core liver biopsy

## 2021-02-23 LAB — SURGICAL PATHOLOGY

## 2021-02-26 ENCOUNTER — Encounter: Payer: Self-pay | Admitting: Family Medicine

## 2021-02-26 ENCOUNTER — Other Ambulatory Visit: Payer: Self-pay

## 2021-02-26 ENCOUNTER — Ambulatory Visit (INDEPENDENT_AMBULATORY_CARE_PROVIDER_SITE_OTHER): Payer: No Typology Code available for payment source | Admitting: Family Medicine

## 2021-02-26 ENCOUNTER — Other Ambulatory Visit (HOSPITAL_COMMUNITY): Payer: Self-pay

## 2021-02-26 VITALS — BP 114/70 | HR 66 | Ht 63.0 in | Wt 125.2 lb

## 2021-02-26 DIAGNOSIS — I10 Essential (primary) hypertension: Secondary | ICD-10-CM | POA: Diagnosis not present

## 2021-02-26 DIAGNOSIS — T887XXA Unspecified adverse effect of drug or medicament, initial encounter: Secondary | ICD-10-CM | POA: Diagnosis not present

## 2021-02-26 DIAGNOSIS — M542 Cervicalgia: Secondary | ICD-10-CM | POA: Diagnosis not present

## 2021-02-26 DIAGNOSIS — I7300 Raynaud's syndrome without gangrene: Secondary | ICD-10-CM | POA: Diagnosis not present

## 2021-02-26 MED ORDER — PREGABALIN 100 MG PO CAPS
100.0000 mg | ORAL_CAPSULE | Freq: Two times a day (BID) | ORAL | 0 refills | Status: DC
  Filled 2021-02-26: qty 60, 30d supply, fill #0

## 2021-02-26 MED ORDER — NIFEDIPINE ER OSMOTIC RELEASE 30 MG PO TB24
30.0000 mg | ORAL_TABLET | Freq: Every day | ORAL | 0 refills | Status: DC
  Filled 2021-02-26: qty 30, 30d supply, fill #0

## 2021-02-26 NOTE — Assessment & Plan Note (Addendum)
Mild improvement on amlodipine but can't go up  On her dose bc of BP. Had sexual side effects on fluoxetine so will taper off.   Per rec of UTD should stick with a Dihydropyridine.  Will changed to nifedipine. Will start with lowest dose. Trial for 30 min

## 2021-02-26 NOTE — Progress Notes (Addendum)
Established Patient Office Visit  Subjective:  Patient ID: April Webster, female    DOB: 1975-07-11  Age: 45 y.o. MRN: [id number]  CC:  Chief Complaint  Patient presents with   Follow-up         HPI April Webster presents for Neck pain  - pain 6/10.  She felt like the Lyrica was helping quite a bit for a while and when she was exercising regularly is seem to be helpful as well.  But it just has gradually begin getting worse again.  Pain is now more at the base of her skull, and feels like if she could just get something pushed up underneath the edge of the occiput she would get some relief.  It feels more like it is in the soft tissue.  She had an MRI done in March 2022 showing right paracentral disc osteophyte complex at C4-5 with mild right C5 foraminal stenosis.  Also some mild noncompressive disc bulge at C2-3, C3-4, C5-6.  No significant impingement in those areas.    Raynauds  -she did try the fluoxetine and found that it was helpful, but unfortunately caused sexual side effects.  Were unable to go up on her amlodipine because her blood pressure tends to drop below.  Her symptoms are persistent and daily.  Any slight temperature change will trigger it.    Past Medical History:  Diagnosis Date   Chronic idiopathic constipation    DDD (degenerative disc disease), cervical and lumbar    Female pattern hair loss    Graves disease    Hyperlipidemia    Hypertension    Lichen sclerosus    Raynaud's disease     Past Surgical History:  Procedure Laterality Date   ABDOMINOPLASTY     AUGMENTATION MAMMAPLASTY Bilateral 38/1017   Silicone    PLACEMENT OF BREAST IMPLANTS     THYROID SURGERY      Family History  Problem Relation Age of Onset   Hypertension Mother    Hyperlipidemia Mother    Osteoporosis Mother    Lung cancer Father    Heart attack Maternal Grandmother    Stomach cancer Maternal Grandfather    Colon cancer Neg Hx    Rectal cancer Neg Hx    Liver cancer Neg Hx     Esophageal cancer Neg Hx    Pancreatic cancer Neg Hx     Social History   Socioeconomic History   Marital status: Married    Spouse name: Not on file   Number of children: 3   Years of education: Not on file   Highest education level: Not on file  Occupational History   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  Other Topics Concern   Not on file  Social History Narrative   Not on file   Social Determinants of Health   Financial Resource Strain: Not on file  Food Insecurity: Not on file  Transportation Needs: Not on file  Physical Activity: Not on file  Stress: Not on file  Social Connections: Not on file  Intimate Partner Violence: Not on file    Outpatient Medications Prior to Visit  Medication Sig Dispense Refill   ASHWAGANDHA PO Take 2 each by mouth at bedtime. Gummies     atorvastatin (LIPITOR) 20 MG tablet TAKE 1 TABLET (20 MG TOTAL) BY MOUTH IN THE MORNING. 90 tablet 3   baclofen (LIORESAL) 10 MG tablet TAKE 1 TABLET (10 MG TOTAL) BY MOUTH AT BEDTIME. 90 tablet 3   clobetasol ointment (TEMOVATE) 0.94 % Apply 1 application topically daily as needed for rash. LIchen sclerosis     FIBER SELECT GUMMIES PO Take 2 each by mouth daily.     finasteride (PROSCAR) 5 MG tablet TAKE 1/2 TABLET (2.5 MG TOTAL) BY MOUTH DAILY. 45 tablet 3   levonorgestrel (MIRENA) 20 MCG/24HR IUD by Intrauterine route.     linaclotide (LINZESS) 145 MCG CAPS capsule Take 145 mcg by mouth every other day. Alternating with 290 every other day      linaclotide (LINZESS) 290 MCG CAPS capsule Take 1 capsule (290 mcg total) by mouth daily before breakfast. (Patient taking differently: Take 290 mcg by mouth every other day. Alternating with 145 mcg every other day) 30 capsule 3   melatonin 5 MG TABS Take 5 mg by mouth at bedtime.     meloxicam (MOBIC) 15 MG tablet TAKE 1 TABLET (15 MG TOTAL) BY MOUTH DAILY. 90 tablet 1   montelukast (SINGULAIR) 10 MG tablet TAKE 1 TABLET (10 MG TOTAL) BY MOUTH AT BEDTIME. 90 tablet 3   pantoprazole (PROTONIX) 20 MG tablet TAKE 1 TABLET (20 MG TOTAL) BY MOUTH DAILY. 90 tablet 3   SYNTHROID 100 MCG tablet Take 1 tablet (100 mcg total) by mouth daily before breakfast. 90 tablet 0   amitriptyline (ELAVIL) 25 MG tablet Take 1-2 tablets (25-50 mg total) by mouth at bedtime. (Patient not taking: Reported on 02/13/2021) 60 tablet 1   amLODipine (NORVASC) 2.5 MG tablet TAKE 1 TABLET (2.5 MG TOTAL) BY MOUTH DAILY. 90 tablet 1   FLUoxetine (PROZAC) 10 MG capsule Take 1 capsule (10 mg total) by mouth daily. 90 capsule 0   HYDROcodone-acetaminophen (NORCO/VICODIN) 5-325 MG tablet Take 1 tablet by mouth every 8 (eight) hours as needed for moderate pain. 15 tablet 0   levocetirizine (XYZAL) 5 MG tablet Take 1 tablet (5 mg total) by mouth every evening. (Patient not taking: Reported on 02/13/2021) 90 tablet 3   pregabalin (LYRICA) 75 MG capsule Take 1 capsule (75 mg total) by mouth 2 (two) times daily. 180 capsule 1   No facility-administered medications prior to visit.    Allergies  Allergen Reactions   Cefuroxime Axetil Rash   Tuberculin Purified Protein Derivative Rash    Erythema and itching without induration   Ambien [Zolpidem] Other (See Comments)    Sleep talking   Cymbalta [Duloxetine Hcl] Other (See Comments)    Urinary retention.    Fluoxetine Other (See Comments)    Sexual dysfunction    ROS Review of Systems    Objective:    Physical Exam Constitutional:      Appearance: Normal appearance. She  is well-developed.  HENT:     Head: Normocephalic and atraumatic.  Cardiovascular:     Rate and Rhythm: Normal rate and regular rhythm.     Heart sounds: Normal heart sounds.  Pulmonary:  Effort: Pulmonary effort is normal.     Breath sounds: Normal breath sounds.  Skin:    General: Skin is warm and dry.  Neurological:     Mental Status: She is alert and oriented to person, place, and time.  Psychiatric:        Behavior: Behavior normal.    BP 114/70    Pulse 66    Ht 5' 3"  (1.6 m)    Wt 125 lb 3.2 oz (56.8 kg)    SpO2 99%    BMI 22.18 kg/m  Wt Readings from Last 3 Encounters:  02/26/21 125 lb 3.2 oz (56.8 kg)  02/19/21 126 lb (57.2 kg)  12/23/20 131 lb (59.4 kg)     Health Maintenance Due  Topic Date Due   Pneumococcal Vaccine 71-8 Years old (1 - PCV) Never done   HIV Screening  Never done   COVID-19 Vaccine (3 - Booster for Pfizer series) 12/28/2019    There are no preventive care reminders to display for this patient.  Lab Results  Component Value Date   TSH 2.16 10/31/2020   Lab Results  Component Value Date   WBC 3.6 (L) 02/19/2021   HGB 13.4 02/19/2021   HCT 38.7 02/19/2021   MCV 96.8 02/19/2021   PLT 203 02/19/2021   Lab Results  Component Value Date   NA 139 10/31/2020   K 4.2 10/31/2020   CO2 27 10/31/2020   GLUCOSE 109 (H) 10/31/2020   BUN 23 10/31/2020   CREATININE 0.66 10/31/2020   BILITOT 0.5 02/03/2021   AST 47 (H) 02/03/2021   ALT 95 (H) 02/03/2021   PROT 6.7 02/03/2021   CALCIUM 9.9 10/31/2020   EGFR 111 10/31/2020   Lab Results  Component Value Date   CHOL 239 (H) 01/15/2021   Lab Results  Component Value Date   HDL 64 01/15/2021   Lab Results  Component Value Date   LDLCALC 160 (H) 01/15/2021   Lab Results  Component Value Date   TRIG 55 01/15/2021   Lab Results  Component Value Date   CHOLHDL 3.7 01/15/2021   No results found for: HGBA1C    Assessment & Plan:   Problem List Items Addressed This Visit        Cardiovascular and Mediastinum   Raynaud's disease - Primary    Mild improvement on amlodipine but can't go up  On her dose bc of BP. Had sexual side effects on fluoxetine so will taper off.   Per rec of UTD should stick with a Dihydropyridine.  Will changed to nifedipine. Will start with lowest dose. Trial for 30 min      Relevant Medications   NIFEdipine (PROCARDIA-XL/NIFEDICAL-XL) 30 MG 24 hr tablet   Essential hypertension    BP looks phenomenal today.  We will taper off amlodipine and switch to nifedipine.      Relevant Medications   NIFEdipine (PROCARDIA-XL/NIFEDICAL-XL) 30 MG 24 hr tablet     Other   Cervical pain    Discussed options.  Could consider a retrial of massage therapy.  We will get some information.  We also discussed possibly increasing the Lyrica for 30 days just to see if that makes a difference as well.  She is starting to exercise again.  She had stopped temporarily to see if her liver enzymes would decrease without exercise.  Could consider injections again.  Do not think it was anything on the MRI from the spring that need to be addressed surgically at least  not at this point. Consider botox?      Relevant Medications   pregabalin (LYRICA) 100 MG capsule   Other Visit Diagnoses     Medication side effect          Fluoxetine added to intolerance list.  Meds ordered this encounter  Medications   pregabalin (LYRICA) 100 MG capsule    Sig: Take 1 capsule by mouth 2 times daily.    Dispense:  60 capsule    Refill:  0   NIFEdipine (PROCARDIA-XL/NIFEDICAL-XL) 30 MG 24 hr tablet    Sig: Take 1 tablet (30 mg total) by mouth daily.    Dispense:  30 tablet    Refill:  0    Follow-up: No follow-ups on file.    Beatrice Lecher, MD

## 2021-02-27 ENCOUNTER — Other Ambulatory Visit (HOSPITAL_COMMUNITY): Payer: Self-pay

## 2021-02-27 NOTE — Assessment & Plan Note (Addendum)
Discussed options.  Could consider a retrial of massage therapy.  We will get some information.  We also discussed possibly increasing the Lyrica for 30 days just to see if that makes a difference as well.  She is starting to exercise again.  She had stopped temporarily to see if her liver enzymes would decrease without exercise.  Could consider injections again.  Do not think it was anything on the MRI from the spring that need to be addressed surgically at least not at this point. Consider botox?

## 2021-02-27 NOTE — Addendum Note (Signed)
Addended by: Beatrice Lecher D on: 02/27/2021 01:07 PM   Modules accepted: Orders

## 2021-02-27 NOTE — Assessment & Plan Note (Signed)
BP looks phenomenal today.  We will taper off amlodipine and switch to nifedipine.

## 2021-03-05 ENCOUNTER — Encounter: Payer: Self-pay | Admitting: Family Medicine

## 2021-03-17 ENCOUNTER — Other Ambulatory Visit: Payer: Self-pay | Admitting: *Deleted

## 2021-03-17 ENCOUNTER — Telehealth: Payer: Self-pay

## 2021-03-17 DIAGNOSIS — R7401 Elevation of levels of liver transaminase levels: Secondary | ICD-10-CM

## 2021-03-17 NOTE — Telephone Encounter (Signed)
My chart message sent to patient with reminder.

## 2021-03-17 NOTE — Telephone Encounter (Signed)
-----   Message from Yevette Edwards, RN sent at 02/10/2021 10:35 AM EST ----- Regarding: Labs LFT's, the order is in epic. Pt will have drawn at her job in [place]

## 2021-03-17 NOTE — Telephone Encounter (Signed)
Pt reviewed my chart message.

## 2021-03-18 LAB — HEPATIC FUNCTION PANEL
AG Ratio: 2.1 (calc) (ref 1.0–2.5)
ALT: 130 U/L — ABNORMAL HIGH (ref 6–29)
AST: 75 U/L — ABNORMAL HIGH (ref 10–30)
Albumin: 5 g/dL (ref 3.6–5.1)
Alkaline phosphatase (APISO): 78 U/L (ref 31–125)
Bilirubin, Direct: 0.1 mg/dL (ref 0.0–0.2)
Globulin: 2.4 g/dL (calc) (ref 1.9–3.7)
Indirect Bilirubin: 0.2 mg/dL (calc) (ref 0.2–1.2)
Total Bilirubin: 0.3 mg/dL (ref 0.2–1.2)
Total Protein: 7.4 g/dL (ref 6.1–8.1)

## 2021-03-20 ENCOUNTER — Other Ambulatory Visit (HOSPITAL_BASED_OUTPATIENT_CLINIC_OR_DEPARTMENT_OTHER): Payer: Self-pay

## 2021-03-20 ENCOUNTER — Telehealth: Payer: Self-pay | Admitting: Family Medicine

## 2021-03-20 MED ORDER — PREDNISONE 20 MG PO TABS
40.0000 mg | ORAL_TABLET | Freq: Every day | ORAL | 0 refills | Status: DC
  Filled 2021-03-20: qty 10, 5d supply, fill #0

## 2021-03-20 NOTE — Telephone Encounter (Signed)
Neck is flaring again. Would like a burst of pred for treatment.

## 2021-03-29 ENCOUNTER — Other Ambulatory Visit: Payer: Self-pay | Admitting: Family Medicine

## 2021-03-29 DIAGNOSIS — M542 Cervicalgia: Secondary | ICD-10-CM

## 2021-03-31 ENCOUNTER — Other Ambulatory Visit (HOSPITAL_COMMUNITY): Payer: Self-pay

## 2021-03-31 MED ORDER — PREGABALIN 100 MG PO CAPS
100.0000 mg | ORAL_CAPSULE | Freq: Two times a day (BID) | ORAL | 1 refills | Status: DC
  Filled 2021-03-31: qty 180, 90d supply, fill #0
  Filled 2021-06-27: qty 180, 90d supply, fill #1

## 2021-03-31 MED ORDER — SYNTHROID 100 MCG PO TABS
100.0000 ug | ORAL_TABLET | Freq: Every day | ORAL | 0 refills | Status: DC
  Filled 2021-03-31: qty 90, 90d supply, fill #0

## 2021-04-07 ENCOUNTER — Encounter: Payer: Self-pay | Admitting: Gastroenterology

## 2021-04-08 NOTE — Progress Notes (Signed)
GYNECOLOGY OFFICE VISIT NOTE  History:   April Webster is a 46 y.o. [phone] here today for abdominal cramping such that she wished to have her IUD checked. She had her mirena placed in 2018.   She had the cramping after she was taking out a tampon and felt something unusual. She pulled on it forgetting it was her strings. She then had cramping for several days following this. She bleeds off and on with the mirena and maybe gets 3 weeks off of bleeding. It is still better than the period she had prior to having the IUD which were painful and extremely heavy with passage of clots.     Past Medical History:  Diagnosis Date   Chronic idiopathic constipation    DDD (degenerative disc disease), cervical and lumbar    Female pattern hair loss    Graves disease    Hyperlipidemia    Hypertension    Lichen sclerosus    Raynaud's disease     Past Surgical History:  Procedure Laterality Date   ABDOMINOPLASTY     AUGMENTATION MAMMAPLASTY Bilateral 81/8563   Silicone    PLACEMENT OF BREAST IMPLANTS     THYROID SURGERY      The following portions of the patient's history were reviewed and updated as appropriate: allergies, current medications, past family history, past medical history, past social history, past surgical history and problem list.   Health Maintenance:   Diagnosis  Date Value Ref Range Status  05/22/2020 - Low grade squamous intraepithelial lesion (LSIL) (A)  Final     Review of Systems:  Pertinent items noted in HPI and remainder of comprehensive ROS otherwise negative.  Physical Exam:  BP 121/76    Pulse 62    Resp 16    Ht 5' 3"  (1.6 m)    Wt 129 lb (58.5 kg)    BMI 22.85 kg/m  CONSTITUTIONAL: Well-developed, well-nourished female in no acute distress.  HEENT:  Normocephalic, atraumatic. External right and left ear normal. No scleral icterus.  NECK: Normal range of motion, supple, no masses noted on observation SKIN: No rash noted. Not diaphoretic. No erythema. No  pallor. MUSCULOSKELETAL: Normal range of motion. No edema noted. NEUROLOGIC: Alert and oriented to person, place, and time. Normal muscle tone coordination. No cranial nerve deficit noted. PSYCHIATRIC: Normal mood and affect. Normal behavior. Normal judgment and thought content.  CARDIOVASCULAR: Normal heart rate noted RESPIRATORY: Effort and breath sounds normal, no problems with respiration noted ABDOMEN: No masses noted. No other overt distention noted.    PELVIC: Normal appearing external genitalia; normal urethral meatus; normal appearing vaginal mucosa and cervix. IUD strings visible - no IUD visible or palpable at os. No abnormal discharge noted.  Normal uterine size, no other palpable masses, no uterine or adnexal tenderness. Performed in the presence of a chaperone  Labs and Imaging Reviewed this report from Care Everywhere: 07/07/16: IMPRESSION: 1. Uterus 11.4x5.2x5.1 cm. ([facility]). Retroflexed at C-section defect. Possible adenomyosis. 2. Patient states she is approximately week 3-4 of cycle. Endometrium 7-10 mm AP, likely secretory phase, with no abnormal blood flow seen with color Doppler (please note that abnormal thickening due to endometrial hyperplasia or a small focal mass cannot be excluded at this time of the menstrual cycle. This would require repeat imaging at day 4-6 of the cycle).  3. Normal appearing ovaries with no adnexal mass seen. 4. Minimal free fluid in the cul-de-sac.  5. No evidence of mass in the bladder-uterine interface; no rectosigmoid  wall thickening.  Assessment and Plan:  Itzamara was seen today for string check.  Diagnoses and all orders for this visit:  Abdominal cramping - IUD in place  IUD check up - Discussed IUD approved for 8 years but if issues with bleeding can be replaced at 5 years - We also discussed endometrial ablation and the associated risks that can commonly happen - 10% dissatisfaction, 25% end up with hyst after 5 years, post ablation  tubal syndrome and difficulty assessing for endometrial cancer. If she wishes to have ablation, she would need to have EMB prior to that (so come back in for preop appt).  - We discussed risk of hyst: prolonged recovery, limitations following surgery, longer time off work, risk of injury to bowel/bladder/ureters, but mainly high risk of injury to the bladder due to 3 prior c-sections. Discussed repair and recovery in that instance - She will consider but is seriously considering an ablation - We also discussed lysteda and other hormonal options. She had elevated BP with POP and combined OCP so she would prefer to avoid other hormonal options.   Low grade squamous intraepithelial lesion on cytologic smear of cervix (LGSIL) - She will need repap this March along with HPV testing     Routine preventative health maintenance measures emphasized. Please refer to After Visit Summary for other counseling recommendations.   Return in about 3 months (around 07/08/2021) for annual, Follow up (LSIL 05/2020).  Radene Gunning, MD, [place] [facility], [place]

## 2021-04-09 ENCOUNTER — Encounter: Payer: Self-pay | Admitting: Obstetrics and Gynecology

## 2021-04-09 ENCOUNTER — Ambulatory Visit: Payer: No Typology Code available for payment source | Admitting: Obstetrics and Gynecology

## 2021-04-09 ENCOUNTER — Other Ambulatory Visit: Payer: Self-pay

## 2021-04-09 VITALS — BP 121/76 | HR 62 | Resp 16 | Ht 63.0 in | Wt 129.0 lb

## 2021-04-09 DIAGNOSIS — R87612 Low grade squamous intraepithelial lesion on cytologic smear of cervix (LGSIL): Secondary | ICD-10-CM | POA: Diagnosis not present

## 2021-04-09 DIAGNOSIS — R109 Unspecified abdominal pain: Secondary | ICD-10-CM | POA: Diagnosis not present

## 2021-04-09 DIAGNOSIS — Z30431 Encounter for routine checking of intrauterine contraceptive device: Secondary | ICD-10-CM | POA: Diagnosis not present

## 2021-04-13 ENCOUNTER — Other Ambulatory Visit (HOSPITAL_COMMUNITY): Payer: Self-pay

## 2021-04-13 MED FILL — Atorvastatin Calcium Tab 20 MG (Base Equivalent): ORAL | 90 days supply | Qty: 90 | Fill #2 | Status: AC

## 2021-04-22 ENCOUNTER — Other Ambulatory Visit (HOSPITAL_BASED_OUTPATIENT_CLINIC_OR_DEPARTMENT_OTHER): Payer: Self-pay

## 2021-04-22 MED ORDER — FLUOROMETHOLONE 0.1 % OP SUSP
OPHTHALMIC | 1 refills | Status: DC
  Filled 2021-04-22: qty 10, 30d supply, fill #0

## 2021-04-27 ENCOUNTER — Other Ambulatory Visit: Payer: Self-pay | Admitting: Sports Medicine

## 2021-04-27 ENCOUNTER — Ambulatory Visit (INDEPENDENT_AMBULATORY_CARE_PROVIDER_SITE_OTHER): Payer: No Typology Code available for payment source

## 2021-04-27 ENCOUNTER — Other Ambulatory Visit: Payer: Self-pay

## 2021-04-27 DIAGNOSIS — M79642 Pain in left hand: Secondary | ICD-10-CM

## 2021-04-27 IMAGING — DX DG HAND COMPLETE 3+V*L*
3 series · 3 of 3 positions shown · non-contrast
Comparison: None.

CLINICAL DATA: Left 2nd and 3rd MCP joint pain

EXAM:
LEFT HAND - COMPLETE 3+ VIEW

[hand pa]
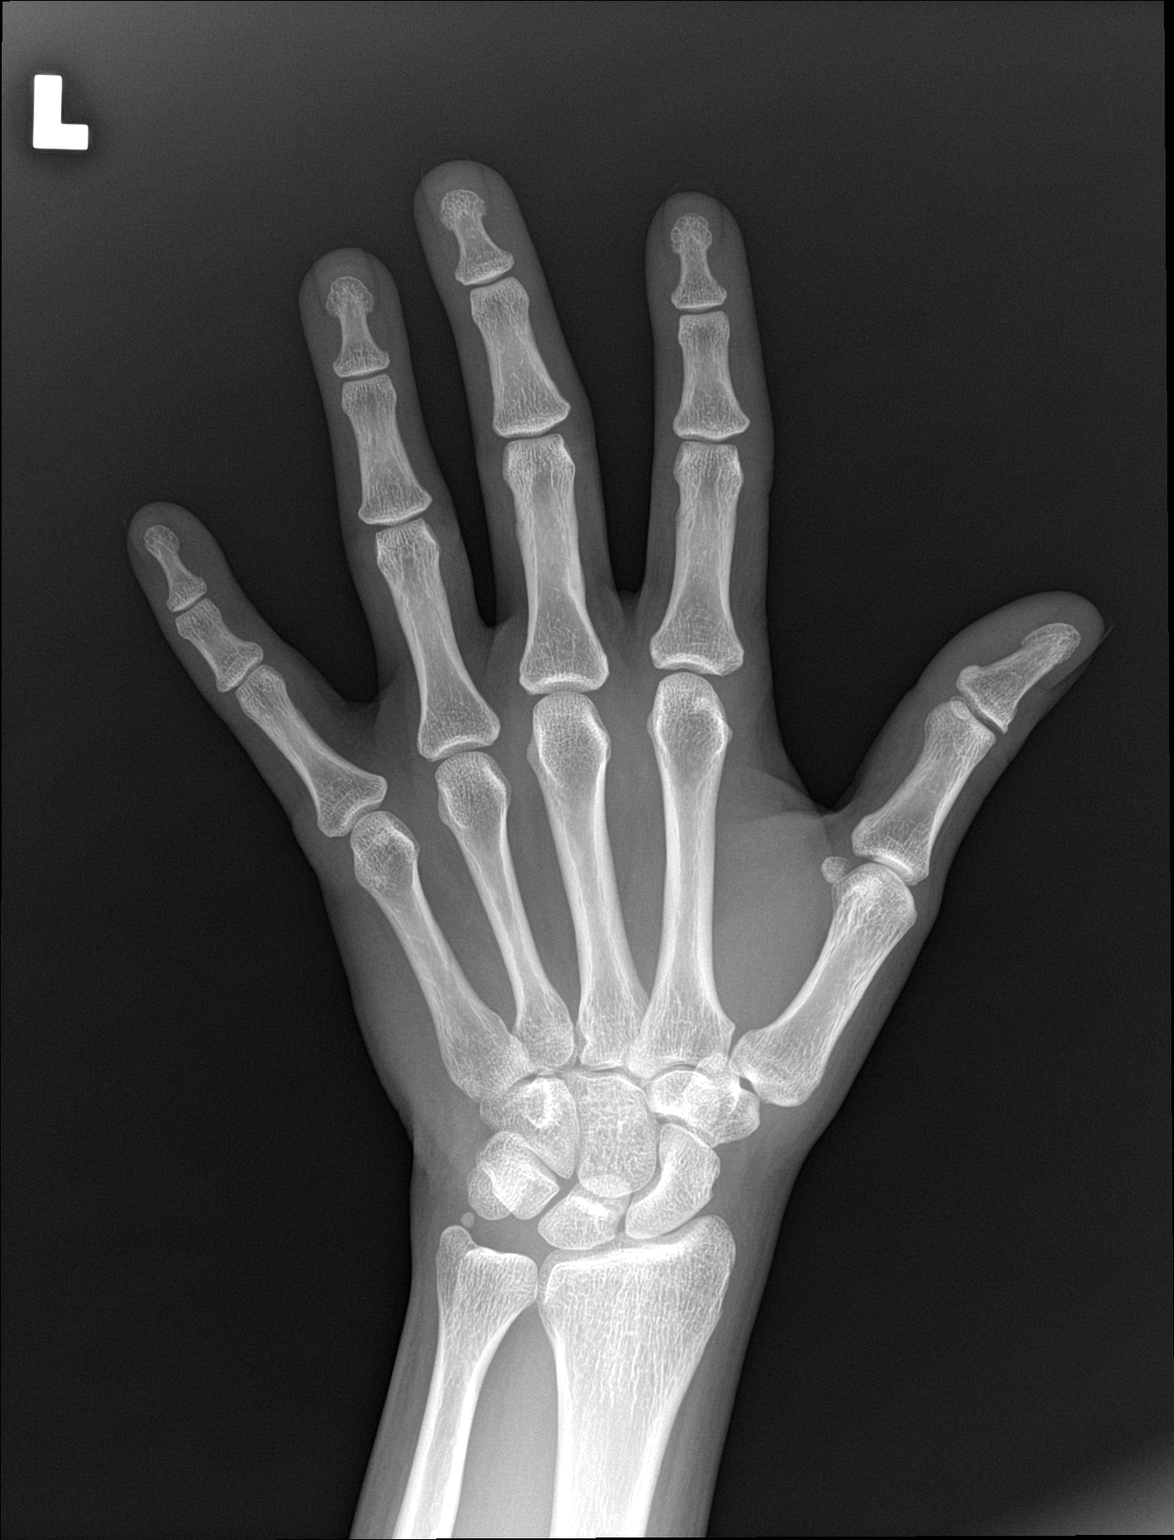

[hand obl]
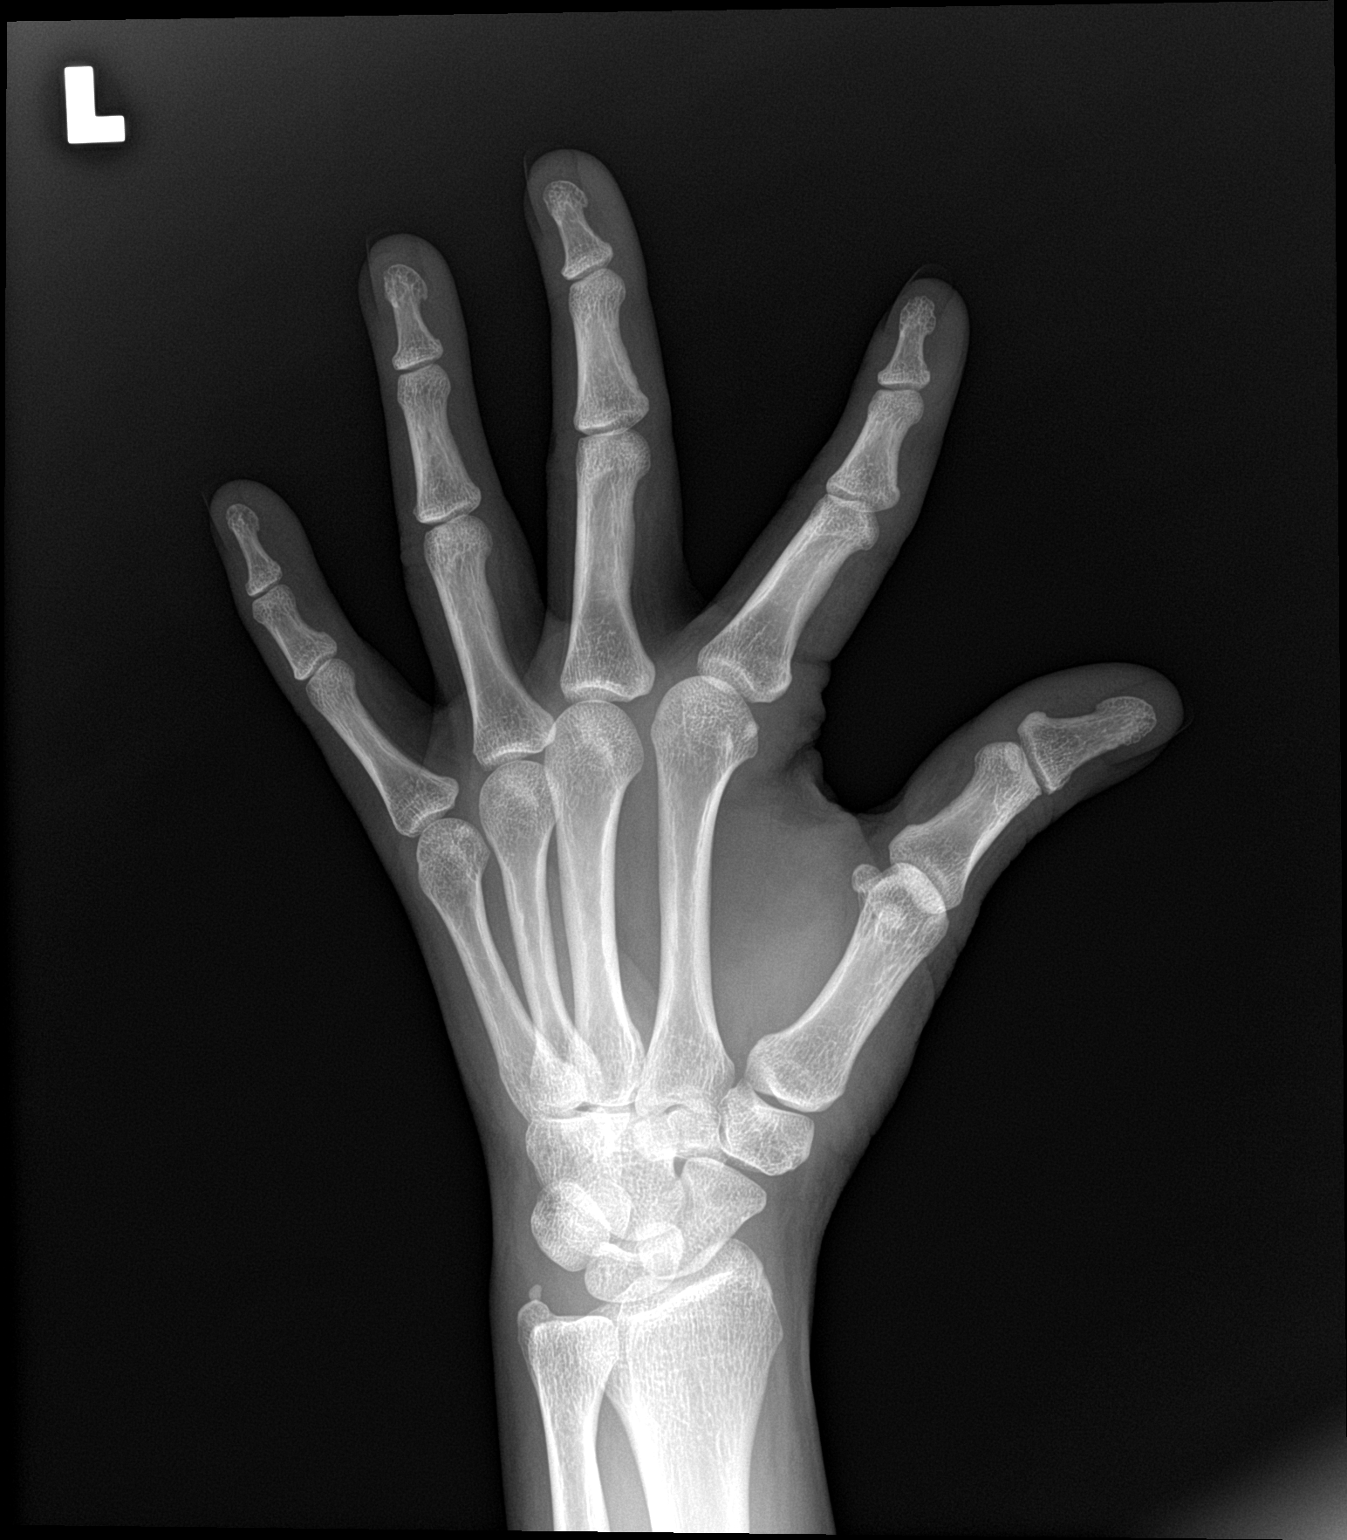

[hand lat]
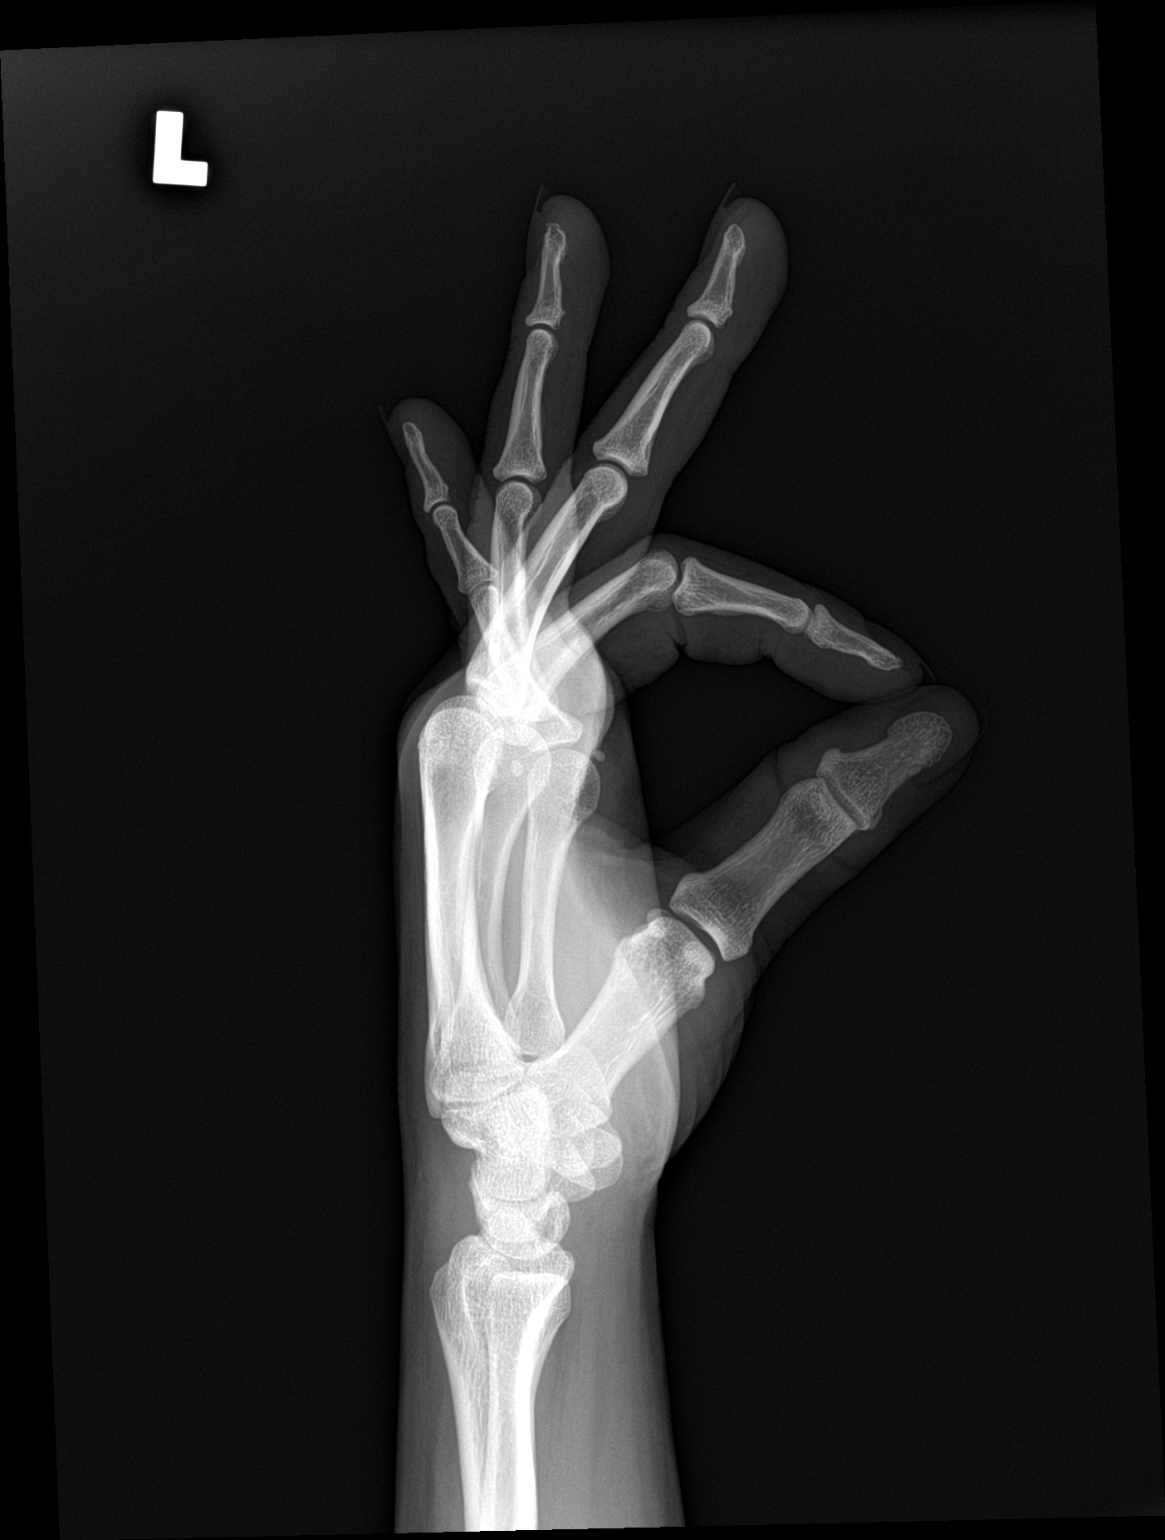

[3 of 3 positions shown; findings below may reference images not displayed]

FINDINGS: There is no evidence of fracture or dislocation. There is no
evidence of arthropathy or other focal bone abnormality. Soft
tissues are unremarkable.
IMPRESSION: Negative.

## 2021-04-27 NOTE — Assessment & Plan Note (Signed)
Increasing pain second and third MCPs, adding x-rays.  Continue brace for now.

## 2021-05-09 ENCOUNTER — Other Ambulatory Visit: Payer: Self-pay | Admitting: Sports Medicine

## 2021-05-09 DIAGNOSIS — M542 Cervicalgia: Secondary | ICD-10-CM

## 2021-05-11 ENCOUNTER — Other Ambulatory Visit (HOSPITAL_COMMUNITY): Payer: Self-pay

## 2021-05-11 ENCOUNTER — Other Ambulatory Visit: Payer: Self-pay | Admitting: Gastroenterology

## 2021-05-11 MED ORDER — LINACLOTIDE 290 MCG PO CAPS
290.0000 ug | ORAL_CAPSULE | Freq: Every day | ORAL | 1 refills | Status: DC
  Filled 2021-08-20: qty 90, 90d supply, fill #0

## 2021-05-11 MED ORDER — BACLOFEN 10 MG PO TABS
ORAL_TABLET | Freq: Every day | ORAL | 3 refills | Status: DC
  Filled 2021-05-11: qty 90, 90d supply, fill #0
  Filled 2021-08-09: qty 90, 90d supply, fill #1
  Filled 2021-11-06: qty 90, 90d supply, fill #2
  Filled 2022-02-03: qty 90, 90d supply, fill #3

## 2021-05-16 ENCOUNTER — Encounter: Payer: Self-pay | Admitting: Family Medicine

## 2021-05-18 ENCOUNTER — Other Ambulatory Visit (HOSPITAL_BASED_OUTPATIENT_CLINIC_OR_DEPARTMENT_OTHER): Payer: Self-pay

## 2021-05-18 MED ORDER — FINASTERIDE 5 MG PO TABS
ORAL_TABLET | ORAL | 3 refills | Status: DC
  Filled 2021-05-18: qty 45, 90d supply, fill #0
  Filled 2021-08-09: qty 45, 90d supply, fill #1
  Filled 2021-11-09: qty 45, 90d supply, fill #2
  Filled 2022-02-14: qty 45, 90d supply, fill #3

## 2021-05-19 ENCOUNTER — Other Ambulatory Visit: Payer: Self-pay | Admitting: *Deleted

## 2021-05-19 DIAGNOSIS — Z0000 Encounter for general adult medical examination without abnormal findings: Secondary | ICD-10-CM

## 2021-05-31 NOTE — Progress Notes (Signed)
? ?ANNUAL EXAM ?Patient name: April Webster MRN [id number]  Date of birth: 22-Aug-1975 ?Chief Complaint:   ?Annual Exam ? ?History of Present Illness:   ?April Webster is a 46 y.o. G77P3003 female being seen today for a routine annual exam.  ? ?Current complaints: None - she is still having the bleeding but undecided if she would like to change anything at this time.  ? ?No LMP recorded. (Menstrual status: IUD). ? ? ?The pregnancy intention screening data noted above was reviewed. Potential methods of contraception were discussed. The patient elected to proceed with No data recorded.  ? ?Last pap 05/2020. Results were: LSIL w/ HRHPV negative. H/O abnormal pap: no ?Health Maintenance Due  ?Topic Date Due  ? HIV Screening  Never done  ? COVID-19 Vaccine (3 - Booster for Pfizer series) 12/28/2019  ? ? ? ? ?  08/14/2020  ? 11:59 AM 08/20/2019  ?  7:25 AM 03/19/2019  ?  8:18 AM  ?Depression screen PHQ 2/9  ?Decreased Interest 0 0 0  ?Down, Depressed, Hopeless 0 0 1  ?PHQ - 2 Score 0 0 1  ?Altered sleeping 3    ?Tired, decreased energy 2    ?Change in appetite 0    ?Feeling bad or failure about yourself  0    ?Trouble concentrating 1    ?Moving slowly or fidgety/restless 0    ?Suicidal thoughts 0    ?PHQ-9 Score 6    ? ?  ? ?  08/14/2020  ? 11:59 AM 03/19/2019  ?  8:18 AM  ?GAD 7 : Generalized Anxiety Score  ?Nervous, Anxious, on Edge 0 1  ?Control/stop worrying 0 0  ?Worry too much - different things 0 0  ?Trouble relaxing 0 0  ?Restless 0 0  ?Easily annoyed or irritable 2 0  ?Afraid - awful might happen 0 0  ?Total GAD 7 Score 2 1  ?Anxiety Difficulty Not difficult at all Not difficult at all  ? ? ? ?Review of Systems:   ?Pertinent items are noted in HPI ?Denies any headaches, blurred vision, fatigue, shortness of breath, chest pain, abdominal pain, abnormal vaginal discharge/itching/odor/irritation, problems with periods, bowel movements, urination, or intercourse unless otherwise stated above.  ?Pertinent History Reviewed:  ?Reviewed  past medical,surgical, social and family history.  ?Reviewed problem list, medications and allergies. ?Physical Assessment:  ? ?Vitals:  ? 06/04/21 1337  ?BP: 114/74  ?Pulse: 66  ?Weight: 132 lb (59.9 kg)  ?Height: 5' 3"  (1.6 m)  ?Body mass index is 23.38 kg/m?. ?  ?Physical Examination:  ?General appearance - well appearing, and in no distress ?Mental status - alert, oriented to person, place, and time ?Psych:  She has a normal mood and affect ?Skin - warm and dry, normal color, no suspicious lesions noted ?Chest - effort normal, all lung fields clear to auscultation bilaterally ?Heart - normal rate and regular rhythm ?Neck:  midline trachea, no thyromegaly or nodules ?Breasts - breasts appear normal, no suspicious masses, no skin or nipple changes or  axillary nodes ?Abdomen - soft, nontender, nondistended, no masses or organomegaly ?Pelvic -  ?VULVA: normal appearing vulva with no masses, tenderness or lesions   ?VAGINA: normal appearing vagina with normal color and discharge, no lesions   ?CERVIX: normal appearing cervix without discharge or lesions, no CMT, IUD strings seen ?UTERUS: uterus is felt to be normal size, shape, consistency and nontender  ?ADNEXA: No adnexal masses or tenderness noted. ?Extremities:  No swelling or varicosities noted ? ?  Chaperone present for exam ? ?No results found for this or any previous visit (from the past 24 hour(s)).  ?Assessment & Plan:  ?Tiffanyann was seen today for annual exam. ? ?Diagnoses and all orders for this visit: ? ?Encounter for annual routine gynecological examination ?-     Cytology - PAP( [place]) ?- Cervical cancer screening: Discussed guidelines. Pap with HPV done  ?- STD Testing: not indicated ?- Birth Control: IUD (2018) ?- Breast Health: Encouraged self breast awareness/SBE. Teaching provided. Discussed limits of clinical breast exam for detecting breast cancer. Rx given for MXR. Last done 04/2020 ?- F/U 12 months and prn ? ?LGSIL on Pap smear of cervix ?-      Cytology - PAP( [facility]) ? ? ? ?Meds: No orders of the defined types were placed in this encounter. ? ? ?Follow-up: Return in about 1 year (around 06/05/2022) for annual. ? ?Radene Gunning, MD ?06/04/2021 ?2:09 PM ?

## 2021-06-01 ENCOUNTER — Other Ambulatory Visit: Payer: Self-pay | Admitting: Family Medicine

## 2021-06-01 DIAGNOSIS — Z1231 Encounter for screening mammogram for malignant neoplasm of breast: Secondary | ICD-10-CM

## 2021-06-04 ENCOUNTER — Other Ambulatory Visit (HOSPITAL_COMMUNITY)
Admission: RE | Admit: 2021-06-04 | Discharge: 2021-06-04 | Disposition: A | Discharge status: Home / Self Care | Payer: No Typology Code available for payment source | Source: Ambulatory Visit | Attending: Obstetrics and Gynecology | Admitting: Obstetrics and Gynecology

## 2021-06-04 ENCOUNTER — Other Ambulatory Visit: Payer: Self-pay

## 2021-06-04 ENCOUNTER — Encounter: Payer: Self-pay | Admitting: Obstetrics and Gynecology

## 2021-06-04 ENCOUNTER — Ambulatory Visit (INDEPENDENT_AMBULATORY_CARE_PROVIDER_SITE_OTHER): Payer: No Typology Code available for payment source | Admitting: Obstetrics and Gynecology

## 2021-06-04 VITALS — BP 114/74 | HR 66 | Ht 63.0 in | Wt 132.0 lb

## 2021-06-04 DIAGNOSIS — R87612 Low grade squamous intraepithelial lesion on cytologic smear of cervix (LGSIL): Secondary | ICD-10-CM | POA: Diagnosis not present

## 2021-06-04 DIAGNOSIS — Z01419 Encounter for gynecological examination (general) (routine) without abnormal findings: Secondary | ICD-10-CM | POA: Insufficient documentation

## 2021-06-05 ENCOUNTER — Encounter: Payer: Self-pay | Admitting: Family Medicine

## 2021-06-05 LAB — COMPLETE METABOLIC PANEL WITH GFR
AG Ratio: 1.9 (calc) (ref 1.0–2.5)
ALT: 88 U/L — ABNORMAL HIGH (ref 6–29)
AST: 65 U/L — ABNORMAL HIGH (ref 10–30)
Albumin: 4.2 g/dL (ref 3.6–5.1)
Alkaline phosphatase (APISO): 61 U/L (ref 31–125)
BUN: 19 mg/dL (ref 7–25)
CO2: 32 mmol/L (ref 20–32)
Calcium: 9.2 mg/dL (ref 8.6–10.2)
Chloride: 104 mmol/L (ref 98–110)
Creat: 0.76 mg/dL (ref 0.50–0.99)
Globulin: 2.2 g/dL (calc) (ref 1.9–3.7)
Glucose, Bld: 78 mg/dL (ref 65–139)
Potassium: 4.3 mmol/L (ref 3.5–5.3)
Sodium: 139 mmol/L (ref 135–146)
Total Bilirubin: 0.5 mg/dL (ref 0.2–1.2)
Total Protein: 6.4 g/dL (ref 6.1–8.1)
eGFR: 99 mL/min/{1.73_m2} (ref >=60)

## 2021-06-05 LAB — CBC
HCT: 40.2 % (ref 35.0–45.0)
Hemoglobin: 13.6 g/dL (ref 11.7–15.5)
MCH: 33.2 pg — ABNORMAL HIGH (ref 27.0–33.0)
MCHC: 33.8 g/dL (ref 32.0–36.0)
MCV: 98 fL (ref 80.0–100.0)
MPV: 11.6 fL (ref 7.5–12.5)
Platelets: 219 10*3/uL (ref 140–400)
RBC: 4.1 10*6/uL (ref 3.80–5.10)
RDW: 11.9 % (ref 11.0–15.0)
WBC: 4 10*3/uL (ref 3.8–10.8)

## 2021-06-05 LAB — LIPID PANEL
Cholesterol: 144 mg/dL (ref <200)
HDL: 58 mg/dL (ref >=50)
LDL Cholesterol (Calc): 73 mg/dL (calc)
Non-HDL Cholesterol (Calc): 86 mg/dL (calc) (ref <130)
Total CHOL/HDL Ratio: 2.5 (calc) (ref <5.0)
Triglycerides: 51 mg/dL (ref <150)

## 2021-06-05 LAB — TSH: TSH: 4.37 mIU/L

## 2021-06-05 NOTE — Progress Notes (Signed)
[place], liver function is hanging in the 58s to 80s.  Up just a smidgen from when you went to [facility] 3 to 4 weeks ago.  But still better than 2 months ago. ?Your TSH went up to 4.  Have you missed any of your thyroid medication lately you run out?  We can always recheck it again in 6 weeks and if it still on the higher end then we can always make a slight adjustment to your regimen.  Your cholesterol looks great, great job!

## 2021-06-08 ENCOUNTER — Other Ambulatory Visit: Payer: Self-pay

## 2021-06-08 ENCOUNTER — Ambulatory Visit (INDEPENDENT_AMBULATORY_CARE_PROVIDER_SITE_OTHER): Payer: No Typology Code available for payment source | Admitting: Family Medicine

## 2021-06-08 ENCOUNTER — Other Ambulatory Visit (HOSPITAL_COMMUNITY): Payer: Self-pay

## 2021-06-08 ENCOUNTER — Encounter: Payer: Self-pay | Admitting: Family Medicine

## 2021-06-08 VITALS — BP 115/79 | HR 66 | Temp 98.3°F | Resp 16 | Ht 63.0 in | Wt 128.0 lb

## 2021-06-08 DIAGNOSIS — Z0000 Encounter for general adult medical examination without abnormal findings: Secondary | ICD-10-CM

## 2021-06-08 DIAGNOSIS — E039 Hypothyroidism, unspecified: Secondary | ICD-10-CM

## 2021-06-08 MED ORDER — BUPROPION HCL ER (XL) 150 MG PO TB24
150.0000 mg | ORAL_TABLET | Freq: Every day | ORAL | 0 refills | Status: DC
  Filled 2021-06-08: qty 90, 90d supply, fill #0

## 2021-06-08 NOTE — Progress Notes (Signed)
Subjective:  ?  ? April Webster is a 46 y.o. female and is here for a comprehensive physical exam. The patient reports problems -   . ? ?Hypothyroidism-recent TSH level was slightly elevated.  She has been noticing a bit slower heart rate at night dropping into the low 50s she has been a little bit more fatigued and irritable lately. ? ?Exercises regularly,  she is been going to a self-defense classes 2 days a week and still doing her spin bike and weights for 25 to 40 minutes each time. ? ?Social History  ? ?Socioeconomic History  ? Marital status: Married  ?  Spouse name: Not on file  ? Number of children: 3  ? Years of education: Not on file  ? Highest education level: Not on file  ?Occupational History  ? Occupation: NP at [employer]  ?Tobacco Use  ? Smoking status: Former  ?  Packs/day: 2.00  ?  Years: 5.00  ?  Pack years: 10.00  ?  Types: Cigarettes  ?  Quit date: 03/15/1996  ?  Years since quitting: 25.2  ? Smokeless tobacco: Never  ?Vaping Use  ? Vaping Use: Never used  ?Substance and Sexual Activity  ? Alcohol use: Not Currently  ?  Alcohol/week: 2.0 standard drinks  ?  Types: 2 Glasses of wine per week  ? Drug use: Never  ? Sexual activity: Yes  ?  Partners: Male  ?  Birth control/protection: I.U.D.  ?Other Topics Concern  ? Not on file  ?Social History Narrative  ? Not on file  ? ?Social Determinants of Health  ? ?Financial Resource Strain: Not on file  ?Food Insecurity: Not on file  ?Transportation Needs: Not on file  ?Physical Activity: Not on file  ?Stress: Not on file  ?Social Connections: Not on file  ?Intimate Partner Violence: Not on file  ? ?Health Maintenance  ?Topic Date Due  ? HIV Screening  Never done  ? COVID-19 Vaccine (3 - Booster for Pfizer series) 12/28/2019  ? PAP SMEAR-Modifier  05/22/2025  ? TETANUS/TDAP  04/29/2027  ? INFLUENZA VACCINE  Completed  ? Hepatitis C Screening  Completed  ? Pneumococcal Vaccine 79-65 Years old  Aged Out  ? HPV VACCINES  Aged Out  ? ? ?The following portions  of the patient's history were reviewed and updated as appropriate: allergies, current medications, past family history, past medical history, past social history, past surgical history, and problem list. ? ?Review of Systems ?Pertinent items are noted in HPI.  ? ?Objective:  ? ? BP 115/79   Pulse 66   Temp 98.3 ?F (36.8 ?C)   Resp 16   Ht 5' 3"  (1.6 m)   Wt 128 lb (58.1 kg)   SpO2 99%   BMI 22.67 kg/m?  ?General appearance: alert, cooperative, and appears stated age ?Head: Normocephalic, without obvious abnormality, atraumatic ?Eyes:  conj clear, EOMi , PEERLA ?Ears: normal TM's and external ear canals both ears ?Nose: Nares normal. Septum midline. Mucosa normal. No drainage or sinus tenderness. ?Throat: lips, mucosa, and tongue normal; teeth and gums normal ?Neck: no adenopathy, no carotid bruit, no JVD, supple, symmetrical, trachea midline, and thyroid not enlarged, symmetric, no tenderness/mass/nodules ?Back: symmetric, no curvature. ROM normal. No CVA tenderness. ?Lungs: clear to auscultation bilaterally ?Heart: regular rate and rhythm, S1, S2 normal, no murmur, click, rub or gallop ?Abdomen: soft, non-tender; bowel sounds normal; no masses,  no organomegaly ?Extremities: extremities normal, atraumatic, no cyanosis or edema ?Pulses: 2+ and  symmetric ?Skin: Skin color, texture, turgor normal. No rashes or lesions ?Lymph nodes: Cervical, supraclavicular, and axillary nodes normal. ?Neurologic: Grossly normal  ?  ?Assessment:  ? ? Healthy female exam.    ?  ?Plan:  ? ?  ?See After Visit Summary for Counseling Recommendations  ?Keep up a regular exercise program and make sure you are eating a healthy diet ?Try to eat 4 servings of dairy a day, or if you are lactose intolerant take a calcium with vitamin D daily.  ?Your vaccines are up to date.  ?Had gyn appt last week.   ? ?Mood/irritability-recommend a trial of Wellbutrin.  New prescription sent to pharmacy. ? ?Hypothyroidism ?Take extra half a tab once a  week and then plan to recheck TSH in 6 weeks. ? ?

## 2021-06-08 NOTE — Telephone Encounter (Signed)
Addressed during OV this AM ?

## 2021-06-08 NOTE — Assessment & Plan Note (Signed)
Take extra half a tab once a week and then plan to recheck TSH in 6 weeks. ?

## 2021-06-10 ENCOUNTER — Other Ambulatory Visit (HOSPITAL_COMMUNITY): Payer: Self-pay

## 2021-06-10 ENCOUNTER — Other Ambulatory Visit: Payer: Self-pay | Admitting: *Deleted

## 2021-06-10 MED ORDER — ALBUTEROL SULFATE HFA 108 (90 BASE) MCG/ACT IN AERS
2.0000 | INHALATION_SPRAY | Freq: Four times a day (QID) | RESPIRATORY_TRACT | 2 refills | Status: AC | PRN
  Filled 2021-06-10: qty 18, 25d supply, fill #0

## 2021-06-11 LAB — CYTOLOGY - PAP
Comment: NEGATIVE
Diagnosis: NEGATIVE
High risk HPV: NEGATIVE

## 2021-06-27 ENCOUNTER — Other Ambulatory Visit: Payer: Self-pay | Admitting: Family Medicine

## 2021-06-29 ENCOUNTER — Other Ambulatory Visit (HOSPITAL_COMMUNITY): Payer: Self-pay

## 2021-06-29 MED ORDER — SYNTHROID 100 MCG PO TABS
100.0000 ug | ORAL_TABLET | Freq: Every day | ORAL | 0 refills | Status: DC
  Filled 2021-06-29: qty 90, 90d supply, fill #0

## 2021-06-29 MED ORDER — MELOXICAM 15 MG PO TABS
ORAL_TABLET | Freq: Every day | ORAL | 1 refills | Status: DC
  Filled 2021-06-29: qty 90, 90d supply, fill #0
  Filled 2021-09-15: qty 90, 90d supply, fill #1

## 2021-07-08 ENCOUNTER — Encounter: Payer: Self-pay | Admitting: Family Medicine

## 2021-07-08 DIAGNOSIS — F902 Attention-deficit hyperactivity disorder, combined type: Secondary | ICD-10-CM

## 2021-07-14 ENCOUNTER — Other Ambulatory Visit (HOSPITAL_BASED_OUTPATIENT_CLINIC_OR_DEPARTMENT_OTHER): Payer: Self-pay

## 2021-07-14 ENCOUNTER — Other Ambulatory Visit: Payer: Self-pay | Admitting: Family Medicine

## 2021-07-14 MED ORDER — ATORVASTATIN CALCIUM 20 MG PO TABS
20.0000 mg | ORAL_TABLET | Freq: Every day | ORAL | 3 refills | Status: DC
  Filled 2021-07-14: qty 90, 90d supply, fill #0
  Filled 2021-10-07: qty 90, 90d supply, fill #1
  Filled 2022-01-05: qty 90, 90d supply, fill #2
  Filled 2022-04-03: qty 90, 90d supply, fill #3

## 2021-07-14 MED ORDER — AMPHETAMINE-DEXTROAMPHET ER 10 MG PO CP24
10.0000 mg | ORAL_CAPSULE | Freq: Every morning | ORAL | 0 refills | Status: DC
  Filled 2021-07-14: qty 30, 30d supply, fill #0

## 2021-07-14 NOTE — Progress Notes (Signed)
Ok to start a stimulant.  We will start a low-dose of 10 mg Adderall.  Monitor blood pressure carefully.  She use typically takes her blood pressure daily ? ?Meds ordered this encounter  ?Medications  ? amphetamine-dextroamphetamine (ADDERALL XR) 10 MG 24 hr capsule  ?  Sig: Take 1 capsule (10 mg total) by mouth in the morning.  ?  Dispense:  30 capsule  ?  Refill:  0  ? ? ?

## 2021-07-21 ENCOUNTER — Ambulatory Visit (HOSPITAL_BASED_OUTPATIENT_CLINIC_OR_DEPARTMENT_OTHER)
Admission: RE | Admit: 2021-07-21 | Discharge: 2021-07-21 | Disposition: A | Discharge status: Home / Self Care | Payer: No Typology Code available for payment source | Source: Ambulatory Visit | Attending: Family Medicine | Admitting: Family Medicine

## 2021-07-21 ENCOUNTER — Other Ambulatory Visit: Payer: Self-pay | Admitting: Family Medicine

## 2021-07-21 ENCOUNTER — Other Ambulatory Visit (HOSPITAL_BASED_OUTPATIENT_CLINIC_OR_DEPARTMENT_OTHER): Payer: Self-pay | Admitting: Family Medicine

## 2021-07-21 ENCOUNTER — Telehealth: Payer: Self-pay | Admitting: Family Medicine

## 2021-07-21 DIAGNOSIS — R1032 Left lower quadrant pain: Secondary | ICD-10-CM

## 2021-07-21 IMAGING — US US PELVIS COMPLETE
1 series · 13 of 25 positions shown · non-contrast
Comparison: None Available.

CLINICAL DATA: Left lower quadrant abdominal and pelvic pain.

EXAM:
TRANSABDOMINAL AND TRANSVAGINAL ULTRASOUND OF PELVIS
DOPPLER ULTRASOUND OF OVARIES
TECHNIQUE: Both transabdominal and transvaginal ultrasound examinations of the
pelvis were performed. Transabdominal technique was performed for
global imaging of the pelvis including uterus, ovaries, adnexal
regions, and pelvic cul-de-sac.
It was necessary to proceed with endovaginal exam following the
transabdominal exam to visualize the uterus and ovaries more
clearly. Color and duplex Doppler ultrasound was utilized to
evaluate blood flow to the ovaries.

[Series 1: us pelvis complete · 13 of 60 slices shown]
[im 1/60]
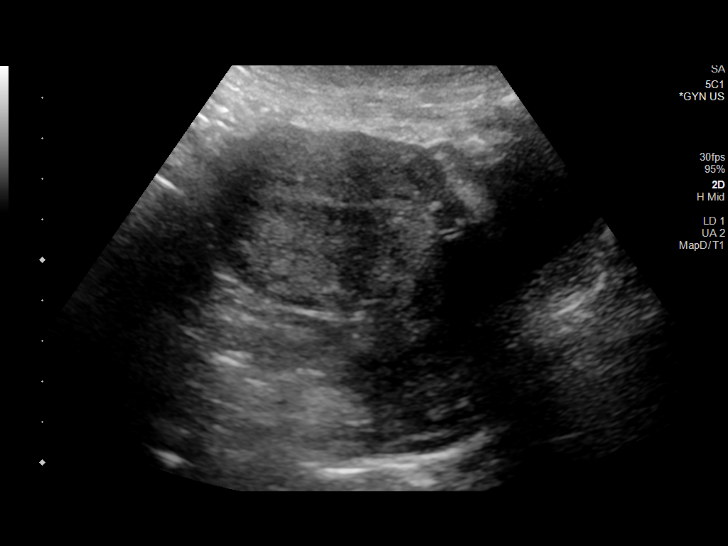
[im 5/60]
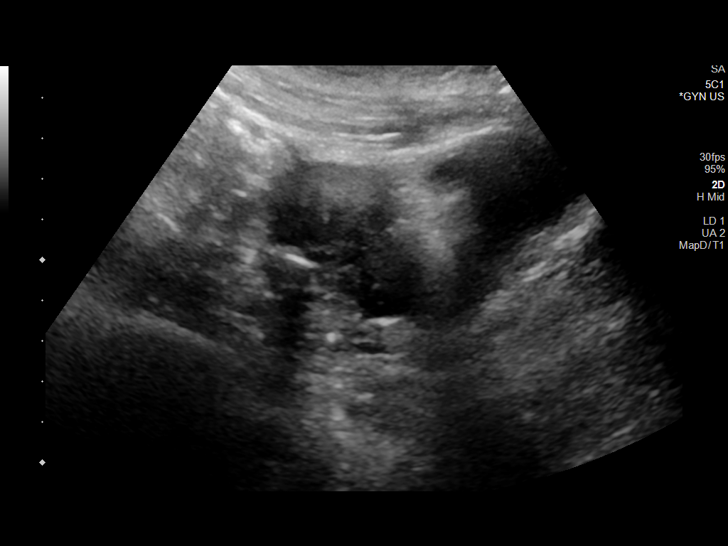
[im 10/60]
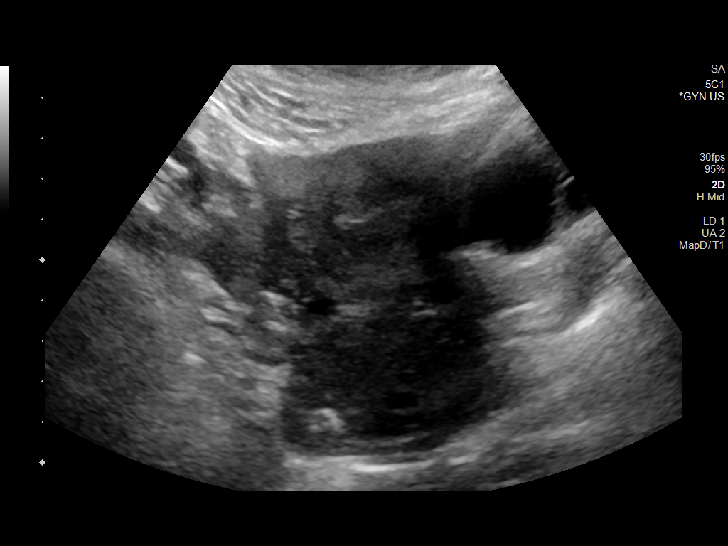
[im 15/60]
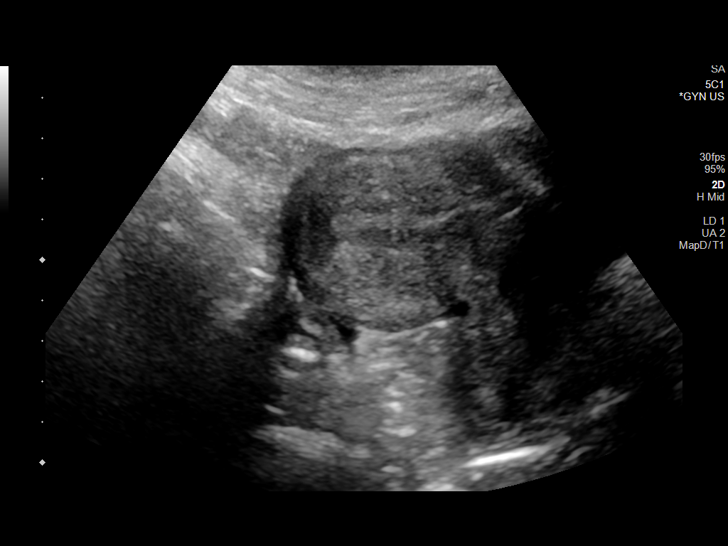
[im 20/60]
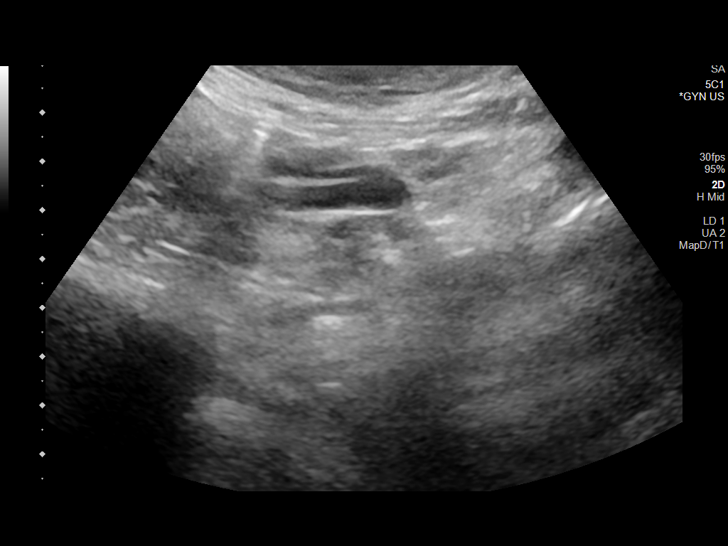
[im 25/60]
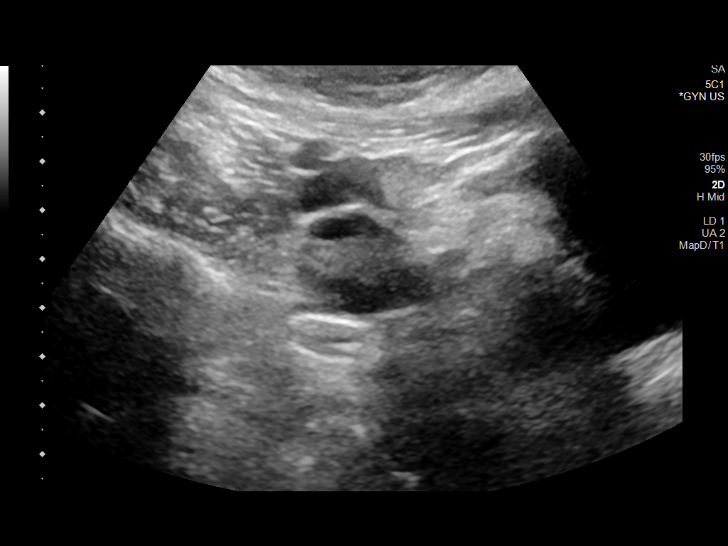
[im 30/60]
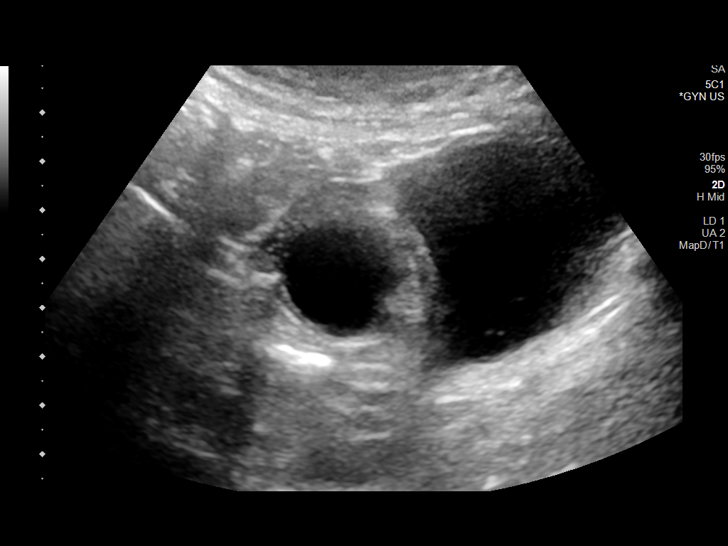
[im 35/60]
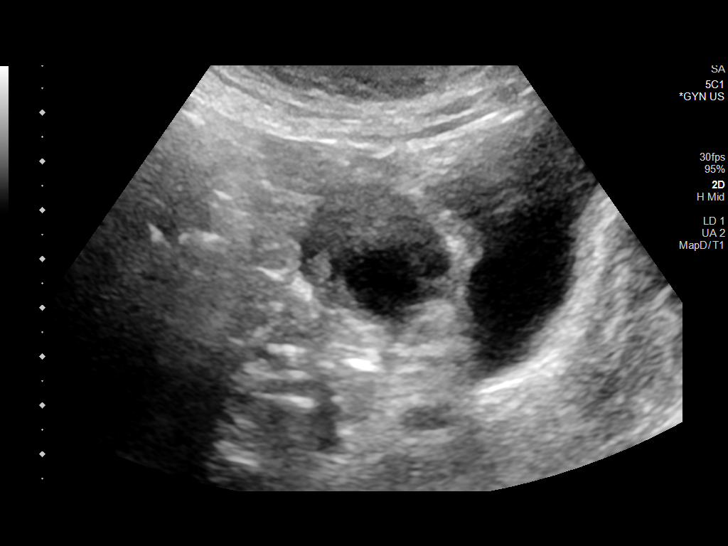
[im 40/60]
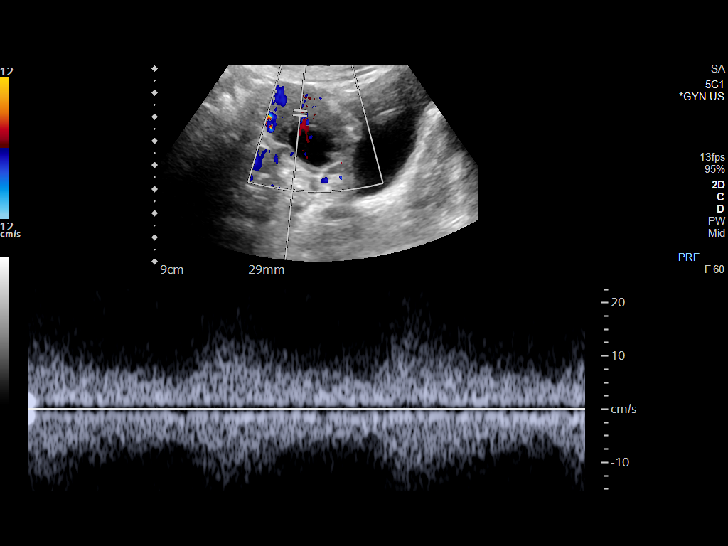
[im 45/60]
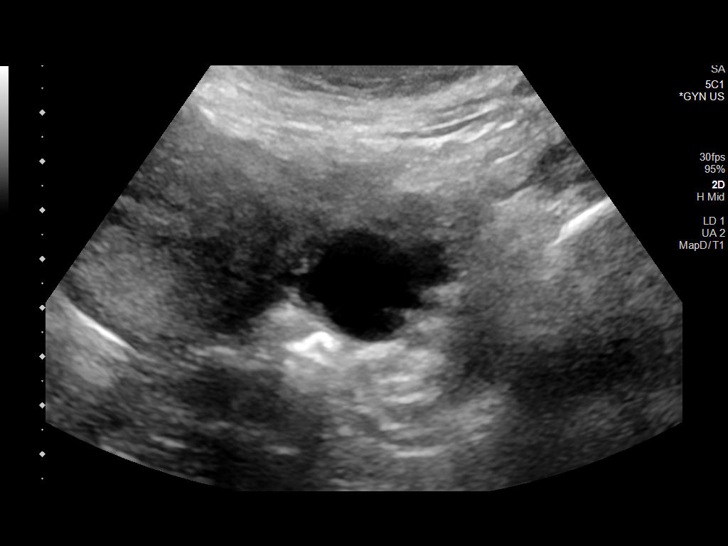
[im 50/60]
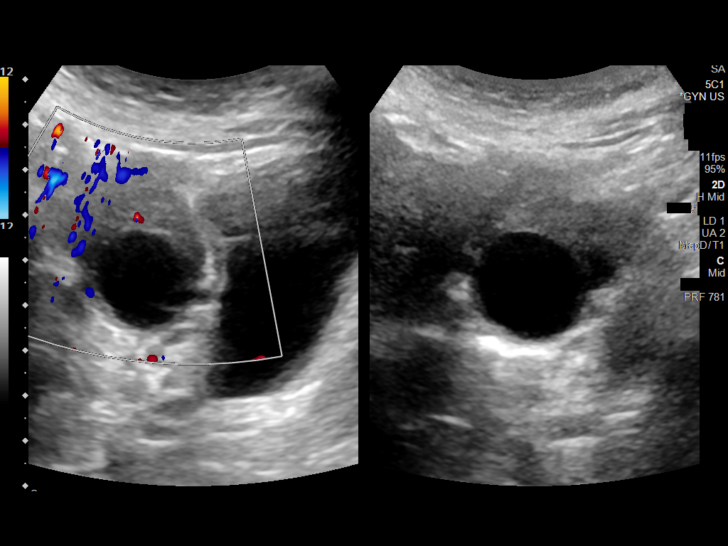
[im 55/60]
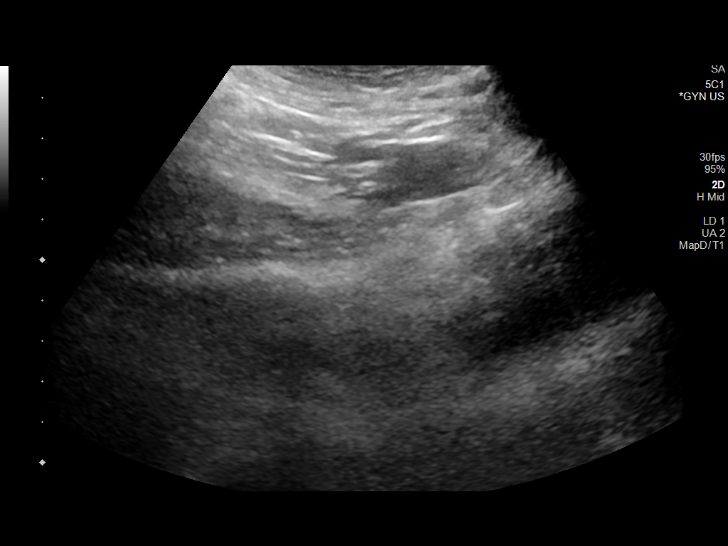
[im 60/60]
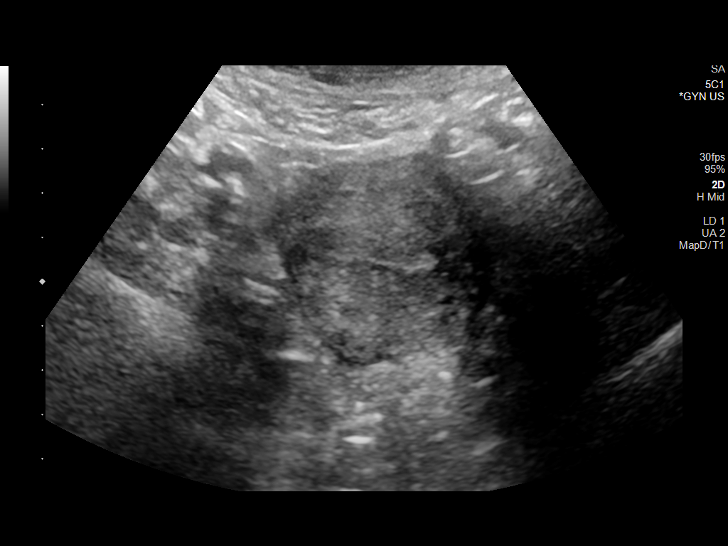

[13 of 25 positions shown; findings below may reference images not displayed]

FINDINGS: Uterus

Measurements: 7.8 x 4.0 x 5.4 cm = volume: 98 mL. No fibroids or
other mass visualized.

Endometrium

Thickness: 6.6 mm.  There is

Right ovary

Measurements: 2.4 x 1.8 x 2.4 cm = volume: 5.3 mL. Normal
appearance. Normal color flow and Doppler.

Left ovary

Measurements: 4.0 x 2.9 x 4.0 cm = volume: 24 mL. Ovarian cyst
measuring 2.6 x 2.0 x 2.4 cm. No follow-up recommended. No other
finding. Normal color flow and Doppler.

Pulsed Doppler evaluation of both ovaries demonstrates normal
low-resistance arterial and venous waveforms.

Other findings

No free fluid.
IMPRESSION: No pathologic finding. 2.6 cm follicular cyst of the left ovary. No
follow-up recommended. There is some potential this could be
symptomatic, but there is no finding to suggest that it is
pathologic.

## 2021-07-21 NOTE — Telephone Encounter (Signed)
LLQ pain that started a few hours ago.  No fever.  Has felt a little nauseated just for a few minutes but no vomiting.  She does have a history of chronic constipation and bowels usually move every other day, last bowel movement was yesterday.  She has had prior history of ovarian cyst but they have not normally been painful. ?

## 2021-07-22 ENCOUNTER — Telehealth: Payer: Self-pay | Admitting: Family Medicine

## 2021-07-22 DIAGNOSIS — R1032 Left lower quadrant pain: Secondary | ICD-10-CM

## 2021-07-22 DIAGNOSIS — R7401 Elevation of levels of liver transaminase levels: Secondary | ICD-10-CM

## 2021-07-22 DIAGNOSIS — E039 Hypothyroidism, unspecified: Secondary | ICD-10-CM

## 2021-07-22 LAB — TSH: TSH: 4.98 mIU/L — ABNORMAL HIGH

## 2021-07-22 LAB — CBC WITH DIFFERENTIAL/PLATELET
Absolute Monocytes: 452 cells/uL (ref 200–950)
Basophils Absolute: 52 cells/uL (ref 0–200)
Basophils Relative: 1 %
Eosinophils Absolute: 302 cells/uL (ref 15–500)
Eosinophils Relative: 5.8 %
HCT: 42.8 % (ref 35.0–45.0)
Hemoglobin: 14.6 g/dL (ref 11.7–15.5)
Lymphs Abs: 1846 cells/uL (ref 850–3900)
MCH: 33.3 pg — ABNORMAL HIGH (ref 27.0–33.0)
MCHC: 34.1 g/dL (ref 32.0–36.0)
MCV: 97.7 fL (ref 80.0–100.0)
MPV: 11.2 fL (ref 7.5–12.5)
Monocytes Relative: 8.7 %
Neutro Abs: 2548 cells/uL (ref 1500–7800)
Neutrophils Relative %: 49 %
Platelets: 223 10*3/uL (ref 140–400)
RBC: 4.38 10*6/uL (ref 3.80–5.10)
RDW: 11.8 % (ref 11.0–15.0)
Total Lymphocyte: 35.5 %
WBC: 5.2 10*3/uL (ref 3.8–10.8)

## 2021-07-22 LAB — HEPATIC FUNCTION PANEL
AG Ratio: 1.9 (calc) (ref 1.0–2.5)
ALT: 45 U/L — ABNORMAL HIGH (ref 6–29)
AST: 34 U/L — ABNORMAL HIGH (ref 10–30)
Albumin: 4.6 g/dL (ref 3.6–5.1)
Alkaline phosphatase (APISO): 63 U/L (ref 31–125)
Bilirubin, Direct: 0.1 mg/dL (ref 0.0–0.2)
Globulin: 2.4 g/dL (calc) (ref 1.9–3.7)
Indirect Bilirubin: 0.5 mg/dL (calc) (ref 0.2–1.2)
Total Bilirubin: 0.6 mg/dL (ref 0.2–1.2)
Total Protein: 7 g/dL (ref 6.1–8.1)

## 2021-07-22 NOTE — Progress Notes (Signed)
Patient aware of results.  Left lower quadrant pain improved somewhat today.

## 2021-07-22 NOTE — Telephone Encounter (Signed)
Still having some left lower quadrant discomfort today not as painful feels more like a pressure.  She used her heating bad last night and just tried to lay down and get some rest.  She says she feels really tired and exhausted but never ran a fever.  We will get a CBC with differential today just to make sure there is no elevation of white count.  Ultrasound results just showed a little bit larger than 2 cm cyst but I really do not think this is causing most of her pain. ?

## 2021-07-23 ENCOUNTER — Encounter: Payer: Self-pay | Admitting: Family Medicine

## 2021-07-23 ENCOUNTER — Other Ambulatory Visit (HOSPITAL_BASED_OUTPATIENT_CLINIC_OR_DEPARTMENT_OTHER): Payer: Self-pay

## 2021-07-23 MED ORDER — SYNTHROID 125 MCG PO TABS
125.0000 ug | ORAL_TABLET | Freq: Every day | ORAL | 0 refills | Status: DC
  Filled 2021-07-23: qty 90, 90d supply, fill #0

## 2021-07-23 NOTE — Progress Notes (Signed)
Hi April Webster, looks like we need to incease your thyroid med. Just remind me.... are you taking 1 whole tab daily?

## 2021-07-24 ENCOUNTER — Other Ambulatory Visit (HOSPITAL_BASED_OUTPATIENT_CLINIC_OR_DEPARTMENT_OTHER): Payer: Self-pay

## 2021-08-11 ENCOUNTER — Other Ambulatory Visit (HOSPITAL_COMMUNITY): Payer: Self-pay

## 2021-08-12 ENCOUNTER — Other Ambulatory Visit (HOSPITAL_COMMUNITY): Payer: Self-pay

## 2021-08-12 ENCOUNTER — Telehealth: Payer: Self-pay | Admitting: Sports Medicine

## 2021-08-12 ENCOUNTER — Ambulatory Visit (INDEPENDENT_AMBULATORY_CARE_PROVIDER_SITE_OTHER): Payer: No Typology Code available for payment source

## 2021-08-12 DIAGNOSIS — G8929 Other chronic pain: Secondary | ICD-10-CM | POA: Insufficient documentation

## 2021-08-12 DIAGNOSIS — M25561 Pain in right knee: Secondary | ICD-10-CM | POA: Diagnosis not present

## 2021-08-12 IMAGING — DX DG KNEE COMPLETE 4+V*R*
4 series · 4 of 4 positions shown · non-contrast
Comparison: None Available.

CLINICAL DATA: Pain.

EXAM:
RIGHT KNEE - COMPLETE 4+ VIEW

[knee ap bilat standing (1 of 2)]
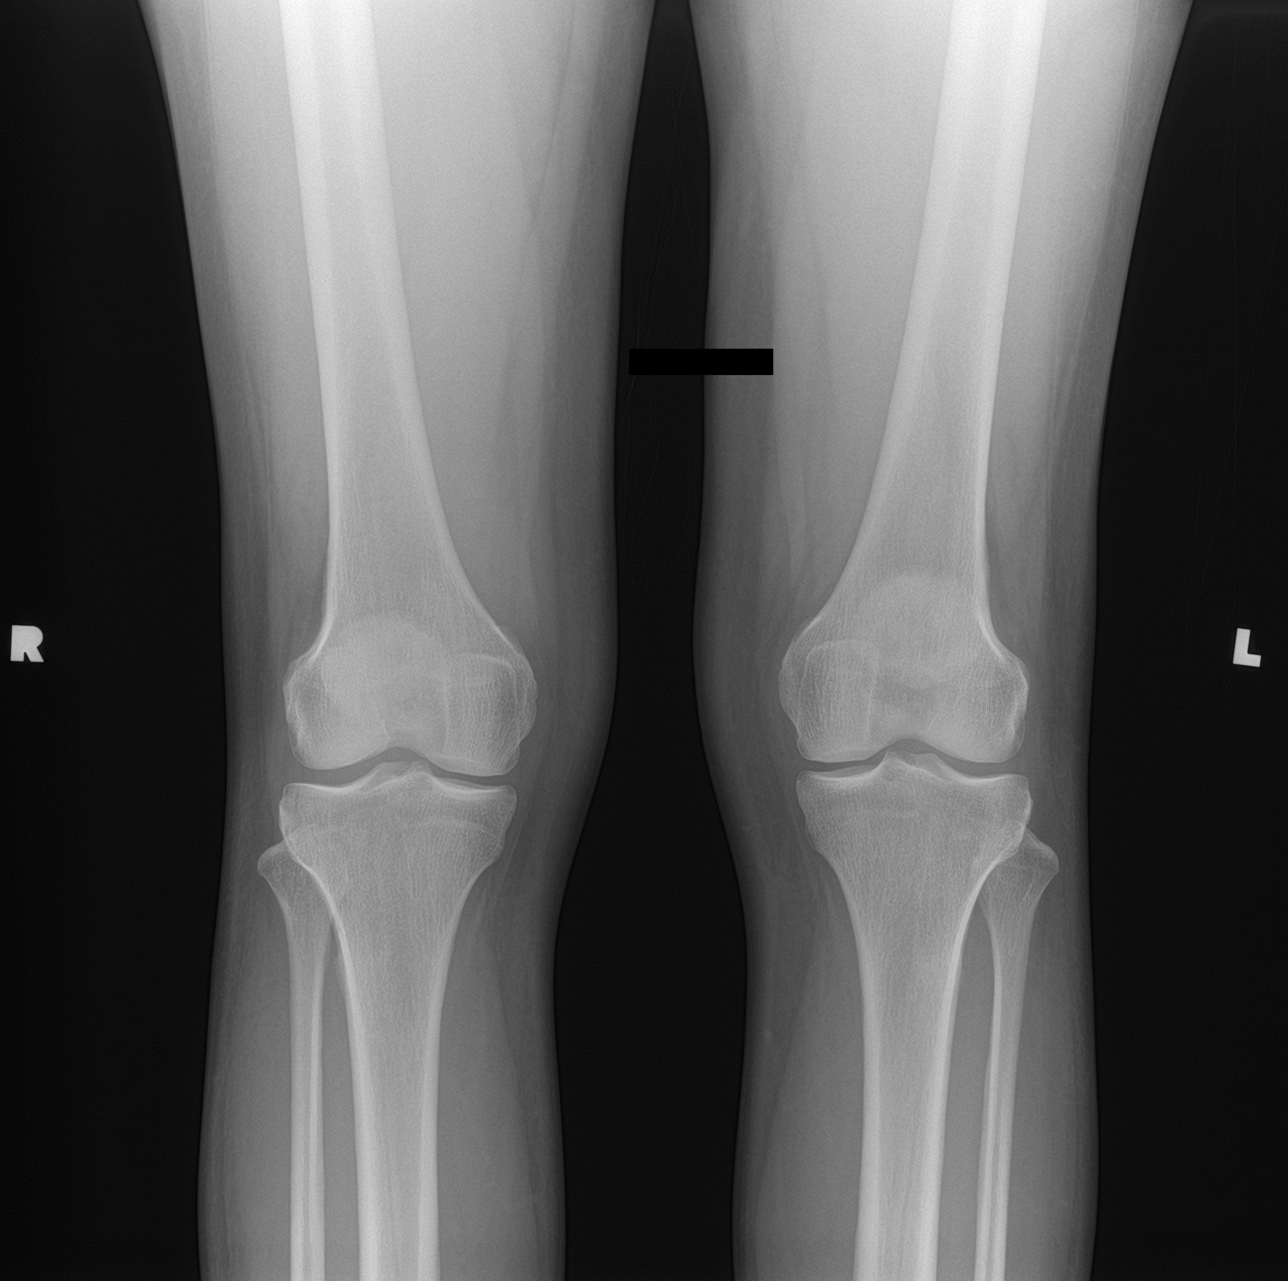

[knee obl]
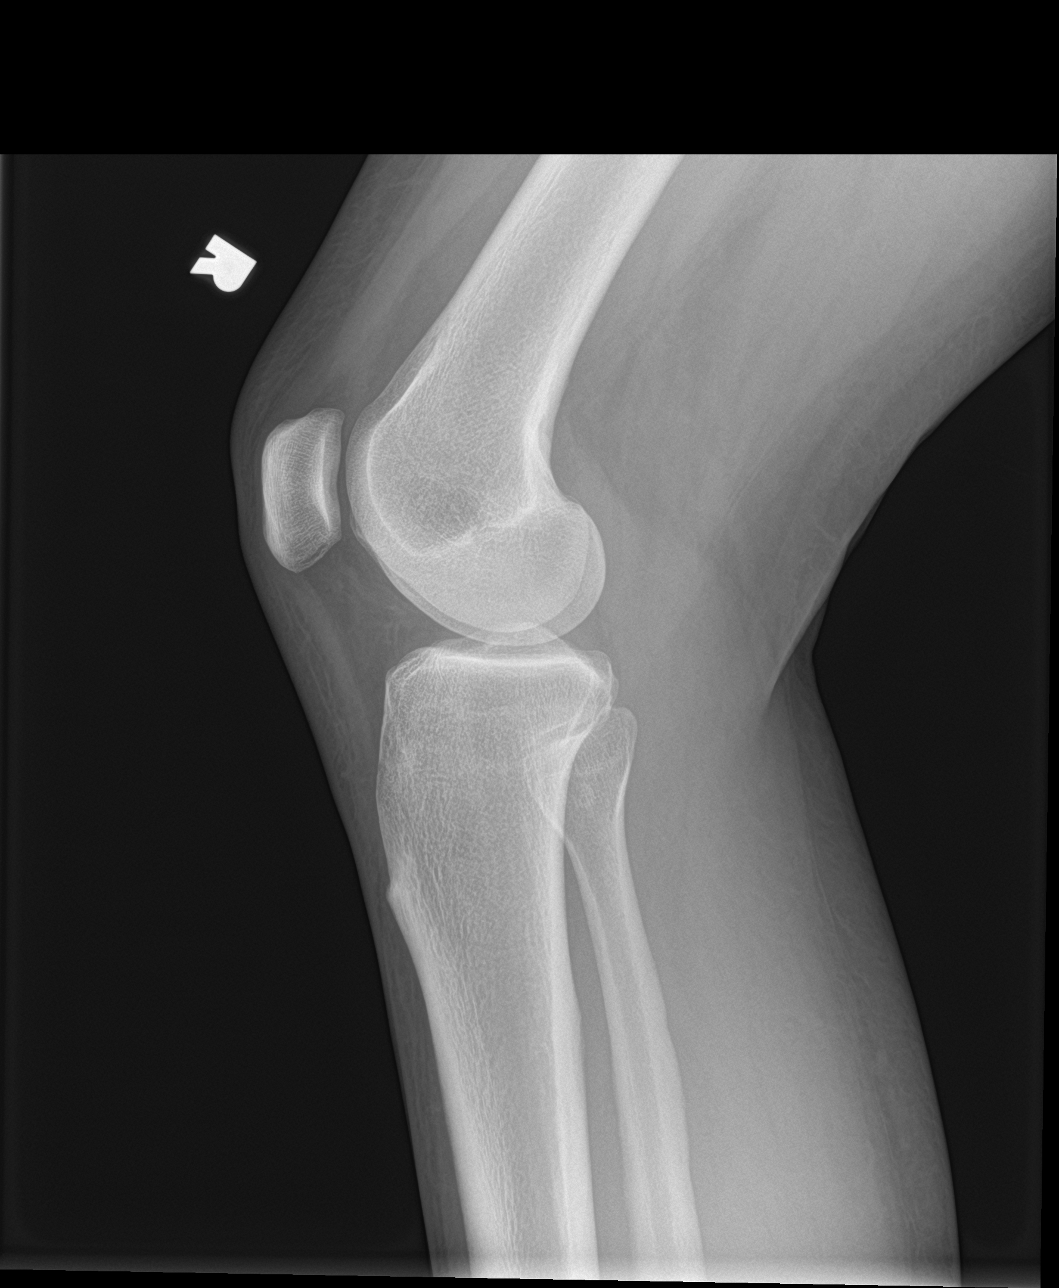

[knee sunrise standing]
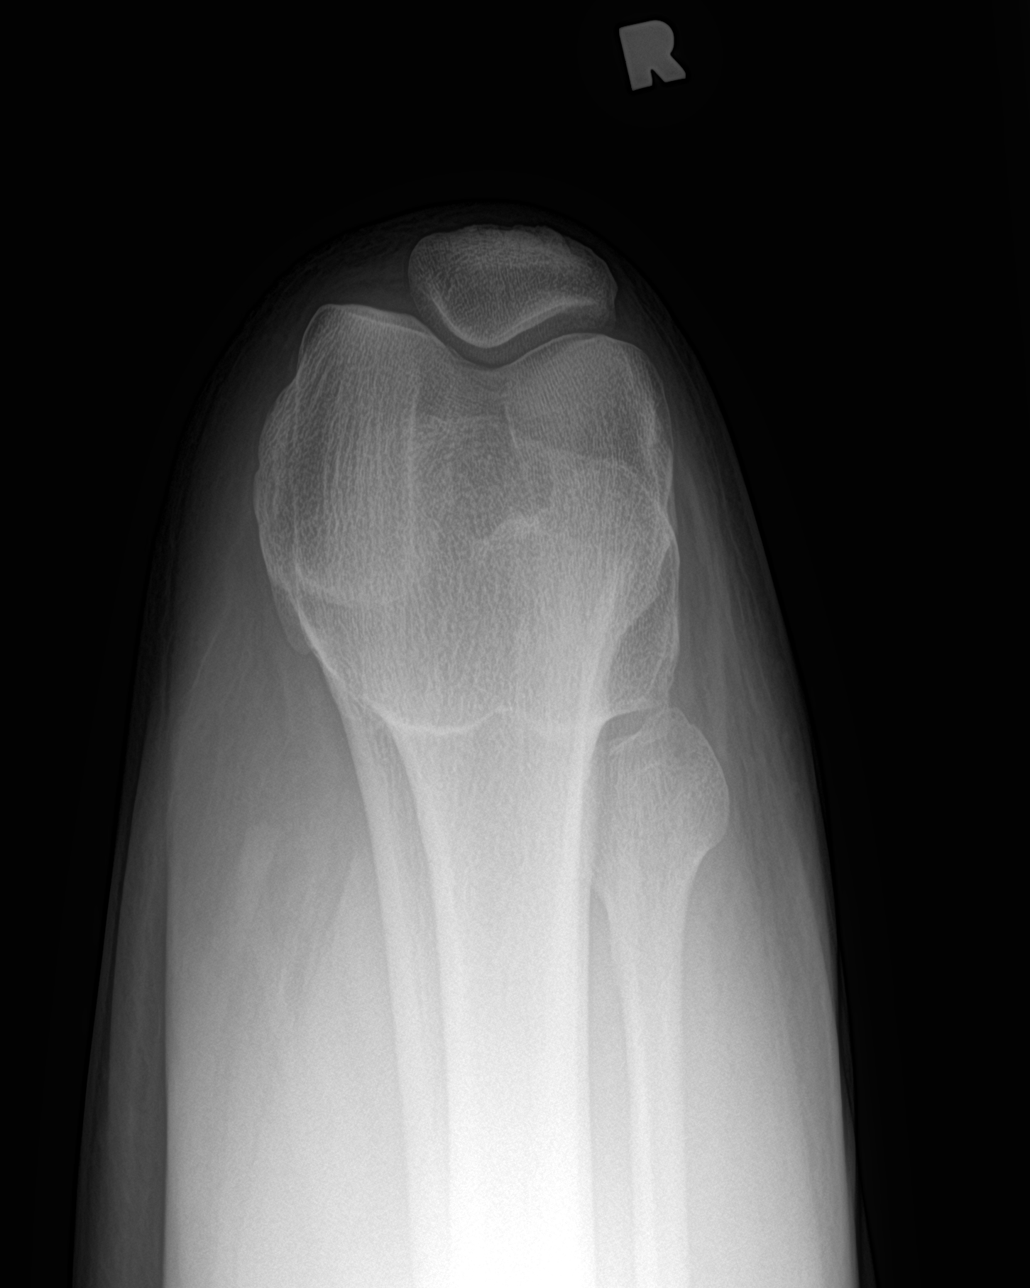

[knee ap bilat standing (2 of 2)]
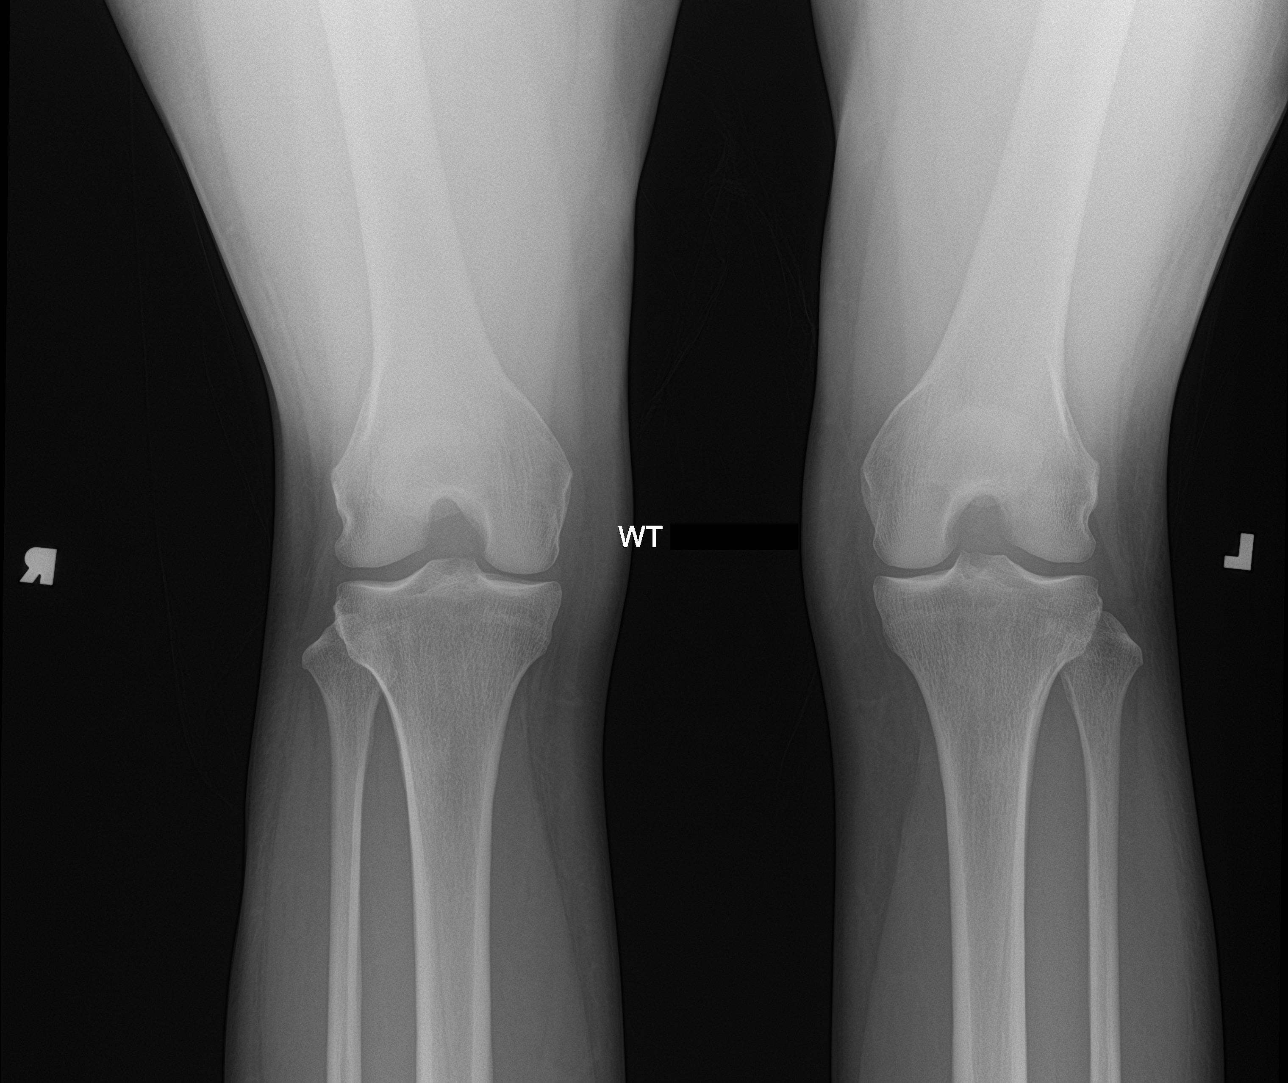

[4 of 4 positions shown; findings below may reference images not displayed]

FINDINGS: No evidence of fracture, dislocation, or joint effusion. No evidence
of arthropathy or other focal bone abnormality. Soft tissues are
unremarkable.
IMPRESSION: Negative.

## 2021-08-12 NOTE — Telephone Encounter (Signed)
Chronic pain of right knee Persistent right knee pain localized anteriorly at the medial femoral condyle, has had greater than 6 weeks conditioning, NSAIDs, question medial meniscal tear, cartilage loss/OCD medial femoral condyle. Adding x-rays, MRI.

## 2021-08-12 NOTE — Assessment & Plan Note (Signed)
Persistent right knee pain localized anteriorly at the medial femoral condyle, has had greater than 6 weeks conditioning, NSAIDs, question medial meniscal tear, cartilage loss/OCD medial femoral condyle. Adding x-rays, MRI.

## 2021-08-15 ENCOUNTER — Other Ambulatory Visit: Payer: No Typology Code available for payment source

## 2021-08-18 ENCOUNTER — Ambulatory Visit (INDEPENDENT_AMBULATORY_CARE_PROVIDER_SITE_OTHER): Payer: No Typology Code available for payment source

## 2021-08-18 DIAGNOSIS — M25561 Pain in right knee: Secondary | ICD-10-CM | POA: Diagnosis not present

## 2021-08-18 DIAGNOSIS — G8929 Other chronic pain: Secondary | ICD-10-CM | POA: Diagnosis not present

## 2021-08-18 IMAGING — MR MR KNEE*R* W/O CM
7 series · 40 of 40 positions shown · non-contrast
Comparison: Radiograph [DATE]

CLINICAL DATA: Meniscal injury, knee Chronic right knee pain,
medial meniscal tear versus cartilage loss medial femoral condyle

EXAM:
MRI OF THE RIGHT KNEE WITHOUT CONTRAST
TECHNIQUE: Multiplanar, multisequence MR imaging of the knee was performed. No
intravenous contrast was administered.

[Series 3: T2 fat-sat · axial · 4.0mm · 0.62mm/px · z∈[-92,+78]mm · 8 of 35 slices shown (1 of 3)]
[im 1/35]
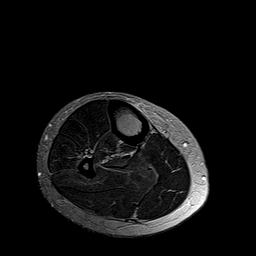
[im 5/35]
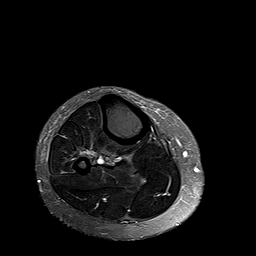
[im 10/35]
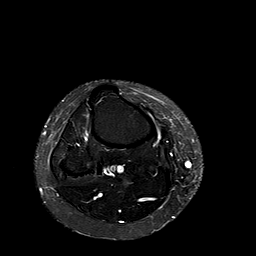
[im 15/35]
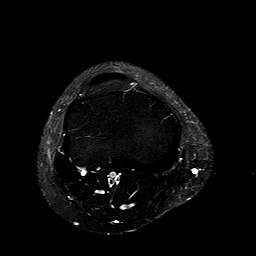
[im 20/35]
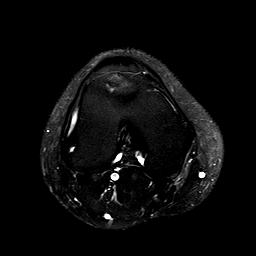
[im 25/35]
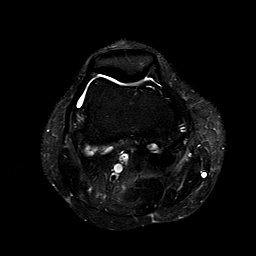
[im 30/35]
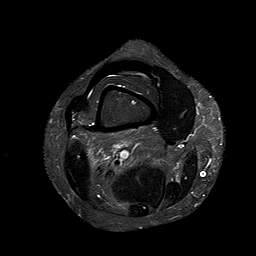
[im 35/35]
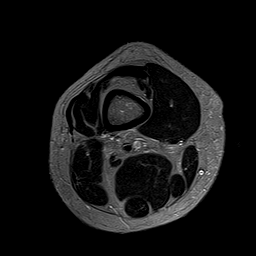

[Series 4: T1 · coronal · 4.0mm · 0.59mm/px · 6 of 25 slices shown]
[im 1/25]
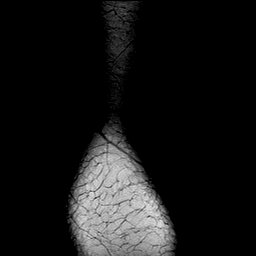
[im 5/25]
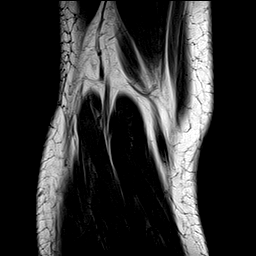
[im 10/25]
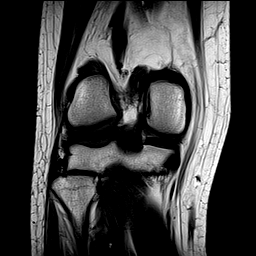
[im 15/25]
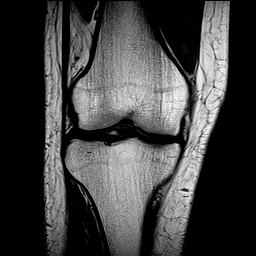
[im 20/25]
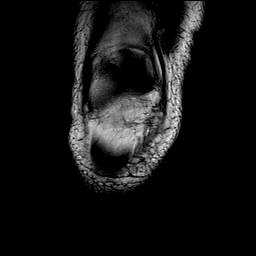
[im 25/25]
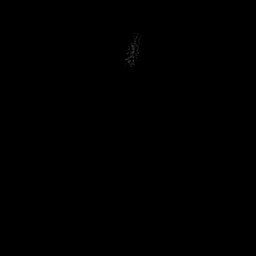

[Series 5: T2 fat-sat · coronal · 4.0mm · 0.59mm/px · 5 of 25 slices shown (2 of 3)]
[im 1/25]
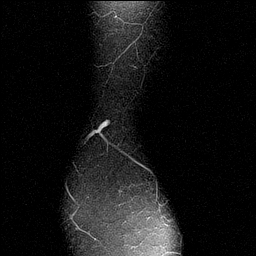
[im 7/25]
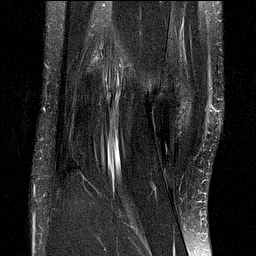
[im 13/25]
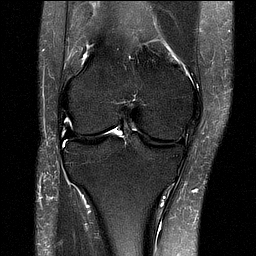
[im 19/25]
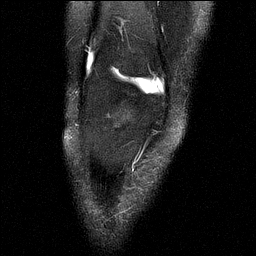
[im 25/25]
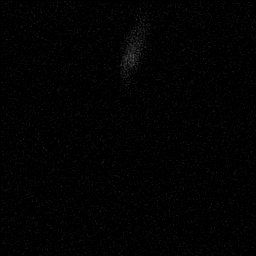

[Series 6: PD fat-sat · coronal · 4.0mm · 0.59mm/px · 5 of 25 slices shown (1 of 3)]
[im 1/25]
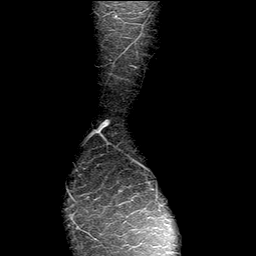
[im 7/25]
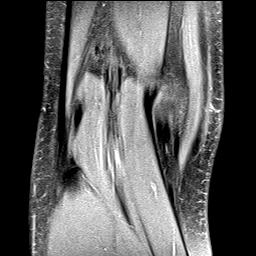
[im 13/25]
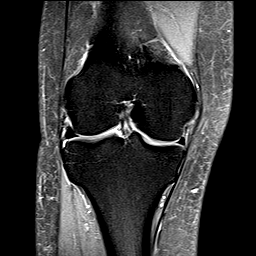
[im 19/25]
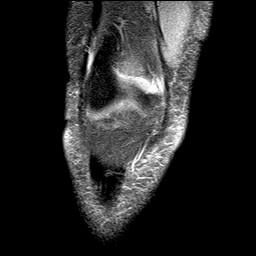
[im 25/25]
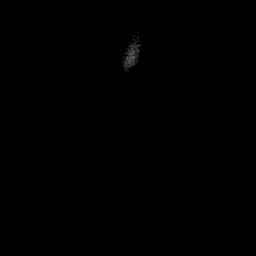

[Series 7: PD fat-sat · sagittal · 3.0mm · 0.59mm/px · 6 of 29 slices shown (2 of 3)]
[im 1/29]
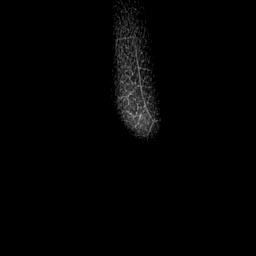
[im 6/29]
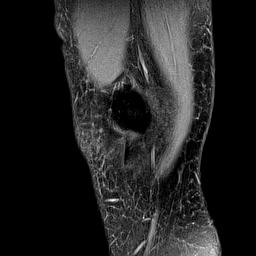
[im 12/29]
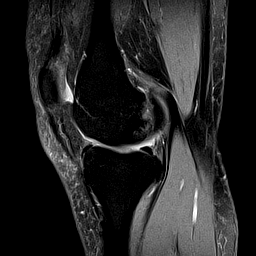
[im 17/29]
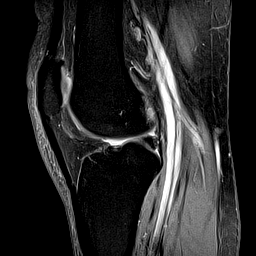
[im 23/29]
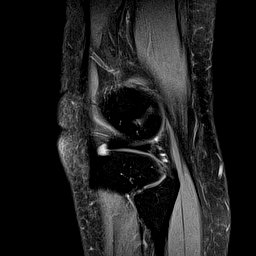
[im 29/29]
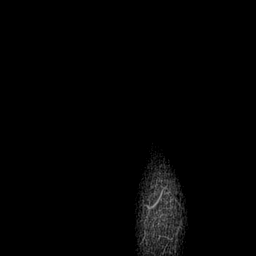

[Series 8: T2 fat-sat · sagittal · 3.0mm · 0.59mm/px · 6 of 30 slices shown (3 of 3)]
[im 1/30]
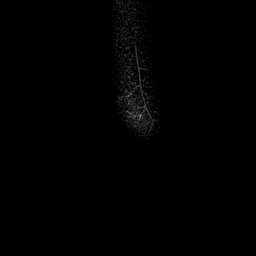
[im 6/30]
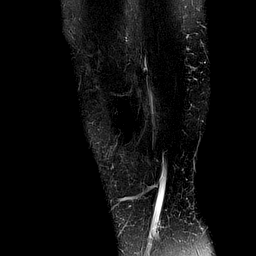
[im 12/30]
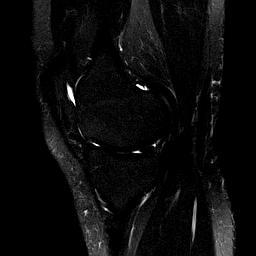
[im 18/30]
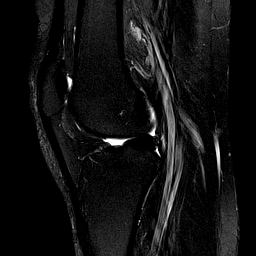
[im 24/30]
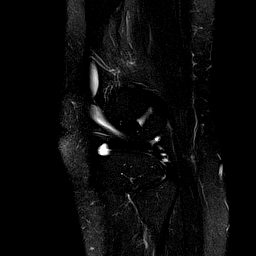
[im 30/30]
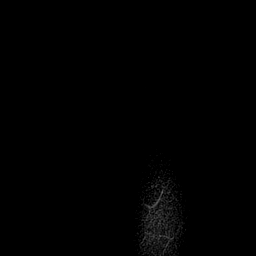

[Series 9: PD fat-sat · oblique · 2.0mm · 0.59mm/px · 4 of 19 slices shown (3 of 3)]
[im 1/19]
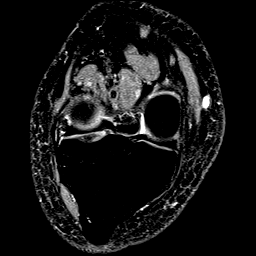
[im 7/19]
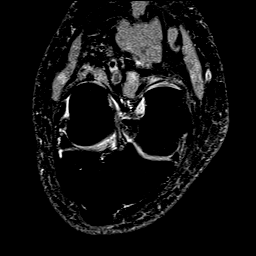
[im 13/19]
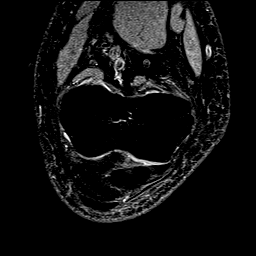
[im 19/19]
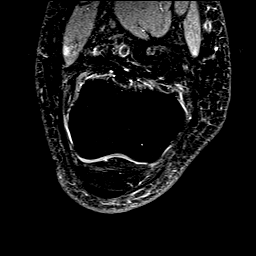

[40 of 40 positions shown; findings below may reference images not displayed]

FINDINGS: MENISCI

Medial: Intact.

Lateral: Intact.

LIGAMENTS

Cruciates: ACL and PCL are intact.

Collaterals: Medial collateral ligament is intact. Lateral
collateral ligament complex is intact.

CARTILAGE

Patellofemoral:  No chondral defect.

Medial: Mild partial-thickness cartilage loss along the
weight-bearing surfaces. No focal defect.

Lateral:  Mild chondrosis.  No focal defect.

JOINT: Small joint effusion.

POPLITEAL FOSSA: Meniscal Baker's cyst.

EXTENSOR MECHANISM: Intact quadriceps tendon. Intact patellar
tendon.

BONES: No acute fracture. No aggressive osseous lesion. No focal
contusion.

Other: No fluid collection or hematoma. Muscles are normal.
IMPRESSION: No evidence of meniscus tear or ligamentous injury.

Mild partial-thickness cartilage loss in the medial compartment.
Mild lateral compartment chondrosis.

Small joint effusion.

## 2021-08-19 ENCOUNTER — Other Ambulatory Visit (HOSPITAL_BASED_OUTPATIENT_CLINIC_OR_DEPARTMENT_OTHER): Payer: Self-pay

## 2021-08-19 MED ORDER — XIIDRA 5 % OP SOLN
OPHTHALMIC | 3 refills | Status: DC
  Filled 2021-08-19: qty 60, 30d supply, fill #0
  Filled 2021-08-19: qty 120, 60d supply, fill #0

## 2021-08-20 ENCOUNTER — Other Ambulatory Visit (HOSPITAL_COMMUNITY): Payer: Self-pay

## 2021-08-20 ENCOUNTER — Other Ambulatory Visit: Payer: Self-pay | Admitting: *Deleted

## 2021-08-20 ENCOUNTER — Other Ambulatory Visit (HOSPITAL_BASED_OUTPATIENT_CLINIC_OR_DEPARTMENT_OTHER): Payer: Self-pay

## 2021-08-20 ENCOUNTER — Telehealth: Payer: Self-pay | Admitting: Family Medicine

## 2021-08-20 ENCOUNTER — Telehealth: Payer: Self-pay | Admitting: *Deleted

## 2021-08-20 DIAGNOSIS — F902 Attention-deficit hyperactivity disorder, combined type: Secondary | ICD-10-CM

## 2021-08-20 MED ORDER — AMPHETAMINE-DEXTROAMPHET ER 10 MG PO CP24
10.0000 mg | ORAL_CAPSULE | Freq: Every morning | ORAL | 0 refills | Status: DC
  Filled 2021-08-20: qty 30, 30d supply, fill #0

## 2021-08-20 NOTE — Telephone Encounter (Signed)
Due for RFon Adderall.  Noticing some help with focus. Feel it make her chatty.  She does feel it wear off probably around 2:58 in the afternoon and most feels more tired afterwards like she experiences a "crash".  But no chest pain shortness of breath.  It has not impacted her sleep quality.  We discussed options including trying to increase her dose versus continuing the 10 mg and just adding a short acting in the afternoon.  We will opt for a trial of 15 mg for 30 days and if that is not effective then we will go back down to 10 and add a short acting in the afternoon.  Beatrice Lecher, MD

## 2021-08-20 NOTE — Telephone Encounter (Signed)
Pt asked that xiidra be added to her med list

## 2021-08-25 ENCOUNTER — Other Ambulatory Visit (HOSPITAL_BASED_OUTPATIENT_CLINIC_OR_DEPARTMENT_OTHER): Payer: Self-pay

## 2021-08-25 ENCOUNTER — Other Ambulatory Visit: Payer: Self-pay | Admitting: Family Medicine

## 2021-08-25 DIAGNOSIS — F902 Attention-deficit hyperactivity disorder, combined type: Secondary | ICD-10-CM

## 2021-08-25 MED ORDER — AMPHETAMINE-DEXTROAMPHET ER 15 MG PO CP24
15.0000 mg | ORAL_CAPSULE | Freq: Every morning | ORAL | 0 refills | Status: DC
  Filled 2021-08-25: qty 30, 30d supply, fill #0

## 2021-08-25 NOTE — Progress Notes (Signed)
Meds ordered this encounter  Medications   amphetamine-dextroamphetamine (ADDERALL XR) 15 MG 24 hr capsule    Sig: Take 1 capsule by mouth in the morning.    Dispense:  30 capsule    Refill:  0

## 2021-09-03 ENCOUNTER — Ambulatory Visit (INDEPENDENT_AMBULATORY_CARE_PROVIDER_SITE_OTHER): Payer: No Typology Code available for payment source

## 2021-09-03 DIAGNOSIS — Z1231 Encounter for screening mammogram for malignant neoplasm of breast: Secondary | ICD-10-CM | POA: Diagnosis not present

## 2021-09-03 IMAGING — MG DIGITAL SCREENING BREAST BILAT IMPLANT W/ TOMO W/ CAD
8 of 12 series · 8 of 28 positions shown · non-contrast
Comparison: Previous exam(s).

CLINICAL DATA: Screening.

EXAM:
DIGITAL SCREENING BILATERAL MAMMOGRAM WITH IMPLANTS, CAD AND
TOMOSYNTHESIS
TECHNIQUE: Bilateral screening digital craniocaudal and mediolateral oblique
mammograms were obtained. Bilateral screening digital breast
tomosynthesis was performed. The images were evaluated with
computer-aided detection. Standard and/or implant displaced views
were performed.

[R MLO]
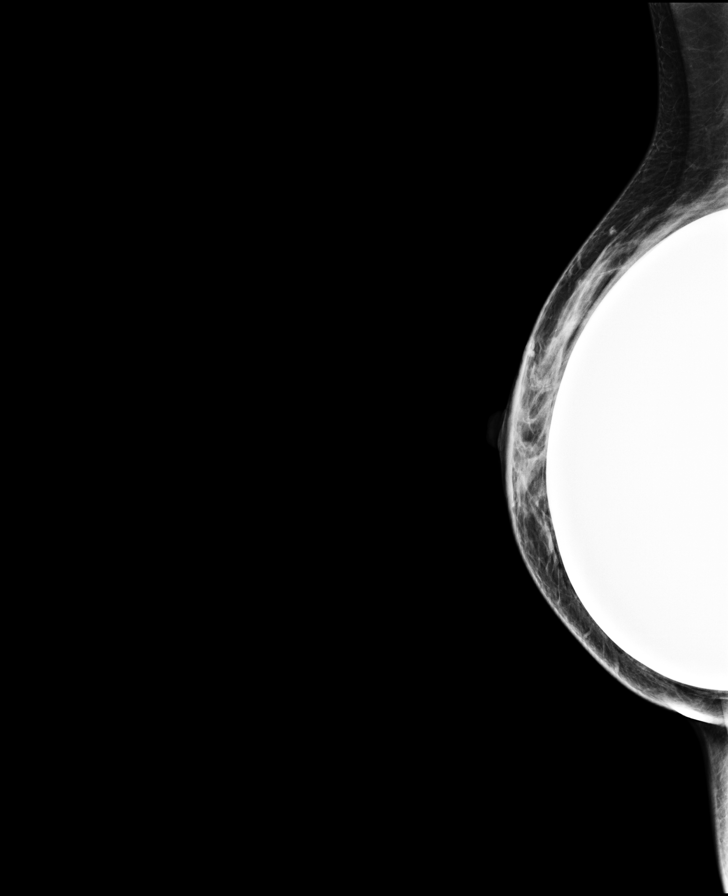

[R CC]
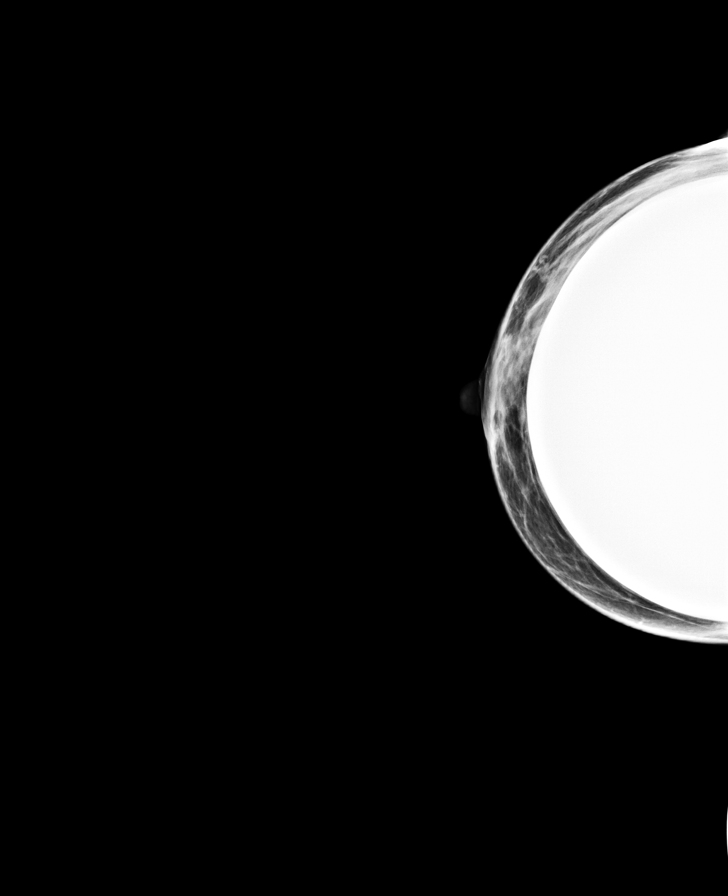

[L CC]
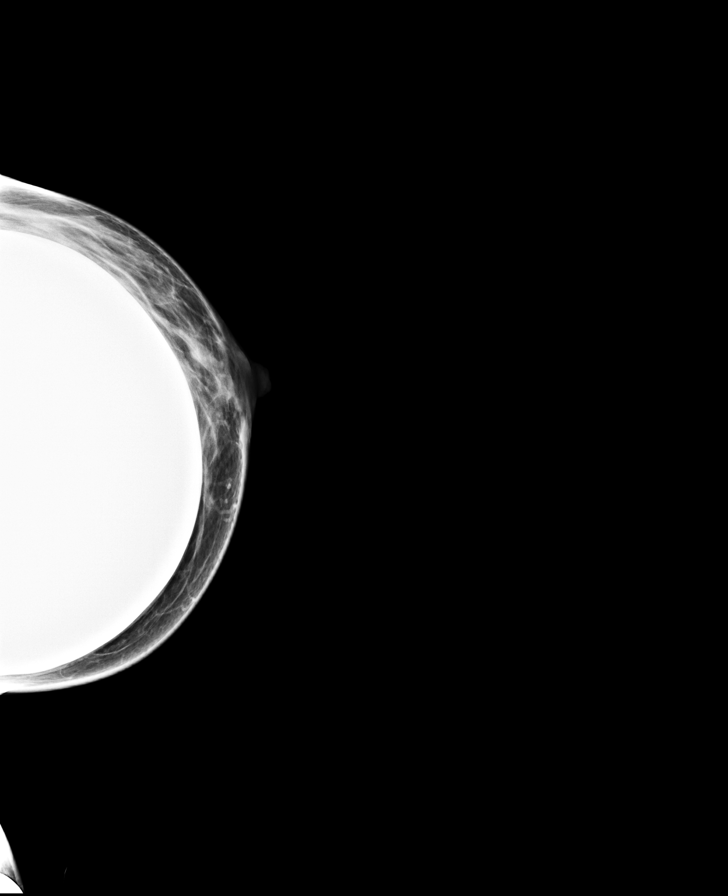

[L MLO]
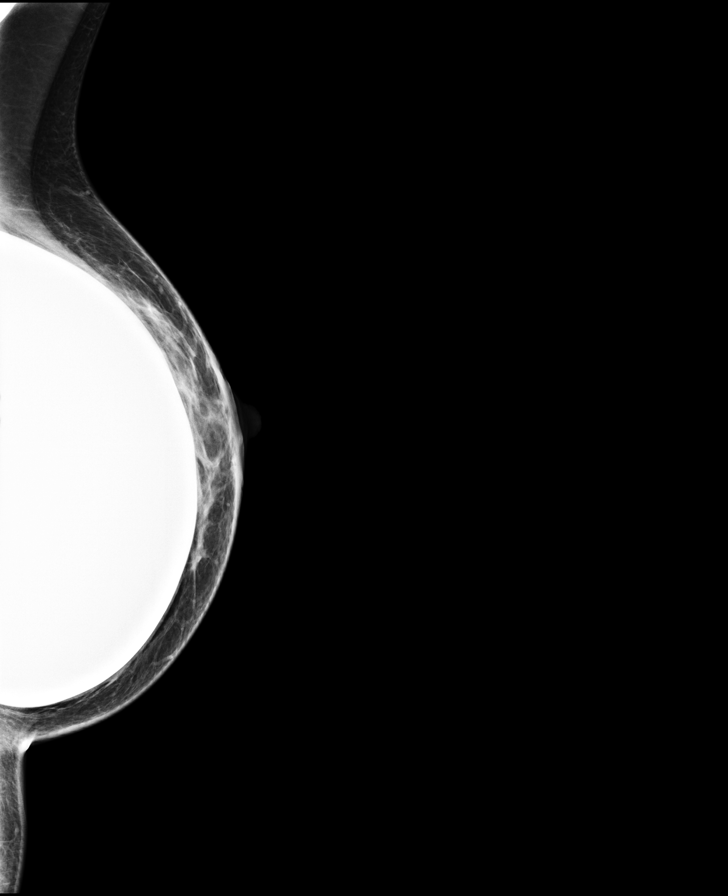

[L CC synth-2D]
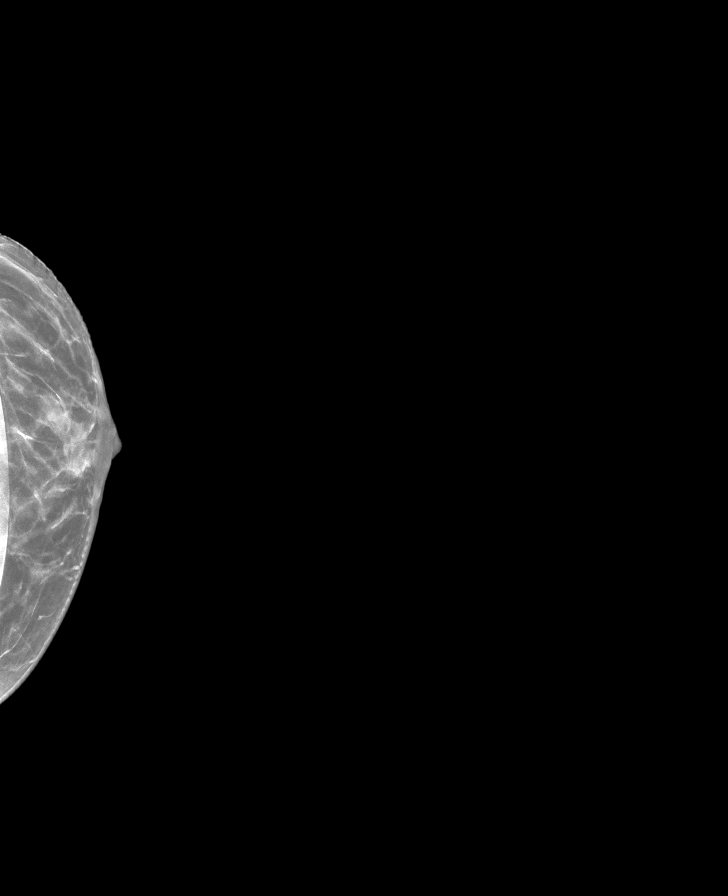

[R CC synth-2D]
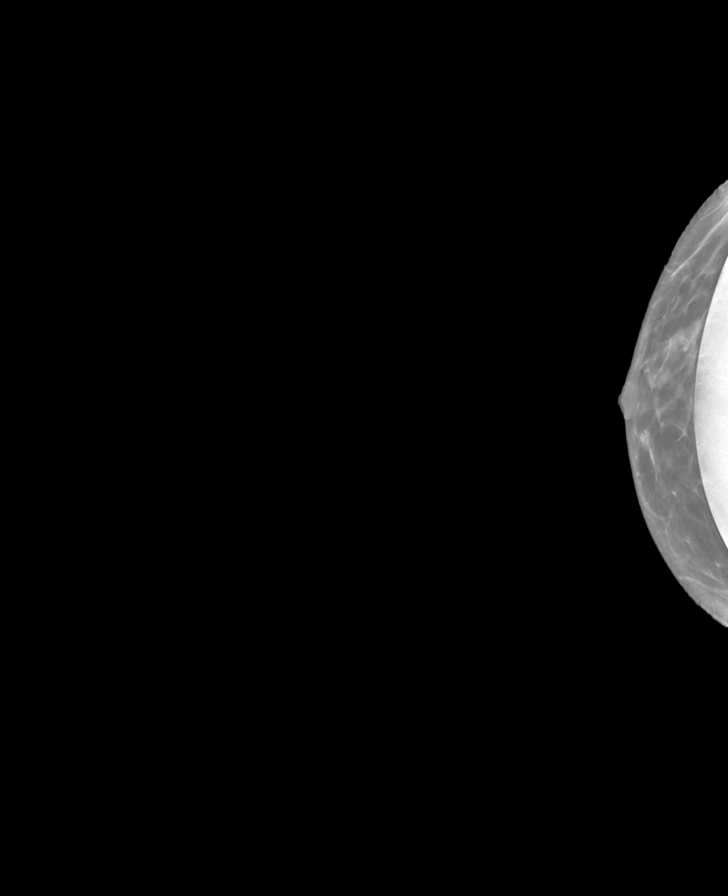

[L MLO synth-2D]
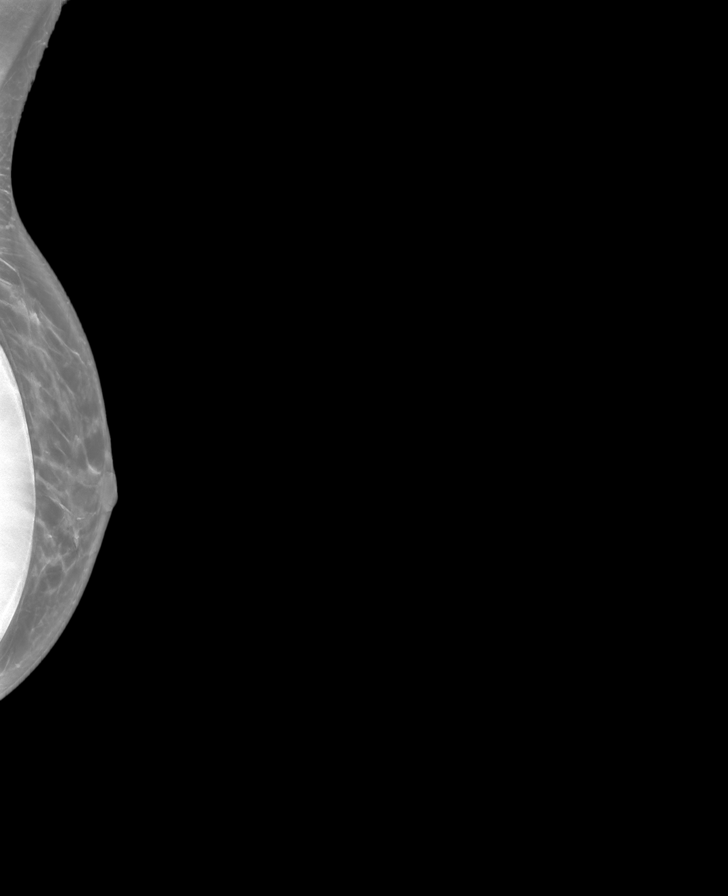

[R MLO synth-2D]
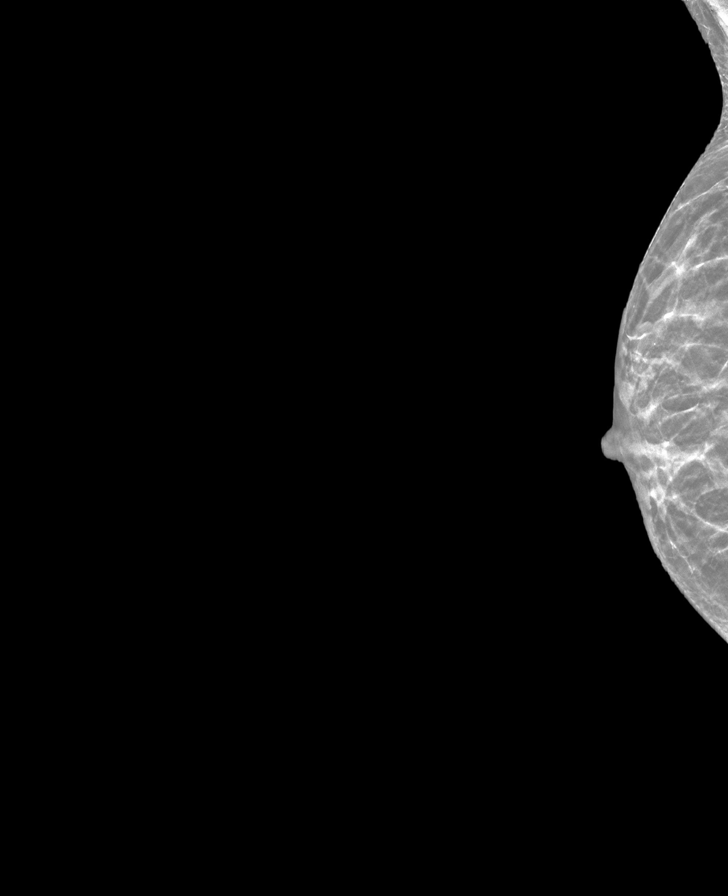

[8 of 28 positions shown; findings below may reference images not displayed]

ACR Breast Density Category b: There are scattered areas of
fibroglandular density.
FINDINGS: The patient has implants.

In the left breast, a possible asymmetry warrants further
evaluation. In the right breast, no suspicious masses or malignant
type calcifications are identified.
IMPRESSION: Further evaluation is suggested for possible asymmetry in the left
breast.

RECOMMENDATION:
Diagnostic mammogram and possibly ultrasound of the left breast.
(Code:[0P])

The patient will be contacted regarding the findings, and additional
imaging will be scheduled.

BI-RADS CATEGORY  0: Incomplete. Need additional imaging evaluation
and/or prior mammograms for comparison.

## 2021-09-07 ENCOUNTER — Other Ambulatory Visit: Payer: Self-pay | Admitting: Family Medicine

## 2021-09-07 ENCOUNTER — Other Ambulatory Visit (HOSPITAL_BASED_OUTPATIENT_CLINIC_OR_DEPARTMENT_OTHER): Payer: Self-pay

## 2021-09-07 DIAGNOSIS — R928 Other abnormal and inconclusive findings on diagnostic imaging of breast: Secondary | ICD-10-CM

## 2021-09-09 ENCOUNTER — Telehealth: Payer: Self-pay | Admitting: Family Medicine

## 2021-09-09 ENCOUNTER — Other Ambulatory Visit (HOSPITAL_BASED_OUTPATIENT_CLINIC_OR_DEPARTMENT_OTHER): Payer: Self-pay

## 2021-09-09 ENCOUNTER — Ambulatory Visit: Payer: No Typology Code available for payment source

## 2021-09-09 ENCOUNTER — Other Ambulatory Visit: Payer: Self-pay | Admitting: Family Medicine

## 2021-09-09 ENCOUNTER — Ambulatory Visit
Admission: RE | Admit: 2021-09-09 | Discharge: 2021-09-09 | Disposition: A | Discharge status: Home / Self Care | Payer: No Typology Code available for payment source | Source: Ambulatory Visit | Attending: Family Medicine | Admitting: Family Medicine

## 2021-09-09 DIAGNOSIS — F5101 Primary insomnia: Secondary | ICD-10-CM | POA: Insufficient documentation

## 2021-09-09 DIAGNOSIS — R928 Other abnormal and inconclusive findings on diagnostic imaging of breast: Secondary | ICD-10-CM

## 2021-09-09 DIAGNOSIS — Z1211 Encounter for screening for malignant neoplasm of colon: Secondary | ICD-10-CM

## 2021-09-09 MED ORDER — TRAZODONE HCL 50 MG PO TABS
25.0000 mg | ORAL_TABLET | Freq: Every day | ORAL | 3 refills | Status: DC
  Filled 2021-09-09: qty 60, 30d supply, fill #0
  Filled 2021-10-04: qty 60, 30d supply, fill #1
  Filled 2021-11-06: qty 60, 30d supply, fill #2
  Filled 2021-12-27: qty 60, 30d supply, fill #3

## 2021-09-09 NOTE — Telephone Encounter (Signed)
Patient called and would like to go ahead and move forward with colon cancer screening since she is on 27.  Cologuard ordered. Also, still struggling with sleep.  Has been an ongoing issue for years but starting to impact her ability to work.

## 2021-09-11 LAB — TSH: TSH: 3.01 mIU/L

## 2021-09-11 NOTE — Progress Notes (Signed)
Hi Necole, looks pretty good.  We could always try to push it a little bit lower if you want depending on how you feel.  But I would recommend to leave it where it is and then check it again in 3 months.

## 2021-09-15 ENCOUNTER — Other Ambulatory Visit: Payer: Self-pay | Admitting: Family Medicine

## 2021-09-15 DIAGNOSIS — M542 Cervicalgia: Secondary | ICD-10-CM

## 2021-09-16 ENCOUNTER — Other Ambulatory Visit (HOSPITAL_COMMUNITY): Payer: Self-pay

## 2021-09-16 MED ORDER — PREGABALIN 100 MG PO CAPS
100.0000 mg | ORAL_CAPSULE | Freq: Two times a day (BID) | ORAL | 1 refills | Status: DC
  Filled 2021-09-16: qty 180, 90d supply, fill #0

## 2021-09-23 ENCOUNTER — Telehealth: Payer: Self-pay | Admitting: Family Medicine

## 2021-09-23 DIAGNOSIS — I959 Hypotension, unspecified: Secondary | ICD-10-CM

## 2021-09-23 DIAGNOSIS — R001 Bradycardia, unspecified: Secondary | ICD-10-CM

## 2021-09-23 NOTE — Telephone Encounter (Signed)
April Webster reports feeling near syncopal.  Blood pressures have been running low despite trying to significantly increase salt intake.  EKG performed today showing rate of 49 bpm sinus bradycardia with no significant abnormalities.  We will go ahead and place referral to cardiology for further evaluation.  Orders Placed This Encounter  Procedures   Ambulatory referral to Cardiology    Referral Priority:   Routine    Referral Type:   Consultation    Referral Reason:   Specialty Services Required    Requested Specialty:   Cardiology    Number of Visits Requested:   1

## 2021-09-28 ENCOUNTER — Encounter: Payer: Self-pay | Admitting: Family Medicine

## 2021-10-04 LAB — COLOGUARD: COLOGUARD: NEGATIVE

## 2021-10-05 ENCOUNTER — Other Ambulatory Visit (HOSPITAL_COMMUNITY): Payer: Self-pay

## 2021-10-05 NOTE — Progress Notes (Signed)
Great news! Your Cologuard test is negative.  Recommend repeat colon cancer screening in 3 years.

## 2021-10-07 ENCOUNTER — Ambulatory Visit (INDEPENDENT_AMBULATORY_CARE_PROVIDER_SITE_OTHER): Payer: No Typology Code available for payment source | Admitting: Family Medicine

## 2021-10-07 ENCOUNTER — Encounter: Payer: Self-pay | Admitting: Family Medicine

## 2021-10-07 ENCOUNTER — Other Ambulatory Visit (HOSPITAL_COMMUNITY): Payer: Self-pay

## 2021-10-07 ENCOUNTER — Other Ambulatory Visit: Payer: Self-pay | Admitting: Family Medicine

## 2021-10-07 DIAGNOSIS — N898 Other specified noninflammatory disorders of vagina: Secondary | ICD-10-CM

## 2021-10-07 DIAGNOSIS — R102 Pelvic and perineal pain: Secondary | ICD-10-CM

## 2021-10-07 DIAGNOSIS — R300 Dysuria: Secondary | ICD-10-CM

## 2021-10-07 DIAGNOSIS — K5909 Other constipation: Secondary | ICD-10-CM | POA: Diagnosis not present

## 2021-10-07 DIAGNOSIS — F902 Attention-deficit hyperactivity disorder, combined type: Secondary | ICD-10-CM

## 2021-10-07 LAB — POCT URINALYSIS DIP (CLINITEK)
Bilirubin, UA: NEGATIVE
Blood, UA: NEGATIVE
Glucose, UA: NEGATIVE mg/dL
Ketones, POC UA: NEGATIVE mg/dL
Leukocytes, UA: NEGATIVE
Nitrite, UA: NEGATIVE
POC PROTEIN,UA: NEGATIVE
Spec Grav, UA: <=1.005 — AB (ref 1.010–1.025)
Urobilinogen, UA: 0.2 E.U./dL
pH, UA: 5.5 (ref 5.0–8.0)

## 2021-10-07 MED ORDER — LUBIPROSTONE 24 MCG PO CAPS
24.0000 ug | ORAL_CAPSULE | Freq: Two times a day (BID) | ORAL | 1 refills | Status: DC
  Filled 2021-10-07: qty 180, 90d supply, fill #0
  Filled 2022-02-03: qty 180, 90d supply, fill #1

## 2021-10-07 NOTE — Assessment & Plan Note (Signed)
We will switch to Amitiza.  New prescription sent to [facility].

## 2021-10-07 NOTE — Progress Notes (Signed)
Pt reports burning w/urination pelvic pressure x 2 days.  No blood in urine. No OTC meds.

## 2021-10-07 NOTE — Progress Notes (Signed)
Acute Office Visit  Subjective:     Patient ID: April Webster, female    DOB: 1975-10-09, 46 y.o.   MRN: [id number]  Chief Complaint  Patient presents with   pelvic pressure   burning with urination    HPI Patient is in today for pelvic pressure and some dysuria with burning sensation for about 2 days.  She has had intermittent burning over the years with interstitial cystitis.  No hematuria.  No fevers or chills.  Also complains of a vaginal odor suspect that she probably has BV.  She has had recurrence of this before.  She has been working on exercising and sweating regularly.  She does not currently take a probiotic.  She still struggles with her chronic constipation as well and more recently her Linzess has not been helpful.  It helped initially but gradually has become less and less effective.  She follows with Dr. Barron Alvine.  ROS      Objective:    There were no vitals taken for this visit.   Physical Exam Vitals reviewed.  Constitutional:      Appearance: She is well-developed.  HENT:     Head: Normocephalic and atraumatic.  Eyes:     Conjunctiva/sclera: Conjunctivae normal.  Cardiovascular:     Rate and Rhythm: Normal rate.  Pulmonary:     Effort: Pulmonary effort is normal.  Abdominal:     General: There is no distension.     Palpations: Abdomen is soft. There is no mass.     Tenderness: There is no guarding.     Comments: Mildly tender in the left lower quadrant.  Skin:    General: Skin is dry.     Coloration: Skin is not pale.  Neurological:     Mental Status: She is alert and oriented to person, place, and time.  Psychiatric:        Behavior: Behavior normal.     Results for orders placed or performed in visit on 10/07/21  POCT URINALYSIS DIP (CLINITEK)  Result Value Ref Range   Color, UA light yellow (A) yellow   Clarity, UA cloudy (A) clear   Glucose, UA negative negative mg/dL   Bilirubin, UA negative negative   Ketones, POC UA negative  negative mg/dL   Spec Grav, UA <=1.517 (A) 1.010 - 1.025   Blood, UA negative negative   pH, UA 5.5 5.0 - 8.0   POC PROTEIN,UA negative negative, trace   Urobilinogen, UA 0.2 0.2 or 1.0 E.U./dL   Nitrite, UA Negative Negative   Leukocytes, UA Negative Negative        Assessment & Plan:   Problem List Items Addressed This Visit       Digestive   Chronic constipation    We will switch to Amitiza.  New prescription sent to [facility].      Relevant Medications   lubiprostone (AMITIZA) 24 MCG capsule   Other Visit Diagnoses     Burning with urination    -  Primary   Relevant Orders   POCT URINALYSIS DIP (CLINITEK) (Completed)   WET PREP FOR TRICH, YEAST, CLUE   Urine Culture   Pelvic pressure in female       Relevant Orders   POCT URINALYSIS DIP (CLINITEK) (Completed)   WET PREP FOR TRICH, YEAST, CLUE   Vaginal odor          Dysuria-urinalysis appears to be negative but will send for culture.  Possibly just a flare with her  IC.  She does not feel like there are any specific triggers currently such as change in diet etc.  Vaginal odor-we will do stat wet prep.  Meds ordered this encounter  Medications   lubiprostone (AMITIZA) 24 MCG capsule    Sig: Take 1 capsule (24 mcg total) by mouth 2 (two) times daily with a meal.    Dispense:  180 capsule    Refill:  1    Please deliver    Return if symptoms worsen or fail to improve.  Nani Gasser, MD

## 2021-10-08 ENCOUNTER — Other Ambulatory Visit (HOSPITAL_COMMUNITY): Payer: Self-pay

## 2021-10-08 LAB — WET PREP FOR TRICH, YEAST, CLUE
MICRO NUMBER:: 13701701
Specimen Quality: ADEQUATE

## 2021-10-08 MED ORDER — AMPHETAMINE-DEXTROAMPHET ER 15 MG PO CP24
15.0000 mg | ORAL_CAPSULE | Freq: Every morning | ORAL | 0 refills | Status: DC
  Filled 2021-10-08: qty 30, 30d supply, fill #0

## 2021-10-08 MED ORDER — FLUCONAZOLE 150 MG PO TABS: 150.0000 mg | ORAL_TABLET | Freq: Every day | ORAL | 2 refills | Status: DC

## 2021-10-08 NOTE — Progress Notes (Signed)
Hi Ambry, swab showing yeast.  I am sending over Diflucan to [facility].  I am not sure where you are at that he so I figured I would just send it closer to home I suspect she would want to pick it up today as soon as possible and get started.

## 2021-10-08 NOTE — Addendum Note (Signed)
Addended by: Nani Gasser D on: 10/08/2021 07:55 AM   Modules accepted: Orders

## 2021-10-09 LAB — URINE CULTURE
MICRO NUMBER:: 13701704
SPECIMEN QUALITY:: ADEQUATE

## 2021-10-09 NOTE — Progress Notes (Signed)
Urine culture is negative

## 2021-10-14 ENCOUNTER — Other Ambulatory Visit: Payer: Self-pay | Admitting: Family Medicine

## 2021-10-14 ENCOUNTER — Other Ambulatory Visit (HOSPITAL_COMMUNITY): Payer: Self-pay

## 2021-10-14 MED ORDER — SYNTHROID 125 MCG PO TABS
125.0000 ug | ORAL_TABLET | Freq: Every day | ORAL | 0 refills | Status: DC
  Filled 2021-10-14: qty 90, 90d supply, fill #0

## 2021-10-21 ENCOUNTER — Other Ambulatory Visit: Payer: Self-pay

## 2021-10-21 ENCOUNTER — Other Ambulatory Visit (HOSPITAL_COMMUNITY): Payer: Self-pay

## 2021-10-21 ENCOUNTER — Ambulatory Visit (INDEPENDENT_AMBULATORY_CARE_PROVIDER_SITE_OTHER): Payer: No Typology Code available for payment source | Admitting: Cardiovascular Disease

## 2021-10-21 ENCOUNTER — Encounter: Payer: Self-pay | Admitting: Cardiovascular Disease

## 2021-10-21 VITALS — BP 120/70 | HR 84 | Ht 63.0 in | Wt 130.6 lb

## 2021-10-21 DIAGNOSIS — I951 Orthostatic hypotension: Secondary | ICD-10-CM

## 2021-10-21 DIAGNOSIS — E782 Mixed hyperlipidemia: Secondary | ICD-10-CM | POA: Diagnosis not present

## 2021-10-21 MED ORDER — FLUDROCORTISONE 0.1 MG/ML ORAL SUSPENSION
0.1000 mg | Freq: Every day | ORAL | 11 refills | Status: DC
  Filled 2021-10-21: qty 30, 30d supply, fill #0

## 2021-10-21 MED ORDER — FLUDROCORTISONE ACETATE 0.1 MG PO TABS
0.1000 mg | ORAL_TABLET | Freq: Every day | ORAL | 11 refills | Status: DC
  Filled 2021-10-21: qty 30, 30d supply, fill #0

## 2021-10-21 NOTE — Progress Notes (Unsigned)
Cardiology Office Note:    Date:  10/21/2021   ID:  April Webster, DOB [birth year], MRN [id number]  PCP:  Agapito Games, MD   [facility] Providers Cardiologist:  None { Click to update primary MD,subspecialty MD or APP then REFRESH:1}    Referring MD: Agapito Games, *   Chief Complaint  Patient presents with   Hypotension   Palpitations   Hyperlipidemia  ***  History of Present Illness:    April Webster is a 46 y.o. female with a hx of hypotension  Has had HTN in the past Did not tolerate Lisinopril ( hypotension) Attributed her HTN to oral contraceptives.   Took losartan / HCTZ for a while  Last year,  started working out, better diet 155 lbs to 125 lbs  Has raynauds syndrome  Tried amlodipie and nifidipine   Has periodic episodes of hypotension  Has chronically low BP ( unless  she is stressed )   Diet is good  Hydrates well     Past Medical History:  Diagnosis Date   Chronic idiopathic constipation    DDD (degenerative disc disease), cervical and lumbar    Female pattern hair loss    Graves disease    Hyperlipidemia    Hypertension    Lichen sclerosus    Raynaud's disease     Past Surgical History:  Procedure Laterality Date   ABDOMINOPLASTY     AUGMENTATION MAMMAPLASTY Bilateral 05/2019   Silicone    PLACEMENT OF BREAST IMPLANTS     THYROID SURGERY      Current Medications: Current Meds  Medication Sig   albuterol (VENTOLIN HFA) 108 (90 Base) MCG/ACT inhaler Inhale 2 puffs into the lungs every 6 (six) hours as needed for wheezing or shortness of breath.   amphetamine-dextroamphetamine (ADDERALL XR) 15 MG 24 hr capsule Take 1 capsule by mouth in the morning.   atorvastatin (LIPITOR) 20 MG tablet Take 1 tablet (20 mg total) by mouth daily.   baclofen (LIORESAL) 10 MG tablet TAKE 1 TABLET  BY MOUTH AT BEDTIME.   clobetasol ointment (TEMOVATE) 0.05 % Apply 1 application topically daily as needed for rash. LIchen sclerosis    FIBER SELECT GUMMIES PO Take 2 each by mouth daily.   finasteride (PROSCAR) 5 MG tablet TAKE 1/2 TABLET (2.5 MG TOTAL) BY MOUTH DAILY.   levonorgestrel (MIRENA) 20 MCG/24HR IUD by Intrauterine route.   lubiprostone (AMITIZA) 24 MCG capsule Take 1 capsule (24 mcg total) by mouth 2 (two) times daily with a meal. (Patient taking differently: Take 24 mcg by mouth daily with breakfast.)   melatonin 5 MG TABS Take 5 mg by mouth at bedtime.   meloxicam (MOBIC) 15 MG tablet TAKE 1 TABLET (15 MG TOTAL) BY MOUTH DAILY.   pregabalin (LYRICA) 100 MG capsule Take 1 capsule by mouth 2 times daily.   SYNTHROID 125 MCG tablet Take 1 tablet (125 mcg total) by mouth daily.   traZODone (DESYREL) 50 MG tablet Take 1/2 to 2 tablets (25-100 mg total) by mouth at bedtime.     Allergies:   Cefuroxime axetil; Tuberculin purified protein derivative; Tuberculin, ppd; Ambien [zolpidem]; Cymbalta [duloxetine hcl]; Fluoxetine; Cefuroxime axetil; and Tape   Social History   Socioeconomic History   Marital status: Married    Spouse name: Not on file   Number of children: 3   Years of education: Not on file   Highest education level: Not on file  Occupational History   Occupation: NP at  [employer]  Tobacco Use   Smoking status: Former    Packs/day: 2.00    Years: 5.00    Total pack years: 10.00    Types: Cigarettes    Quit date: 03/15/1996    Years since quitting: 25.6   Smokeless tobacco: Never  Vaping Use   Vaping Use: Never used  Substance and Sexual Activity   Alcohol use: Not Currently    Alcohol/week: 2.0 standard drinks of alcohol    Types: 2 Glasses of wine per week   Drug use: Never   Sexual activity: Yes    Partners: Male    Birth control/protection: I.U.D.  Other Topics Concern   Not on file  Social History Narrative   Not on file   Social Determinants of Health   Financial Resource Strain: Not on file  Food Insecurity: Not on file  Transportation Needs: Not on file  Physical Activity:  Not on file  Stress: Not on file  Social Connections: Not on file     Family History: The patient's family history includes Heart attack in her maternal grandmother; Hyperlipidemia in her mother; Hypertension in her mother; Lung cancer in her father; Osteoporosis in her mother; Stomach cancer in her maternal grandfather. There is no history of Colon cancer, Rectal cancer, Liver cancer, Esophageal cancer, or Pancreatic cancer.  ROS:   Please see the history of present illness.     All other systems reviewed and are negative.  EKGs/Labs/Other Studies Reviewed:    The following studies were reviewed today:   EKG:     Recent Labs: 06/04/2021: BUN 19; Creat 0.76; Potassium 4.3; Sodium 139 07/22/2021: ALT 45; Hemoglobin 14.6; Platelets 223 09/10/2021: TSH 3.01  Recent Lipid Panel    Component Value Date/Time   CHOL 144 06/04/2021 0000   TRIG 51 06/04/2021 0000   HDL 58 06/04/2021 0000   CHOLHDL 2.5 06/04/2021 0000   LDLCALC 73 06/04/2021 0000     Risk Assessment/Calculations:   {Does this patient have ATRIAL FIBRILLATION?[phone]}       Physical Exam:    VS:  BP 120/70   Pulse 84   Ht 5\' 3"  (1.6 m)   Wt 130 lb 9.6 oz (59.2 kg)   SpO2 96%   BMI 23.13 kg/m     Wt Readings from Last 3 Encounters:  10/21/21 130 lb 9.6 oz (59.2 kg)  06/08/21 128 lb (58.1 kg)  06/04/21 132 lb (59.9 kg)     GEN: *** Well nourished, well developed in no acute distress HEENT: Normal NECK: No JVD; No carotid bruits LYMPHATICS: No lymphadenopathy CARDIAC: ***RRR, no murmurs, rubs, gallops RESPIRATORY:  Clear to auscultation without rales, wheezing or rhonchi  ABDOMEN: Soft, non-tender, non-distended MUSCULOSKELETAL:  No edema; No deformity  SKIN: Warm and dry NEUROLOGIC:  Alert and oriented x 3 PSYCHIATRIC:  Normal affect   ASSESSMENT:    No diagnosis found. PLAN:    In order of problems listed above:   Orthostatic hypotension:    Has been persistent , despite eating lots of  salt Hydrates well,  eats normal ,healthy meals ? Adrenal insufficiency / adrenal fatigue ( but electrolytes are normal   Echo shows normal LV function, trace MR, mild AI   Will try Florinef 0.1 mg a day .   Will have her start with Florinef 0.1 mg on M,W,F May need to try midodrine  Follow up with me in 2 months   2.  Hyperlipidemia:   consider coronary caclium score when I  see her back in the office in several months.  She is doing very well on atorvastatin.  {Are you ordering a CV Procedure (e.g. stress test, cath, DCCV, TEE, etc)?   Press F2        :[id number]}    Medication Adjustments/Labs and Tests Ordered: Current medicines are reviewed at length with the patient today.  Concerns regarding medicines are outlined above.  No orders of the defined types were placed in this encounter.  No orders of the defined types were placed in this encounter.   There are no Patient Instructions on file for this visit.   Signed, Kristeen Miss, MD  10/21/2021 3:17 PM    [facility]

## 2021-10-21 NOTE — Patient Instructions (Signed)
Medication Instructions:  Florinef 0.1mg  daily *If you need a refill on your cardiac medications before your next appointment, please call your pharmacy*  Lab Work: Bmet in 2 months If you have labs (blood work) drawn today and your tests are completely normal, you will receive your results only by: MyChart Message (if you have MyChart) OR A paper copy in the mail If you have any lab test that is abnormal or we need to change your treatment, we will call you to review the results.   Follow-Up: At [facility], you and your health needs are our priority.  As part of our continuing mission to provide you with exceptional heart care, we have created designated Provider Care Teams.  These Care Teams include your primary Cardiologist (physician) and Advanced Practice Providers (APPs -  Physician Assistants and Nurse Practitioners) who all work together to provide you with the care you need, when you need it.  We recommend signing up for the patient portal called "MyChart".  Sign up information is provided on this After Visit Summary.  MyChart is used to connect with patients for Virtual Visits (Telemedicine).  Patients are able to view lab/test results, encounter notes, upcoming appointments, etc.  Non-urgent messages can be sent to your provider as well.   To learn more about what you can do with MyChart, go to [email or web address].    Your next appointment:   2 month(s)  The format for your next appointment:   In Person  Provider:   Dr. Elease Hashimoto  }

## 2021-10-22 ENCOUNTER — Other Ambulatory Visit (HOSPITAL_COMMUNITY): Payer: Self-pay

## 2021-11-06 ENCOUNTER — Other Ambulatory Visit: Payer: Self-pay | Admitting: Family Medicine

## 2021-11-06 DIAGNOSIS — F902 Attention-deficit hyperactivity disorder, combined type: Secondary | ICD-10-CM

## 2021-11-07 ENCOUNTER — Other Ambulatory Visit (HOSPITAL_COMMUNITY): Payer: Self-pay

## 2021-11-09 ENCOUNTER — Encounter: Payer: Self-pay | Admitting: Physician Assistant

## 2021-11-09 ENCOUNTER — Other Ambulatory Visit (HOSPITAL_COMMUNITY): Payer: Self-pay

## 2021-11-09 ENCOUNTER — Telehealth (INDEPENDENT_AMBULATORY_CARE_PROVIDER_SITE_OTHER): Payer: No Typology Code available for payment source | Admitting: Physician Assistant

## 2021-11-09 VITALS — BP 120/70 | HR 60 | Temp 97.9°F

## 2021-11-09 DIAGNOSIS — U071 COVID-19: Secondary | ICD-10-CM | POA: Diagnosis not present

## 2021-11-09 DIAGNOSIS — J039 Acute tonsillitis, unspecified: Secondary | ICD-10-CM

## 2021-11-09 DIAGNOSIS — E860 Dehydration: Secondary | ICD-10-CM | POA: Insufficient documentation

## 2021-11-09 DIAGNOSIS — J029 Acute pharyngitis, unspecified: Secondary | ICD-10-CM

## 2021-11-09 DIAGNOSIS — J101 Influenza due to other identified influenza virus with other respiratory manifestations: Secondary | ICD-10-CM

## 2021-11-09 MED ORDER — NIRMATRELVIR/RITONAVIR (PAXLOVID)TABLET
3.0000 | ORAL_TABLET | Freq: Two times a day (BID) | ORAL | 0 refills | Status: AC
Start: 2021-11-09 — End: 2021-11-14

## 2021-11-09 MED ORDER — DEXAMETHASONE 4 MG PO TABS: 4.0000 mg | ORAL_TABLET | Freq: Two times a day (BID) | ORAL | 0 refills | Status: DC

## 2021-11-09 MED ORDER — LIDOCAINE VISCOUS HCL 2 % MT SOLN: 15.0000 mL | OROMUCOSAL | 0 refills | Status: DC | PRN

## 2021-11-09 NOTE — Assessment & Plan Note (Addendum)
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 She was significantly dehydrated so I was called for more of an urgent intervention, disrupting the normal flow of clinic.  I spent approximately 30 minutes with the patient. 20-gauge angiocatheter placed left cubital fossa, I then infused 2 L normal saline. Patient was discharged in stable condition.

## 2021-11-09 NOTE — Progress Notes (Signed)
Day 6 of symptoms but going to start paxlovid for covid to see if we can help her feel better faster.

## 2021-11-09 NOTE — Progress Notes (Signed)
..Virtual Visit via Video Note  I connected with April Webster on 11/09/21 at 10:10 AM EDT by a video enabled telemedicine application and verified that I am speaking with the correct person using two identifiers.  Location: Patient: home Provider: clinic  .Marland KitchenParticipating in visit:  Patient: April Webster Provider: Tandy Gaw PA-C   I discussed the limitations of evaluation and management by telemedicine and the availability of in person appointments. The patient expressed understanding and agreed to proceed.  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 She tested on Thursday and was positive for covid and flu A and B. She did take Xofluza but did not take any antivirals for covid. Her main symptom is sore throat to where she cannot eat and drinking is painful. She normally drinks about 100oz of water a day and down to 30oz. She ran a fever initially but has not ran one in a few days. The body aches are getting better. She is SOB with exertion and pulse ox does drop some when she takes the dog out. She did notice white blisters on her throat.   .. 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   Lichen sclerosus 03/19/2019   Upper airway cough syndrome 03/19/2019   Depression 05/10/2011   Lesion of oral mucosa 08/01/2014   Gastroesophageal reflux disease 05/16/2019   Chronic insomnia 09/20/2019   Poor sleep pattern 11/16/2019   Facial trauma 12/03/2019   Cervical pain 12/14/2019   Chronic left hip pain 01/29/2020   Raynaud's disease 02/18/2020   IC (interstitial cystitis) 02/18/2020   Chronic constipation 02/18/2020   LGSIL on Pap smear of cervix 07/01/2020   Allergic rhinitis 07/04/2020   Right  hip pain 01/02/2021   Left hand pain 04/27/2021   Wellness examination 06/08/2021   Chronic pain of right knee 08/12/2021   Primary insomnia 09/09/2021   Resolved Ambulatory Problems    Diagnosis Date Noted   Essential hypertension 03/19/2019   Past Medical History:  Diagnosis Date   Chronic idiopathic constipation    DDD (degenerative disc disease), cervical and lumbar    Graves disease    Hypertension        Observations/Objective: No acute distress Normal breathing Pale appearance Hoarse voice  .Marland Kitchen Today's Vitals   11/09/21 1017  BP: 120/70  Pulse: 60  Temp: 97.9 F (36.6 C)  SpO2: 97%   There is no height or weight on file to calculate BMI.    Assessment and Plan: .Marland KitchenJoy was seen today for covid positive.  Diagnoses and all orders for this visit:  Influenza A -     dexamethasone (DECADRON) 4 MG tablet; Take 1 tablet (4 mg total) by mouth 2 (two) times daily with a meal.  COVID-19 -     dexamethasone (DECADRON) 4 MG tablet; Take 1 tablet (4 mg total) by mouth 2 (two) times daily with a meal.  Influenza B -     dexamethasone (DECADRON) 4 MG tablet; Take 1 tablet (4 mg total) by mouth 2 (two) times daily with a meal.  Sore throat -     dexamethasone (DECADRON) 4 MG tablet; Take 1 tablet (4 mg total) by mouth 2 (two) times daily  with a meal. -     lidocaine (XYLOCAINE) 2 % solution; Use as directed 15 mLs in the mouth or throat every 3 (three) hours as needed (mouth/throat pain - gargle and spit).  Dehydration  Tonsillitis -     dexamethasone (DECADRON) 4 MG tablet; Take 1 tablet (4 mg total) by mouth 2 (two) times daily with a meal. -     lidocaine (XYLOCAINE) 2 % solution; Use as directed 15 mLs in the mouth or throat every 3 (three) hours as needed (mouth/throat pain - gargle and spit).   Day 6 of symptoms and out of window for paxlovid Discussed symptomatic care Start prednisone and given lidocaine mouthwash to help with ST so she can eat and drink  more Ok to use tylenol for pain Pt will come in for fluids due to weakness and decrease in fluids Follow up as needed or if new symptoms appear    Follow Up Instructions:    I discussed the assessment and treatment plan with the patient. The patient was provided an opportunity to ask questions and all were answered. The patient agreed with the plan and demonstrated an understanding of the instructions.   The patient was advised to call back or seek an in-person evaluation if the symptoms worsen or if the condition fails to improve as anticipated.   Tandy Gaw, PA-C

## 2021-11-09 NOTE — Addendum Note (Signed)
Addended by: Jomarie Longs on: 11/09/2021 11:34 AM   Modules accepted: Orders

## 2021-11-11 ENCOUNTER — Other Ambulatory Visit (HOSPITAL_COMMUNITY): Payer: Self-pay

## 2021-11-11 MED ORDER — AMPHETAMINE-DEXTROAMPHET ER 15 MG PO CP24
15.0000 mg | ORAL_CAPSULE | Freq: Every morning | ORAL | 0 refills | Status: DC
  Filled 2021-11-11: qty 30, 30d supply, fill #0

## 2021-11-12 ENCOUNTER — Other Ambulatory Visit: Payer: Self-pay | Admitting: Sports Medicine

## 2021-11-12 DIAGNOSIS — R0602 Shortness of breath: Secondary | ICD-10-CM

## 2021-11-13 ENCOUNTER — Ambulatory Visit (INDEPENDENT_AMBULATORY_CARE_PROVIDER_SITE_OTHER): Payer: No Typology Code available for payment source

## 2021-11-13 ENCOUNTER — Ambulatory Visit (INDEPENDENT_AMBULATORY_CARE_PROVIDER_SITE_OTHER): Payer: No Typology Code available for payment source | Admitting: Sports Medicine

## 2021-11-13 VITALS — BP 135/87 | HR 57 | Temp 97.9°F | Ht 63.0 in | Wt 123.2 lb

## 2021-11-13 DIAGNOSIS — R0602 Shortness of breath: Secondary | ICD-10-CM

## 2021-11-13 DIAGNOSIS — E039 Hypothyroidism, unspecified: Secondary | ICD-10-CM | POA: Diagnosis not present

## 2021-11-13 DIAGNOSIS — R079 Chest pain, unspecified: Secondary | ICD-10-CM

## 2021-11-13 DIAGNOSIS — U071 COVID-19: Secondary | ICD-10-CM

## 2021-11-13 NOTE — Progress Notes (Signed)
Patient was scheduled a NV for an EKG.

## 2021-11-13 NOTE — Progress Notes (Signed)
   Twelve-lead ECG performed interpreted by me, normal sinus rhythm with sinus bradycardia, normal PR interval, no ST changes, normal axis, increased QRS complex height in the septal leads but she has had an echocardiogram that was structurally normal.  Chest x-ray personally reviewed, no infiltrates.  Hypothyroidism TSH is only minimally low, please add T3 and T4 levels to the blood already in the lab, orders placed, considering concurrent illness and if T3 and T4 levels are normal I think this is probably more of a euthyroid sick syndrome type picture considering low TSH and I would not intervene yet, I would rather recheck TSH in 6 weeks or so.  We can leave this up to PCP.    ___________________________________________ Ihor Austin. Benjamin Stain, M.D., ABFM., CAQSM., AME. Primary Care and Sports Medicine [facility]  Adjunct Instructor of Family Medicine  [employer]  Restaurant manager, fast food

## 2021-11-14 LAB — TSH: TSH: 0.18 mIU/L — ABNORMAL LOW

## 2021-11-14 LAB — CBC
HCT: 41.9 % (ref 35.0–45.0)
Hemoglobin: 14.3 g/dL (ref 11.7–15.5)
MCH: 32.9 pg (ref 27.0–33.0)
MCHC: 34.1 g/dL (ref 32.0–36.0)
MCV: 96.3 fL (ref 80.0–100.0)
MPV: 11.1 fL (ref 7.5–12.5)
Platelets: 220 10*3/uL (ref 140–400)
RBC: 4.35 10*6/uL (ref 3.80–5.10)
RDW: 12 % (ref 11.0–15.0)
WBC: 11.5 10*3/uL — ABNORMAL HIGH (ref 3.8–10.8)

## 2021-11-14 LAB — COMPLETE METABOLIC PANEL WITH GFR
AG Ratio: 1.8 (calc) (ref 1.0–2.5)
ALT: 38 U/L — ABNORMAL HIGH (ref 6–29)
AST: 22 U/L (ref 10–35)
Albumin: 4.2 g/dL (ref 3.6–5.1)
Alkaline phosphatase (APISO): 54 U/L (ref 31–125)
BUN: 17 mg/dL (ref 7–25)
CO2: 29 mmol/L (ref 20–32)
Calcium: 9.6 mg/dL (ref 8.6–10.2)
Chloride: 101 mmol/L (ref 98–110)
Creat: 0.73 mg/dL (ref 0.50–0.99)
Globulin: 2.4 g/dL (calc) (ref 1.9–3.7)
Glucose, Bld: 119 mg/dL — ABNORMAL HIGH (ref 65–99)
Potassium: 4 mmol/L (ref 3.5–5.3)
Sodium: 140 mmol/L (ref 135–146)
Total Bilirubin: 0.4 mg/dL (ref 0.2–1.2)
Total Protein: 6.6 g/dL (ref 6.1–8.1)
eGFR: 103 mL/min/{1.73_m2} (ref >=60)

## 2021-11-14 LAB — D-DIMER, QUANTITATIVE: D-Dimer, Quant: 0.31 mcg/mL FEU (ref <0.50)

## 2021-11-17 NOTE — Assessment & Plan Note (Signed)
TSH is only minimally low, please add T3 and T4 levels to the blood already in the lab, orders placed, considering concurrent illness and if T3 and T4 levels are normal I think this is probably more of a euthyroid sick syndrome type picture considering low TSH and I would not intervene yet, I would rather recheck TSH in 6 weeks or so.  We can leave this up to PCP.

## 2021-11-17 NOTE — Addendum Note (Signed)
Addended by: Monica Becton on: 11/17/2021 09:00 AM   Modules accepted: Orders

## 2021-11-18 ENCOUNTER — Encounter: Payer: Self-pay | Admitting: Family Medicine

## 2021-11-19 ENCOUNTER — Other Ambulatory Visit (HOSPITAL_COMMUNITY): Payer: Self-pay

## 2021-12-01 ENCOUNTER — Telehealth: Payer: Self-pay

## 2021-12-01 NOTE — Telephone Encounter (Signed)
Spoke with [facility] regarding the T3 & T4 results. The rep that added the tests did not do it correctly therefore the labs will need to be redrawn.

## 2021-12-01 NOTE — Telephone Encounter (Signed)
Okay please just let Zelena know that she just needs to get them redrawn.  Sigh.

## 2021-12-01 NOTE — Telephone Encounter (Signed)
Sent mychart msg to patient to let her know to come by at her earliest convenience to have the labs redrawn.

## 2021-12-03 ENCOUNTER — Other Ambulatory Visit: Payer: Self-pay | Admitting: Sports Medicine

## 2021-12-03 DIAGNOSIS — E039 Hypothyroidism, unspecified: Secondary | ICD-10-CM

## 2021-12-04 LAB — T3, FREE: T3, Free: 3 pg/mL (ref 2.3–4.2)

## 2021-12-04 LAB — T4, FREE: Free T4: 1.6 ng/dL (ref 0.8–1.8)

## 2021-12-04 LAB — TSH: TSH: 0.4 mIU/L

## 2021-12-24 ENCOUNTER — Telehealth: Payer: Self-pay | Admitting: Family Medicine

## 2021-12-24 ENCOUNTER — Other Ambulatory Visit (HOSPITAL_COMMUNITY): Payer: Self-pay

## 2021-12-24 ENCOUNTER — Other Ambulatory Visit: Payer: Self-pay | Admitting: Family Medicine

## 2021-12-24 DIAGNOSIS — M542 Cervicalgia: Secondary | ICD-10-CM

## 2021-12-24 MED ORDER — PREGABALIN 25 MG PO CAPS
ORAL_CAPSULE | ORAL | 0 refills | Status: DC
  Filled 2021-12-24: qty 80, 25d supply, fill #0

## 2021-12-24 MED ORDER — PREGABALIN 25 MG PO CAPS
ORAL_CAPSULE | ORAL | 0 refills | Status: DC
  Filled 2021-12-24: qty 85, 25d supply, fill #0

## 2021-12-24 NOTE — Telephone Encounter (Signed)
Patient plans on discontinuing the Adderall not feeling like it is very effective.  If anything makes her feel little bit more chatty.  Also wanting to taper off of the Lyrica.  Taper sent to pharmacy.  Continue meloxicam.

## 2021-12-25 ENCOUNTER — Other Ambulatory Visit (HOSPITAL_COMMUNITY): Payer: Self-pay

## 2021-12-27 ENCOUNTER — Encounter: Payer: Self-pay | Admitting: Cardiovascular Disease

## 2021-12-28 ENCOUNTER — Other Ambulatory Visit (HOSPITAL_COMMUNITY): Payer: Self-pay

## 2022-01-05 ENCOUNTER — Other Ambulatory Visit: Payer: Self-pay | Admitting: Family Medicine

## 2022-01-05 ENCOUNTER — Encounter: Payer: Self-pay | Admitting: Cardiovascular Disease

## 2022-01-05 ENCOUNTER — Other Ambulatory Visit (HOSPITAL_COMMUNITY): Payer: Self-pay

## 2022-01-05 ENCOUNTER — Telehealth: Payer: Self-pay | Admitting: Sports Medicine

## 2022-01-05 DIAGNOSIS — F32A Depression, unspecified: Secondary | ICD-10-CM

## 2022-01-05 MED ORDER — VORTIOXETINE HBR 10 MG PO TABS
10.0000 mg | ORAL_TABLET | Freq: Every day | ORAL | 11 refills | Status: DC
  Filled 2022-01-05: qty 30, 30d supply, fill #0
  Filled 2022-02-03: qty 30, 30d supply, fill #1
  Filled 2022-03-02: qty 30, 30d supply, fill #2
  Filled 2022-04-03: qty 30, 30d supply, fill #3
  Filled 2022-04-30: qty 30, 30d supply, fill #4
  Filled 2022-05-30: qty 30, 30d supply, fill #5
  Filled 2022-07-18: qty 30, 30d supply, fill #6

## 2022-01-05 NOTE — Assessment & Plan Note (Signed)
April Webster is having some increasing anhedonia, suspect worsening depressive symptoms, she has tried some SSRIs in the past including Zoloft, Celexa, Cymbalta, Paxil, Wellbutrin (created blood pressure spike at 150 XL).  She is also coming off of Lyrica which has precipitated increase in widespread body pains. Also discontinuing Adderall which did not improve focus. I do think we need to try somewhat of a different mechanism, we will start with Trintellix at 10 mg with no changes for 6 weeks.  If insufficient improvement we will go up to 20 mg, if this does not work we will try bupropion again.

## 2022-01-05 NOTE — Progress Notes (Unsigned)
Cardiology Office Note:    Date:  01/06/2022   ID:  April Webster, DOB [birth year], MRN [id number]  PCP:  April Games, MD   [facility] Providers Cardiologist:  April Webster    Referring MD: April Webster, *   Chief Complaint  Patient presents with   orthostatic hypotension    History of Present Illness:    April Webster is a 46 y.o. female with a hx of hypotension  Has had HTN in the past Did not tolerate Lisinopril ( hypotension) Attributed her HTN to oral contraceptives.   Took losartan / HCTZ for a while  Last year,  started working out, better diet 155 lbs to 125 lbs  Has raynauds syndrome  Tried amlodipie and nifidipine   Has periodic episodes of hypotension  Has chronically low BP ( unless  she is stressed )   Diet is good  Hydrates well   Oct.25, 2023 April Webster is seen for follow up visit for her episodes of orthostatic hypotension.     BP readings have looked great She has only needed Florifnef several times   Is active ,  working out some   Hx of HLD ,  LDL was 160 prior to atorvastatin  Strong family hx of CAD  Grandmother died at age 7  Uncles at age 54, another uncle in 2s   Will get a coronary calcium score    Past Medical History:  Diagnosis Date   Chronic idiopathic constipation    DDD (degenerative disc disease), cervical and lumbar    Female pattern hair loss    Graves disease    Hyperlipidemia    Hypertension    Lichen sclerosus    Raynaud's disease     Past Surgical History:  Procedure Laterality Date   ABDOMINOPLASTY     AUGMENTATION MAMMAPLASTY Bilateral 05/2019   Silicone    PLACEMENT OF BREAST IMPLANTS     THYROID SURGERY      Current Medications: Current Meds  Medication Sig   albuterol (VENTOLIN HFA) 108 (90 Base) MCG/ACT inhaler Inhale 2 puffs into the lungs every 6 (six) hours as needed for wheezing or shortness of breath.   atorvastatin (LIPITOR) 20 MG tablet Take 1 tablet (20 mg total) by  mouth daily.   baclofen (LIORESAL) 10 MG tablet TAKE 1 TABLET  BY MOUTH AT BEDTIME.   clobetasol ointment (TEMOVATE) 0.05 % Apply 1 application topically daily as needed for rash. LIchen sclerosis   FIBER SELECT GUMMIES PO Take 2 each by mouth daily.   finasteride (PROSCAR) 5 MG tablet TAKE 1/2 TABLET (2.5 MG TOTAL) BY MOUTH DAILY.   levonorgestrel (MIRENA) 20 MCG/24HR IUD by Intrauterine route.   lubiprostone (AMITIZA) 24 MCG capsule Take 1 capsule (24 mcg total) by mouth 2 (two) times daily with a meal. (Patient taking differently: Take 24 mcg by mouth daily with breakfast.)   melatonin 5 MG TABS Take 5 mg by mouth at bedtime.   meloxicam (MOBIC) 15 MG tablet Take 1 tablet (15 mg total) by mouth daily.   SYNTHROID 125 MCG tablet Take 1 tablet (125 mcg total) by mouth daily.   traZODone (DESYREL) 50 MG tablet Take 1/2 to 2 tablets (25-100 mg total) by mouth at bedtime.   vortioxetine HBr (TRINTELLIX) 10 MG TABS tablet Take 1 tablet (10 mg total) by mouth daily.     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  Other Topics Concern    Not on file  Social History Narrative   Not on file   Social Determinants of Health   Financial Resource Strain: Not on file  Food Insecurity: Not on file  Transportation Needs: Not on file  Physical Activity: Not on file  Stress: Not on file  Social Connections: Not on file     Family History: The patient's family history includes Heart attack in her maternal grandmother; Hyperlipidemia in her mother; Hypertension in her mother; Lung cancer in her father; Osteoporosis in her mother; Stomach cancer in her maternal grandfather. There is no history of Colon cancer, Rectal cancer, Liver cancer, Esophageal cancer, or Pancreatic cancer.  ROS:   Please see the history of present illness.     All other systems reviewed and are negative.  EKGs/Labs/Other Studies Reviewed:    The following studies were reviewed today:   EKG:     Recent Labs: 11/13/2021: ALT 38; BUN 17; Creat 0.73; Hemoglobin 14.3; Platelets 220; Potassium 4.0; Sodium 140 12/03/2021: TSH 0.40  Recent Lipid Panel    Component Value Date/Time   CHOL 144 06/04/2021 0000   TRIG 51 06/04/2021 0000   HDL 58 06/04/2021 0000   CHOLHDL 2.5 06/04/2021 0000   LDLCALC 73 06/04/2021 0000     Risk Assessment/Calculations:           Physical Exam:     Physical Exam: Blood pressure 124/72, pulse 60, height 5\' 3"  (1.6 m), weight 128 lb 3.2 oz (58.2 kg).       GEN:  Well nourished, well developed in no acute distress HEENT: Normal NECK: No JVD; No carotid bruits LYMPHATICS: No lymphadenopathy CARDIAC: RRR ,  very soft systolic murmur  RESPIRATORY:  Clear to auscultation without rales, wheezing or rhonchi  ABDOMEN: Soft, non-tender, non-distended MUSCULOSKELETAL:  No edema; No deformity  SKIN: Warm and dry NEUROLOGIC:  Alert and oriented x 3   ASSESSMENT:    1. Mixed hyperlipidemia   2. Pre-syncope     PLAN:    In order of problems listed above:   Orthostatic hypotension:    Has been persistent ,  despite eating lots of salt Hydrates well,  eats normal ,healthy meals ? Adrenal insufficiency / adrenal fatigue ( but electrolytes are normal   She drinks lots of coffee, eats regularly during the work week and her BP will typically be low/normal .  ON the weekends, she does not "push" caffeine or fluids quite as much, and is much less stressed and her BP will be 80s.  Overall she has not been having significant episodes of orthostasis.  She will continue to monitor and will take Florinef only as needed.  She will call us back if she needs to be seen prior to a year.   Echo shows normal LV function, trace MR, mild AI .        2.  Hyperlipidemia:   She has a history of hyperlipidemia.  Her LDL was 160 prior to atorvastatin.  With atorvastatin is down into the mid 80s. She has a strong family history of premature coronary artery disease.  I think she would benefit from getting a coronary calcium scoreIf her calcium score is high we would be much more aggressive with her lipid lowering I would suggest an LDL level of 55.Marland Kitchen  She will let us know if she has any episodes of angina.  She is eager to start working out again after getting over COVID and influenza.       Medication Adjustments/Labs and Tests Ordered: Current medicines are reviewed at length with the patient today.  Concerns regarding medicines are outlined above.  Orders Placed This Encounter  Procedures   CT CARDIAC SCORING (SELF PAY ONLY)   No orders of the defined types were placed in this encounter.   Patient Instructions  Medication Instructions:  Your physician recommends that you continue on your current medications as directed. Please refer to the Current Medication list given to you today.  *If you need a refill on your cardiac medications before your next appointment, please call your pharmacy*  Testing/Procedures: Your physician has requested that you have a calcium score CT scan. There is a $99 fee for the  scan.   Follow-Up: At [facility], you and your health needs are our priority.  As part of our continuing mission to provide you with exceptional heart care, we have created designated Provider Care Teams.  These Care Teams include your primary Cardiologist (physician) and Advanced Practice Providers (APPs -  Physician Assistants and Nurse Practitioners) who all work together to provide you with the care you need, when you need it.  Your next appointment:   1 year(s)  The format for your next appointment:   In Person  Provider:   Kristeen Miss, MD  Important Information About Sugar         Signed, Kristeen Miss, MD  01/06/2022 5:35 PM    [facility]

## 2022-01-05 NOTE — Telephone Encounter (Signed)
April Webster is having some increasing anhedonia, suspect worsening depressive symptoms, she has tried some SSRIs in the past including Zoloft, Celexa, Cymbalta, Paxil, Wellbutrin (created blood pressure spike at 150 XL).  She is also coming off of Lyrica which has precipitated increase in widespread body pains. Also discontinuing Adderall which did not improve focus. I do think we need to try somewhat of a different mechanism, we will start with Trintellix at 10 mg with no changes for 6 weeks.  If insufficient improvement we will go up to 20 mg, if this does not work we will try bupropion again. 

## 2022-01-06 ENCOUNTER — Encounter: Payer: Self-pay | Admitting: Cardiovascular Disease

## 2022-01-06 ENCOUNTER — Ambulatory Visit
Payer: No Typology Code available for payment source | Attending: Cardiovascular Disease | Admitting: Cardiovascular Disease

## 2022-01-06 ENCOUNTER — Other Ambulatory Visit (HOSPITAL_COMMUNITY): Payer: Self-pay

## 2022-01-06 VITALS — BP 124/72 | HR 60 | Ht 63.0 in | Wt 128.2 lb

## 2022-01-06 DIAGNOSIS — R55 Syncope and collapse: Secondary | ICD-10-CM | POA: Diagnosis not present

## 2022-01-06 DIAGNOSIS — E782 Mixed hyperlipidemia: Secondary | ICD-10-CM

## 2022-01-06 MED ORDER — MELOXICAM 15 MG PO TABS
15.0000 mg | ORAL_TABLET | Freq: Every day | ORAL | 1 refills | Status: DC
  Filled 2022-01-06: qty 90, 90d supply, fill #0
  Filled 2022-04-03: qty 90, 90d supply, fill #1

## 2022-01-06 MED ORDER — SYNTHROID 125 MCG PO TABS
125.0000 ug | ORAL_TABLET | Freq: Every day | ORAL | 0 refills | Status: DC
  Filled 2022-01-06: qty 90, 90d supply, fill #0

## 2022-01-06 NOTE — Patient Instructions (Signed)
Medication Instructions:  Your physician recommends that you continue on your current medications as directed. Please refer to the Current Medication list given to you today.  *If you need a refill on your cardiac medications before your next appointment, please call your pharmacy*  Testing/Procedures: Your physician has requested that you have a calcium score CT scan. There is a $99 fee for the scan.   Follow-Up: At [facility], you and your health needs are our priority.  As part of our continuing mission to provide you with exceptional heart care, we have created designated Provider Care Teams.  These Care Teams include your primary Cardiologist (physician) and Advanced Practice Providers (APPs -  Physician Assistants and Nurse Practitioners) who all work together to provide you with the care you need, when you need it.  Your next appointment:   1 year(s)  The format for your next appointment:   In Person  Provider:   Mertie Moores, MD  Important Information About Sugar

## 2022-01-13 ENCOUNTER — Ambulatory Visit: Payer: No Typology Code available for payment source | Admitting: Obstetrics and Gynecology

## 2022-01-13 ENCOUNTER — Other Ambulatory Visit: Payer: No Typology Code available for payment source

## 2022-01-13 ENCOUNTER — Other Ambulatory Visit (HOSPITAL_COMMUNITY)
Admission: RE | Admit: 2022-01-13 | Discharge: 2022-01-13 | Disposition: A | Discharge status: Home / Self Care | Payer: No Typology Code available for payment source | Source: Ambulatory Visit | Attending: Obstetrics and Gynecology | Admitting: Obstetrics and Gynecology

## 2022-01-13 ENCOUNTER — Ambulatory Visit (INDEPENDENT_AMBULATORY_CARE_PROVIDER_SITE_OTHER): Payer: Self-pay

## 2022-01-13 ENCOUNTER — Encounter: Payer: Self-pay | Admitting: Obstetrics and Gynecology

## 2022-01-13 VITALS — BP 136/82 | HR 66 | Ht 63.0 in | Wt 128.0 lb

## 2022-01-13 DIAGNOSIS — N921 Excessive and frequent menstruation with irregular cycle: Secondary | ICD-10-CM | POA: Diagnosis not present

## 2022-01-13 DIAGNOSIS — E782 Mixed hyperlipidemia: Secondary | ICD-10-CM

## 2022-01-13 DIAGNOSIS — Z975 Presence of (intrauterine) contraceptive device: Secondary | ICD-10-CM

## 2022-01-13 DIAGNOSIS — R55 Syncope and collapse: Secondary | ICD-10-CM

## 2022-01-13 DIAGNOSIS — N898 Other specified noninflammatory disorders of vagina: Secondary | ICD-10-CM | POA: Insufficient documentation

## 2022-01-13 DIAGNOSIS — Z136 Encounter for screening for cardiovascular disorders: Secondary | ICD-10-CM

## 2022-01-13 NOTE — Progress Notes (Signed)
GYNECOLOGY OFFICE VISIT NOTE  History:   April Webster is a 46 y.o. [phone] here today for follow up for her bleeding. She has an IUD in place.  She has had continued vaginal bleeding that seems to be worsening.  She has the IUD for heavy bleeding and it has helped with that but now in the last 1 year or so her bleeding has continued to become longer (8-10 days now) and more frequent Q3 weeks.   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     Past Medical History:  Diagnosis Date   Chronic idiopathic constipation    DDD (degenerative disc disease), cervical and lumbar    Female pattern hair loss    Graves disease    Hyperlipidemia    Hypertension    Lichen sclerosus    Raynaud's disease     Past Surgical History:  Procedure Laterality Date   ABDOMINOPLASTY     AUGMENTATION MAMMAPLASTY Bilateral 05/2019   Silicone    PLACEMENT OF BREAST IMPLANTS     THYROID SURGERY      The following portions of the patient's history were reviewed and updated as appropriate: allergies, current medications, past family history, past medical history, past social history, past surgical history and problem list.   Health Maintenance:   Normal pap and negative HRHPV on 05/2021.   Diagnosis  Date Value Ref Range Status  06/04/2021   Final   - Negative for intraepithelial lesion or malignancy (NILM)     Normal mammogram on 08/2021.   Review of Systems:  Pertinent items noted in HPI and remainder of comprehensive ROS otherwise negative.  Physical Exam:  BP 136/82   Pulse 66   Ht 5\' 3"  (1.6 m)   Wt 128 lb (58.1 kg)   BMI 22.67 kg/m  CONSTITUTIONAL: Well-developed, well-nourished female in no acute distress.  HEENT:  Normocephalic, atraumatic. External right and left ear normal. No scleral icterus.  NECK: Normal range of motion, supple, no masses noted on observation SKIN: No rash noted. Not diaphoretic. No erythema. No  pallor. MUSCULOSKELETAL: Normal range of motion. No edema noted. NEUROLOGIC: Alert and oriented to person, place, and time. Normal muscle tone coordination. No cranial nerve deficit noted. PSYCHIATRIC: Normal mood and affect. Normal behavior. Normal judgment and thought content.  CARDIOVASCULAR: Normal heart rate noted RESPIRATORY: Effort and breath sounds normal, no problems with respiration noted ABDOMEN: No masses noted. No other overt distention noted.    PELVIC: Normal appearing external genitalia; normal urethral meatus; normal appearing vaginal mucosa and cervix.  No abnormal discharge noted.  Normal uterine size, no other palpable masses, no uterine or adnexal tenderness. Performed in the presence of a chaperone  Labs and Imaging No results found for this or any previous visit (from the past 168 hour(s)). No results found.  Assessment and Plan:  April Webster was seen today for discuss bleeding.  Diagnoses and all orders for this visit:  Breakthrough bleeding associated with intrauterine device (IUD) - Discussed options - IUD change to new IUD, ablation or hysterectomy. We discussed other birth control options but they are not a good fit for her. She discussed the risks of each option of the three above. We discussed risks of ablation - PATS, doesn't work, uterine perforation, bleeding. We discussed risk of hysterectomy  - bleeding, infection, injury to bowel/bladder but especially bladder since she has had 3 c-sections. We discussed long term impact -  I.e. possible prolapse (lower for her due to history of c-sections), potential impact on blood flow to ovary. No studies show change in enjoyment in sexual activity.  - She would like to do ablation. Discussed normally, I would do EMB before but since she is low risk of endometrial cancer and hyperplasia with presence of IUD, I can do it at time of ablation. Discussed risk would be that if she had occult cancer, it would affect ability to stage the  cancer. She would like to wait until the ablation.  - Reviewed recovery following ablation.   Vaginal odor -     Cervicovaginal ancillary only( [place])   Routine preventative health maintenance measures emphasized. Please refer to After Visit Summary for other counseling recommendations.   No follow-ups on file.  Radene Gunning, MD, [place] [facility], [place]

## 2022-01-13 NOTE — H&P (View-Only) (Signed)
GYNECOLOGY OFFICE VISIT NOTE  History:   April Webster is a 46 y.o. [phone] here today for follow up for her bleeding. She has an IUD in place.  She has had continued vaginal bleeding that seems to be worsening.  She has the IUD for heavy bleeding and it has helped with that but now in the last 1 year or so her bleeding has continued to become longer (8-10 days now) and more frequent Q3 weeks.   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     Past Medical History:  Diagnosis Date   Chronic idiopathic constipation    DDD (degenerative disc disease), cervical and lumbar    Female pattern hair loss    Graves disease    Hyperlipidemia    Hypertension    Lichen sclerosus    Raynaud's disease     Past Surgical History:  Procedure Laterality Date   ABDOMINOPLASTY     AUGMENTATION MAMMAPLASTY Bilateral 05/2019   Silicone    PLACEMENT OF BREAST IMPLANTS     THYROID SURGERY      The following portions of the patient's history were reviewed and updated as appropriate: allergies, current medications, past family history, past medical history, past social history, past surgical history and problem list.   Health Maintenance:   Normal pap and negative HRHPV on 05/2021.   Diagnosis  Date Value Ref Range Status  06/04/2021   Final   - Negative for intraepithelial lesion or malignancy (NILM)     Normal mammogram on 08/2021.   Review of Systems:  Pertinent items noted in HPI and remainder of comprehensive ROS otherwise negative.  Physical Exam:  BP 136/82   Pulse 66   Ht 5\' 3"  (1.6 m)   Wt 128 lb (58.1 kg)   BMI 22.67 kg/m  CONSTITUTIONAL: Well-developed, well-nourished female in no acute distress.  HEENT:  Normocephalic, atraumatic. External right and left ear normal. No scleral icterus.  NECK: Normal range of motion, supple, no masses noted on observation SKIN: No rash noted. Not diaphoretic. No erythema. No  pallor. MUSCULOSKELETAL: Normal range of motion. No edema noted. NEUROLOGIC: Alert and oriented to person, place, and time. Normal muscle tone coordination. No cranial nerve deficit noted. PSYCHIATRIC: Normal mood and affect. Normal behavior. Normal judgment and thought content.  CARDIOVASCULAR: Normal heart rate noted RESPIRATORY: Effort and breath sounds normal, no problems with respiration noted ABDOMEN: No masses noted. No other overt distention noted.    PELVIC: Normal appearing external genitalia; normal urethral meatus; normal appearing vaginal mucosa and cervix.  No abnormal discharge noted.  Normal uterine size, no other palpable masses, no uterine or adnexal tenderness. Performed in the presence of a chaperone  Labs and Imaging No results found for this or any previous visit (from the past 168 hour(s)). No results found.  Assessment and Plan:  [name] was seen today for discuss bleeding.  Diagnoses and all orders for this visit:  Breakthrough bleeding associated with intrauterine device (IUD) - Discussed options - IUD change to new IUD, ablation or hysterectomy. We discussed other birth control options but they are not a good fit for her. She discussed the risks of each option of the three above. We discussed risks of ablation - PATS, doesn't work, uterine perforation, bleeding. We discussed risk of hysterectomy  - bleeding, infection, injury to bowel/bladder but especially bladder since she has had 3 c-sections. We discussed long term impact -  I.e. possible prolapse (lower for her due to history of c-sections), potential impact on blood flow to ovary. No studies show change in enjoyment in sexual activity.  - She would like to do ablation. Discussed normally, I would do EMB before but since she is low risk of endometrial cancer and hyperplasia with presence of IUD, I can do it at time of ablation. Discussed risk would be that if she had occult cancer, it would affect ability to stage the  cancer. She would like to wait until the ablation.  - Reviewed recovery following ablation.   Vaginal odor -     Cervicovaginal ancillary only( [place])   Routine preventative health maintenance measures emphasized. Please refer to After Visit Summary for other counseling recommendations.   No follow-ups on file.  Radene Gunning, MD, [place] [facility], [place]

## 2022-01-14 LAB — CERVICOVAGINAL ANCILLARY ONLY
Bacterial Vaginitis (gardnerella): NEGATIVE
Comment: NEGATIVE

## 2022-01-21 ENCOUNTER — Telehealth: Payer: Self-pay | Admitting: *Deleted

## 2022-01-21 ENCOUNTER — Other Ambulatory Visit: Payer: Self-pay | Admitting: Sports Medicine

## 2022-01-21 ENCOUNTER — Other Ambulatory Visit (HOSPITAL_BASED_OUTPATIENT_CLINIC_OR_DEPARTMENT_OTHER): Payer: Self-pay

## 2022-01-21 DIAGNOSIS — J111 Influenza due to unidentified influenza virus with other respiratory manifestations: Secondary | ICD-10-CM | POA: Insufficient documentation

## 2022-01-21 MED ORDER — XOFLUZA (40 MG DOSE) 1 X 40 MG PO TBPK
1.0000 | ORAL_TABLET | Freq: Once | ORAL | 11 refills | Status: AC
  Filled 2022-01-21: qty 1, 1d supply, fill #0

## 2022-01-21 NOTE — Telephone Encounter (Signed)
Pt filled out phq-9 and gad-7.

## 2022-01-21 NOTE — Assessment & Plan Note (Signed)
1 day of symptoms, mild sore throat, no lower respiratory symptoms. Daughter has influenza-like illness. Raynelle Fanning did test positive for influenza A and influenza B. Adding Xofluza. Return as needed.

## 2022-01-22 ENCOUNTER — Other Ambulatory Visit (HOSPITAL_BASED_OUTPATIENT_CLINIC_OR_DEPARTMENT_OTHER): Payer: Self-pay

## 2022-01-25 ENCOUNTER — Other Ambulatory Visit (HOSPITAL_BASED_OUTPATIENT_CLINIC_OR_DEPARTMENT_OTHER): Payer: Self-pay

## 2022-02-02 ENCOUNTER — Other Ambulatory Visit (HOSPITAL_BASED_OUTPATIENT_CLINIC_OR_DEPARTMENT_OTHER): Payer: Self-pay

## 2022-02-03 ENCOUNTER — Other Ambulatory Visit (HOSPITAL_COMMUNITY): Payer: Self-pay

## 2022-02-05 ENCOUNTER — Other Ambulatory Visit: Payer: Self-pay

## 2022-02-05 ENCOUNTER — Encounter (HOSPITAL_COMMUNITY): Payer: Self-pay | Admitting: Obstetrics and Gynecology

## 2022-02-05 NOTE — Progress Notes (Signed)
PCP - Nani Gasser, MD Cardiologist - Kristeen Miss, MD  PPM/ICD - denies  Chest x-ray - 11/13/21 EKG - 11/13/21 ECHO - 07/24/20 Cardiac Cath - denies  CPAP - n/a  Fasting Blood Sugar - n/a  Blood Thinner Instructions: n/a Aspirin Instructions: Patient was instructed: As of today, STOP taking any Aspirin (unless otherwise instructed by your surgeon) Aleve, Naproxen, Ibuprofen, Motrin, Advil, Goody's, BC's, all herbal medications, fish oil, and all vitamins.  ERAS Protcol - n/a  COVID TEST- n/a  Anesthesia review: yes - history of palpitation  Patient verbally denies any shortness of breath, fever, cough and chest pain during phone call   -------------  SDW INSTRUCTIONS given:  Your procedure is scheduled on Tuesday, November 28th,2023.  Report to [facility] Main Entrance "A" at 12:30 the day of surgery and check in at the Admitting office.  Call this number if you have problems the morning of surgery:  [phone]   Remember:  Do not eat or drink after midnight the night before your surgery    Take these medicines the morning of surgery with A SIP OF WATER: Lipitor, Proscar, Synthroid, Trintellix PRN - inhaler  Please bring all inhalers with you the day of surgery.     The day of surgery:                      Do not wear jewelry, make up, or nail polish            Do not wear lotions, powders, perfumes, or deodorant.            Do not shave 48 hours prior to surgery.              Do not bring valuables to the hospital.            [facility] is not responsible for any belongings or valuables.  Do NOT Smoke (Tobacco/Vaping) 24 hours prior to your procedure If you use a CPAP at night, you may bring all equipment for your overnight stay.   Contacts, glasses, dentures or bridgework may not be worn into surgery.      For patients admitted to the hospital, discharge time will be determined by your treatment team.   Patients discharged the day of surgery will  not be allowed to drive home, and someone needs to stay with them for 24 hours.    Special instructions:   [facility]- Preparing For Surgery  Before surgery, you can play an important role. Because skin is not sterile, your skin needs to be as free of germs as possible. You can reduce the number of germs on your skin by washing with CHG (chlorahexidine gluconate) Soap before surgery.  CHG is an antiseptic cleaner which kills germs and bonds with the skin to continue killing germs even after washing.    Oral Hygiene is also important to reduce your risk of infection.  Remember - BRUSH YOUR TEETH THE MORNING OF SURGERY WITH YOUR REGULAR TOOTHPASTE  Please do not use if you have an allergy to CHG or antibacterial soaps. If your skin becomes reddened/irritated stop using the CHG.  Do not shave (including legs and underarms) for at least 48 hours prior to first CHG shower. It is OK to shave your face.  Please follow these instructions carefully.   Shower the NIGHT BEFORE SURGERY and the MORNING OF SURGERY with DIAL Soap.   Pat yourself dry with a CLEAN TOWEL.  Wear CLEAN  PAJAMAS to bed the night before surgery  Place CLEAN SHEETS on your bed the night of your first shower and DO NOT SLEEP WITH PETS.   Day of Surgery: Please shower morning of surgery  Wear Clean/Comfortable clothing the morning of surgery Do not apply any deodorants/lotions.   Remember to brush your teeth WITH YOUR REGULAR TOOTHPASTE.   Questions were answered. Patient verbalized understanding of instructions.

## 2022-02-08 NOTE — Progress Notes (Signed)
Anesthesia Chart Review: Same-day work-up  Follows with cardiology for history of orthostatic hypotension and chronically low blood pressure.  She also has history of Raynaud's disease.  Echo 07/24/2020 showed normal biventricular function, no significant valvular abnormalities.  Coronary calcium score 01/13/2022 was 0.  Last seen by Dr. Elease Hashimoto 01/06/2022.  Noted to be doing better at that time.  Per note, "Overall she has not been having significant episodes of orthostasis.  She will continue to monitor and will take Florinef only as needed.  She will call us back if she needs to be seen prior to a year."  Patient will need day of surgery labs and evaluation.  EKG 11/13/2021: Sinus bradycardia.  Rate 54.  Moderate voltage criteria for LVH, may be normal variant.  CT cardiac scoring 01/13/2022: FINDINGS: Coronary arteries: Normal origins.   Coronary Calcium Score:   Left main: 0   Left anterior descending artery: 0   Left circumflex artery: 0   Right coronary artery: 0   Total: 0   Percentile: 0   Pericardium: Normal.   Ascending Aorta: Normal caliber.   Non-cardiac: See separate report from [facility].   IMPRESSION: Coronary calcium score of 0. This was 0 percentile for age-, race-, and sex-matched controls.  TTE 07/24/2020:  1. Left ventricular ejection fraction, by estimation, is 55 to 60%. The  left ventricle has normal function. The left ventricle has no regional  wall motion abnormalities. Left ventricular diastolic parameters were  normal.   2. Right ventricular systolic function is normal. The right ventricular  size is normal. There is normal pulmonary artery systolic pressure.   3. Right atrial size was mildly dilated.   4. The mitral valve is normal in structure. Trivial mitral valve  regurgitation. No evidence of mitral stenosis.   5. The aortic valve is normal in structure. Aortic valve regurgitation is  mild. No aortic stenosis is present.   6. The  inferior vena cava is normal in size with greater than 50%  respiratory variability, suggesting right atrial pressure of 3 mmHg.   Stress echo 10/19/2018 (care everywhere): SUMMARY  The patient had no chest pain.  The patient achieved 88 % of maximum predicted heart rate.  The METS achieved was 8.  Exercise capacity was average.  The baseline ECG displays normal sinus rhythm.  Negative stress ECG for inducible ischemia at target heart rate.  Normal left ventricular function at rest. Negative exercise echocardiography  for inducible ischemia at target heart rate.  Zannie Cove [facility] Short Stay Center/Anesthesiology Phone [phone] 02/08/2022 9:51 AM

## 2022-02-08 NOTE — Anesthesia Preprocedure Evaluation (Signed)
Anesthesia Evaluation  Patient identified by MRN, date of birth, ID band Patient awake    Reviewed: Allergy & Precautions, NPO status , Patient's Chart, lab work & pertinent test results  Airway Mallampati: II  TM Distance: >3 FB Neck ROM: Full    Dental  (+) Partial Upper   Pulmonary neg pulmonary ROS, former smoker   Pulmonary exam normal        Cardiovascular hypertension,  Rhythm:Regular Rate:Normal     Neuro/Psych    Depression    negative neurological ROS     GI/Hepatic Neg liver ROS,GERD  ,,  Endo/Other  Hypothyroidism    Renal/GU negative Renal ROS  Female GU complaint AUB    Musculoskeletal  (+) Arthritis , Osteoarthritis,    Abdominal Normal abdominal exam  (+)   Peds  Hematology negative hematology ROS (+)   Anesthesia Other Findings   Reproductive/Obstetrics                             Anesthesia Physical Anesthesia Plan  ASA: 2  Anesthesia Plan: General   Post-op Pain Management:    Induction: Intravenous  PONV Risk Score and Plan: 3 and Ondansetron, Dexamethasone, Midazolam and Treatment may vary due to age or medical condition  Airway Management Planned: Mask and LMA  Additional Equipment: None  Intra-op Plan:   Post-operative Plan: Extubation in OR  Informed Consent: I have reviewed the patients History and Physical, chart, labs and discussed the procedure including the risks, benefits and alternatives for the proposed anesthesia with the patient or authorized representative who has indicated his/her understanding and acceptance.     Dental advisory given  Plan Discussed with: CRNA  Anesthesia Plan Comments: (PAT note by Antionette Poles, PA-C: Follows with cardiology for history of orthostatic hypotension and chronically low blood pressure.  She also has history of Raynaud's disease.  Echo 07/24/2020 showed normal biventricular function, no significant  valvular abnormalities.  Coronary calcium score 01/13/2022 was 0.  Last seen by Dr. Elease Hashimoto 01/06/2022.  Noted to be doing better at that time.  Per note, "Overall she has not been having significant episodes of orthostasis. She will continue to monitor and will take Florinef only as needed. She will call us back if she needs to be seen prior to a year."  Patient will need day of surgery labs and evaluation.  EKG 11/13/2021: Sinus bradycardia.  Rate 54.  Moderate voltage criteria for LVH, may be normal variant.  CT cardiac scoring 01/13/2022: FINDINGS: Coronary arteries: Normal origins.  Coronary Calcium Score:  Left main: 0  Left anterior descending artery: 0  Left circumflex artery: 0  Right coronary artery: 0  Total: 0  Percentile: 0  Pericardium: Normal.  Ascending Aorta: Normal caliber.  Non-cardiac: See separate report from [facility].  IMPRESSION: Coronary calcium score of 0. This was 0 percentile for age-, race-, and sex-matched controls.  TTE 07/24/2020: 1. Left ventricular ejection fraction, by estimation, is 55 to 60%. The  left ventricle has normal function. The left ventricle has no regional  wall motion abnormalities. Left ventricular diastolic parameters were  normal.  2. Right ventricular systolic function is normal. The right ventricular  size is normal. There is normal pulmonary artery systolic pressure.  3. Right atrial size was mildly dilated.  4. The mitral valve is normal in structure. Trivial mitral valve  regurgitation. No evidence of mitral stenosis.  5. The aortic valve is normal in structure. Aortic  valve regurgitation is  mild. No aortic stenosis is present.  6. The inferior vena cava is normal in size with greater than 50%  respiratory variability, suggesting right atrial pressure of 3 mmHg.   Stress echo 10/19/2018 (care everywhere): SUMMARY  The patient had no chest pain.  The patient achieved 88 % of maximum  predicted heart rate.  The METS achieved was 8.  Exercise capacity was average.  The baseline ECG displays normal sinus rhythm.  Negative stress ECG for inducible ischemia at target heart rate.  Normal left ventricular function at rest. Negative exercise echocardiography  for inducible ischemia at target heart rate.  -   )        Anesthesia Quick Evaluation

## 2022-02-09 ENCOUNTER — Ambulatory Visit (HOSPITAL_COMMUNITY): Payer: No Typology Code available for payment source | Admitting: Physician Assistant

## 2022-02-09 ENCOUNTER — Encounter: Payer: Self-pay | Admitting: Obstetrics and Gynecology

## 2022-02-09 ENCOUNTER — Encounter (HOSPITAL_COMMUNITY)
Admission: RE | Disposition: A | Discharge status: Home / Self Care | Payer: Self-pay | Source: Home / Self Care | Attending: Obstetrics and Gynecology

## 2022-02-09 ENCOUNTER — Ambulatory Visit (HOSPITAL_BASED_OUTPATIENT_CLINIC_OR_DEPARTMENT_OTHER): Payer: No Typology Code available for payment source | Admitting: Physician Assistant

## 2022-02-09 ENCOUNTER — Other Ambulatory Visit (HOSPITAL_COMMUNITY): Payer: Self-pay

## 2022-02-09 ENCOUNTER — Other Ambulatory Visit: Payer: Self-pay

## 2022-02-09 ENCOUNTER — Encounter (HOSPITAL_COMMUNITY): Payer: Self-pay | Admitting: Obstetrics and Gynecology

## 2022-02-09 ENCOUNTER — Ambulatory Visit (HOSPITAL_COMMUNITY)
Admission: RE | Admit: 2022-02-09 | Discharge: 2022-02-09 | Disposition: A | Discharge status: Home / Self Care | Payer: No Typology Code available for payment source | Attending: Obstetrics and Gynecology | Admitting: Obstetrics and Gynecology

## 2022-02-09 DIAGNOSIS — N938 Other specified abnormal uterine and vaginal bleeding: Secondary | ICD-10-CM

## 2022-02-09 DIAGNOSIS — I951 Orthostatic hypotension: Secondary | ICD-10-CM | POA: Diagnosis not present

## 2022-02-09 DIAGNOSIS — Z975 Presence of (intrauterine) contraceptive device: Secondary | ICD-10-CM | POA: Diagnosis not present

## 2022-02-09 DIAGNOSIS — G43B0 Ophthalmoplegic migraine, not intractable: Secondary | ICD-10-CM | POA: Diagnosis not present

## 2022-02-09 DIAGNOSIS — N840 Polyp of corpus uteri: Secondary | ICD-10-CM | POA: Insufficient documentation

## 2022-02-09 DIAGNOSIS — T8332XA Displacement of intrauterine contraceptive device, initial encounter: Secondary | ICD-10-CM

## 2022-02-09 DIAGNOSIS — R002 Palpitations: Secondary | ICD-10-CM

## 2022-02-09 DIAGNOSIS — I7300 Raynaud's syndrome without gangrene: Secondary | ICD-10-CM | POA: Insufficient documentation

## 2022-02-09 DIAGNOSIS — N924 Excessive bleeding in the premenopausal period: Secondary | ICD-10-CM

## 2022-02-09 DIAGNOSIS — Z30432 Encounter for removal of intrauterine contraceptive device: Secondary | ICD-10-CM | POA: Diagnosis not present

## 2022-02-09 DIAGNOSIS — N939 Abnormal uterine and vaginal bleeding, unspecified: Secondary | ICD-10-CM | POA: Diagnosis not present

## 2022-02-09 DIAGNOSIS — I10 Essential (primary) hypertension: Secondary | ICD-10-CM | POA: Diagnosis not present

## 2022-02-09 HISTORY — DX: Hypothyroidism, unspecified: E03.9

## 2022-02-09 HISTORY — PX: DILITATION & CURRETTAGE/HYSTROSCOPY WITH NOVASURE ABLATION: SHX5568

## 2022-02-09 LAB — BASIC METABOLIC PANEL
Anion gap: 9 (ref 5–15)
BUN: 11 mg/dL (ref 6–20)
CO2: 28 mmol/L (ref 22–32)
Calcium: 9.8 mg/dL (ref 8.9–10.3)
Chloride: 102 mmol/L (ref 98–111)
Creatinine, Ser: 0.7 mg/dL (ref 0.44–1.00)
GFR, Estimated: >60 mL/min (ref >60)
Glucose, Bld: 85 mg/dL (ref 70–99)
Potassium: 4.2 mmol/L (ref 3.5–5.1)
Sodium: 139 mmol/L (ref 135–145)

## 2022-02-09 LAB — CBC
HCT: 43.8 % (ref 36.0–46.0)
Hemoglobin: 14.6 g/dL (ref 12.0–15.0)
MCH: 32.7 pg (ref 26.0–34.0)
MCHC: 33.3 g/dL (ref 30.0–36.0)
MCV: 98 fL (ref 80.0–100.0)
Platelets: 265 10*3/uL (ref 150–400)
RBC: 4.47 MIL/uL (ref 3.87–5.11)
RDW: 12.7 % (ref 11.5–15.5)
WBC: 5 10*3/uL (ref 4.0–10.5)
nRBC: 0 % (ref 0.0–0.2)

## 2022-02-09 LAB — POCT PREGNANCY, URINE: Preg Test, Ur: NEGATIVE

## 2022-02-09 LAB — TYPE AND SCREEN
ABO/RH(D): A POS
Antibody Screen: NEGATIVE

## 2022-02-09 LAB — ABO/RH: ABO/RH(D): A POS

## 2022-02-09 SURGERY — DILATATION & CURETTAGE/HYSTEROSCOPY WITH NOVASURE ABLATION
Anesthesia: General

## 2022-02-09 MED ORDER — FENTANYL CITRATE (PF) 100 MCG/2ML IJ SOLN
INTRAMUSCULAR | Status: AC
  Filled 2022-02-09: qty 2

## 2022-02-09 MED ORDER — ONDANSETRON HCL 4 MG/2ML IJ SOLN
INTRAMUSCULAR | Status: DC | PRN
  Administered 2022-02-09: 4 mg via INTRAVENOUS

## 2022-02-09 MED ORDER — ACETAMINOPHEN 500 MG PO TABS
1000.0000 mg | ORAL_TABLET | ORAL | Status: AC
  Administered 2022-02-09: 1000 mg via ORAL
  Filled 2022-02-09: qty 2

## 2022-02-09 MED ORDER — ORAL CARE MOUTH RINSE: 15.0000 mL | Freq: Once | OROMUCOSAL | Status: AC

## 2022-02-09 MED ORDER — LIDOCAINE 2% (20 MG/ML) 5 ML SYRINGE
INTRAMUSCULAR | Status: DC | PRN
  Administered 2022-02-09: 60 mg via INTRAVENOUS

## 2022-02-09 MED ORDER — PROPOFOL 10 MG/ML IV BOLUS
INTRAVENOUS | Status: DC | PRN
  Administered 2022-02-09: 130 mg via INTRAVENOUS

## 2022-02-09 MED ORDER — LACTATED RINGERS IV SOLN: INTRAVENOUS | Status: DC

## 2022-02-09 MED ORDER — MIDAZOLAM HCL 2 MG/2ML IJ SOLN
INTRAMUSCULAR | Status: DC | PRN
  Administered 2022-02-09: 2 mg via INTRAVENOUS

## 2022-02-09 MED ORDER — POVIDONE-IODINE 10 % EX SWAB: 2.0000 | Freq: Once | CUTANEOUS | Status: DC

## 2022-02-09 MED ORDER — LIDOCAINE 2% (20 MG/ML) 5 ML SYRINGE
INTRAMUSCULAR | Status: AC
  Filled 2022-02-09: qty 5

## 2022-02-09 MED ORDER — PROPOFOL 10 MG/ML IV BOLUS
INTRAVENOUS | Status: AC
  Filled 2022-02-09: qty 20

## 2022-02-09 MED ORDER — ONDANSETRON HCL 4 MG/2ML IJ SOLN
INTRAMUSCULAR | Status: AC
  Filled 2022-02-09: qty 2

## 2022-02-09 MED ORDER — CHLORHEXIDINE GLUCONATE 0.12 % MT SOLN
15.0000 mL | Freq: Once | OROMUCOSAL | Status: AC
  Administered 2022-02-09: 15 mL via OROMUCOSAL
  Filled 2022-02-09: qty 15

## 2022-02-09 MED ORDER — DEXAMETHASONE SODIUM PHOSPHATE 10 MG/ML IJ SOLN
INTRAMUSCULAR | Status: DC | PRN
  Administered 2022-02-09: 10 mg via INTRAVENOUS

## 2022-02-09 MED ORDER — FENTANYL CITRATE (PF) 250 MCG/5ML IJ SOLN
INTRAMUSCULAR | Status: AC
  Filled 2022-02-09: qty 5

## 2022-02-09 MED ORDER — ACETAMINOPHEN 500 MG PO TABS
500.0000 mg | ORAL_TABLET | Freq: Four times a day (QID) | ORAL | 0 refills | Status: DC | PRN
  Filled 2022-02-09: qty 30, 8d supply, fill #0

## 2022-02-09 MED ORDER — FENTANYL CITRATE (PF) 100 MCG/2ML IJ SOLN
25.0000 ug | INTRAMUSCULAR | Status: DC | PRN
  Administered 2022-02-09 (×2): 50 ug via INTRAVENOUS

## 2022-02-09 MED ORDER — SODIUM CHLORIDE 0.9 % IR SOLN
Status: DC | PRN
  Administered 2022-02-09: 3000 mL

## 2022-02-09 MED ORDER — IBUPROFEN 800 MG PO TABS
800.0000 mg | ORAL_TABLET | Freq: Three times a day (TID) | ORAL | 0 refills | Status: DC | PRN
  Filled 2022-02-09: qty 60, 20d supply, fill #0

## 2022-02-09 MED ORDER — FENTANYL CITRATE (PF) 100 MCG/2ML IJ SOLN
INTRAMUSCULAR | Status: DC | PRN
  Administered 2022-02-09: 50 ug via INTRAVENOUS

## 2022-02-09 MED ORDER — DEXAMETHASONE SODIUM PHOSPHATE 10 MG/ML IJ SOLN
INTRAMUSCULAR | Status: AC
  Filled 2022-02-09: qty 1

## 2022-02-09 MED ORDER — MIDAZOLAM HCL 2 MG/2ML IJ SOLN
INTRAMUSCULAR | Status: AC
  Filled 2022-02-09: qty 2

## 2022-02-09 MED ORDER — KETOROLAC TROMETHAMINE 15 MG/ML IJ SOLN
15.0000 mg | INTRAMUSCULAR | Status: AC
  Administered 2022-02-09: 15 mg via INTRAVENOUS
  Filled 2022-02-09: qty 1

## 2022-02-09 MED ORDER — OXYCODONE HCL 5 MG PO TABS
5.0000 mg | ORAL_TABLET | ORAL | 0 refills | Status: DC | PRN
  Filled 2022-02-09: qty 10, 2d supply, fill #0

## 2022-02-09 SURGICAL SUPPLY — 11 items
ABLATOR SURESOUND NOVASURE (ABLATOR) ×1 IMPLANT
CATH ROBINSON RED A/P 16FR (CATHETERS) IMPLANT
GLOVE SURG POLYISO LF SZ6 (GLOVE) ×1 IMPLANT
GLOVE SURG UNDER POLY LF SZ6 (GLOVE) ×1 IMPLANT
GLOVE SURG UNDER POLY LF SZ7 (GLOVE) ×1 IMPLANT
GOWN STRL REUS W/ TWL LRG LVL3 (GOWN DISPOSABLE) ×2 IMPLANT
GOWN STRL REUS W/TWL LRG LVL3 (GOWN DISPOSABLE) ×2
KIT PROCEDURE FLUENT (KITS) ×1 IMPLANT
PACK VAGINAL MINOR WOMEN LF (CUSTOM PROCEDURE TRAY) ×1 IMPLANT
PAD OB MATERNITY 4.3X12.25 (PERSONAL CARE ITEMS) ×1 IMPLANT
TOWEL GREEN STERILE FF (TOWEL DISPOSABLE) ×2 IMPLANT

## 2022-02-09 NOTE — Op Note (Addendum)
Preop: AUB Postop: Same Procedure: D&C, hysteroscopy, Novasure ablation, Removal of IUD Surgeon: Dr. Para March Assist: None EBL 5 cc IVF: 700 cc  UOP: Not drained Specimens: EMC Findings: NEFG, IUD was in proper location but was likely somewhat embedded in the uterine wall. uterus sounded to 10 cm. Cervix measured 4 cm. Cavity length 6 cm. Cavity width 3.7. Power was 122 and ablation lasted 1:02.   Description of the procedure: Preop antibiotics not indicated. Informed consent reviewed and signed. Pt given opportunity to ask questions.   Pt prepped and draped in the dorsal lithotomy fashion after LMA anesthesia found to be adequate. Timeout performed.   Weighted speculum placed into the patient's vagina. Single tooth tenaculum applied to the 12 o'clock position of the cervix. IUD strings visualized, grasped with ring forcep with some resistance noted on removal but removed intact.  Uterus sounded to 10.  Cervix progressively dilated to a 17 [place]att. Cervix was measured as well as cavity with the uterine sound - cavity length was 6cm.  30 degree hysteroscope was inserted into the cavity with the aforementioned findings noted. EMC performed. Novasure device inserted into the cavity with measured lengths noted. Cavity width obtained of 3.7. Novasure device tested and cavity intact. Ablation initiated and ablation done for 1:02 with power of 122. Device removed and hysteroscope re-inserted and homogenous char noted.   Hemostatic at the end of the procedure. Procedure completed. All instruments removed. Counts correct x2.  Pt taken to recovery room in stable condition.  Milas Hock, MD Attending Obstetrician & Gynecologist, [facility] for [facility], [facility]

## 2022-02-09 NOTE — Interval H&P Note (Signed)
History and Physical Interval Note:  02/09/2022 1:44 PM  April Webster  has presented today for surgery, with the diagnosis of AUB.  The various methods of treatment have been discussed with the patient and family. After consideration of risks, benefits and other options for treatment, the patient has consented to  Procedure(s): DILATATION & CURETTAGE/HYSTEROSCOPY WITH NOVASURE ABLATION (N/A) as a surgical intervention.  The patient's history has been reviewed, patient examined, no change in status, stable for surgery.  I have reviewed the patient's chart and labs.  Questions were answered to the patient's satisfaction.     Milas Hock

## 2022-02-09 NOTE — Anesthesia Procedure Notes (Signed)
Procedure Name: LMA Insertion Date/Time: 02/09/2022 2:18 PM  Performed by: Epifanio Lesches, CRNAPre-anesthesia Checklist: Patient identified, Emergency Drugs available, Suction available, Timeout performed and Patient being monitored Patient Re-evaluated:Patient Re-evaluated prior to induction Oxygen Delivery Method: Circle system utilized Preoxygenation: Pre-oxygenation with 100% oxygen Induction Type: IV induction Ventilation: Mask ventilation without difficulty LMA: LMA inserted LMA Size: 4.0 Tube type: Oral Placement Confirmation: positive ETCO2, CO2 detector and breath sounds checked- equal and bilateral Tube secured with: Tape Dental Injury: Teeth and Oropharynx as per pre-operative assessment

## 2022-02-09 NOTE — Transfer of Care (Signed)
Immediate Anesthesia Transfer of Care Note  Patient: April Webster  Procedure(s) Performed: DILATATION & CURETTAGE/HYSTEROSCOPY WITH NOVASURE ABLATION, REMOVAL OF IUD  Patient Location: PACU  Anesthesia Type:General  Level of Consciousness: awake  Airway & Oxygen Therapy: Patient Spontanous Breathing  Post-op Assessment: Report given to RN and Post -op Vital signs reviewed and stable  Post vital signs: Reviewed and stable  Last Vitals:  Vitals Value Taken Time  BP 120/77 02/09/22 1500  Temp    Pulse 82 02/09/22 1500  Resp    SpO2 92 % 02/09/22 1500  Vitals shown include unvalidated device data.  Last Pain:  Vitals:   02/09/22 1234  TempSrc:   PainSc: 6       Patients Stated Pain Goal: 3 (02/09/22 1234)  Complications: No notable events documented.

## 2022-02-09 NOTE — Anesthesia Postprocedure Evaluation (Signed)
Anesthesia Post Note  Patient: April Webster  Procedure(s) Performed: DILATATION & CURETTAGE/HYSTEROSCOPY WITH NOVASURE ABLATION, REMOVAL OF IUD     Patient location during evaluation: PACU Anesthesia Type: General Level of consciousness: awake and alert Pain management: pain level controlled Vital Signs Assessment: post-procedure vital signs reviewed and stable Respiratory status: spontaneous breathing, nonlabored ventilation, respiratory function stable and patient connected to nasal cannula oxygen Cardiovascular status: blood pressure returned to baseline and stable Postop Assessment: no apparent nausea or vomiting Anesthetic complications: no   No notable events documented.  Last Vitals:  Vitals:   02/09/22 1530 02/09/22 1545  BP: 129/77 129/82  Pulse: 79 69  Resp: 15 14  Temp:  36.6 C  SpO2: 95% 96%    Last Pain:  Vitals:   02/09/22 1545  TempSrc:   PainSc: 3                  April Webster P Oriah Leinweber

## 2022-02-10 ENCOUNTER — Encounter (HOSPITAL_COMMUNITY): Payer: Self-pay | Admitting: Obstetrics and Gynecology

## 2022-02-11 LAB — SURGICAL PATHOLOGY

## 2022-02-14 ENCOUNTER — Other Ambulatory Visit: Payer: Self-pay | Admitting: Family Medicine

## 2022-02-14 DIAGNOSIS — F5101 Primary insomnia: Secondary | ICD-10-CM

## 2022-02-15 ENCOUNTER — Other Ambulatory Visit (HOSPITAL_COMMUNITY): Payer: Self-pay

## 2022-02-15 MED ORDER — TRAZODONE HCL 50 MG PO TABS
25.0000 mg | ORAL_TABLET | Freq: Every day | ORAL | 3 refills | Status: DC
  Filled 2022-02-15: qty 60, 30d supply, fill #0
  Filled 2022-04-03: qty 60, 30d supply, fill #1
  Filled 2022-07-18: qty 60, 30d supply, fill #2
  Filled 2022-09-05: qty 60, 30d supply, fill #3

## 2022-02-16 ENCOUNTER — Other Ambulatory Visit (HOSPITAL_COMMUNITY): Payer: Self-pay

## 2022-02-16 ENCOUNTER — Telehealth: Payer: Self-pay | Admitting: Family Medicine

## 2022-02-16 MED ORDER — ONDANSETRON HCL 4 MG PO TABS
4.0000 mg | ORAL_TABLET | Freq: Three times a day (TID) | ORAL | 0 refills | Status: DC | PRN
  Filled 2022-02-16: qty 21, 7d supply, fill #0

## 2022-02-16 NOTE — Telephone Encounter (Signed)
New start medication for mood.  It started around that time.  Prescription for Zofran sent to pharmacy for as needed use.  Hopefully will improve over the next week or 2.

## 2022-02-26 ENCOUNTER — Other Ambulatory Visit: Payer: Self-pay | Admitting: Family Medicine

## 2022-02-26 ENCOUNTER — Other Ambulatory Visit: Payer: Self-pay

## 2022-02-26 ENCOUNTER — Other Ambulatory Visit (HOSPITAL_COMMUNITY): Payer: Self-pay

## 2022-02-26 DIAGNOSIS — K219 Gastro-esophageal reflux disease without esophagitis: Secondary | ICD-10-CM

## 2022-02-26 MED ORDER — PANTOPRAZOLE SODIUM 20 MG PO TBEC
20.0000 mg | DELAYED_RELEASE_TABLET | Freq: Every day | ORAL | 3 refills | Status: DC
  Filled 2022-02-26: qty 90, fill #0
  Filled 2022-02-26: qty 90, 90d supply, fill #0
  Filled 2022-06-24: qty 90, 90d supply, fill #1
  Filled 2022-10-10: qty 90, 90d supply, fill #2
  Filled 2023-01-12: qty 90, 90d supply, fill #3

## 2022-02-27 ENCOUNTER — Other Ambulatory Visit (HOSPITAL_COMMUNITY): Payer: Self-pay

## 2022-03-03 ENCOUNTER — Other Ambulatory Visit (HOSPITAL_COMMUNITY): Payer: Self-pay

## 2022-03-04 NOTE — Telephone Encounter (Signed)
Orders Placed This Encounter  Procedures   TSH + free T4

## 2022-03-05 ENCOUNTER — Other Ambulatory Visit: Payer: Self-pay | Admitting: Sports Medicine

## 2022-03-05 ENCOUNTER — Other Ambulatory Visit: Payer: Self-pay

## 2022-03-05 DIAGNOSIS — E039 Hypothyroidism, unspecified: Secondary | ICD-10-CM

## 2022-03-05 LAB — TSH: TSH: 0.43 mIU/L

## 2022-03-05 MED ORDER — SYNTHROID 112 MCG PO TABS
112.0000 ug | ORAL_TABLET | Freq: Every day | ORAL | 11 refills | Status: DC
  Filled 2022-03-05: qty 30, 30d supply, fill #0

## 2022-03-05 NOTE — Assessment & Plan Note (Signed)
TSH is bottom of the normal range, this is 125 mcg levothyroxine, 100 mcg levothyroxine left her with a high TSH. We will drop her down to 112 mcg with a 6-week recheck.

## 2022-03-24 ENCOUNTER — Other Ambulatory Visit: Payer: Self-pay | Admitting: Family Medicine

## 2022-03-24 ENCOUNTER — Other Ambulatory Visit (HOSPITAL_COMMUNITY): Payer: Self-pay

## 2022-03-24 ENCOUNTER — Other Ambulatory Visit: Payer: Self-pay

## 2022-03-24 DIAGNOSIS — E039 Hypothyroidism, unspecified: Secondary | ICD-10-CM

## 2022-03-24 MED ORDER — SYNTHROID 112 MCG PO TABS
112.0000 ug | ORAL_TABLET | Freq: Every day | ORAL | 4 refills | Status: DC
  Filled 2022-03-24 – 2022-04-02 (×3): qty 90, 90d supply, fill #0
  Filled 2022-04-02: qty 30, 30d supply, fill #0
  Filled 2022-04-08: qty 90, 90d supply, fill #0
  Filled ????-??-??: fill #0

## 2022-03-29 ENCOUNTER — Telehealth: Payer: Self-pay

## 2022-03-29 ENCOUNTER — Other Ambulatory Visit: Payer: Self-pay

## 2022-03-29 ENCOUNTER — Other Ambulatory Visit (HOSPITAL_COMMUNITY): Payer: Self-pay

## 2022-03-29 NOTE — Telephone Encounter (Addendum)
Initiated Prior authorization TDV:[id number] 112MCG tablets Via: Covermymeds Case/Key:[id number] Status: approved  as of 03/29/21 Reason:The request has been approved. The authorization is effective from 03/31/2022 to 03/31/2023 Notified Pt via: [place] tier reduction review /co-pay exception Called  [employer] /clinical review 7-10 days Case number:[id number],  Pt has a direct [email or web address]

## 2022-04-02 ENCOUNTER — Other Ambulatory Visit (HOSPITAL_COMMUNITY): Payer: Self-pay

## 2022-04-04 ENCOUNTER — Other Ambulatory Visit: Payer: Self-pay | Admitting: Family Medicine

## 2022-04-05 ENCOUNTER — Other Ambulatory Visit (HOSPITAL_COMMUNITY): Payer: Self-pay

## 2022-04-05 MED ORDER — ONDANSETRON HCL 4 MG PO TABS
4.0000 mg | ORAL_TABLET | Freq: Three times a day (TID) | ORAL | 0 refills | Status: DC | PRN
  Filled 2022-04-05: qty 21, 7d supply, fill #0

## 2022-04-08 ENCOUNTER — Other Ambulatory Visit (HOSPITAL_COMMUNITY): Payer: Self-pay

## 2022-04-09 ENCOUNTER — Other Ambulatory Visit (HOSPITAL_COMMUNITY): Payer: Self-pay

## 2022-04-16 ENCOUNTER — Other Ambulatory Visit (HOSPITAL_BASED_OUTPATIENT_CLINIC_OR_DEPARTMENT_OTHER): Payer: Self-pay

## 2022-04-16 ENCOUNTER — Other Ambulatory Visit: Payer: Self-pay | Admitting: Sports Medicine

## 2022-04-16 MED ORDER — OSELTAMIVIR PHOSPHATE 75 MG PO CAPS
75.0000 mg | ORAL_CAPSULE | Freq: Two times a day (BID) | ORAL | 0 refills | Status: DC
  Filled 2022-04-16: qty 10, 5d supply, fill #0

## 2022-04-23 ENCOUNTER — Other Ambulatory Visit (HOSPITAL_BASED_OUTPATIENT_CLINIC_OR_DEPARTMENT_OTHER): Payer: Self-pay

## 2022-04-23 ENCOUNTER — Telehealth: Payer: Self-pay | Admitting: Family Medicine

## 2022-04-23 MED ORDER — AZITHROMYCIN 250 MG PO TABS
ORAL_TABLET | ORAL | 0 refills | Status: DC
  Filled 2022-04-23: qty 6, 5d supply, fill #0

## 2022-04-23 NOTE — Telephone Encounter (Signed)
Sick x 8 week of right sided sinus pain and drainage and congestion.  No fever.  Started about 5 days after getting over [place].  Not improvng. Raspy voice for several days.  Will tx for acute sinusitis.    Meds ordered this encounter  Medications   azithromycin (ZITHROMAX) 250 MG tablet    Sig: 2 Ttabs PO on Day 1, then one a day x 4 days.    Dispense:  6 tablet    Refill:  0

## 2022-04-28 ENCOUNTER — Ambulatory Visit: Payer: 59 | Admitting: Gastroenterology

## 2022-04-28 ENCOUNTER — Encounter: Payer: Self-pay | Admitting: Gastroenterology

## 2022-04-28 ENCOUNTER — Other Ambulatory Visit: Payer: Self-pay | Admitting: Family Medicine

## 2022-04-28 VITALS — BP 118/68 | HR 70 | Ht 63.0 in | Wt 129.0 lb

## 2022-04-28 DIAGNOSIS — R1013 Epigastric pain: Secondary | ICD-10-CM | POA: Diagnosis not present

## 2022-04-28 DIAGNOSIS — R110 Nausea: Secondary | ICD-10-CM | POA: Diagnosis not present

## 2022-04-28 DIAGNOSIS — K5909 Other constipation: Secondary | ICD-10-CM

## 2022-04-28 DIAGNOSIS — Z1211 Encounter for screening for malignant neoplasm of colon: Secondary | ICD-10-CM

## 2022-04-28 DIAGNOSIS — E039 Hypothyroidism, unspecified: Secondary | ICD-10-CM

## 2022-04-28 MED ORDER — SYNTHROID 112 MCG PO TABS: 112.0000 ug | ORAL_TABLET | Freq: Every day | ORAL | 4 refills | Status: DC

## 2022-04-28 MED ORDER — CLENPIQ 10-3.5-12 MG-GM -GM/175ML PO SOLN
1.0000 | Freq: Once | ORAL | 0 refills | Status: AC
  Filled 2022-04-28: qty 350, 2d supply, fill #0

## 2022-04-28 NOTE — Patient Instructions (Addendum)
We have sent the following medications to your pharmacy for you to pick up at your convenience: Clenpiq  SITZMARKS (Simplified Transit Test) On day One: Take one SITZMARKS capsule by mouth with water. Use no laxatives, enemas, or suppositories for 5 days. On day Five: Go to Radiology and have your abdominal xray.  We will contact you with the results with in several days.   You have been scheduled for an endoscopy and colonoscopy. Please follow the written instructions given to you at your visit today. Please pick up your prep supplies at the pharmacy within the next 1-3 days. If you use inhalers (even only as needed), please bring them with you on the day of your procedure.     _______________________________________________________  If your blood pressure at your visit was 140/90 or greater, please contact your primary care physician to follow up on this.  _______________________________________________________  If you are age 72 or younger, your body mass index should be between 19-25. Your Body mass index is 22.85 kg/m. If this is out of the aformentioned range listed, please consider follow up with your Primary Care Provider.   __________________________________________________________  The [facility] providers would like to encourage you to use [facility] to communicate with providers for non-urgent requests or questions.  Due to long hold times on the telephone, sending your provider a message by [facility] may be a faster and more efficient way to get a response.  Please allow 48 business hours for a response.  Please remember that this is for non-urgent requests.   Due to recent changes in healthcare laws, you may see the results of your imaging and laboratory studies on MyChart before your provider has had a chance to review them.  We understand that in some cases there may be results that are confusing or concerning to you. Not all laboratory results come back in the same time frame  and the provider may be waiting for multiple results in order to interpret others.  Please give [place] 48 hours in order for your provider to thoroughly review all the results before contacting the office for clarification of your results.    Thank you for choosing me and [facility].  Vito Cirigliano, D.O.

## 2022-04-28 NOTE — Progress Notes (Unsigned)
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  1) Elevated liver enzymes.  Initially seen in GI clinic in 12/2020 for liver enzymes. ANA positive at 1:80 (nuclear, speckled pattern).  Otherwise normal/negative extended serologic workup.  No change with statin hold.  Was unclear if this was due to intensive exercise regimen, so this was halted. -11/13/2020: RUQ [place]: s/p ccy otherwise normal - 05/20/2021: Evaluated at [facility] ***  2) Constipation:  longstanding history of chronic constipation.  Initially controlled with Linzess 72 mcg/day, and eventually had to titrate up to 145 mcg/day, but eventually decreased efficacy, now requiring fiber supplement and Senokot 4 times/week.  No previous colonoscopy. - 12/2020: Started Linzess 290 mcg/day  HPI:     Patient is a 47 y.o. female presenting to the Gastroenterology Clinic for evaluation of constipation and nausea.  For constipation, she is trialed senna, and different OTC laxatives without much improvement. Now Amitiza- some abdominal cramping with the AM dose, but worse (and nausea) with PM dose. Added Senakot 2/day. Can go all week without BM  Dulcolax    Nausea has been present for the last several months. No change with Protonix trial. Zofran QAM for months now.   Trintillex?  Was seen in the Cardiology Clinic on 01/06/2022 for evaluation of orthostatic hypotension.  Previously had hypertension.  Normal CT cardiac scoring study.   Review of systems:     No chest pain, no SOB, no fevers, no urinary sx   Past Medical History:  Diagnosis Date   Chronic idiopathic constipation    DDD (degenerative disc disease), cervical and lumbar    Female pattern hair loss    Graves disease    Hyperlipidemia    Hypertension    Hypothyroidism    Lichen sclerosus    Raynaud's disease      Patient's surgical history, family medical history, social history, medications and allergies were all reviewed in Epic    Current Outpatient Medications  Medication Sig Dispense Refill   albuterol (VENTOLIN HFA) 108 (90 Base) MCG/ACT inhaler Inhale 2 puffs into the lungs every 6 (six) hours as needed for wheezing or shortness of breath. 18 g 2   atorvastatin (LIPITOR) 20 MG tablet Take 1 tablet (20 mg total) by mouth daily. 90 tablet 3   baclofen (LIORESAL) 10 MG tablet TAKE 1 TABLET  BY MOUTH AT BEDTIME. 90 tablet 3   clobetasol ointment (TEMOVATE) [id number] % Apply 1 application topically daily as needed for rash. LIchen sclerosis     finasteride (PROSCAR) 5 MG tablet TAKE 1/2 TABLET (2.5 MG TOTAL) BY MOUTH DAILY. 45 tablet 3   lubiprostone (AMITIZA) 24 MCG capsule Take 1 capsule (24 mcg total) by mouth 2 (two) times daily with a meal. (Patient taking differently: Take 24 mcg by mouth daily with breakfast.) 180 capsule 1   melatonin 5 MG TABS Take 5 mg by mouth at bedtime as needed (sleep).     meloxicam (MOBIC) 15 MG tablet Take 1 tablet (15 mg total) by mouth daily. (Patient taking differently: Take 15 mg by mouth at bedtime.) 90 tablet 1   Multiple Vitamins-Minerals (MULTIVITAMIN WITH MINERALS) tablet Take 1 tablet by mouth daily.     ondansetron (ZOFRAN) 4 MG tablet Take 1 tablet (4 mg total) by mouth every 8 (eight) hours as needed for nausea or vomiting. 21 tablet 0   pantoprazole (PROTONIX)  20 MG tablet Take 1 tablet (20 mg total) by mouth daily. 90 tablet 3   sennosides-docusate sodium (SENOKOT-S) 8.6-50 MG tablet Take 1 tablet by mouth daily as needed for constipation.     SYNTHROID 112 MCG tablet Take 1 tablet (112 mcg total) by mouth daily before breakfast. 90 tablet 4   traZODone (DESYREL) 50 MG tablet Take 0.5-2 tablets (25-100 mg total) by mouth at bedtime. 60 tablet 3   vortioxetine HBr (TRINTELLIX) 10 MG TABS tablet Take 1 tablet (10 mg total) by mouth daily. 30 tablet 11    No current facility-administered medications for this visit.    Physical Exam:     BP 118/68   Pulse 70   Ht 5' 3"$  (1.6 m)   Wt 129 lb (58.5 kg)   BMI 22.85 kg/m   GENERAL:  Pleasant *** in NAD PSYCH: : Cooperative, normal affect EENT:  conjunctiva pink, mucous membranes moist, neck supple without masses CARDIAC:  RRR, ***murmur heard, no peripheral edema PULM: Normal respiratory effort, lungs CTA bilaterally, no wheezing ABDOMEN:  Nondistended, soft, nontender. No obvious masses, no hepatomegaly,  normal bowel sounds SKIN:  turgor, no lesions seen Musculoskeletal:  Normal muscle tone, normal strength NEURO: Alert and oriented x 3, no focal neurologic deficits   IMPRESSION and PLAN:    1)   Sitz marker EGD [place] ,DO, FACG 04/28/2022, 3:20 PM

## 2022-04-28 NOTE — Progress Notes (Signed)
Meds ordered this encounter  Medications   SYNTHROID 112 MCG tablet    Sig: Take 1 tablet (112 mcg total) by mouth daily before breakfast.    Dispense:  90 tablet    Refill:  4

## 2022-04-29 ENCOUNTER — Other Ambulatory Visit (HOSPITAL_COMMUNITY): Payer: Self-pay

## 2022-04-29 ENCOUNTER — Other Ambulatory Visit: Payer: Self-pay

## 2022-04-30 ENCOUNTER — Other Ambulatory Visit: Payer: Self-pay

## 2022-05-02 ENCOUNTER — Other Ambulatory Visit: Payer: Self-pay | Admitting: Sports Medicine

## 2022-05-02 DIAGNOSIS — M542 Cervicalgia: Secondary | ICD-10-CM

## 2022-05-04 ENCOUNTER — Ambulatory Visit (INDEPENDENT_AMBULATORY_CARE_PROVIDER_SITE_OTHER): Payer: 59

## 2022-05-04 ENCOUNTER — Encounter: Payer: Self-pay | Admitting: Gastroenterology

## 2022-05-04 DIAGNOSIS — Z9049 Acquired absence of other specified parts of digestive tract: Secondary | ICD-10-CM | POA: Diagnosis not present

## 2022-05-04 DIAGNOSIS — R1013 Epigastric pain: Secondary | ICD-10-CM | POA: Diagnosis not present

## 2022-05-04 DIAGNOSIS — R110 Nausea: Secondary | ICD-10-CM

## 2022-05-04 DIAGNOSIS — K5909 Other constipation: Secondary | ICD-10-CM | POA: Diagnosis not present

## 2022-05-06 ENCOUNTER — Telehealth: Payer: Self-pay | Admitting: Family Medicine

## 2022-05-06 DIAGNOSIS — M549 Dorsalgia, unspecified: Secondary | ICD-10-CM

## 2022-05-06 DIAGNOSIS — M47816 Spondylosis without myelopathy or radiculopathy, lumbar region: Secondary | ICD-10-CM

## 2022-05-06 DIAGNOSIS — M542 Cervicalgia: Secondary | ICD-10-CM
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
## 2022-05-10 ENCOUNTER — Ambulatory Visit (INDEPENDENT_AMBULATORY_CARE_PROVIDER_SITE_OTHER): Payer: 59 | Admitting: Family Medicine

## 2022-05-10 ENCOUNTER — Encounter: Payer: Self-pay | Admitting: Family Medicine

## 2022-05-10 ENCOUNTER — Other Ambulatory Visit: Payer: Self-pay

## 2022-05-10 ENCOUNTER — Other Ambulatory Visit (HOSPITAL_COMMUNITY): Payer: Self-pay

## 2022-05-10 VITALS — BP 105/70 | HR 73 | Temp 98.6°F | Ht 63.0 in | Wt 124.8 lb

## 2022-05-10 DIAGNOSIS — M542 Cervicalgia: Secondary | ICD-10-CM

## 2022-05-10 DIAGNOSIS — E039 Hypothyroidism, unspecified: Secondary | ICD-10-CM

## 2022-05-10 DIAGNOSIS — Z0000 Encounter for general adult medical examination without abnormal findings: Secondary | ICD-10-CM | POA: Diagnosis not present

## 2022-05-10 MED ORDER — FINASTERIDE 5 MG PO TABS
2.5000 mg | ORAL_TABLET | Freq: Every day | ORAL | 3 refills | Status: DC
  Filled 2022-05-10: qty 45, 90d supply, fill #0
  Filled 2022-08-04: qty 45, 90d supply, fill #1
  Filled 2022-11-03: qty 45, 90d supply, fill #2
  Filled 2023-02-07: qty 45, 90d supply, fill #3

## 2022-05-10 MED ORDER — BACLOFEN 10 MG PO TABS
10.0000 mg | ORAL_TABLET | Freq: Every day | ORAL | 1 refills | Status: DC
  Filled 2022-05-10: qty 90, 90d supply, fill #0

## 2022-05-10 MED ORDER — ATORVASTATIN CALCIUM 20 MG PO TABS
20.0000 mg | ORAL_TABLET | Freq: Every day | ORAL | 3 refills | Status: DC
  Filled 2022-05-10 – 2022-07-18 (×2): qty 90, 90d supply, fill #0
  Filled 2022-10-19: qty 90, 90d supply, fill #1
  Filled 2023-01-12: qty 90, 90d supply, fill #2
  Filled 2023-04-12: qty 90, 90d supply, fill #3

## 2022-05-10 NOTE — Progress Notes (Signed)
Complete physical exam  Patient: April Webster   DOB: Jul 03, 1975   47 y.o. Female  MRN: [id number]  Subjective:    No chief complaint on file.   April Webster is a 47 y.o. female who presents today for a complete physical exam. She reports consuming a general diet.  Wokring out 3 days per week  She generally feels fairly well. She reports sleeping fairly well, just wakes up feeling exhausted.  Has been using the trazodone for sleep and it does help.. She does have additional problems to discuss today.   Is scheduled for colonoscopy and EGD coming up for persistent nausea and constipation.  Most recent fall risk assessment:    08/14/2020   11:44 AM  Fall Risk   Falls in the past year? 0  Number falls in past yr: 0  Injury with Fall? 0  Risk for fall due to : No Fall Risks  Follow up Falls prevention discussed;Falls evaluation completed     Most recent depression screenings:    01/21/2022    2:00 PM 08/14/2020   11:59 AM  PHQ 2/9 Scores  PHQ - 2 Score 6 0  PHQ- 9 Score 17 6        Patient Care Team: Hali Marry, MD as PCP - General (Family Medicine)   Outpatient Medications Prior to Visit  Medication Sig   albuterol (VENTOLIN HFA) 108 (90 Base) MCG/ACT inhaler Inhale 2 puffs into the lungs every 6 (six) hours as needed for wheezing or shortness of breath.   clobetasol ointment (TEMOVATE) [id number] % Apply 1 application topically daily as needed for rash. LIchen sclerosis   lubiprostone (AMITIZA) 24 MCG capsule Take 1 capsule (24 mcg total) by mouth 2 (two) times daily with a meal. (Patient taking differently: Take 24 mcg by mouth daily with breakfast.)   melatonin 5 MG TABS Take 5 mg by mouth at bedtime as needed (sleep).   meloxicam (MOBIC) 15 MG tablet Take 1 tablet (15 mg total) by mouth daily. (Patient taking differently: Take 15 mg by mouth at bedtime.)   Multiple Vitamins-Minerals (MULTIVITAMIN WITH MINERALS) tablet Take 1 tablet by mouth daily.   ondansetron  (ZOFRAN) 4 MG tablet Take 1 tablet (4 mg total) by mouth every 8 (eight) hours as needed for nausea or vomiting.   pantoprazole (PROTONIX) 20 MG tablet Take 1 tablet (20 mg total) by mouth daily.   sennosides-docusate sodium (SENOKOT-S) 8.6-50 MG tablet Take 1 tablet by mouth daily as needed for constipation.   SYNTHROID 112 MCG tablet Take 1 tablet (112 mcg total) by mouth daily before breakfast.   traZODone (DESYREL) 50 MG tablet Take 0.5-2 tablets (25-100 mg total) by mouth at bedtime.   vortioxetine HBr (TRINTELLIX) 10 MG TABS tablet Take 1 tablet (10 mg total) by mouth daily.   [DISCONTINUED] atorvastatin (LIPITOR) 20 MG tablet Take 1 tablet (20 mg total) by mouth daily.   [DISCONTINUED] baclofen (LIORESAL) 10 MG tablet TAKE 1 TABLET  BY MOUTH AT BEDTIME.   [DISCONTINUED] finasteride (PROSCAR) 5 MG tablet TAKE 1/2 TABLET (2.5 MG TOTAL) BY MOUTH DAILY.   No facility-administered medications prior to visit.    ROS        Objective:     BP 105/70   Pulse 73   Temp 98.6 F (37 C)   Ht '5\' 3"'$  (1.6 m)   Wt 124 lb 12.8 oz (56.6 kg)   SpO2 99%   BMI 22.11 kg/m  Physical Exam Vitals and nursing note reviewed.  Constitutional:      Appearance: She is well-developed.  HENT:     Head: Normocephalic and atraumatic.     Right Ear: Tympanic membrane, ear canal and external ear normal.     Left Ear: Tympanic membrane, ear canal and external ear normal.     Nose: Nose normal.     Mouth/Throat:     Pharynx: Oropharynx is clear.  Eyes:     Conjunctiva/sclera: Conjunctivae normal.     Pupils: Pupils are equal, round, and reactive to light.  Neck:     Thyroid: No thyromegaly.  Cardiovascular:     Rate and Rhythm: Normal rate and regular rhythm.     Heart sounds: Normal heart sounds.  Pulmonary:     Effort: Pulmonary effort is normal.     Breath sounds: Normal breath sounds. No wheezing.  Abdominal:     General: Bowel sounds are normal.     Palpations: Abdomen is soft.      Tenderness: There is no abdominal tenderness.  Musculoskeletal:     Cervical back: Neck supple.  Lymphadenopathy:     Cervical: No cervical adenopathy.  Skin:    General: Skin is warm and dry.     Coloration: Skin is not pale.  Neurological:     Mental Status: She is alert and oriented to person, place, and time.  Psychiatric:        Behavior: Behavior normal.      No results found for any visits on 05/10/22.      Assessment & Plan:    Routine Health Maintenance and Physical Exam  Immunization History  Administered Date(s) Administered   Influenza Split 01/16/2012, 11/21/2014   Influenza, Quadrivalent, Recombinant, Inj, Pf 12/24/2021   Influenza,inj,Quad PF,6+ Mos 11/29/2016, 12/05/2017, 11/29/2019   Influenza-Unspecified 12/21/2018, 01/05/2021   PFIZER(Purple Top)SARS-COV-2 Vaccination 10/12/2019, 11/02/2019   Td 02/22/2008   Tdap 02/22/2008, 04/28/2017    Health Maintenance  Topic Date Due   HIV Screening  Never done   COVID-19 Vaccine (3 - 2023-24 season) 11/13/2021   Fecal DNA (Cologuard)  09/27/2024   PAP SMEAR-Modifier  06/05/2026   DTaP/Tdap/Td (4 - Td or Tdap) 04/29/2027   INFLUENZA VACCINE  Completed   Hepatitis C Screening  Completed   Pneumococcal Vaccine 60-43 Years old  Aged Out   HPV VACCINES  Aged Out    Discussed health benefits of physical activity, and encouraged her to engage in regular exercise appropriate for her age and condition.  Problem List Items Addressed This Visit       Endocrine   Hypothyroidism   Relevant Orders   TSH     Other   Cervical pain   Relevant Medications   baclofen (LIORESAL) 10 MG tablet   Other Visit Diagnoses     Wellness examination    -  Primary   Relevant Orders   TSH   Lipid panel       Keep up a regular exercise program and make sure you are eating a healthy diet Try to eat 4 servings of dairy a day, or if you are lactose intolerant take a calcium with vitamin D daily.  Your vaccines are up to  date.   No follow-ups on file.     Beatrice Lecher, MD

## 2022-05-12 ENCOUNTER — Other Ambulatory Visit: Payer: Self-pay | Admitting: *Deleted

## 2022-05-12 DIAGNOSIS — Z0000 Encounter for general adult medical examination without abnormal findings: Secondary | ICD-10-CM | POA: Diagnosis not present

## 2022-05-12 DIAGNOSIS — M791 Myalgia, unspecified site: Secondary | ICD-10-CM

## 2022-05-12 DIAGNOSIS — E039 Hypothyroidism, unspecified: Secondary | ICD-10-CM | POA: Diagnosis not present

## 2022-05-13 LAB — LIPID PANEL
Chol/HDL Ratio: 2.5 ratio (ref 0.0–4.4)
Cholesterol, Total: 155 mg/dL (ref 100–199)
HDL: 63 mg/dL (ref >39)
LDL Chol Calc (NIH): 75 mg/dL (ref 0–99)
Triglycerides: 90 mg/dL (ref 0–149)
VLDL Cholesterol Cal: 17 mg/dL (ref 5–40)

## 2022-05-13 LAB — CK: Total CK: 86 U/L (ref 32–182)

## 2022-05-13 LAB — TSH: TSH: 0.88 u[IU]/mL (ref 0.450–4.500)

## 2022-05-13 NOTE — Progress Notes (Signed)
Your TSH look good. Recheck in 6 months.  Lipids at goal.  LDL in the 70s.

## 2022-05-13 NOTE — Progress Notes (Signed)
Hi Kemia, CK looks great

## 2022-05-19 ENCOUNTER — Ambulatory Visit: Payer: 59 | Attending: Family Medicine | Admitting: Physical Therapy

## 2022-05-19 ENCOUNTER — Other Ambulatory Visit: Payer: Self-pay

## 2022-05-19 ENCOUNTER — Encounter: Payer: Self-pay | Admitting: Physical Therapy

## 2022-05-19 DIAGNOSIS — M542 Cervicalgia: Secondary | ICD-10-CM | POA: Diagnosis not present

## 2022-05-19 DIAGNOSIS — R293 Abnormal posture: Secondary | ICD-10-CM | POA: Diagnosis not present

## 2022-05-19 DIAGNOSIS — M6281 Muscle weakness (generalized): Secondary | ICD-10-CM

## 2022-05-19 DIAGNOSIS — M549 Dorsalgia, unspecified: Secondary | ICD-10-CM | POA: Diagnosis not present

## 2022-05-19 DIAGNOSIS — M47816 Spondylosis without myelopathy or radiculopathy, lumbar region: Secondary | ICD-10-CM | POA: Diagnosis not present

## 2022-05-19 NOTE — Therapy (Signed)
OUTPATIENT PHYSICAL THERAPY THORACOLUMBAR EVALUATION   Patient Name: April Webster MRN: [id number] DOB:[birth year], 47 y.o., female Today's Date: 05/19/2022  END OF SESSION:  PT End of Session - 05/19/22 1558     Visit Number 1    Number of Visits 12    Date for PT Re-Evaluation 06/30/22    PT Start Time 1315    PT Stop Time 1350    PT Time Calculation (min) 35 min    Activity Tolerance Patient tolerated treatment well    Behavior During Therapy [facility] for tasks assessed/performed             Past Medical History:  Diagnosis Date   Chronic idiopathic constipation    DDD (degenerative disc disease), cervical and lumbar    Female pattern hair loss    Graves disease    Hyperlipidemia    Hypertension    Hypothyroidism    Lichen sclerosus    Raynaud's disease    Past Surgical History:  Procedure Laterality Date   ABDOMINOPLASTY     AUGMENTATION MAMMAPLASTY Bilateral [id number]   Silicone    CHOLECYSTECTOMY     DILITATION & CURRETTAGE/HYSTROSCOPY WITH NOVASURE ABLATION N/A 02/09/2022   Procedure: DILATATION & CURETTAGE/HYSTEROSCOPY WITH NOVASURE ABLATION, REMOVAL OF IUD;  Surgeon: Radene Gunning, MD;  Location: [place];  Service: Gynecology;  Laterality: N/A;   PLACEMENT OF BREAST IMPLANTS     THYROID SURGERY     Patient Active Problem List   Diagnosis Date Noted   Primary insomnia 09/09/2021   Chronic pain of right knee 08/12/2021   LGSIL on Pap smear of cervix 07/01/2020   Raynaud's disease 02/18/2020   IC (interstitial cystitis) 02/18/2020   Chronic constipation 02/18/2020   Cervical pain 12/14/2019   Gastroesophageal reflux disease 05/16/2019   Hypothyroidism 03/19/2019   Hyperlipidemia 03/19/2019   Facet syndrome, lumbar 03/19/2019   Menorrhagia 03/19/2019   Female pattern hair loss [id number]   Lichen sclerosus [id number]   Upper airway cough syndrome 03/19/2019   IUD (intrauterine device) in place 12/06/2016   Depression 05/10/2011    PCP:  Madilyn Fireman  REFERRING PROVIDER: Madilyn Fireman  REFERRING DIAG: cervical pain, lumbar facet syndrome, upper back pain  Rationale for Evaluation and Treatment: Rehabilitation  THERAPY DIAG:  Abnormal posture  Muscle weakness (generalized)  ONSET DATE: 03/2022  SUBJECTIVE:                                                                                                                                                                                           SUBJECTIVE STATEMENT: Pt has had neck pain for years in suboccipital region. Which has been getting  better with use of massage. She has new onset of mid back pain in thoracic area. She has had an "adjustment" which helped for the pain but only for a few hours. Pain increases with sudden quick movements and is always a "dull ache".   PERTINENT HISTORY:  arthritis  PAIN:  Are you having pain? Yes: NPRS scale: 3/10 Pain location: mid back Pain description: sore, achey Aggravating factors: quick movements, constant ache Relieving factors: massage  PRECAUTIONS: None  WEIGHT BEARING RESTRICTIONS: No  FALLS:  Has patient fallen in last 6 months? No    OCCUPATION: nurse practitioner  PLOF: Independent  PATIENT GOALS: decrease pain    OBJECTIVE:   DIAGNOSTIC FINDINGS:  Cervical MRI: 1. Central to right paracentral disc osteophyte complex at C4-5 with resultant mild right C5 foraminal stenosis. 2. Additional mild noncompressive disc bulging at C2-3, C3-4, and C5-6 without significant stenosis or neural impingement.   POSTURE: rounded shoulders, forward head, and increased thoracic kyphosis  PALPATION: TTP bilat cervical paraspinals Lt > Rt, increased mm spasticity bilat suboccipitals, hypomobile T4-T7 with CPAs and UPAs, TTP bilat thoracic paraspinals  CERVICAL ROM:   AROM eval  Flexion 45 pain end range  Extension 50  Right lateral flexion 45  Left lateral flexion 40  Right rotation 80  Left rotation 80   (Blank  rows = not tested)  UPPER EXTREMITY MMT:  MMT Right eval Left eval  Shoulder flexion    Shoulder extension    Shoulder abduction 4 4+  Shoulder adduction    Shoulder extension    Shoulder internal rotation    Shoulder external rotation    Middle trapezius 4 4  Lower trapezius 4- 4-  Elbow flexion    Elbow extension    Wrist flexion    Wrist extension    Wrist ulnar deviation    Wrist radial deviation    Wrist pronation    Wrist supination    Grip strength     (Blank rows = not tested)  TODAY'S TREATMENT:                                                                                                                              DATE: 05/19/22 See HEP  Attempted thoracic extension with foam roll - stretch felt Attempted supine on noodle for bear hug, serratus punch and shoulder flexion all with no pain but no stretch felt   PATIENT EDUCATION:  Education details: PT POC and goals, HEP Person educated: Patient Education method: Explanation, Demonstration, and Handouts Education comprehension: verbalized understanding and returned demonstration  HOME EXERCISE PROGRAM: Access Code: [email or web address] URL: [email or web address] Date: 05/19/2022 Prepared by: Isabelle Course  Exercises - Quadruped Thoracic Rotation - Reach Under  - 1 x daily - 7 x weekly - 3 sets - 10 reps - Seated Cervical Retraction  - 1 x daily - 7 x weekly - 2 sets - 10 reps - Prone W Scapular Retraction  - 1  x daily - 7 x weekly - 3 sets - 10 reps - Prone Shoulder Horizontal Abduction with Thumbs Up  - 1 x daily - 7 x weekly - 3 sets - 10 reps - Low Trap Setting at [place]  - 1 x daily - 7 x weekly - 3 sets - 10 reps  ASSESSMENT:  CLINICAL IMPRESSION: Patient is a 47 y.o. female who was seen today for physical therapy evaluation and treatment for cervical pain, upper back pain, lumbar facet syndrome. Pt presents with hypomobility in cervical and thoracic spine, increased mm spasticity in thoracic  and cervical paraspinals and suboccipitals, decreased mid and lower trap strength and impaired posture. Pt will benefit from skilled PT to address deficits and improve pain free mobility.   OBJECTIVE IMPAIRMENTS: decreased activity tolerance, decreased mobility, decreased ROM, decreased strength, increased muscle spasms, and pain.   ACTIVITY LIMITATIONS: sitting  PARTICIPATION LIMITATIONS: community activity and occupation  PERSONAL FACTORS: Past/current experiences and Time since onset of injury/illness/exacerbation are also affecting patient's functional outcome.   REHAB POTENTIAL: Good  CLINICAL DECISION MAKING: Evolving/moderate complexity  EVALUATION COMPLEXITY: Moderate   GOALS: Goals reviewed with patient? Yes  SHORT TERM GOALS: Target date: 06/02/2022    Pt will be independent with initial HEP Baseline: Goal status: INITIAL   LONG TERM GOALS: Target date: 06/30/2022    Pt will be independent with advanced HEP Baseline:  Goal status: INITIAL  2.  Pt will report mid back pain <= 2/10 at end of day 5/7 days Baseline:  Goal status: INITIAL  3.  Pt will improve lower and mid trap strength to 4+/5 to improve tolerance to quick position changes Baseline:  Goal status: INITIAL   PLAN:  PT FREQUENCY: 1-2x/week  PT DURATION: 6 weeks  PLANNED INTERVENTIONS: Therapeutic exercises, Therapeutic activity, Neuromuscular re-education, Balance training, Gait training, Patient/Family education, Self Care, Joint mobilization, Aquatic Therapy, Dry Needling, Electrical stimulation, Cryotherapy, Moist heat, Taping, Traction, Ultrasound, Manual therapy, and Re-evaluation.  PLAN FOR NEXT SESSION: mid back strength and flexibility   Jayle Solarz, PT 05/19/2022, 3:58 PM

## 2022-05-26 ENCOUNTER — Ambulatory Visit: Payer: 59 | Admitting: Physical Therapy

## 2022-05-26 ENCOUNTER — Encounter: Payer: Self-pay | Admitting: Physical Therapy

## 2022-05-26 DIAGNOSIS — R293 Abnormal posture: Secondary | ICD-10-CM

## 2022-05-26 DIAGNOSIS — M6281 Muscle weakness (generalized): Secondary | ICD-10-CM | POA: Diagnosis not present

## 2022-05-26 DIAGNOSIS — M549 Dorsalgia, unspecified: Secondary | ICD-10-CM | POA: Diagnosis not present

## 2022-05-26 DIAGNOSIS — M47816 Spondylosis without myelopathy or radiculopathy, lumbar region: Secondary | ICD-10-CM | POA: Diagnosis not present

## 2022-05-26 DIAGNOSIS — M542 Cervicalgia: Secondary | ICD-10-CM | POA: Diagnosis not present

## 2022-05-26 NOTE — Therapy (Signed)
OUTPATIENT PHYSICAL THERAPY TREATMENT   Patient Name: April Webster MRN: [id number] DOB:1975-11-20, 47 y.o., female Today's Date: 05/26/2022  END OF SESSION:  PT End of Session - 05/26/22 1357     Visit Number 2    Number of Visits 12    Date for PT Re-Evaluation 06/30/22    PT Start Time 1315    PT Stop Time [id number]    PT Time Calculation (min) 38 min    Activity Tolerance Patient tolerated treatment well    Behavior During Therapy WFL for tasks assessed/performed              Past Medical History:  Diagnosis Date   Chronic idiopathic constipation    DDD (degenerative disc disease), cervical and lumbar    Female pattern hair loss    Graves disease    Hyperlipidemia    Hypertension    Hypothyroidism    Lichen sclerosus    Raynaud's disease    Past Surgical History:  Procedure Laterality Date   ABDOMINOPLASTY     AUGMENTATION MAMMAPLASTY Bilateral [id number]   Silicone    CHOLECYSTECTOMY     DILITATION & CURRETTAGE/HYSTROSCOPY WITH NOVASURE ABLATION N/A 02/09/2022   Procedure: DILATATION & CURETTAGE/HYSTEROSCOPY WITH NOVASURE ABLATION, REMOVAL OF IUD;  Surgeon: Radene Gunning, MD;  Location: [place];  Service: Gynecology;  Laterality: N/A;   PLACEMENT OF BREAST IMPLANTS     THYROID SURGERY     Patient Active Problem List   Diagnosis Date Noted   Primary insomnia 09/09/2021   Chronic pain of right knee 08/12/2021   LGSIL on Pap smear of cervix 07/01/2020   Raynaud's disease 02/18/2020   IC (interstitial cystitis) 02/18/2020   Chronic constipation 02/18/2020   Cervical pain 12/14/2019   Gastroesophageal reflux disease 05/16/2019   Hypothyroidism 03/19/2019   Hyperlipidemia 03/19/2019   Facet syndrome, lumbar 03/19/2019   Menorrhagia 03/19/2019   Female pattern hair loss [id number]   Lichen sclerosus [id number]   Upper airway cough syndrome 03/19/2019   IUD (intrauterine device) in place 12/06/2016   Depression 05/10/2011    PCP: Madilyn Fireman  REFERRING PROVIDER:  Madilyn Fireman  REFERRING DIAG: cervical pain, lumbar facet syndrome, upper back pain  Rationale for Evaluation and Treatment: Rehabilitation  THERAPY DIAG:  Abnormal posture  Muscle weakness (generalized)  ONSET DATE: 03/2022  SUBJECTIVE:                                                                                                                                                                                           SUBJECTIVE STATEMENT: Pt states she went jogging yesterday so she is more sore today  PERTINENT  HISTORY:  Arthritis Pt has had neck pain for years in suboccipital region. Which has been getting better with use of massage. She has new onset of mid back pain in thoracic area. She has had an "adjustment" which helped for the pain but only for a few hours. Pain increases with sudden quick movements and is always a "dull ache".   PAIN:  Are you having pain? Yes: NPRS scale: 3/10 Pain location: mid back Pain description: sore, achey Aggravating factors: quick movements, constant ache Relieving factors: massage  PRECAUTIONS: None  WEIGHT BEARING RESTRICTIONS: No  FALLS:  Has patient fallen in last 6 months? No    OCCUPATION: nurse practitioner  PLOF: Independent  PATIENT GOALS: decrease pain    OBJECTIVE:   DIAGNOSTIC FINDINGS:  Cervical MRI: 1. Central to right paracentral disc osteophyte complex at C4-5 with resultant mild right C5 foraminal stenosis. 2. Additional mild noncompressive disc bulging at C2-3, C3-4, and C5-6 without significant stenosis or neural impingement.   POSTURE: rounded shoulders, forward head, and increased thoracic kyphosis  PALPATION: TTP bilat cervical paraspinals Lt > Rt, increased mm spasticity bilat suboccipitals, hypomobile T4-T7 with CPAs and UPAs, TTP bilat thoracic paraspinals  CERVICAL ROM:   AROM eval  Flexion 45 pain end range  Extension 50  Right lateral flexion 45  Left lateral flexion 40  Right rotation 80   Left rotation 80   (Blank rows = not tested)  UPPER EXTREMITY MMT:  MMT Right eval Left eval  Shoulder flexion    Shoulder extension    Shoulder abduction 4 4+  Shoulder adduction    Shoulder extension    Shoulder internal rotation    Shoulder external rotation    Middle trapezius 4 4  Lower trapezius 4- 4-  Elbow flexion    Elbow extension    Wrist flexion    Wrist extension    Wrist ulnar deviation    Wrist radial deviation    Wrist pronation    Wrist supination    Grip strength     (Blank rows = not tested)  TODAY'S TREATMENT:                                                                                                                              [facility] Adult PT Treatment:                                                DATE: 05/26/22 Therapeutic Exercise: Thread the needle x 10 bilat Laying on coregeous ball for thoracic stretch Low trap lift off at wall x 12 Half kneeling thoracic rotation x 10 bilat Half kneeling open book red TB x 10 bilat W's red TB x 5, then x 10 without resistance Manual Therapy: STM and TPR bilat cervical paraspinals, upper traps, suboccipitals  DATE: 05/19/22  See HEP  Attempted thoracic extension with foam roll - stretch felt Attempted supine on noodle for bear hug, serratus punch and shoulder flexion all with no pain but no stretch felt   PATIENT EDUCATION:  Education details: PT POC and goals, HEP Person educated: Patient Education method: Explanation, Demonstration, and Handouts Education comprehension: verbalized understanding and returned demonstration  HOME EXERCISE PROGRAM: Access Code: [email or web address] URL: [email or web address] Date: 05/19/2022 Prepared by: Isabelle Course  Exercises - Quadruped Thoracic Rotation - Reach Under  - 1 x daily - 7 x weekly - 3 sets - 10 reps - Seated Cervical Retraction  - 1 x daily - 7 x weekly - 2 sets - 10 reps - Prone W Scapular Retraction  - 1 x daily - 7 x weekly - 3 sets - 10  reps - Prone Shoulder Horizontal Abduction with Thumbs Up  - 1 x daily - 7 x weekly - 3 sets - 10 reps - Low Trap Setting at [place]  - 1 x daily - 7 x weekly - 3 sets - 10 reps  ASSESSMENT:  CLINICAL IMPRESSION: Pt with good response to coregeous ball mobilization and manual work. Some difficulty with W's with resistance, improved without resistance. She will continue to benefit from postural strength and thoracic and cervical mobilization   GOALS: Goals reviewed with patient? Yes  SHORT TERM GOALS: Target date: 06/02/2022    Pt will be independent with initial HEP Baseline: Goal status: INITIAL   LONG TERM GOALS: Target date: 06/30/2022    Pt will be independent with advanced HEP Baseline:  Goal status: INITIAL  2.  Pt will report mid back pain <= 2/10 at end of day 5/7 days Baseline:  Goal status: INITIAL  3.  Pt will improve lower and mid trap strength to 4+/5 to improve tolerance to quick position changes Baseline:  Goal status: INITIAL   PLAN:  PT FREQUENCY: 1-2x/week  PT DURATION: 6 weeks  PLANNED INTERVENTIONS: Therapeutic exercises, Therapeutic activity, Neuromuscular re-education, Balance training, Gait training, Patient/Family education, Self Care, Joint mobilization, Aquatic Therapy, Dry Needling, Electrical stimulation, Cryotherapy, Moist heat, Taping, Traction, Ultrasound, Manual therapy, and Re-evaluation.  PLAN FOR NEXT SESSION: mid back strength and flexibility   Mahala Rommel, PT 05/26/2022, 1:58 PM

## 2022-05-31 ENCOUNTER — Other Ambulatory Visit: Payer: Self-pay

## 2022-06-04 ENCOUNTER — Other Ambulatory Visit: Payer: Self-pay

## 2022-06-04 ENCOUNTER — Telehealth: Payer: Self-pay | Admitting: Family Medicine

## 2022-06-04 MED ORDER — METHYLPHENIDATE HCL ER (OSM) 27 MG PO TBCR
27.0000 mg | EXTENDED_RELEASE_TABLET | ORAL | 0 refills | Status: DC
  Filled 2022-06-04: qty 30, 30d supply, fill #0

## 2022-06-04 NOTE — Telephone Encounter (Signed)
Meds ordered this encounter  Medications   methylphenidate (CONCERTA) 27 MG PO CR tablet    Sig: Take 1 tablet (27 mg total) by mouth every morning.    Dispense:  30 tablet    Refill:  0    Pls deliver/mail

## 2022-06-09 ENCOUNTER — Ambulatory Visit: Payer: 59 | Admitting: Physical Therapy

## 2022-06-09 ENCOUNTER — Encounter: Payer: Self-pay | Admitting: Physical Therapy

## 2022-06-09 DIAGNOSIS — R293 Abnormal posture: Secondary | ICD-10-CM

## 2022-06-09 DIAGNOSIS — M6281 Muscle weakness (generalized): Secondary | ICD-10-CM

## 2022-06-09 DIAGNOSIS — M549 Dorsalgia, unspecified: Secondary | ICD-10-CM | POA: Diagnosis not present

## 2022-06-09 DIAGNOSIS — M47816 Spondylosis without myelopathy or radiculopathy, lumbar region: Secondary | ICD-10-CM | POA: Diagnosis not present

## 2022-06-09 DIAGNOSIS — M542 Cervicalgia: Secondary | ICD-10-CM | POA: Diagnosis not present

## 2022-06-09 NOTE — Therapy (Signed)
OUTPATIENT PHYSICAL THERAPY TREATMENT   Patient Name: April Webster MRN: [id number] DOB:1975-12-13, 47 y.o., female Today's Date: 06/09/2022  END OF SESSION:  PT End of Session - 06/09/22 1501     Visit Number 3    Number of Visits 12    Date for PT Re-Evaluation 06/30/22    PT Start Time 1400    PT Stop Time [id number]    PT Time Calculation (min) 43 min    Activity Tolerance Patient tolerated treatment well    Behavior During Therapy [facility] for tasks assessed/performed               Past Medical History:  Diagnosis Date   Chronic idiopathic constipation    DDD (degenerative disc disease), cervical and lumbar    Female pattern hair loss    Graves disease    Hyperlipidemia    Hypertension    Hypothyroidism    Lichen sclerosus    Raynaud's disease    Past Surgical History:  Procedure Laterality Date   ABDOMINOPLASTY     AUGMENTATION MAMMAPLASTY Bilateral [id number]   Silicone    CHOLECYSTECTOMY     DILITATION & CURRETTAGE/HYSTROSCOPY WITH NOVASURE ABLATION N/A 02/09/2022   Procedure: DILATATION & CURETTAGE/HYSTEROSCOPY WITH NOVASURE ABLATION, REMOVAL OF IUD;  Surgeon: Radene Gunning, MD;  Location: [place];  Service: Gynecology;  Laterality: N/A;   PLACEMENT OF BREAST IMPLANTS     THYROID SURGERY     Patient Active Problem List   Diagnosis Date Noted   Primary insomnia 09/09/2021   Chronic pain of right knee 08/12/2021   LGSIL on Pap smear of cervix 07/01/2020   Raynaud's disease 02/18/2020   IC (interstitial cystitis) 02/18/2020   Chronic constipation 02/18/2020   Cervical pain 12/14/2019   Gastroesophageal reflux disease 05/16/2019   Hypothyroidism 03/19/2019   Hyperlipidemia 03/19/2019   Facet syndrome, lumbar 03/19/2019   Menorrhagia 03/19/2019   Female pattern hair loss [id number]   Lichen sclerosus [id number]   Upper airway cough syndrome 03/19/2019   IUD (intrauterine device) in place 12/06/2016   Depression 05/10/2011    PCP: Madilyn Fireman  REFERRING  PROVIDER: Madilyn Fireman  REFERRING DIAG: cervical pain, lumbar facet syndrome, upper back pain  Rationale for Evaluation and Treatment: Rehabilitation  THERAPY DIAG:  Abnormal posture  Muscle weakness (generalized)  ONSET DATE: 03/2022  SUBJECTIVE:                                                                                                                                                                                           SUBJECTIVE STATEMENT: Pt got a coregeous ball and states it helps a lot. She still  feels still 2-3 hours after stretching  PERTINENT HISTORY:  Arthritis Pt has had neck pain for years in suboccipital region. Which has been getting better with use of massage. She has new onset of mid back pain in thoracic area. She has had an "adjustment" which helped for the pain but only for a few hours. Pain increases with sudden quick movements and is always a "dull ache".   PAIN:  Are you having pain? Yes: NPRS scale: 3/10 Pain location: mid back Pain description: sore, achey Aggravating factors: quick movements, constant ache Relieving factors: massage  PRECAUTIONS: None  WEIGHT BEARING RESTRICTIONS: No  FALLS:  Has patient fallen in last 6 months? No    OCCUPATION: nurse practitioner  PLOF: Independent  PATIENT GOALS: decrease pain    OBJECTIVE:   DIAGNOSTIC FINDINGS:  Cervical MRI: 1. Central to right paracentral disc osteophyte complex at C4-5 with resultant mild right C5 foraminal stenosis. 2. Additional mild noncompressive disc bulging at C2-3, C3-4, and C5-6 without significant stenosis or neural impingement.   POSTURE: rounded shoulders, forward head, and increased thoracic kyphosis  PALPATION: TTP bilat cervical paraspinals Lt > Rt, increased mm spasticity bilat suboccipitals, hypomobile T4-T7 with CPAs and UPAs, TTP bilat thoracic paraspinals  CERVICAL ROM:   AROM eval  Flexion 45 pain end range  Extension 50  Right lateral flexion 45   Left lateral flexion 40  Right rotation 80  Left rotation 80   (Blank rows = not tested)  UPPER EXTREMITY MMT:  MMT Right eval Left eval  Shoulder flexion    Shoulder extension    Shoulder abduction 4 4+  Shoulder adduction    Shoulder extension    Shoulder internal rotation    Shoulder external rotation    Middle trapezius 4 4  Lower trapezius 4- 4-  Elbow flexion    Elbow extension    Wrist flexion    Wrist extension    Wrist ulnar deviation    Wrist radial deviation    Wrist pronation    Wrist supination    Grip strength     (Blank rows = not tested)  TODAY'S TREATMENT:                                                                                                                               [facility] Adult PT Treatment:                                                DATE: 06/09/22 Therapeutic Exercise: Prone to sidelying open book x 5 bilat Half kneeling thoracic rotation x 10 bilat Half kneeling open book green TB x 10 bilat Laying on coregeous ball for thoracic stretch with arms in sustained "T" Manual Therapy: STM and TPR bilat cervical paraspinals, upper traps, suboccipitals   [facility] Adult PT  Treatment:                                                DATE: 05/26/22 Therapeutic Exercise: Thread the needle x 10 bilat Laying on coregeous ball for thoracic stretch Low trap lift off at wall x 12 Half kneeling thoracic rotation x 10 bilat Half kneeling open book red TB x 10 bilat W's red TB x 5, then x 10 without resistance Manual Therapy: STM and TPR bilat cervical paraspinals, upper traps, suboccipitals   PATIENT EDUCATION:  Education details: PT POC and goals, HEP Person educated: Patient Education method: Explanation, Demonstration, and Handouts Education comprehension: verbalized understanding and returned demonstration  HOME EXERCISE PROGRAM: Access Code: [email or web address] URL: [email or web address] Date: 05/19/2022 Prepared by: Isabelle Course  Exercises - Quadruped Thoracic Rotation - Reach Under  - 1 x daily - 7 x weekly - 3 sets - 10 reps - Seated Cervical Retraction  - 1 x daily - 7 x weekly - 2 sets - 10 reps - Prone W Scapular Retraction  - 1 x daily - 7 x weekly - 3 sets - 10 reps - Prone Shoulder Horizontal Abduction with Thumbs Up  - 1 x daily - 7 x weekly - 3 sets - 10 reps - Low Trap Setting at [place]  - 1 x daily - 7 x weekly - 3 sets - 10 reps  ASSESSMENT:  CLINICAL IMPRESSION: Pt with improved mobility following prone to sidelying open book flow. Added to HEP. Good response to manual work as pt reports good results after manual work last visit   GOALS: Goals reviewed with patient? Yes  SHORT TERM GOALS: Target date: 06/02/2022    Pt will be independent with initial HEP Baseline: Goal status: MET   LONG TERM GOALS: Target date: 06/30/2022    Pt will be independent with advanced HEP Baseline:  Goal status: INITIAL  2.  Pt will report mid back pain <= 2/10 at end of day 5/7 days Baseline:  Goal status: INITIAL  3.  Pt will improve lower and mid trap strength to 4+/5 to improve tolerance to quick position changes Baseline:  Goal status: INITIAL   PLAN:  PT FREQUENCY: 1-2x/week  PT DURATION: 6 weeks  PLANNED INTERVENTIONS: Therapeutic exercises, Therapeutic activity, Neuromuscular re-education, Balance training, Gait training, Patient/Family education, Self Care, Joint mobilization, Aquatic Therapy, Dry Needling, Electrical stimulation, Cryotherapy, Moist heat, Taping, Traction, Ultrasound, Manual therapy, and Re-evaluation.  PLAN FOR NEXT SESSION: mid back strength and flexibility   Trana Ressler, PT 06/09/2022, 3:08 PM

## 2022-06-18 ENCOUNTER — Encounter: Payer: Self-pay | Admitting: Gastroenterology

## 2022-06-18 ENCOUNTER — Ambulatory Visit (AMBULATORY_SURGERY_CENTER): Payer: 59 | Admitting: Gastroenterology

## 2022-06-18 VITALS — BP 114/82 | HR 62 | Temp 98.6°F | Resp 12 | Ht 63.0 in | Wt 129.0 lb

## 2022-06-18 DIAGNOSIS — K635 Polyp of colon: Secondary | ICD-10-CM | POA: Diagnosis not present

## 2022-06-18 DIAGNOSIS — K641 Second degree hemorrhoids: Secondary | ICD-10-CM

## 2022-06-18 DIAGNOSIS — D125 Benign neoplasm of sigmoid colon: Secondary | ICD-10-CM | POA: Diagnosis not present

## 2022-06-18 DIAGNOSIS — D124 Benign neoplasm of descending colon: Secondary | ICD-10-CM | POA: Diagnosis not present

## 2022-06-18 DIAGNOSIS — K5909 Other constipation: Secondary | ICD-10-CM

## 2022-06-18 DIAGNOSIS — Z1211 Encounter for screening for malignant neoplasm of colon: Secondary | ICD-10-CM

## 2022-06-18 DIAGNOSIS — K317 Polyp of stomach and duodenum: Secondary | ICD-10-CM

## 2022-06-18 DIAGNOSIS — R1013 Epigastric pain: Secondary | ICD-10-CM

## 2022-06-18 DIAGNOSIS — K2970 Gastritis, unspecified, without bleeding: Secondary | ICD-10-CM

## 2022-06-18 DIAGNOSIS — I10 Essential (primary) hypertension: Secondary | ICD-10-CM | POA: Diagnosis not present

## 2022-06-18 DIAGNOSIS — R110 Nausea: Secondary | ICD-10-CM

## 2022-06-18 DIAGNOSIS — E039 Hypothyroidism, unspecified: Secondary | ICD-10-CM | POA: Diagnosis not present

## 2022-06-18 DIAGNOSIS — K5140 Inflammatory polyps of colon without complications: Secondary | ICD-10-CM | POA: Diagnosis not present

## 2022-06-18 MED ORDER — SODIUM CHLORIDE 0.9 % IV SOLN: 500.0000 mL | Freq: Once | INTRAVENOUS | Status: DC

## 2022-06-18 NOTE — Patient Instructions (Addendum)
Handout on gastritis given to you today   Handouts on polyps & hemorrhoids given to you today.   Await pathology results on polyps removed from colon and biopsies from stomach    Continue present medications     YOU HAD AN ENDOSCOPIC PROCEDURE TODAY AT THE [facility]:   Refer to the procedure report that was given to you for any specific questions about what was found during the examination.  If the procedure report does not answer your questions, please call your gastroenterologist to clarify.  If you requested that your care partner not be given the details of your procedure findings, then the procedure report has been included in a sealed envelope for you to review at your convenience later.  YOU SHOULD EXPECT: Some feelings of bloating in the abdomen. Passage of more gas than usual.  Walking can help get rid of the air that was put into your GI tract during the procedure and reduce the bloating. If you had a lower endoscopy (such as a colonoscopy or flexible sigmoidoscopy) you may notice spotting of blood in your stool or on the toilet paper. If you underwent a bowel prep for your procedure, you may not have a normal bowel movement for a few days.  Please Note:  You might notice some irritation and congestion in your nose or some drainage.  This is from the oxygen used during your procedure.  There is no need for concern and it should clear up in a day or so.  SYMPTOMS TO REPORT IMMEDIATELY:  Following lower endoscopy (colonoscopy or flexible sigmoidoscopy):  Excessive amounts of blood in the stool  Significant tenderness or worsening of abdominal pains  Swelling of the abdomen that is new, acute  Fever of 100F or higher  Following upper endoscopy (EGD)  Vomiting of blood or coffee ground material  New chest pain or pain under the shoulder blades  Painful or persistently difficult swallowing  New shortness of breath  Fever of 100F or higher  Black,  tarry-looking stools  For urgent or emergent issues, a gastroenterologist can be reached at any hour by calling [phone]. Do not use MyChart messaging for urgent concerns.    DIET:  We do recommend a small meal at first, but then you may proceed to your regular diet.  Drink plenty of fluids but you should avoid alcoholic beverages for 24 hours.  ACTIVITY:  You should plan to take it easy for the rest of today and you should NOT DRIVE or use heavy machinery until tomorrow (because of the sedation medicines used during the test).    FOLLOW UP: Our staff will call the number listed on your records the next business day following your procedure.  We will call around 7:15- 8:00 am to check on you and address any questions or concerns that you may have regarding the information given to you following your procedure. If we do not reach you, we will leave a message.     If any biopsies were taken you will be contacted by phone or by letter within the next 1-3 weeks.  Please call us at [phone] if you have not heard about the biopsies in 3 weeks.    SIGNATURES/CONFIDENTIALITY: You and/or your care partner have signed paperwork which will be entered into your electronic medical record.  These signatures attest to the fact that that the information above on your After Visit Summary has been reviewed and is understood.  Full responsibility of  the confidentiality of this discharge information lies with you and/or your care-partner.

## 2022-06-18 NOTE — Progress Notes (Signed)
Pt's states no medical or surgical changes since previsit or office visit. 

## 2022-06-18 NOTE — Progress Notes (Signed)
1400 Robinul 0.1 mg IV given due large amount of secretions upon assessment.  MD made aware, vss 

## 2022-06-18 NOTE — Progress Notes (Signed)
Report given to PACU, vss 

## 2022-06-18 NOTE — Progress Notes (Signed)
GASTROENTEROLOGY PROCEDURE H&P NOTE   Primary Care Physician: Agapito GamesMetheney, Catherine D, MD    Reason for Procedure:   Constipation, nausea, epigastric pain, CRC screening  Plan:    EGD/Colonoscopy  Patient is appropriate for endoscopic procedure(s) in the ambulatory ([facility]) setting.  The nature of the procedure, as well as the risks, benefits, and alternatives were carefully and thoroughly reviewed with the patient. Ample time for discussion and questions allowed. The patient understood, was satisfied, and agreed to proceed.     HPI: April Webster is a 47 y.o. female who presents for EGD and colonoscopy for evaluation of nausea/vomiting, MED pain, chronic constipation, and routine CRC screening.  Past Medical History:  Diagnosis Date   Chronic idiopathic constipation    DDD (degenerative disc disease), cervical and lumbar    Female pattern hair loss    Graves disease    Hyperlipidemia    Hypertension    Hypothyroidism    Lichen sclerosus    Raynaud's disease     Past Surgical History:  Procedure Laterality Date   ABDOMINOPLASTY     AUGMENTATION MAMMAPLASTY Bilateral 05/2019   Silicone    CHOLECYSTECTOMY     DILITATION & CURRETTAGE/HYSTROSCOPY WITH NOVASURE ABLATION N/A 02/09/2022   Procedure: DILATATION & CURETTAGE/HYSTEROSCOPY WITH NOVASURE ABLATION, REMOVAL OF IUD;  Surgeon: Milas Hockuncan, Paula, MD;  [facility];  Service: Gynecology;  Laterality: N/A;   PLACEMENT OF BREAST IMPLANTS     THYROID SURGERY      Prior to Admission medications   Medication Sig Start Date End Date Taking? Authorizing Provider  albuterol (VENTOLIN HFA) 108 (90 Base) MCG/ACT inhaler Inhale 2 puffs into the lungs every 6 (six) hours as needed for wheezing or shortness of breath. 06/10/21   Agapito GamesMetheney, Catherine D, MD  atorvastatin (LIPITOR) 20 MG tablet Take 1 tablet (20 mg total) by mouth daily. 05/10/22 05/10/23  Agapito GamesMetheney, Catherine D, MD  baclofen (LIORESAL) 10 MG tablet Take 1 tablet (10 mg  total) by mouth at bedtime. 05/10/22   Agapito GamesMetheney, Catherine D, MD  clobetasol ointment (TEMOVATE) 0.05 % Apply 1 application topically daily as needed for rash. LIchen sclerosis    [provider]  finasteride (PROSCAR) 5 MG tablet Take 0.5 tablets (2.5 mg total) by mouth daily. 05/10/22   Agapito GamesMetheney, Catherine D, MD  lubiprostone (AMITIZA) 24 MCG capsule Take 1 capsule (24 mcg total) by mouth 2 (two) times daily with a meal. Patient taking differently: Take 24 mcg by mouth daily with breakfast. 10/07/21   Agapito GamesMetheney, Catherine D, MD  melatonin 5 MG TABS Take 5 mg by mouth at bedtime as needed (sleep).    [provider]  meloxicam (MOBIC) 15 MG tablet Take 1 tablet (15 mg total) by mouth daily. Patient taking differently: Take 15 mg by mouth at bedtime. 01/06/22   Agapito GamesMetheney, Catherine D, MD  methylphenidate (CONCERTA) 27 MG PO CR tablet Take 1 tablet (27 mg total) by mouth every morning. 06/04/22   Agapito GamesMetheney, Catherine D, MD  Multiple Vitamins-Minerals (MULTIVITAMIN WITH MINERALS) tablet Take 1 tablet by mouth daily.    [provider]  ondansetron (ZOFRAN) 4 MG tablet Take 1 tablet (4 mg total) by mouth every 8 (eight) hours as needed for nausea or vomiting. 04/05/22   Agapito GamesMetheney, Catherine D, MD  pantoprazole (PROTONIX) 20 MG tablet Take 1 tablet (20 mg total) by mouth daily. 02/26/22   Agapito GamesMetheney, Catherine D, MD  sennosides-docusate sodium (SENOKOT-S) 8.6-50 MG tablet Take 1 tablet by mouth daily as needed  for constipation.    [provider]  SYNTHROID 112 MCG tablet Take 1 tablet (112 mcg total) by mouth daily before breakfast. 04/28/22   Agapito GamesMetheney, Catherine D, MD  traZODone (DESYREL) 50 MG tablet Take 0.5-2 tablets (25-100 mg total) by mouth at bedtime. 02/15/22   Agapito GamesMetheney, Catherine D, MD  vortioxetine HBr (TRINTELLIX) 10 MG TABS tablet Take 1 tablet (10 mg total) by mouth daily. 01/05/22   Monica Bectonhekkekandam, Thomas J, MD    Current Outpatient Medications  Medication Sig  Dispense Refill   albuterol (VENTOLIN HFA) 108 (90 Base) MCG/ACT inhaler Inhale 2 puffs into the lungs every 6 (six) hours as needed for wheezing or shortness of breath. 18 g 2   atorvastatin (LIPITOR) 20 MG tablet Take 1 tablet (20 mg total) by mouth daily. 90 tablet 3   baclofen (LIORESAL) 10 MG tablet Take 1 tablet (10 mg total) by mouth at bedtime. 90 tablet 1   clobetasol ointment (TEMOVATE) 0.05 % Apply 1 application topically daily as needed for rash. LIchen sclerosis     finasteride (PROSCAR) 5 MG tablet Take 0.5 tablets (2.5 mg total) by mouth daily. 45 tablet 3   lubiprostone (AMITIZA) 24 MCG capsule Take 1 capsule (24 mcg total) by mouth 2 (two) times daily with a meal. (Patient taking differently: Take 24 mcg by mouth daily with breakfast.) 180 capsule 1   melatonin 5 MG TABS Take 5 mg by mouth at bedtime as needed (sleep).     meloxicam (MOBIC) 15 MG tablet Take 1 tablet (15 mg total) by mouth daily. (Patient taking differently: Take 15 mg by mouth at bedtime.) 90 tablet 1   methylphenidate (CONCERTA) 27 MG PO CR tablet Take 1 tablet (27 mg total) by mouth every morning. 30 tablet 0   Multiple Vitamins-Minerals (MULTIVITAMIN WITH MINERALS) tablet Take 1 tablet by mouth daily.     ondansetron (ZOFRAN) 4 MG tablet Take 1 tablet (4 mg total) by mouth every 8 (eight) hours as needed for nausea or vomiting. 21 tablet 0   pantoprazole (PROTONIX) 20 MG tablet Take 1 tablet (20 mg total) by mouth daily. 90 tablet 3   sennosides-docusate sodium (SENOKOT-S) 8.6-50 MG tablet Take 1 tablet by mouth daily as needed for constipation.     SYNTHROID 112 MCG tablet Take 1 tablet (112 mcg total) by mouth daily before breakfast. 90 tablet 4   traZODone (DESYREL) 50 MG tablet Take 0.5-2 tablets (25-100 mg total) by mouth at bedtime. 60 tablet 3   vortioxetine HBr (TRINTELLIX) 10 MG TABS tablet Take 1 tablet (10 mg total) by mouth daily. 30 tablet 11   Current Facility-Administered Medications  Medication  Dose Route Frequency Provider Last Rate Last Admin   0.9 %  sodium chloride infusion  500 mL Intravenous Once Vear Staton V, DO        Allergies as of 06/18/2022 - Review Complete 06/17/2022  Allergen Reaction Noted   Cefuroxime axetil Rash 09/15/2010   Tuberculin purified protein derivative Rash 08/18/2015   Tuberculin, ppd Rash 08/18/2015   Ambien [zolpidem] Other (See Comments) 09/20/2019   Cymbalta [duloxetine hcl] Other (See Comments) 08/14/2020   Fluoxetine Other (See Comments) 02/27/2021   Tape Dermatitis and Rash 05/20/2021    Family History  Problem Relation Age of Onset   Hypertension Mother    Hyperlipidemia Mother    Osteoporosis Mother    Lung cancer Father    Heart attack Maternal Grandmother    Stomach cancer Maternal Grandfather    Colon  cancer Neg Hx    Rectal cancer Neg Hx    Liver cancer Neg Hx    Esophageal cancer Neg Hx    Pancreatic cancer Neg Hx     Social History   Socioeconomic History   Marital status: Married    Spouse name: Not on file   Number of children: 3   Years of education: Not on file   Highest education level: Not on file  Occupational History   Occupation: NP at [employer]  Tobacco Use   Smoking status: Former    Packs/day: 2.00    Years: 5.00    Additional pack years: 0.00    Total pack years: 10.00    Types: Cigarettes    Quit date: 03/15/1996    Years since quitting: 26.2   Smokeless tobacco: Never  Vaping Use   Vaping Use: Never used  Substance and Sexual Activity   Alcohol use: Yes    Alcohol/week: 2.0 standard drinks of alcohol    Types: 2 Glasses of wine per week    Comment: occ   Drug use: Never   Sexual activity: Yes    Partners: Male    Birth control/protection: I.U.D.  Other Topics Concern   Not on file  Social History Narrative   Not on file   Social Determinants of Health   Financial Resource Strain: Not on file  Food Insecurity: Not on file  Transportation Needs: Not on file  Physical  Activity: Not on file  Stress: Not on file  Social Connections: Not on file  Intimate Partner Violence: Not on file    Physical Exam: Vital signs in last 24 hours: @BP  110/69   Pulse 65   Temp 98.6 F (37 C) (Skin)   Ht 5\' 3"  (1.6 m)   Wt 129 lb (58.5 kg)   SpO2 100%   BMI 22.85 kg/m  GEN: NAD EYE: Sclerae anicteric ENT: MMM CV: Non-tachycardic Pulm: CTA b/l GI: Soft, NT/ND NEURO:  Alert & Oriented x 3   Doristine Locks, DO [facility]   06/18/2022 1:46 PM

## 2022-06-18 NOTE — Progress Notes (Signed)
Called to room to assist during endoscopic procedure.  Patient ID and intended procedure confirmed with present staff. Received instructions for my participation in the procedure from the performing physician.  

## 2022-06-18 NOTE — Op Note (Signed)
[facility] Patient Name: April Webster Procedure Date: 06/18/2022 2:02 PM MRN: [id number] Endoscopist: Doristine Locks , MD, [id number] Age: 47 Referring MD:  Date of Birth: 1975-09-10 Gender: Female Account #: [id number] Procedure:                Colonoscopy Indications:              Screening for colorectal malignant neoplasm, This                            is the patient's first colonoscopy.                           Separately, long-standing history of chronic                            constipation. Medicines:                Monitored Anesthesia Care Procedure:                Pre-Anesthesia Assessment:                           - Prior to the procedure, a History and Physical                            was performed, and patient medications and                            allergies were reviewed. The patient's tolerance of                            previous anesthesia was also reviewed. The risks                            and benefits of the procedure and the sedation                            options and risks were discussed with the patient.                            All questions were answered, and informed consent                            was obtained. Prior Anticoagulants: The patient has                            taken no anticoagulant or antiplatelet agents. ASA                            Grade Assessment: II - A patient with mild systemic                            disease. After reviewing the risks and benefits,  the patient was deemed in satisfactory condition to                            undergo the procedure.                           After obtaining informed consent, the colonoscope                            was passed under direct vision. Throughout the                            procedure, the patient's blood pressure, pulse, and                            oxygen saturations were monitored continuously. The                             Olympus CF-[id number]) Colonoscope was                            introduced through the anus and advanced to the the                            terminal ileum. The colonoscopy was performed                            without difficulty. The patient tolerated the                            procedure well. The quality of the bowel                            preparation was good. The terminal ileum, ileocecal                            valve, appendiceal orifice, and rectum were                            photographed. Scope In: 2:14:31 PM Scope Out: 2:36:35 PM Scope Withdrawal Time: 0 hours 14 minutes 19 seconds  Total Procedure Duration: 0 hours 22 minutes 4 seconds  Findings:                 Hemorrhoids and skin tags were found on perianal                            exam.                           A 7 mm polyp was found in the descending colon. The                            polyp was sessile. The polyp was removed with a  cold snare. Resection and retrieval were complete.                            Estimated blood loss was minimal.                           A 3 mm polyp was found in the distal sigmoid colon.                            The polyp was sessile. The polyp was removed with a                            cold snare. Resection and retrieval were complete.                            Estimated blood loss was minimal.                           A diffuse area of melanosis was found in the entire                            colon.                           The sigmoid colon was moderately tortuous.                            Advancing the scope required using manual pressure.                           Retroflexion in the rectum was not performed due to                            anatomy (narrowed rectal vault). Anterograde views                            were only notable for hemorrhoids, but otherwise                             unremarkable.                           The terminal ileum appeared normal. Complications:            No immediate complications. Estimated Blood Loss:     Estimated blood loss was minimal. Impression:               - Hemorrhoids found on perianal exam.                           - One 7 mm polyp in the descending colon, removed                            with a cold snare. Resected and retrieved.                           -  One 3 mm polyp in the distal sigmoid colon,                            removed with a cold snare. Resected and retrieved.                           - Benign melanosis coli.                           - Tortuous sigmoid colon.                           - The examined portion of the ileum was normal.                           - The GI Genius (intelligent endoscopy module),                            computer-aided polyp detection system powered by AI                            was utilized to detect colorectal polyps through                            enhanced visualization during colonoscopy. Recommendation:           - Patient has a contact number available for                            emergencies. The signs and symptoms of potential                            delayed complications were discussed with the                            patient. Return to normal activities tomorrow.                            Written discharge instructions were provided to the                            patient.                           - Resume previous diet.                           - Continue present medications.                           - Await pathology results.                           - Repeat colonoscopy for surveillance based on  pathology results.                           - Return to GI office PRN. Doristine Locks, MD 06/18/2022 2:46:41 PM

## 2022-06-18 NOTE — Op Note (Signed)
[facility] Patient Name: April Webster Procedure Date: 06/18/2022 2:02 PM MRN: [id number] Endoscopist: Doristine LocksVito Raford Brissett , MD, [phone] Age: [age] Referring MD:  Date of Birth: 08/26/1975 Gender: Female Account #: [id number] Procedure:                Upper GI endoscopy Indications:              Epigastric abdominal pain, Nausea with vomiting Medicines:                Monitored Anesthesia Care Procedure:                Pre-Anesthesia Assessment:                           - Prior to the procedure, a History and Physical                            was performed, and patient medications and                            allergies were reviewed. The patient's tolerance of                            previous anesthesia was also reviewed. The risks                            and benefits of the procedure and the sedation                            options and risks were discussed with the patient.                            All questions were answered, and informed consent                            was obtained. Prior Anticoagulants: The patient has                            taken no anticoagulant or antiplatelet agents. ASA                            Grade Assessment: II - A patient with mild systemic                            disease. After reviewing the risks and benefits,                            the patient was deemed in satisfactory condition to                            undergo the procedure.                           After obtaining informed consent, the endoscope was  passed under direct vision. Throughout the                            procedure, the patient's blood pressure, pulse, and                            oxygen saturations were monitored continuously. The                            Olympus scope [id number] was introduced through the                            mouth, and advanced to the third part of duodenum.                            The  upper GI endoscopy was accomplished without                            difficulty. The patient tolerated the procedure                            well. Scope In: Scope Out: Findings:                 The examined esophagus was normal.                           The Z-line was regular and was found 36 cm from the                            incisors.                           Multiple small sessile polyps with no bleeding were                            found in the gastric fundus and in the gastric                            body. Several of these polyps were removed with a                            cold biopsy forceps. Resection and retrieval were                            complete. Estimated blood loss was minimal.                           Mild inflammation characterized by erythema was                            found in the gastric fundus, in the gastric body                            and in  the gastric antrum. Biopsies were taken with                            a cold forceps for Helicobacter pylori testing.                            Estimated blood loss was minimal.                           The examined duodenum was normal. Biopsies were                            taken with a cold forceps for histology. Estimated                            blood loss was minimal. Complications:            No immediate complications. Estimated Blood Loss:     Estimated blood loss was minimal. Impression:               - Normal esophagus.                           - Z-line regular, 36 cm from the incisors.                           - Multiple gastric polyps. Resected and retrieved.                           - Mild, non-ulcer gastritis. Biopsied.                           - Normal examined duodenum. Biopsied. Recommendation:           - Patient has a contact number available for                            emergencies. The signs and symptoms of potential                            delayed  complications were discussed with the                            patient. Return to normal activities tomorrow.                            Written discharge instructions were provided to the                            patient.                           - Resume previous diet.                           - Continue present medications.                           -  Await pathology results.                           - Perform a colonoscopy today. Doristine Locks, MD 06/18/2022 2:40:44 PM

## 2022-06-21 ENCOUNTER — Telehealth: Payer: Self-pay

## 2022-06-21 NOTE — Telephone Encounter (Signed)
  Follow up Call-     06/18/2022    1:42 PM  Call back number  Post procedure Call Back phone  # [phone]  Permission to leave phone message Yes    Left message

## 2022-06-23 ENCOUNTER — Other Ambulatory Visit: Payer: Self-pay

## 2022-06-23 ENCOUNTER — Encounter: Payer: Self-pay | Admitting: Obstetrics and Gynecology

## 2022-06-23 ENCOUNTER — Encounter: Payer: Self-pay | Admitting: Pharmacist

## 2022-06-23 ENCOUNTER — Other Ambulatory Visit (HOSPITAL_COMMUNITY): Payer: Self-pay

## 2022-06-23 ENCOUNTER — Other Ambulatory Visit: Payer: Self-pay | Admitting: Sports Medicine

## 2022-06-23 ENCOUNTER — Ambulatory Visit (INDEPENDENT_AMBULATORY_CARE_PROVIDER_SITE_OTHER): Payer: 59 | Admitting: Obstetrics and Gynecology

## 2022-06-23 VITALS — BP 118/69 | HR 69 | Ht 63.0 in | Wt 126.0 lb

## 2022-06-23 DIAGNOSIS — Z01419 Encounter for gynecological examination (general) (routine) without abnormal findings: Secondary | ICD-10-CM | POA: Diagnosis not present

## 2022-06-23 LAB — HIV 1/2 AB - SCREENING TEST - 4TH GEN: HIV: NONREACTIVE

## 2022-06-23 MED ORDER — TIZANIDINE HCL 4 MG PO TABS
4.0000 mg | ORAL_TABLET | Freq: Every day | ORAL | 2 refills | Status: DC
  Filled 2022-06-23: qty 30, 30d supply, fill #0
  Filled 2022-07-18: qty 30, 30d supply, fill #1
  Filled 2022-08-17: qty 30, 30d supply, fill #2

## 2022-06-23 MED ORDER — PREDNISONE 50 MG PO TABS
50.0000 mg | ORAL_TABLET | Freq: Every day | ORAL | 0 refills | Status: AC
  Filled 2022-06-23: qty 5, 5d supply, fill #0

## 2022-06-23 NOTE — Progress Notes (Signed)
ANNUAL EXAM Patient name: April Webster MRN [id number]  Date of birth: 26-Mar-1975 Chief Complaint:   Annual Exam  History of Present Illness:   April Webster is a 47 y.o. [phone] who is s/p Novasure endometrial ablation being seen today for a routine annual exam.  Current complaints: None  Starting to have hot flashes Amenorrheic with ablation S/p BTL  Last pap  06/04/2021  Cytology - PAP( [facility])  *Pap Smear: NILM *HPV: HRHPV - HIGH RISK HPV ([place]): Negative ADEQUACY: Satisfactory for evaluation; transformation zone component PRESENT. DIAGNOSIS: - Negative for intraepithelial lesion or malignancy (NILM) COMMENT (MOLECULAR): Normal Reference Range HPV - Negative HPV testing in 3 years, Pap in 3 years Responsible Provider: Lennart Pall, MD Due Date: 06/04/2024  06/26/2020  Surgical pathology( [place]/ [facility])  *Colposcopy: CIN1 Important  SURGICAL PATHOLOGY: SURGICAL PATHOLOGY CASE: [id number] PATIENT: April Webster Surgical Pathology Report    05/22/2020  Cytology - PAP  HIGH RISK HPV (): Negative ADEQUACY: Satisfactory for evaluation; transformation zone component ABSENT. DIAGNOSIS: - Low grade squamous intraepithelial lesion (LSIL) Important  COMMENT (MOLECULAR): Normal Reference Range HPV - Negative    Last mammogram: 09/09/21 BIRADS 1. No FHX Last colonoscopy: 06/18/22. No FHX     01/21/2022    2:00 PM 08/14/2020   11:59 AM 08/20/2019    7:25 AM 03/19/2019    8:18 AM  Depression screen PHQ 2/9  Decreased Interest 3 0 0 0  Down, Depressed, Hopeless 3 0 0 1  PHQ - 2 Score 6 0 0 1  Altered sleeping 1 3    Tired, decreased energy 3 2    Change in appetite 2 0    Feeling bad or failure about yourself  1 0    Trouble concentrating 3 1    Moving slowly or fidgety/restless 1 0    Suicidal thoughts 0 0    PHQ-9 Score 17 6    Difficult doing work/chores Very difficult           01/21/2022    2:00 PM 08/14/2020   11:59 AM 03/19/2019     8:18 AM  GAD 7 : Generalized Anxiety Score  Nervous, Anxious, on Edge 2 0 1  Control/stop worrying 1 0 0  Worry too much - different things 2 0 0  Trouble relaxing 3 0 0  Restless 0 0 0  Easily annoyed or irritable 3 2 0  Afraid - awful might happen 0 0 0  Total GAD 7 Score 11 2 1   Anxiety Difficulty Somewhat difficult Not difficult at all Not difficult at all     Review of Systems:   Pertinent items are noted in HPI Denies any headaches, blurred vision, fatigue, shortness of breath, chest pain, abdominal pain, abnormal vaginal discharge/itching/odor/irritation, problems with periods, bowel movements, urination, or intercourse unless otherwise stated above. Pertinent History Reviewed:  Reviewed past medical,surgical, social and family history.  Reviewed problem list, medications and allergies. Physical Assessment:   Vitals:   06/23/22 1302  BP: 118/69  Pulse: 69  Weight: 126 lb (57.2 kg)  Height: 5\' 3"  (1.6 m)  Body mass index is 22.32 kg/m.        Physical Examination:   General appearance - well appearing, and in no distress  Mental status - alert, oriented to person, place, and time  Chest - respiratory effort normal  Heart - normal peripheral perfusion  Breasts - breasts appear normal, no suspicious masses, no skin or nipple changes  or axillary nodes  Abdomen - soft, nontender, nondistended, no masses or organomegaly  Pelvic - Offered. Deferred given no pelvic complaints today  Chaperone present for exam  No results found for this or any previous visit (from the past 24 hour(s)).  Assessment & Plan:  1) Well-Woman Exam Mammogram: Ordered Colonoscopy: Completed 06/18/22 Pap: Next due 2026 GC/CT: n/a HIV/HCV: Up to date  Labs/procedures today:   Orders Placed This Encounter  Procedures   MM Digital Screening   HIV 1/2 AB - SCREENING TEST - 4TH GEN   Meds: No orders of the defined types were placed in this encounter.  Follow-up: Return in about 1 year  (around 06/23/2023) for annual exam - next pap due 05/2024.  Lennart Pall, MD 06/23/2022 1:30 PM

## 2022-06-24 ENCOUNTER — Other Ambulatory Visit: Payer: Self-pay | Admitting: Family Medicine

## 2022-06-25 ENCOUNTER — Other Ambulatory Visit: Payer: Self-pay

## 2022-06-25 ENCOUNTER — Encounter: Payer: Self-pay | Admitting: Gastroenterology

## 2022-06-25 MED ORDER — ONDANSETRON HCL 4 MG PO TABS
4.0000 mg | ORAL_TABLET | Freq: Three times a day (TID) | ORAL | 0 refills | Status: DC | PRN
  Filled 2022-06-25: qty 21, 7d supply, fill #0

## 2022-06-28 ENCOUNTER — Other Ambulatory Visit: Payer: Self-pay

## 2022-06-28 ENCOUNTER — Other Ambulatory Visit (HOSPITAL_BASED_OUTPATIENT_CLINIC_OR_DEPARTMENT_OTHER): Payer: Self-pay

## 2022-06-28 MED ORDER — TRULANCE 3 MG PO TABS
3.0000 mg | ORAL_TABLET | Freq: Every day | ORAL | 3 refills | Status: DC
  Filled 2022-06-28 – 2022-07-12 (×2): qty 30, 30d supply, fill #0
  Filled 2022-08-04: qty 30, 30d supply, fill #1
  Filled 2022-09-05: qty 30, 30d supply, fill #2
  Filled 2022-10-10: qty 30, 30d supply, fill #3

## 2022-06-30 ENCOUNTER — Ambulatory Visit: Payer: 59 | Admitting: Physical Therapy

## 2022-07-05 ENCOUNTER — Other Ambulatory Visit (HOSPITAL_BASED_OUTPATIENT_CLINIC_OR_DEPARTMENT_OTHER): Payer: Self-pay

## 2022-07-10 DIAGNOSIS — S0003XA Contusion of scalp, initial encounter: Secondary | ICD-10-CM | POA: Diagnosis not present

## 2022-07-10 DIAGNOSIS — M542 Cervicalgia: Secondary | ICD-10-CM | POA: Diagnosis not present

## 2022-07-10 DIAGNOSIS — S060X0A Concussion without loss of consciousness, initial encounter: Secondary | ICD-10-CM | POA: Diagnosis not present

## 2022-07-12 ENCOUNTER — Other Ambulatory Visit (HOSPITAL_BASED_OUTPATIENT_CLINIC_OR_DEPARTMENT_OTHER): Payer: Self-pay

## 2022-07-12 ENCOUNTER — Telehealth: Payer: Self-pay | Admitting: General Practice

## 2022-07-12 NOTE — Transitions of Care (Post Inpatient/ED Visit) (Signed)
   07/12/2022  Name: April Webster MRN: [id number] DOB: 06/13/1975  Today's TOC FU Call Status: Today's TOC FU Call Status:: Successful TOC FU Call Competed TOC FU Call Complete Date: 07/12/22  Transition Care Management Follow-up Telephone Call Date of Discharge: 07/10/22 Discharge Facility: Other (Non-[facility] Facility) Name of Other ([facility]) Discharge Facility: [facility] Type of Discharge: Emergency Department Reason for ED Visit: Neurologic Neurologic Diagnosis:  (concussion)  Patient has already talked to PCP.     SIGNATURE: Modesto Charon, RN BSN

## 2022-07-13 ENCOUNTER — Telehealth: Payer: Self-pay | Admitting: Family Medicine

## 2022-07-13 DIAGNOSIS — R55 Syncope and collapse: Secondary | ICD-10-CM

## 2022-07-13 DIAGNOSIS — R001 Bradycardia, unspecified: Secondary | ICD-10-CM

## 2022-07-13 NOTE — Telephone Encounter (Signed)
See ED note.  Will go ahead and order heart monitor.  Orders Placed This Encounter  Procedures   LONG TERM MONITOR (3-14 DAYS)    Standing Status:   Future    Standing Expiration Date:   07/13/2023    Scheduling Instructions:     Having episodes of bradycardia overnight as recorded per smart watch and earlier this week had an episode where she had a syncopal event which correlated with the bradycardia which resulted in ED visit.    Order Specific Question:   Where should this test be performed?    Answer:   [facility]    Order Specific Question:   Does the patient have an implanted cardiac device?    Answer:   No    Order Specific Question:   Prescribed days of wear    Answer:   7    Order Specific Question:   Type of enrollment    Answer:   Home Enrollment    Order Specific Question:   Vendor:    Answer:   [employer]    Order Specific Question:   Notes for vendor    Answer:   Or whatever insurance will cover.  [facility] Employee

## 2022-07-19 ENCOUNTER — Other Ambulatory Visit: Payer: Self-pay

## 2022-07-20 ENCOUNTER — Ambulatory Visit (INDEPENDENT_AMBULATORY_CARE_PROVIDER_SITE_OTHER): Payer: 59 | Admitting: Family Medicine

## 2022-07-20 ENCOUNTER — Other Ambulatory Visit (HOSPITAL_COMMUNITY): Payer: Self-pay

## 2022-07-20 VITALS — BP 146/75 | HR 55 | Wt 123.0 lb

## 2022-07-20 DIAGNOSIS — F902 Attention-deficit hyperactivity disorder, combined type: Secondary | ICD-10-CM | POA: Insufficient documentation

## 2022-07-20 DIAGNOSIS — I7300 Raynaud's syndrome without gangrene: Secondary | ICD-10-CM | POA: Diagnosis not present

## 2022-07-20 DIAGNOSIS — F330 Major depressive disorder, recurrent, mild: Secondary | ICD-10-CM

## 2022-07-20 MED ORDER — AMLODIPINE BESYLATE 2.5 MG PO TABS
2.5000 mg | ORAL_TABLET | Freq: Every day | ORAL | 1 refills | Status: DC
Start: 2022-07-20 — End: 2023-10-17
  Filled 2022-07-20: qty 90, 90d supply, fill #0

## 2022-07-20 MED ORDER — ESCITALOPRAM OXALATE 10 MG PO TABS
10.0000 mg | ORAL_TABLET | Freq: Every day | ORAL | 0 refills | Status: DC
  Filled 2022-07-20: qty 90, 90d supply, fill #0

## 2022-07-20 MED ORDER — METHYLPHENIDATE HCL ER (OSM) 36 MG PO TBCR
36.0000 mg | EXTENDED_RELEASE_TABLET | ORAL | 0 refills | Status: AC
Start: 2022-07-20 — End: ?
  Filled 2022-07-20: qty 90, 90d supply, fill #0

## 2022-07-20 MED ORDER — METHYLPHENIDATE HCL ER (OSM) 27 MG PO TBCR
27.0000 mg | EXTENDED_RELEASE_TABLET | ORAL | 0 refills | Status: DC
  Filled 2022-07-20: qty 90, 90d supply, fill #0

## 2022-07-20 NOTE — Assessment & Plan Note (Signed)
Will bump Concerta to 36mg  and try see if more helpful for focus.

## 2022-07-20 NOTE — Assessment & Plan Note (Signed)
Will switch to lexapro bc of dec appetite with trintellix. 2 family members do well with Lexapro.

## 2022-07-20 NOTE — Assessment & Plan Note (Signed)
Ok to restart low dose amlodipine.

## 2022-07-20 NOTE — Progress Notes (Signed)
   Established Patient Office Visit  Subjective   Patient ID: April Webster, female    DOB: 1975/04/06  Age: 47 y.o. MRN: [id number]  Chief Complaint  Patient presents with   ADHD    HPI  F/U ADHD - doing well with Concerta but would like to see a little more focus ability when things are more busy. Does well in quiet environment.  No SE.    BP's have been up and down again.  Were low for awhile but now up and down and not related to the ADD medication.   Recently start Trulance by GI, on day 2.    F/U Mood - Had recent GI eval and upper endoscopy.  Showed mild inflammation and she was having persistent dec appetite and occ nausea.  She stopped her Trintellix and this improved significant but then noticed irritability and mood worsened.     ROS    Objective:     BP (!) 146/75   Pulse (!) 55   Wt 123 lb (55.8 kg)   LMP  (Exact Date)   SpO2 100%   BMI 21.79 kg/m    Physical Exam Vitals and nursing note reviewed.  Constitutional:      Appearance: She is well-developed.  HENT:     Head: Normocephalic and atraumatic.  Cardiovascular:     Rate and Rhythm: Normal rate and regular rhythm.     Heart sounds: Normal heart sounds.  Pulmonary:     Effort: Pulmonary effort is normal.     Breath sounds: Normal breath sounds.  Skin:    General: Skin is warm and dry.  Neurological:     Mental Status: She is alert and oriented to person, place, and time.  Psychiatric:        Behavior: Behavior normal.      No results found for any visits on 07/20/22.    The 10-year ASCVD risk score (Arnett DK, et al., 2019) is: 0.8%    Assessment & Plan:   Problem List Items Addressed This Visit       Cardiovascular and Mediastinum   Raynaud's disease    Ok to restart low dose amlodipine.      Relevant Medications   amLODipine (NORVASC) 2.5 MG tablet     Other   MDD (major depressive disorder), recurrent episode (HCC)    Will switch to lexapro bc of dec appetite with  trintellix. 2 family members do well with Lexapro.       Relevant Medications   escitalopram (LEXAPRO) 10 MG tablet   Attention deficit hyperactivity disorder (ADHD), combined type - Primary    Will bump Concerta to 36mg  and try see if more helpful for focus.        Relevant Medications   methylphenidate (CONCERTA) 36 MG PO CR tablet    No follow-ups on file.    Nani Gasser, MD

## 2022-07-21 ENCOUNTER — Other Ambulatory Visit (HOSPITAL_COMMUNITY): Payer: Self-pay

## 2022-07-21 ENCOUNTER — Other Ambulatory Visit: Payer: Self-pay

## 2022-07-22 ENCOUNTER — Other Ambulatory Visit: Payer: Self-pay

## 2022-07-22 ENCOUNTER — Other Ambulatory Visit (INDEPENDENT_AMBULATORY_CARE_PROVIDER_SITE_OTHER): Payer: 59

## 2022-07-22 DIAGNOSIS — B349 Viral infection, unspecified: Secondary | ICD-10-CM

## 2022-07-22 DIAGNOSIS — R519 Headache, unspecified: Secondary | ICD-10-CM | POA: Diagnosis not present

## 2022-07-22 LAB — POCT INFLUENZA A/B
Influenza A, POC: NEGATIVE
Influenza B, POC: POSITIVE — AB

## 2022-07-22 LAB — POC COVID19 BINAXNOW: SARS Coronavirus 2 Ag: NEGATIVE

## 2022-07-23 ENCOUNTER — Other Ambulatory Visit: Payer: Self-pay

## 2022-07-23 ENCOUNTER — Other Ambulatory Visit: Payer: Self-pay | Admitting: Sports Medicine

## 2022-07-23 DIAGNOSIS — U071 COVID-19: Secondary | ICD-10-CM | POA: Diagnosis not present

## 2022-07-25 ENCOUNTER — Other Ambulatory Visit: Payer: Self-pay | Admitting: Family Medicine

## 2022-07-25 LAB — SARS-COV-2 RNA,(COVID-19) QUALITATIVE NAAT: SARS CoV2 RNA: NOT DETECTED

## 2022-07-26 ENCOUNTER — Other Ambulatory Visit: Payer: Self-pay

## 2022-07-26 MED ORDER — MELOXICAM 15 MG PO TABS
15.0000 mg | ORAL_TABLET | Freq: Every day | ORAL | 1 refills | Status: DC
  Filled 2022-07-26: qty 90, 90d supply, fill #0
  Filled 2022-10-19: qty 90, 90d supply, fill #1

## 2022-07-30 ENCOUNTER — Other Ambulatory Visit (HOSPITAL_COMMUNITY): Payer: Self-pay

## 2022-07-30 ENCOUNTER — Other Ambulatory Visit: Payer: Self-pay

## 2022-07-30 ENCOUNTER — Telehealth: Payer: Self-pay | Admitting: Family Medicine

## 2022-07-30 DIAGNOSIS — N951 Menopausal and female climacteric states: Secondary | ICD-10-CM

## 2022-07-30 MED ORDER — VEOZAH 45 MG PO TABS
1.0000 | ORAL_TABLET | Freq: Every day | ORAL | 2 refills | Status: DC
Start: 2022-07-30 — End: 2022-12-02
  Filled 2022-07-30: qty 30, 30d supply, fill #0
  Filled 2022-08-29: qty 30, 30d supply, fill #1

## 2022-07-30 NOTE — Telephone Encounter (Signed)
Meds ordered this encounter  Medications   Fezolinetant (VEOZAH) 45 MG TABS    Sig: Take 1 tablet (45 mg total) by mouth daily.    Dispense:  30 tablet    Refill:  2

## 2022-07-31 ENCOUNTER — Telehealth: Payer: Self-pay

## 2022-07-31 NOTE — Telephone Encounter (Signed)
Initiated Prior authorization ZOX:WRUEAV 45MG  tablets Via: Covermymeds Case/Key:[id number] Status: Pending as of 07/31/22 Reason: Notified Pt via: Mychart

## 2022-08-04 ENCOUNTER — Other Ambulatory Visit (HOSPITAL_BASED_OUTPATIENT_CLINIC_OR_DEPARTMENT_OTHER): Payer: Self-pay

## 2022-08-04 ENCOUNTER — Other Ambulatory Visit (HOSPITAL_COMMUNITY): Payer: Self-pay

## 2022-08-04 ENCOUNTER — Other Ambulatory Visit: Payer: Self-pay

## 2022-08-05 ENCOUNTER — Other Ambulatory Visit: Payer: Self-pay

## 2022-08-05 ENCOUNTER — Other Ambulatory Visit (HOSPITAL_COMMUNITY): Payer: Self-pay

## 2022-08-13 ENCOUNTER — Other Ambulatory Visit (HOSPITAL_COMMUNITY): Payer: Self-pay

## 2022-08-17 ENCOUNTER — Other Ambulatory Visit (HOSPITAL_COMMUNITY): Payer: Self-pay

## 2022-08-30 ENCOUNTER — Other Ambulatory Visit (HOSPITAL_COMMUNITY): Payer: Self-pay

## 2022-08-31 ENCOUNTER — Telehealth: Payer: Self-pay | Admitting: Sports Medicine

## 2022-08-31 ENCOUNTER — Other Ambulatory Visit (HOSPITAL_BASED_OUTPATIENT_CLINIC_OR_DEPARTMENT_OTHER): Payer: Self-pay

## 2022-08-31 DIAGNOSIS — J0100 Acute maxillary sinusitis, unspecified: Secondary | ICD-10-CM | POA: Insufficient documentation

## 2022-08-31 MED ORDER — AMOXICILLIN-POT CLAVULANATE 875-125 MG PO TABS
1.00 | ORAL_TABLET | Freq: Two times a day (BID) | ORAL | 0 refills | Status: DC
Start: 2022-08-31 — End: 2022-09-21
  Filled 2022-08-31: qty 20, 10d supply, fill #0

## 2022-08-31 MED ORDER — FLUCONAZOLE 150 MG PO TABS
150.0000 mg | ORAL_TABLET | Freq: Once | ORAL | 3 refills | Status: AC
Start: 2022-08-31 — End: 2022-09-01
  Filled 2022-08-31: qty 1, 1d supply, fill #0

## 2022-08-31 MED ORDER — AZELASTINE HCL 0.1 % NA SOLN
2.0000 | Freq: Two times a day (BID) | NASAL | 1 refills | Status: DC
Start: 2022-08-31 — End: 2022-12-02
  Filled 2022-08-31: qty 30, 30d supply, fill #0

## 2022-08-31 MED ORDER — PREDNISONE 50 MG PO TABS
50.0000 mg | ORAL_TABLET | Freq: Every day | ORAL | 0 refills | Status: DC
Start: 2022-08-31 — End: 2022-09-21
  Filled 2022-08-31: qty 5, 5d supply, fill #0

## 2022-08-31 NOTE — Telephone Encounter (Signed)
Acute maxillary sinusitis 3 weeks of facial pain and pressure, nasal discharge, headaches, adding 5 days of prednisone, nasal azelastine, Augmentin, Diflucan.

## 2022-08-31 NOTE — Assessment & Plan Note (Signed)
3 weeks of facial pain and pressure, nasal discharge, headaches, adding 5 days of prednisone, nasal azelastine, Augmentin, Diflucan.

## 2022-09-06 ENCOUNTER — Other Ambulatory Visit: Payer: Self-pay

## 2022-09-17 ENCOUNTER — Telehealth: Payer: Self-pay | Admitting: Family Medicine

## 2022-09-17 DIAGNOSIS — F330 Major depressive disorder, recurrent, mild: Secondary | ICD-10-CM

## 2022-09-17 NOTE — Telephone Encounter (Signed)
Would like ref for therapy.  Orders Placed This Encounter  Procedures   Ambulatory referral to Behavioral Health    Referral Priority:   Routine    Referral Type:   Psychiatric    Referral Reason:   Specialty Services Required    Requested Specialty:   Behavioral Health    Number of Visits Requested:   1

## 2022-09-21 ENCOUNTER — Telehealth: Payer: Self-pay | Admitting: Family Medicine

## 2022-09-21 ENCOUNTER — Other Ambulatory Visit: Payer: Self-pay | Admitting: Sports Medicine

## 2022-09-21 ENCOUNTER — Ambulatory Visit: Payer: 59 | Attending: Family Medicine

## 2022-09-21 DIAGNOSIS — R55 Syncope and collapse: Secondary | ICD-10-CM

## 2022-09-21 DIAGNOSIS — R001 Bradycardia, unspecified: Secondary | ICD-10-CM

## 2022-09-21 NOTE — Progress Notes (Unsigned)
Enrolled patient for a 7 day [employer] XT monitor to be mailed to patients home  Nahser to read

## 2022-09-21 NOTE — Telephone Encounter (Signed)
Had 2 more episodes of syncope in the evening.  Will get some additional labs.  A [employer] patch was ordered back in April but they never contacted her.  So we will get that order placed again.  Orders Placed This Encounter  Procedures   COMPLETE METABOLIC PANEL WITH GFR   TSH   CBC with Differential/Platelet   LONG TERM MONITOR (3-14 DAYS)    Standing Status:   Future    Standing Expiration Date:   09/21/2023    Scheduling Instructions:     She had had concomitant bradycardia and syncopal episode.    Order Specific Question:   Where should this test be performed?    Answer:   [facility]    Order Specific Question:   Does the patient have an implanted cardiac device?    Answer:   No    Order Specific Question:   Prescribed days of wear    Answer:   7    Order Specific Question:   Type of enrollment    Answer:   Home Enrollment    Order Specific Question:   Vendor:    Answer:   [employer]    Order Specific Question:   Notes for vendor    Answer:   Whichever vendor is covered under the [employer] program.

## 2022-09-22 ENCOUNTER — Other Ambulatory Visit: Payer: Self-pay

## 2022-09-22 DIAGNOSIS — R55 Syncope and collapse: Secondary | ICD-10-CM | POA: Diagnosis not present

## 2022-09-22 LAB — CBC WITH DIFFERENTIAL/PLATELET
Basophils Absolute: 18 cells/uL (ref 0–200)
Eosinophils Absolute: 90 cells/uL (ref 15–500)
Eosinophils Relative: 2.5 %
HCT: 42.7 % (ref 35.0–45.0)
Lymphs Abs: 1228 cells/uL (ref 850–3900)
MCH: 33.6 pg — ABNORMAL HIGH (ref 27.0–33.0)
RBC: 4.31 10*6/uL (ref 3.80–5.10)

## 2022-09-22 MED ORDER — TIZANIDINE HCL 4 MG PO TABS
4.0000 mg | ORAL_TABLET | Freq: Every day | ORAL | 3 refills | Status: DC
  Filled 2022-09-22: qty 90, 90d supply, fill #0
  Filled 2022-10-10 – 2022-12-19 (×2): qty 90, 90d supply, fill #1
  Filled 2023-03-27: qty 90, 90d supply, fill #2
  Filled 2023-06-21: qty 90, 90d supply, fill #3

## 2022-09-23 ENCOUNTER — Encounter: Payer: Self-pay | Admitting: Cardiovascular Disease

## 2022-09-23 DIAGNOSIS — M549 Dorsalgia, unspecified: Secondary | ICD-10-CM

## 2022-09-23 LAB — CBC WITH DIFFERENTIAL/PLATELET
Absolute Monocytes: 436 cells/uL (ref 200–950)
Basophils Relative: 0.5 %
Hemoglobin: 14.5 g/dL (ref 11.7–15.5)
MCHC: 34 g/dL (ref 32.0–36.0)
MCV: 99.1 fL (ref 80.0–100.0)
MPV: 10.7 fL (ref 7.5–12.5)
Monocytes Relative: 12.1 %
Neutro Abs: 1829 cells/uL (ref 1500–7800)
Neutrophils Relative %: 50.8 %
Platelets: 257 10*3/uL (ref 140–400)
RDW: 12.1 % (ref 11.0–15.0)
Total Lymphocyte: 34.1 %
WBC: 3.6 10*3/uL — ABNORMAL LOW (ref 3.8–10.8)

## 2022-09-23 LAB — COMPLETE METABOLIC PANEL WITH GFR
AG Ratio: 2 (calc) (ref 1.0–2.5)
ALT: 53 U/L — ABNORMAL HIGH (ref 6–29)
AST: 44 U/L — ABNORMAL HIGH (ref 10–35)
Albumin: 4.9 g/dL (ref 3.6–5.1)
Alkaline phosphatase (APISO): 65 U/L (ref 31–125)
BUN: 20 mg/dL (ref 7–25)
CO2: 27 mmol/L (ref 20–32)
Calcium: 10 mg/dL (ref 8.6–10.2)
Chloride: 101 mmol/L (ref 98–110)
Creat: 0.8 mg/dL (ref 0.50–0.99)
Globulin: 2.5 g/dL (calc) (ref 1.9–3.7)
Glucose, Bld: 98 mg/dL (ref 65–99)
Potassium: 4 mmol/L (ref 3.5–5.3)
Sodium: 141 mmol/L (ref 135–146)
Total Bilirubin: 0.7 mg/dL (ref 0.2–1.2)
Total Protein: 7.4 g/dL (ref 6.1–8.1)
eGFR: 92 mL/min/{1.73_m2} (ref >=60)

## 2022-09-23 LAB — TSH: TSH: 3.44 mIU/L

## 2022-09-23 NOTE — Progress Notes (Signed)
Hi April Webster, liver functions still just mildly elevated similar to what it has been in the past.  Hemoglobin looks great no anemia.  Thyroid is off a bit.  I really like to see it optimized between 1 and 2.  Have you run out of medication recently or had any difficulty getting it.

## 2022-09-24 DIAGNOSIS — R55 Syncope and collapse: Secondary | ICD-10-CM

## 2022-09-24 DIAGNOSIS — R001 Bradycardia, unspecified: Secondary | ICD-10-CM

## 2022-09-26 NOTE — Progress Notes (Addendum)
Cardiology Office Note:    Date:  09/27/2022   ID:  April Webster, DOB February 22, 1976, MRN [id number]  PCP:  Agapito Games, MD   [facility] Providers Cardiologist:  Elvera Almario    Referring MD: Agapito Games, *   No chief complaint on file.   History of Present Illness:    April Webster is a 47 y.o. female with a hx of hypotension  Has had HTN in the past Did not tolerate Lisinopril ( hypotension) Attributed her HTN to oral contraceptives.   Took losartan / HCTZ for a while  Last year,  started working out, better diet 155 lbs to 125 lbs  Has raynauds syndrome  Tried amlodipie and nifidipine   Has periodic episodes of hypotension  Has chronically low BP ( unless  she is stressed )   Diet is good  Hydrates well   Oct.25, 2023 Katera is seen for follow up visit for her episodes of orthostatic hypotension.       BP readings have looked great She has only needed Florifnef several times   Is active ,  working out some   Hx of HLD ,  LDL was 160 prior to atorvastatin  Strong family hx of CAD  Grandmother died at age 66  Uncles at age 25, another uncle in 2s   Will get a coronary calcium score    September 27, 2022  April Webster is seen today for several episodes of orthostasis / syncope. The most severe episode was in April Her husband woke her up from the couch from a deep sleep and she had orthostasis on the way to the bathroom and up stairs to the bedroom  She is on multiple meds that can cause / contribute to orthostasis/ dizziness.  Proscar  -she has been on this for 6 or 7 years and has done well with that. Concerta -she takes this only during workdays.  She is has not noticed any significant difference between her episodes of orthostatic hypotension on workdays versus weekends.  Zanaflex--was on baclofen for 6 or 7 years for neck and upper back pain.  It quit working for her.  She started the Zanaflex 3 months ago.   Desyrel - has been on for 6  months  Takes only 50   Takes amlodipine as needed.  This works out to be 1 or 2 times per month.  Takes hydrochlorothiazide 25 mg after eating a very salty meal if she develops hand swelling.  Eats well  Exercise is hit and miss    She hydrates very well but also drinks several glasses of Sunkist every day   Rarely drinks Gatorade 0 .   She is currently wearing a Zio patch monitor   Iv encouraged her to add some electrolytes to her routine water intake.        Past Medical History:  Diagnosis Date   Chronic idiopathic constipation    DDD (degenerative disc disease), cervical and lumbar    Female pattern hair loss    Graves disease    Hyperlipidemia    Hypertension    Hypothyroidism    Lichen sclerosus    Raynaud's disease     Past Surgical History:  Procedure Laterality Date   ABDOMINOPLASTY     AUGMENTATION MAMMAPLASTY Bilateral 05/2019   Silicone    CHOLECYSTECTOMY     DILITATION & CURRETTAGE/HYSTROSCOPY WITH NOVASURE ABLATION N/A 02/09/2022   Procedure: DILATATION & CURETTAGE/HYSTEROSCOPY WITH NOVASURE ABLATION, REMOVAL OF IUD;  Surgeon: Milas Hock, MD;  Location: [facility] OR;  Service: Gynecology;  Laterality: N/A;   PLACEMENT OF BREAST IMPLANTS     THYROID SURGERY     TUBAL LIGATION      Current Medications: Current Meds  Medication Sig   albuterol (VENTOLIN HFA) 108 (90 Base) MCG/ACT inhaler Inhale 2 puffs into the lungs every 6 (six) hours as needed for wheezing or shortness of breath.   amLODipine (NORVASC) 2.5 MG tablet Take 1 tablet (2.5 mg total) by mouth daily.   atorvastatin (LIPITOR) 20 MG tablet Take 1 tablet (20 mg total) by mouth daily.   escitalopram (LEXAPRO) 10 MG tablet Take 1 tablet (10 mg total) by mouth daily.   finasteride (PROSCAR) 5 MG tablet Take 0.5 tablets (2.5 mg total) by mouth daily.   meloxicam (MOBIC) 15 MG tablet Take 1 tablet (15 mg total) by mouth daily.   methylphenidate (CONCERTA) 36 MG PO CR tablet Take 1 tablet (36 mg  total) by mouth every morning.   Multiple Vitamins-Minerals (MULTIVITAMIN WITH MINERALS) tablet Take 1 tablet by mouth daily.   ondansetron (ZOFRAN) 4 MG tablet Take 1 tablet (4 mg total) by mouth every 8 (eight) hours as needed for nausea or vomiting.   pantoprazole (PROTONIX) 20 MG tablet Take 1 tablet (20 mg total) by mouth daily.   Plecanatide (TRULANCE) 3 MG TABS Take 1 tablet (3 mg total) by mouth daily.   sennosides-docusate sodium (SENOKOT-S) 8.6-50 MG tablet Take 1 tablet by mouth daily as needed for constipation.   SYNTHROID 112 MCG tablet Take 1 tablet (112 mcg total) by mouth daily before breakfast.   tiZANidine (ZANAFLEX) 4 MG tablet Take 1 tablet (4 mg total) by mouth at bedtime.   traZODone (DESYREL) 50 MG tablet Take 0.5-2 tablets (25-100 mg total) by mouth at bedtime.     Allergies:   Tuberculin purified protein derivative, Ambien [zolpidem], Cymbalta [duloxetine hcl], Fluoxetine, Tape, and Wound dressing adhesive   Social History   Socioeconomic History   Marital status: Married    Spouse name: Not on file   Number of children: 3   Years of education: Not on file   Highest education level: Doctorate  Occupational History   Occupation: NP at [employer]  Tobacco Use   Smoking status: Former    Current packs/day: 0.00    Average packs/day: 2.0 packs/day for 5.0 years (10.0 ttl pk-yrs)    Types: Cigarettes    Start date: 03/16/1991    Quit date: 03/15/1996    Years since quitting: 26.5   Smokeless tobacco: Never  Vaping Use   Vaping status: Never Used  Substance and Sexual Activity   Alcohol use: Yes    Alcohol/week: 2.0 standard drinks of alcohol    Types: 2 Glasses of wine per week    Comment: occ   Drug use: Never   Sexual activity: Yes    Partners: Male    Birth control/protection: I.U.D.  Other Topics Concern   Not on file  Social History Narrative   Not on file   Social Determinants of Health   Financial Resource Strain: Low Risk  (07/20/2022)    Overall Financial Resource Strain (CARDIA)    Difficulty of Paying Living Expenses: Not hard at all  Food Insecurity: No Food Insecurity (07/20/2022)   Hunger Vital Sign    Worried About Running Out of Food in the Last Year: Never true    Ran Out of Food in the Last Year: Never true  Transportation  Needs: No Transportation Needs (07/20/2022)   PRAPARE - Administrator, Civil Service (Medical): No    Lack of Transportation (Non-Medical): No  Physical Activity: Sufficiently Active (07/20/2022)   Exercise Vital Sign    Days of Exercise per Week: 5 days    Minutes of Exercise per Session: 30 min  Stress: No Stress Concern Present (07/20/2022)   [employer] of Occupational Health - Occupational Stress Questionnaire    Feeling of Stress : Only a little  Social Connections: Socially Isolated (07/20/2022)   Social Connection and Isolation Panel [NHANES]    Frequency of Communication with Friends and Family: Once a week    Frequency of Social Gatherings with Friends and Family: Once a week    Attends Religious Services: Never    Database administrator or Organizations: No    Attends Engineer, structural: Not on file    Marital Status: Married     Family History: The patient's family history includes Heart attack in her maternal grandmother; Hyperlipidemia in her mother; Hypertension in her mother; Lung cancer in her father; Osteoporosis in her mother; Stomach cancer in her maternal grandfather. There is no history of Colon cancer, Rectal cancer, Liver cancer, Esophageal cancer, or Pancreatic cancer.  ROS:   Please see the history of present illness.     All other systems reviewed and are negative.  EKGs/Labs/Other Studies Reviewed:    The following studies were reviewed today:   EKG:   EKG Interpretation Date/Time:  Monday September 27 2022 16:23:57 EDT Ventricular Rate:  65 PR Interval:  148 QRS Duration:  92 QT Interval:  398 QTC Calculation: 413 R Axis:   80  Text  Interpretation: Normal sinus rhythm Minimal voltage criteria for LVH, may be normal variant ( Sokolow-Lyon ) No previous ECGs available Confirmed by Kristeen Miss [id number]) on 09/27/2022 5:36:31 PM    Recent Labs: 09/22/2022: ALT 53; BUN 20; Creat 0.80; Hemoglobin 14.5; Platelets 257; Potassium 4.0; Sodium 141; TSH 3.44  Recent Lipid Panel    Component Value Date/Time   CHOL 155 05/12/2022 1348   TRIG 90 05/12/2022 1348   HDL 63 05/12/2022 1348   CHOLHDL 2.5 05/12/2022 1348   CHOLHDL 2.5 06/04/2021 0000   LDLCALC 75 05/12/2022 1348   LDLCALC 73 06/04/2021 0000     Risk Assessment/Calculations:           Physical Exam:     Physical Exam: Blood pressure 124/78, pulse 65, height 5\' 3"  (1.6 m), weight 131 lb 12.8 oz (59.8 kg), SpO2 98%.       GEN:  Well nourished, well developed in no acute distress HEENT: Normal NECK: No JVD; No carotid bruits LYMPHATICS: No lymphadenopathy CARDIAC: RRR soft systolic murmur at the Left sternal border.   Does not seem to radiate to her left axillary line  RESPIRATORY:  Clear to auscultation without rales, wheezing or rhonchi  ABDOMEN: Soft, non-tender, non-distended MUSCULOSKELETAL:  No edema; No deformity  SKIN: Warm and dry NEUROLOGIC:  Alert and oriented x 3     ASSESSMENT:    1. Syncope, unspecified syncope type   Tom presents after having an episode of syncope/passing out that occurred late at night when she was awakened out of a deep sleep from the couch.  She fell multiple times going to the bathroom and then on the way up the stairs to her bedroom.  It is possible that she was actually never fully awake and that this was just due  to being in a very deep sleep with corresponding low heart rate and low blood pressure.  She is on multiple medications that can contribute to orthostatic hypotension and dizziness.  We have gone through these each individually.  She has been on most of these for quite a long time and has not had any  specific issues.  I encouraged her to hydrate regularly including adding some sodium chloride and potassium chloride to at least 1 bottle of her drinking water each day.  I encouraged her to drink V8 but she does not like tomato juice.  She will try to hydrate well.  I will see her again in 3 months for follow-up visit.  2.  Systolic murmur: She has a systolic murmur.  I did not hear a diastolic murmur corresponding to her aortic insufficiency.  This murmur sounds unchanged from last year.  Will continue to follow periodically.    3.  Hyperlipidemia:   She has a history of hyperlipidemia.  Her LDL was 160 prior to atorvastatin.  With atorvastatin is down into the mid 80s. She has a strong family history of premature coronary artery disease.  I think she would benefit from getting a coronary calcium scoreIf her calcium score is high we would be much more aggressive with her lipid lowering I would suggest an LDL level of 55..   Medication Adjustments/Labs and Tests Ordered: Current medicines are reviewed at length with the patient today.  Concerns regarding medicines are outlined above.  Orders Placed This Encounter  Procedures   EKG 12-Lead   No orders of the defined types were placed in this encounter.   There are no Patient Instructions on file for this visit.   Signed, Kristeen Miss, MD  09/27/2022 4:28 PM    [facility]

## 2022-09-27 ENCOUNTER — Ambulatory Visit: Payer: 59 | Attending: Cardiovascular Disease | Admitting: Cardiovascular Disease

## 2022-09-27 ENCOUNTER — Encounter: Payer: Self-pay | Admitting: Cardiovascular Disease

## 2022-09-27 VITALS — BP 124/78 | HR 65 | Ht 63.0 in | Wt 131.8 lb

## 2022-09-27 DIAGNOSIS — R55 Syncope and collapse: Secondary | ICD-10-CM

## 2022-09-27 DIAGNOSIS — I951 Orthostatic hypotension: Secondary | ICD-10-CM

## 2022-09-27 NOTE — Patient Instructions (Signed)
Medication Instructions:  Your physician recommends that you continue on your current medications as directed. Please refer to the Current Medication list given to you today.  *If you need a refill on your cardiac medications before your next appointment, please call your pharmacy*   Lab Work: NONE If you have labs (blood work) drawn today and your tests are completely normal, you will receive your results only by: MyChart Message (if you have MyChart) OR A paper copy in the mail If you have any lab test that is abnormal or we need to change your treatment, we will call you to review the results.   Testing/Procedures: NONE   Follow-Up: At [facility], you and your health needs are our priority.  As part of our continuing mission to provide you with exceptional heart care, we have created designated Provider Care Teams.  These Care Teams include your primary Cardiologist (physician) and Advanced Practice Providers (APPs -  Physician Assistants and Nurse Practitioners) who all work together to provide you with the care you need, when you need it.  We recommend signing up for the patient portal called "MyChart".  Sign up information is provided on this After Visit Summary.  MyChart is used to connect with patients for Virtual Visits (Telemedicine).  Patients are able to view lab/test results, encounter notes, upcoming appointments, etc.  Non-urgent messages can be sent to your provider as well.   To learn more about what you can do with MyChart, go to [email or web address].    Your next appointment:   3 month(s)  Provider:   Philip Nahser, MD    

## 2022-10-05 DIAGNOSIS — R55 Syncope and collapse: Secondary | ICD-10-CM | POA: Diagnosis not present

## 2022-10-05 DIAGNOSIS — R001 Bradycardia, unspecified: Secondary | ICD-10-CM | POA: Diagnosis not present

## 2022-10-06 ENCOUNTER — Ambulatory Visit: Payer: 59

## 2022-10-06 NOTE — Progress Notes (Signed)
April Webster, I am sure you have seen the results already just showing normal sinus rhythm with some intermittent bradycardia and tachycardia, no surprise.  Just a few rare PVCs.  And no worrisome arrhythmias.  The lowest heart rate they call was around 49 which is reassuring.  I will forward these to Dr. Elease Hashimoto as well

## 2022-10-10 ENCOUNTER — Other Ambulatory Visit: Payer: Self-pay | Admitting: Family Medicine

## 2022-10-10 DIAGNOSIS — F5101 Primary insomnia: Secondary | ICD-10-CM

## 2022-10-11 ENCOUNTER — Other Ambulatory Visit: Payer: Self-pay

## 2022-10-11 ENCOUNTER — Other Ambulatory Visit (HOSPITAL_COMMUNITY): Payer: Self-pay

## 2022-10-11 MED ORDER — TRAZODONE HCL 50 MG PO TABS
25.0000 mg | ORAL_TABLET | Freq: Every day | ORAL | 3 refills | Status: DC
Start: 2022-10-11 — End: 2023-06-21
  Filled 2022-10-11: qty 60, 30d supply, fill #0
  Filled 2023-01-12: qty 60, 30d supply, fill #1
  Filled 2023-02-07: qty 60, 30d supply, fill #2
  Filled 2023-03-09: qty 60, 30d supply, fill #3

## 2022-10-11 MED ORDER — ESCITALOPRAM OXALATE 10 MG PO TABS
10.0000 mg | ORAL_TABLET | Freq: Every day | ORAL | 0 refills | Status: DC
  Filled 2022-10-11: qty 90, 90d supply, fill #0

## 2022-10-12 ENCOUNTER — Other Ambulatory Visit: Payer: Self-pay

## 2022-10-12 ENCOUNTER — Other Ambulatory Visit (HOSPITAL_COMMUNITY): Payer: Self-pay

## 2022-10-19 ENCOUNTER — Other Ambulatory Visit (HOSPITAL_COMMUNITY): Payer: Self-pay

## 2022-10-20 ENCOUNTER — Ambulatory Visit (INDEPENDENT_AMBULATORY_CARE_PROVIDER_SITE_OTHER): Payer: 59 | Admitting: Professional

## 2022-10-20 ENCOUNTER — Encounter: Payer: Self-pay | Admitting: Professional

## 2022-10-20 DIAGNOSIS — F330 Major depressive disorder, recurrent, mild: Secondary | ICD-10-CM

## 2022-10-20 DIAGNOSIS — F902 Attention-deficit hyperactivity disorder, combined type: Secondary | ICD-10-CM | POA: Diagnosis not present
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
## 2022-10-20 NOTE — Progress Notes (Signed)
° ° ° ° ° ° ° ° ° ° ° ° ° ° °   , [facility] °

## 2022-10-20 NOTE — Progress Notes (Signed)
[facility] Counselor/Therapist Progress Note

## 2022-10-22 ENCOUNTER — Ambulatory Visit (INDEPENDENT_AMBULATORY_CARE_PROVIDER_SITE_OTHER): Payer: 59

## 2022-10-22 DIAGNOSIS — M5134 Other intervertebral disc degeneration, thoracic region: Secondary | ICD-10-CM | POA: Diagnosis not present

## 2022-10-22 DIAGNOSIS — M549 Dorsalgia, unspecified: Secondary | ICD-10-CM

## 2022-11-02 NOTE — Progress Notes (Signed)
April Webster, you do have some to space narrowing and spurring at T7-8.  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  You have a second 1 right next to it that kind of is borderline.

## 2022-11-03 ENCOUNTER — Ambulatory Visit (INDEPENDENT_AMBULATORY_CARE_PROVIDER_SITE_OTHER): Payer: 59

## 2022-11-03 ENCOUNTER — Other Ambulatory Visit (HOSPITAL_COMMUNITY): Payer: Self-pay

## 2022-11-03 ENCOUNTER — Ambulatory Visit (INDEPENDENT_AMBULATORY_CARE_PROVIDER_SITE_OTHER): Payer: 59 | Admitting: Professional

## 2022-11-03 ENCOUNTER — Encounter: Payer: Self-pay | Admitting: Professional

## 2022-11-03 DIAGNOSIS — Z01419 Encounter for gynecological examination (general) (routine) without abnormal findings: Secondary | ICD-10-CM | POA: Diagnosis not present

## 2022-11-03 DIAGNOSIS — F330 Major depressive disorder, recurrent, mild: Secondary | ICD-10-CM | POA: Diagnosis not present

## 2022-11-03 DIAGNOSIS — F902 Attention-deficit hyperactivity disorder, combined type: Secondary | ICD-10-CM

## 2022-11-03 DIAGNOSIS — Z1231 Encounter for screening mammogram for malignant neoplasm of breast: Secondary | ICD-10-CM | POA: Diagnosis not present

## 2022-11-03 NOTE — Progress Notes (Addendum)
Behavioral Health Counselor/Therapist Progress Note  Patient ID: April Webster, MRN: [id number],    Date: 11/03/2022  Time Spent: 53 minutes [id number]  Treatment Type: Individual Therapy  Risk Assessment: Danger to Self:  No Self-injurious Behavior: No Danger to Others: No  Subjective:  This session was held via video teletherapy. The patient consented to video teletherapy and was located at her home during this session. She is aware it is the responsibility of the patient to secure confidentiality on her end of the session. The provider was in a private home office for the duration of this session.    The patient arrived on early for her Caregility session.  Issues addressed: 1-work stressors -had a person come and help her and her assistant to streamline their patient and documentation workload -shared some challenges with today's patients 2-martial issues have been difficult -"lately it has come to a head" -she told her husband she doesn't know if she wants to be married anymore -her husband moves from addiction to addiction, started with alcohol and goes to excess spending -she blames him for her stressors in their personal life -he pushes so much that she is over it -they had started the conversation via text related to ending the marriage -she texted that she is unsure she an do anymore -once home he listened and was receptive to conversation -he doesn't want to call it quits he wants to work it out -he said that he needs help -she thinks he understands and that the ball is in his court -she outline her expectation that he gets work to help support the family -she told him he needs to get help to learn how to control his behaviors -pt admits that in 2013 she stepped out on him due to no emotional, financial, partnership, and sexual needs -she was going to dinner with a girlfriend and they were going to have several drinks and her friend's husband will be DD   -pt's  spouse became angry and he made some angry remarks about her toward the children 3-treatment planning -patient identified marital, work, and physical health as primary stressors -patient and Clinician created treatment plan in session -patient actively participated and fully agrees with treatment plan  Treatment Plan Problems Addressed  Intimate Relationship Conflicts, Low Self-Esteem, Unipolar Depression, Vocational Stress  Goals 1. Alleviate depressive symptoms and return to previous level of effective functioning. Objective Identify and replace thoughts and beliefs that support depression. Target Date: 2023-11-05 Frequency: Biweekly  Progress: 0 Modality: individual  Related Interventions Conduct Cognitive-Behavioral Therapy (see Cognitive Behavior Therapy by Reola Calkins; Overcoming Depression by Agapito Games al.), beginning with helping the client learn the connection among cognition, depressive feelings, and actions. Assign the client to self-monitor thoughts, feelings, and actions in daily journal (e.g., "Negative Thoughts Trigger Negative Feelings" in the Adult Psychotherapy Homework Planner by Stephannie Li; "Daily Record of Dysfunctional Thoughts" in Cognitive Therapy of Depression by Ashby Dawes and Shea Evans); process the journal material to challenge depressive thinking patterns and replace them with reality-based thoughts. Assign "behavioral experiments" in which depressive automatic thoughts are treated as hypotheses/prediction, reality-based alternative hypotheses/prediction are generated, and both are tested against the client's past, present, and/or future experiences. Facilitate and reinforce the client's shift from biased depressive self-talk and beliefs to reality-based cognitive messages that enhance self-confidence and increase adaptive actions (see "Positive Self-Talk" in the Adult Psychotherapy Homework Planner by Stephannie Li). Objective Learn and implement behavioral strategies to  overcome depression. Target Date: 2023-11-05 Frequency: Biweekly  Progress:  0 Modality: individual  Related Interventions Engage the client in "behavioral activation," increasing his/her activity level and contact with sources of reward, while identifying processes that inhibit activation (see Behavioral Activation for Depression by Katharine Look, Dimidjian, and Herman-Dunn; or assign "Identify and Schedule Pleasant Activities" in the Adult Psychotherapy Homework Planner by [facility]); use behavioral techniques such as instruction, rehearsal, role-playing, role reversal, as needed, to facilitate activity in the client's daily life; reinforce success. Assist the client in developing skills that increase the likelihood of deriving pleasure from behavioral activation (e.g., assertiveness skills, developing an exercise plan, less internal/more external focus, increased social involvement); reinforce success. Objective Learn and implement problem-solving and decision-making skills. Target Date: 2023-11-05 Frequency: Biweekly  Progress: 0 Modality: individual  Related Interventions Conduct Problem-Solving Therapy (see Problem-Solving Therapy by Domenick Bookbinder and Rob Hickman) using techniques such as psychoeducation, modeling, and role-playing to teach client problem-solving skills (i.e., defining a problem specifically, generating possible solutions, evaluating the pros and cons of each solution, selecting and implementing a plan of action, evaluating the efficacy of the plan, accepting or revising the plan); role-play application of the problem-solving skill to a real life issue (or assign "Applying Problem-Solving to Interpersonal Conflict" in the Adult Psychotherapy Homework Planner by Stephannie Li). Objective Learn and implement conflict resolution skills to resolve interpersonal problems. Target Date: 2023-11-05 Frequency: Biweekly  Progress: 0 Modality: individual  Related Interventions Teach conflict resolution skills  (e.g., empathy, active listening, "I messages," respectful communication, assertiveness without aggression, compromise); use psychoeducation, modeling, role-playing, and rehearsal to work through several current conflicts; assign homework exercises; review and repeat so as to integrate their use into the client's life. Help the client resolve depression related to interpersonal problems through the use of reassurance and support, clarification of cognitive and affective triggers that ignite conflicts, and active problem-solving (or assign "Applying Problem-Solving to Interpersonal Conflict" in the Adult Psychotherapy Homework Planner by Stephannie Li). Objective Verbalize an understanding of healthy and unhealthy emotions with the intent of increasing the use of healthy emotions to guide actions. Target Date: 2023-11-05 Frequency: Biweekly  Progress: 0 Modality: individual  Related Interventions Use a process-experiential approach consistent with Emotion-Focused Therapy to create a safe, nurturing environment in which the client can process emotions, learning to identify and regulate unhealthy feelings and to generate more adaptive ones that then guide actions (see Emotion-Focused Therapy for Depression by Peter Minium). 2. Develop healthy thinking patterns and beliefs about self, others, and the world that lead to the alleviation and help prevent the relapse of depression. 3. Develop the necessary skills for effective, open communication, mutually satisfying sexual intimacy, and enjoyable time for companionship within the relationship. 4. Elevate self-esteem. Objective Increase insight into the historical and current sources of low self-esteem. Target Date: 2023-11-05 Frequency: Biweekly  Progress: 0 Modality: individual  Related Interventions Help the client become aware of his/her fear of rejection and its connection with past rejection or abandonment experiences; begin to contrast past  experiences of pain with present experiences of acceptance and competence. Objective Identify and replace negative self-talk messages used to reinforce low self-esteem. Target Date: 2023-11-05 Frequency: Biweekly  Progress: 0 Modality: individual  Related Interventions Help the client identify his/her distorted, negative beliefs about self and the world and replace these messages with more realistic, affirmative messages (or assign "Journal and Replace Self-Defeating Thoughts" in the Adult Psychotherapy Homework Planner by [facility] or read What to Say When You Talk to Yourself by Helm[employer]). Ask the client to complete and process self-esteem-building exercises from recommended self-help books (  e.g., Ten Days to Self Esteem! by Lawerance Bach; The Self-Esteem Companion by Murvin Donning, [place], and [employer]; 10 Simple Solutions for [employer] by [employer]). Objective Identify and engage in activities that would improve self-image by being consistent with one's values. Target Date: 2023-11-05 Frequency: Biweekly  Progress: 0 Modality: individual  Related Interventions Help the client analyze his/her values and the congruence or incongruence between them and the client's daily activities. Identify and assign activities congruent with the client's values; process them toward improving self-concept and self-esteem. Objective Increase the frequency of assertive behaviors. Target Date: 2023-11-05 Frequency: Biweekly  Progress: 0 Modality: individual  Related Interventions Train the client in assertiveness or refer him/her to a group that will educate and facilitate assertiveness skills via lectures and assignments. Objective Demonstrate an increased ability to identify and express personal feelings. Target Date: 2023-11-05 Frequency: Biweekly  Progress: 0 Modality: individual  Related Interventions Assign the client to keep a journal of feelings on a daily basis. Objective Articulate a plan  to be proactive in trying to get identified needs met. Target Date: 2023-11-05 Frequency: Biweekly  Progress: 0 Modality: individual  Related Interventions Assist the client in identifying and verbalizing his/her needs, met and unmet. Assist the client in developing a specific action plan to get each need met (or assign "Satisfying Unmet Emotional Needs" in the Adult Psychotherapy Homework Planner by [facility]). Objective Decrease the frequency of negative self-descriptive statements and increase frequency of positive self-descriptive statements. Target Date: 2023-11-05 Frequency: Biweekly  Progress: 0 Modality: individual  Related Interventions Assist the client in becoming aware of how he/she expresses or acts out negative feelings about himself/herself. Help the client reframe his/her negative assessment of himself/herself. 5. Establish an inward sense of self-worth, confidence, and competence. 6. Increase awareness of own role in the relationship conflicts. Objective Identify problems and strengths in the relationship, including one's own role in each. Target Date: 2023-11-05 Frequency: Biweekly  Progress: 0 Modality: individual  Related Interventions Assess current, ongoing problems in the relationship, including possible abuse/neglect, substance use, communication, conflict resolution, as well as home environment (if domestic violence is present, plan for safety and avoid early use of conjoint sessions; see the Physical Abuse chapter in The Couples Psychotherapy Treatment Planner by Genoveva Ill, and Jongsma). Assess strengths in the relationship that could be enhanced during the therapy to facilitate the accomplishment of therapeutic goals. Objective Make a commitment to change specific behaviors that have been identified by self or the partner. Target Date: 2023-11-05 Frequency: Biweekly  Progress: 0 Modality: individual  Related Interventions Process the list of positive and  problematic features of each partner and the relationship; ask couple to agree to work on changes he/she needs to make to improve the relationship, generating a list of targeted changes (or assign "How Can We Meet Each Other's Needs and Desires?" in the Adult Psychotherapy Homework Planner by Stephannie Li). Objective Increase the frequency of the direct expression of honest, respectful, and positive feelings and thoughts within the relationship. Target Date: 2023-11-05 Frequency: Biweekly  Progress: 0 Modality: individual  Related Interventions Assist the couple in identifying conflicts that can be addressed using communication, conflict-resolution, and/or problem-solving skills (see "Behavioral Marital Therapy" by Loyce Dys). Use behavioral techniques (education, modeling, role-playing, corrective feedback, and positive reinforcement) to teach communication skills including assertive communication, offering positive feedback, active listening, making positive requests of others for behavior change, and giving negative feedback in an honest and respectful manner. Objective Learn and implement problem-solving and conflict resolution skills. Target Date:  2023-11-05 Frequency: Biweekly  Progress: 0 Modality: individual  Related Interventions Review how newly learned communication skills can be applied to conflict resolution through calm, respectful, effective dialogue; role-play application of this skill to a present conflict situation. Use behavioral techniques (education, modeling, role-playing, corrective feedback, and positive reinforcement) to teach the couple problem-solving and conflict resolution skills including defining the problem constructively and specifically, brainstorming options, evaluating options, compromise, choosing options and implementing a plan, evaluating the results. Objective Understand the origin of each other's negative emotions and reactions and develop more  constructive interactions that fill needs. Target Date: 2023-11-05 Frequency: Biweekly  Progress: 0 Modality: individual  Related Interventions Encourage the clients to recognize, reframe, and express these insecurities toward resolving negative emotional and behavioral reactions. Assist the clients in developing more constructive interactions that satisfy attachment needs such as increased intimacy and expressions of love (or assign "How Can We Meet Each Other's Needs and Desires?" in the Adult Psychotherapy Homework Planner by Stephannie Li). Objective Increase time spent in enjoyable contact with the partner. Target Date: 2023-11-05 Frequency: Biweekly  Progress: 0 Modality: individual  Related Interventions Assist the couple in identifying and planning rewarding social/recreational activities that can be shared with the partner (or assign "Identify and Schedule Pleasant Activities" in the Adult Psychotherapy Homework Planner by Stephannie Li). Objective Identify any patterns of destructive and/or abusive behavior in the relationship. Target Date: 2023-11-05 Frequency: Biweekly  Progress: 0 Modality: individual  Related Interventions Assess current patterns of destructive and/or abusive behavior for each partner, including those that existed in each family of origin (if domestic violence is present, plan for safety and avoid early use of conjoint sessions; see the Physical Abuse chapter in The Couples Psychotherapy Treatment Planner by Genoveva Ill, and Jongsma). 7. Increase job satisfaction and performance due to implementation of assertiveness and stress management strategies. 8. Increase sense of confidence and competence in dealing with work responsibilities. Objective Implement assertiveness skills. Target Date: 2023-11-05 Frequency: Biweekly  Progress: 0 Modality: individual  Related Interventions Train the client in assertiveness skills or refer to assertiveness training class that teaches  effective communication of needs and feelings without aggression or defensiveness. Objective Learn and implement problem-solving skills. Target Date: 2023-11-05 Frequency: Biweekly  Progress: 0 Modality: individual  Related Interventions Conduct Problem-Solving Therapy (see Problem-Solving Therapy by Domenick Bookbinder and Rob Hickman) using techniques such as psychoeducation, modeling, and role-playing to teach the client problem-solving skills (i.e., defining a problem specifically, generating possible solutions, evaluating the pros and cons of each solution, selecting and implementing a plan of action, evaluating the efficacy of the plan, accepting or revising the plan); role-play application of the problem-solving skill to a real life issue (or assign "Applying Problem-Solving to Interpersonal Conflict" in the Adult Psychotherapy Homework Planner by Stephannie Li). Objective Verbalize healthy, realistic cognitive messages that promote harmony with others, self-acceptance, and self-confidence. Target Date: 2023-11-05 Frequency: Biweekly  Progress: 0 Modality: individual  Related Interventions Teach the client the connection between thoughts, feelings, and behavior; train the client in the development of more realistic, healthy cognitive messages that relieve anxiety and depression. Objective Learn and implement calming skills to reduce overall anxiety and manage anxiety symptoms. Target Date: 2023-11-05 Frequency: Biweekly  Progress: 0 Modality: individual  Related Interventions Teach the client calming/relaxation skills (e.g., applied relaxation, progressive muscle relaxation, cue controlled relaxation, mindful breathing, biofeedback) and how to discriminate better between relaxation and tension; teach the client how to apply these skills to his/her daily life (e.g., New Directions in Progressive Muscle Relaxation by Marcelyn Ditty,  and Hazlett-Stevens; The Relaxation and Stress Reduction Workbook by Kevan Rosebush, and [place]). Objective Identify any personal problems that may be causing conflict in the employment setting. Target Date: 2023-11-05 Frequency: Biweekly  Progress: 0 Modality: individual  Related Interventions Explore the client's transfer of personal problems to the employment situation. Objective Identify the effect that vocational stress has on feelings toward self and relationships with significant others. Target Date: 2023-11-05 Frequency: Biweekly  Progress: 0 Modality: individual  Related Interventions Explore the effect of the client's vocational stress on his/her intra- and interpersonal dynamics with friends and family. Objective Develop and verbalize a plan for constructive action to reduce vocational stress. Target Date: 2023-11-05 Frequency: Biweekly  Progress: 0 Modality: individual  Related Interventions Assist the client in developing a plan to react positively to his/her vocational situation (or assign "My Vocational Action Plan" in the Adult Psychotherapy Homework Planner by [facility]); process the proactive plan and assist in its implementation.  Diagnosis:Mild episode of recurrent major depressive disorder (HCC)  Attention deficit hyperactivity disorder (ADHD), combined type  Plan:  -meet biweekly to address marital, occupational, physical health, and self-esteem issues -next session will be on Wednesday, November 17, 2022 at 1pm

## 2022-11-03 NOTE — Progress Notes (Signed)
° ° ° ° ° ° ° ° ° ° ° ° ° ° °  Kathy Collins, LCMHC °

## 2022-11-04 ENCOUNTER — Telehealth: Payer: Self-pay | Admitting: Sports Medicine

## 2022-11-04 DIAGNOSIS — M5030 Other cervical disc degeneration, unspecified cervical region: Secondary | ICD-10-CM

## 2022-11-04 NOTE — Assessment & Plan Note (Signed)
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 MRI from 2022 did reveal multilevel cervical DDD, mild, worse C5-C6 with foraminal stenosis. At this point she has failed formal physical therapy, gabapentin, tricyclics, oral steroids, NSAIDs, excessive sedation with other GABAergic drugs.  Narcotics ineffective and intolerable.  Proceeding with cervical epidural. Return 6 weeks after epidural.

## 2022-11-04 NOTE — Telephone Encounter (Signed)
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 MRI from 2022 did reveal multilevel cervical DDD, mild, worse C5-C6 with foraminal stenosis. At this point she has failed formal physical therapy, gabapentin, tricyclics, oral steroids, NSAIDs, excessive sedation with other GABAergic drugs.  Narcotics ineffective and intolerable.  Proceeding with cervical epidural. Return 6 weeks after epidural.

## 2022-11-11 ENCOUNTER — Other Ambulatory Visit (HOSPITAL_COMMUNITY): Payer: Self-pay

## 2022-11-11 ENCOUNTER — Other Ambulatory Visit (INDEPENDENT_AMBULATORY_CARE_PROVIDER_SITE_OTHER): Payer: 59 | Admitting: Sports Medicine

## 2022-11-11 ENCOUNTER — Other Ambulatory Visit: Payer: Self-pay | Admitting: Sports Medicine

## 2022-11-11 DIAGNOSIS — M5030 Other cervical disc degeneration, unspecified cervical region: Secondary | ICD-10-CM

## 2022-11-11 MED ORDER — TRIAZOLAM 0.25 MG PO TABS
0.2500 mg | ORAL_TABLET | ORAL | 0 refills | Status: DC
Start: 2022-11-11 — End: 2022-12-02
  Filled 2022-11-11: qty 2, 1d supply, fill #0

## 2022-11-11 MED ORDER — PREGABALIN 50 MG PO CAPS
ORAL_CAPSULE | ORAL | 3 refills | Status: DC
Start: 2022-11-11 — End: 2022-12-06
  Filled 2022-11-11: qty 90, 28d supply, fill #0

## 2022-11-12 ENCOUNTER — Other Ambulatory Visit (HOSPITAL_COMMUNITY): Payer: Self-pay

## 2022-11-14 ENCOUNTER — Other Ambulatory Visit: Payer: Self-pay | Admitting: Gastroenterology

## 2022-11-16 ENCOUNTER — Other Ambulatory Visit (HOSPITAL_COMMUNITY): Payer: Self-pay

## 2022-11-16 ENCOUNTER — Other Ambulatory Visit: Payer: Self-pay

## 2022-11-16 MED ORDER — TRULANCE 3 MG PO TABS
3.0000 mg | ORAL_TABLET | Freq: Every day | ORAL | 5 refills | Status: DC
  Filled 2022-11-16: qty 30, 30d supply, fill #0
  Filled 2023-01-12: qty 30, 30d supply, fill #1
  Filled 2023-02-07: qty 30, 30d supply, fill #2
  Filled 2023-03-09: qty 30, 30d supply, fill #3
  Filled 2023-04-12 – 2023-05-27 (×2): qty 30, 30d supply, fill #4
  Filled 2023-06-21: qty 30, 30d supply, fill #5

## 2022-11-17 ENCOUNTER — Ambulatory Visit (INDEPENDENT_AMBULATORY_CARE_PROVIDER_SITE_OTHER): Payer: 59 | Admitting: Professional

## 2022-11-17 ENCOUNTER — Encounter: Payer: Self-pay | Admitting: Professional

## 2022-11-17 DIAGNOSIS — F902 Attention-deficit hyperactivity disorder, combined type: Secondary | ICD-10-CM | POA: Diagnosis not present

## 2022-11-17 DIAGNOSIS — F331 Major depressive disorder, recurrent, moderate: Secondary | ICD-10-CM | POA: Diagnosis not present

## 2022-11-17 NOTE — Progress Notes (Signed)
[facility] Counselor/Therapist Progress Note  Patient ID: April Webster, MRN: [id number],    Date: 11/17/2022  Time Spent: 51 minutes 1-151pm  Treatment Type: Individual Therapy  Risk Assessment: Danger to Self:  No Self-injurious Behavior: No Danger to Others: No  Subjective:  This session was held via video teletherapy. The patient consented to video teletherapy and was located at her home during this session. She is aware it is the responsibility of the patient to secure confidentiality on her end of the session. The provider was in a private home office for the duration of this session.    The patient arrived on early for her Caregility session.  Issues addressed: 1-physical Having several pain issues related to physical health -she is now on Lyrica per Dr. Karie Schwalbe -she has been rocking on the sofa in tears -right now sitting in office in dark she is a 3 or 4 -she will be getting epidural injections to reduce the inflammation and help with the pain in the nerve section and she is having muscle spasms in neck -she is not in a pain management clinic "that's the one step I haven't gone to yet -she hates the hassle of drug screening, coming back for visits -she is holding off for as long as possible 2-marital -she had an in depth conversation with her spouse and he has been showing some improvements -he has not asked her or any money -her husband cleared all his collectibles out of their bedroom -he is aware that his behavior will decide what happens with their marriage 3-professional -she discovered that there were 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 because there is not enough staffing to keep up -she has began weeding through the referrals so that people are getting the needed services -she will have candid conversations with her patients who are not working as hard as she -she struggles to set boundaries when a patient is in front of her and has a list of  issues Since [employer] came through the MA's are assisting in setting a boundary top two issues the patient wants to address -there is a new MA [name] who is making careless errors -management is reportedly dealing with the issues with [name]   -she has to go through corrective action before she can be terminated   -pt doesn't know how she ends up "being the dumping ground" -she feels unsupported by management and she has addressed with other providers  -pt has had stress for the past few weeks to update her pap smears and the staff sent a letter to all of her female patients that they need a pap. She has had to cover questions because many paitents do not even need 4-oldest daughter cam to visit yesterday and she just sat and did not do the work -she has trouble at the end of day writing progress notes because she cannot think; she is able to do after leaving work for [employer].  Treatment Plan Problems Addressed  Intimate Relationship Conflicts, Low Self-Esteem, Unipolar Depression, Vocational Stress  Goals 1. Alleviate depressive symptoms and return to previous level of effective functioning. Objective Identify and replace thoughts and beliefs that support depression. Target Date: 2023-11-05 Frequency: Biweekly  Progress: 0 Modality: individual  Related Interventions Conduct Cognitive-Behavioral Therapy (see Cognitive Behavior Therapy by Reola Calkins; Overcoming Depression by Agapito Games al.), beginning with helping the client learn the connection among cognition, depressive feelings, and actions. Assign the client to self-monitor thoughts, feelings, and actions  in daily journal (e.g., "Negative Thoughts Trigger Negative Feelings" in the Adult Psychotherapy Homework Planner by Stephannie Li; "Daily Record of Dysfunctional Thoughts" in Cognitive Therapy of Depression by Ashby Dawes and Shea Evans); process the journal material to challenge depressive thinking patterns and replace them with  reality-based thoughts. Assign "behavioral experiments" in which depressive automatic thoughts are treated as hypotheses/prediction, reality-based alternative hypotheses/prediction are generated, and both are tested against the client's past, present, and/or future experiences. Facilitate and reinforce the client's shift from biased depressive self-talk and beliefs to reality-based cognitive messages that enhance self-confidence and increase adaptive actions (see "Positive Self-Talk" in the Adult Psychotherapy Homework Planner by Stephannie Li). Objective Learn and implement behavioral strategies to overcome depression. Target Date: 2023-11-05 Frequency: Biweekly  Progress: 0 Modality: individual  Related Interventions Engage the client in "behavioral activation," increasing his/her activity level and contact with sources of reward, while identifying processes that inhibit activation (see Behavioral Activation for Depression by Katharine Look, Dimidjian, and Herman-Dunn; or assign "Identify and Schedule Pleasant Activities" in the Adult Psychotherapy Homework Planner by [facility]); use behavioral techniques such as instruction, rehearsal, role-playing, role reversal, as needed, to facilitate activity in the client's daily life; reinforce success. Assist the client in developing skills that increase the likelihood of deriving pleasure from behavioral activation (e.g., assertiveness skills, developing an exercise plan, less internal/more external focus, increased social involvement); reinforce success. Objective Learn and implement problem-solving and decision-making skills. Target Date: 2023-11-05 Frequency: Biweekly  Progress: 0 Modality: individual  Related Interventions Conduct Problem-Solving Therapy (see Problem-Solving Therapy by Domenick Bookbinder and Rob Hickman) using techniques such as psychoeducation, modeling, and role-playing to teach client problem-solving skills (i.e., defining a problem specifically, generating  possible solutions, evaluating the pros and cons of each solution, selecting and implementing a plan of action, evaluating the efficacy of the plan, accepting or revising the plan); role-play application of the problem-solving skill to a real life issue (or assign "Applying Problem-Solving to Interpersonal Conflict" in the Adult Psychotherapy Homework Planner by Stephannie Li). Objective Learn and implement conflict resolution skills to resolve interpersonal problems. Target Date: 2023-11-05 Frequency: Biweekly  Progress: 0 Modality: individual  Related Interventions Teach conflict resolution skills (e.g., empathy, active listening, "I messages," respectful communication, assertiveness without aggression, compromise); use psychoeducation, modeling, role-playing, and rehearsal to work through several current conflicts; assign homework exercises; review and repeat so as to integrate their use into the client's life. Help the client resolve depression related to interpersonal problems through the use of reassurance and support, clarification of cognitive and affective triggers that ignite conflicts, and active problem-solving (or assign "Applying Problem-Solving to Interpersonal Conflict" in the Adult Psychotherapy Homework Planner by Stephannie Li). Objective Verbalize an understanding of healthy and unhealthy emotions with the intent of increasing the use of healthy emotions to guide actions. Target Date: 2023-11-05 Frequency: Biweekly  Progress: 0 Modality: individual  Related Interventions Use a process-experiential approach consistent with Emotion-Focused Therapy to create a safe, nurturing environment in which the client can process emotions, learning to identify and regulate unhealthy feelings and to generate more adaptive ones that then guide actions (see Emotion-Focused Therapy for Depression by Peter Minium). 2. Develop healthy thinking patterns and beliefs about self, others, and the world that  lead to the alleviation and help prevent the relapse of depression. 3. Develop the necessary skills for effective, open communication, mutually satisfying sexual intimacy, and enjoyable time for companionship within the relationship. 4. Elevate self-esteem. Objective Increase insight into the historical and current sources of low self-esteem. Target Date: 2023-11-05 Frequency: Biweekly  Progress: 0 Modality: individual  Related Interventions Help the client become aware of his/her fear of rejection and its connection with past rejection or abandonment experiences; begin to contrast past experiences of pain with present experiences of acceptance and competence. Objective Identify and replace negative self-talk messages used to reinforce low self-esteem. Target Date: 2023-11-05 Frequency: Biweekly  Progress: 0 Modality: individual  Related Interventions Help the client identify his/her distorted, negative beliefs about self and the world and replace these messages with more realistic, affirmative messages (or assign "Journal and Replace Self-Defeating Thoughts" in the Adult Psychotherapy Homework Planner by [facility] or read What to Say When You Talk to Yourself by [employer]). Ask the client to complete and process self-esteem-building exercises from recommended self-help books (e.g., Ten Days to Self Esteem! by Lawerance Bach; The Self-Esteem Companion by Murvin Donning, [place], and [employer]; 10 Simple Solutions for [employer] by [employer]). Objective Identify and engage in activities that would improve self-image by being consistent with one's values. Target Date: 2023-11-05 Frequency: Biweekly  Progress: 0 Modality: individual  Related Interventions Help the client analyze his/her values and the congruence or incongruence between them and the client's daily activities. Identify and assign activities congruent with the client's values; process them toward improving self-concept and  self-esteem. Objective Increase the frequency of assertive behaviors. Target Date: 2023-11-05 Frequency: Biweekly  Progress: 0 Modality: individual  Related Interventions Train the client in assertiveness or refer him/her to a group that will educate and facilitate assertiveness skills via lectures and assignments. Objective Demonstrate an increased ability to identify and express personal feelings. Target Date: 2023-11-05 Frequency: Biweekly  Progress: 0 Modality: individual  Related Interventions Assign the client to keep a journal of feelings on a daily basis. Objective Articulate a plan to be proactive in trying to get identified needs met. Target Date: 2023-11-05 Frequency: Biweekly  Progress: 0 Modality: individual  Related Interventions Assist the client in identifying and verbalizing his/her needs, met and unmet. Assist the client in developing a specific action plan to get each need met (or assign "Satisfying Unmet Emotional Needs" in the Adult Psychotherapy Homework Planner by [facility]). Objective Decrease the frequency of negative self-descriptive statements and increase frequency of positive self-descriptive statements. Target Date: 2023-11-05 Frequency: Biweekly  Progress: 0 Modality: individual  Related Interventions Assist the client in becoming aware of how he/she expresses or acts out negative feelings about himself/herself. Help the client reframe his/her negative assessment of himself/herself. 5. Establish an inward sense of self-worth, confidence, and competence. 6. Increase awareness of own role in the relationship conflicts. Objective Identify problems and strengths in the relationship, including one's own role in each. Target Date: 2023-11-05 Frequency: Biweekly  Progress: 0 Modality: individual  Related Interventions Assess current, ongoing problems in the relationship, including possible abuse/neglect, substance use, communication, conflict resolution, as  well as home environment (if domestic violence is present, plan for safety and avoid early use of conjoint sessions; see the Physical Abuse chapter in The Couples Psychotherapy Treatment Planner by Genoveva Ill, and Jongsma). Assess strengths in the relationship that could be enhanced during the therapy to facilitate the accomplishment of therapeutic goals. Objective Make a commitment to change specific behaviors that have been identified by self or the partner. Target Date: 2023-11-05 Frequency: Biweekly  Progress: 0 Modality: individual  Related Interventions Process the list of positive and problematic features of each partner and the relationship; ask couple to agree to work on changes he/she needs to make to improve the relationship, generating a list of targeted changes (  or assign "How Can We Meet Each Other's Needs and Desires?" in the Adult Psychotherapy Homework Planner by [facility]). Objective Increase the frequency of the direct expression of honest, respectful, and positive feelings and thoughts within the relationship. Target Date: 2023-11-05 Frequency: Biweekly  Progress: 0 Modality: individual  Related Interventions Assist the couple in identifying conflicts that can be addressed using communication, conflict-resolution, and/or problem-solving skills (see "Behavioral Marital Therapy" by Loyce Dys). Use behavioral techniques (education, modeling, role-playing, corrective feedback, and positive reinforcement) to teach communication skills including assertive communication, offering positive feedback, active listening, making positive requests of others for behavior change, and giving negative feedback in an honest and respectful manner. Objective Learn and implement problem-solving and conflict resolution skills. Target Date: 2023-11-05 Frequency: Biweekly  Progress: 0 Modality: individual  Related Interventions Review how newly learned communication skills can  be applied to conflict resolution through calm, respectful, effective dialogue; role-play application of this skill to a present conflict situation. Use behavioral techniques (education, modeling, role-playing, corrective feedback, and positive reinforcement) to teach the couple problem-solving and conflict resolution skills including defining the problem constructively and specifically, brainstorming options, evaluating options, compromise, choosing options and implementing a plan, evaluating the results. Objective Understand the origin of each other's negative emotions and reactions and develop more constructive interactions that fill needs. Target Date: 2023-11-05 Frequency: Biweekly  Progress: 0 Modality: individual  Related Interventions Encourage the clients to recognize, reframe, and express these insecurities toward resolving negative emotional and behavioral reactions. Assist the clients in developing more constructive interactions that satisfy attachment needs such as increased intimacy and expressions of love (or assign "How Can We Meet Each Other's Needs and Desires?" in the Adult Psychotherapy Homework Planner by Stephannie Li). Objective Increase time spent in enjoyable contact with the partner. Target Date: 2023-11-05 Frequency: Biweekly  Progress: 0 Modality: individual  Related Interventions Assist the couple in identifying and planning rewarding social/recreational activities that can be shared with the partner (or assign "Identify and Schedule Pleasant Activities" in the Adult Psychotherapy Homework Planner by Stephannie Li). Objective Identify any patterns of destructive and/or abusive behavior in the relationship. Target Date: 2023-11-05 Frequency: Biweekly  Progress: 0 Modality: individual  Related Interventions Assess current patterns of destructive and/or abusive behavior for each partner, including those that existed in each family of origin (if domestic violence is present, plan for  safety and avoid early use of conjoint sessions; see the Physical Abuse chapter in The Couples Psychotherapy Treatment Planner by Genoveva Ill, and Jongsma). 7. Increase job satisfaction and performance due to implementation of assertiveness and stress management strategies. 8. Increase sense of confidence and competence in dealing with work responsibilities. Objective Implement assertiveness skills. Target Date: 2023-11-05 Frequency: Biweekly  Progress: 0 Modality: individual  Related Interventions Train the client in assertiveness skills or refer to assertiveness training class that teaches effective communication of needs and feelings without aggression or defensiveness. Objective Learn and implement problem-solving skills. Target Date: 2023-11-05 Frequency: Biweekly  Progress: 0 Modality: individual  Related Interventions Conduct Problem-Solving Therapy (see Problem-Solving Therapy by Domenick Bookbinder and Rob Hickman) using techniques such as psychoeducation, modeling, and role-playing to teach the client problem-solving skills (i.e., defining a problem specifically, generating possible solutions, evaluating the pros and cons of each solution, selecting and implementing a plan of action, evaluating the efficacy of the plan, accepting or revising the plan); role-play application of the problem-solving skill to a real life issue (or assign "Applying Problem-Solving to Interpersonal Conflict" in the Adult Psychotherapy Homework Planner by Stephannie Li). Objective Verbalize  healthy, realistic cognitive messages that promote harmony with others, self-acceptance, and self-confidence. Target Date: 2023-11-05 Frequency: Biweekly  Progress: 0 Modality: individual  Related Interventions Teach the client the connection between thoughts, feelings, and behavior; train the client in the development of more realistic, healthy cognitive messages that relieve anxiety and depression. Objective Learn and implement calming  skills to reduce overall anxiety and manage anxiety symptoms. Target Date: 2023-11-05 Frequency: Biweekly  Progress: 0 Modality: individual  Related Interventions Teach the client calming/relaxation skills (e.g., applied relaxation, progressive muscle relaxation, cue controlled relaxation, mindful breathing, biofeedback) and how to discriminate better between relaxation and tension; teach the client how to apply these skills to his/her daily life (e.g., New Directions in Progressive Muscle Relaxation by Marcelyn Ditty, and Hazlett-Stevens; The Relaxation and Stress Reduction Workbook by Kevan Rosebush, and [place]). Objective Identify any personal problems that may be causing conflict in the employment setting. Target Date: 2023-11-05 Frequency: Biweekly  Progress: 0 Modality: individual  Related Interventions Explore the client's transfer of personal problems to the employment situation. Objective Identify the effect that vocational stress has on feelings toward self and relationships with significant others. Target Date: 2023-11-05 Frequency: Biweekly  Progress: 0 Modality: individual  Related Interventions Explore the effect of the client's vocational stress on his/her intra- and interpersonal dynamics with friends and family. Objective Develop and verbalize a plan for constructive action to reduce vocational stress. Target Date: 2023-11-05 Frequency: Biweekly  Progress: 0 Modality: individual  Related Interventions Assist the client in developing a plan to react positively to his/her vocational situation (or assign "My Vocational Action Plan" in the Adult Psychotherapy Homework Planner by [facility]); process the proactive plan and assist in its implementation.  Diagnosis:Attention deficit hyperactivity disorder (ADHD), combined type  Moderate episode of recurrent major depressive disorder (HCC)  Plan:  -patient will work on the work-life balance -next session will be on  Wednesday, December 01, 2022 at 1pm

## 2022-11-23 NOTE — Discharge Instructions (Signed)

## 2022-11-24 ENCOUNTER — Ambulatory Visit
Admission: RE | Admit: 2022-11-24 | Discharge: 2022-11-24 | Disposition: A | Discharge status: Home / Self Care | Payer: 59 | Source: Ambulatory Visit | Attending: Sports Medicine | Admitting: Sports Medicine

## 2022-11-24 DIAGNOSIS — M5030 Other cervical disc degeneration, unspecified cervical region: Secondary | ICD-10-CM

## 2022-11-24 DIAGNOSIS — M4722 Other spondylosis with radiculopathy, cervical region: Secondary | ICD-10-CM | POA: Diagnosis not present

## 2022-11-24 MED ORDER — IOPAMIDOL (ISOVUE-M 300) INJECTION 61%
1.0000 mL | Freq: Once | INTRAMUSCULAR | Status: AC | PRN
  Administered 2022-11-24: 1 mL via EPIDURAL

## 2022-11-24 MED ORDER — TRIAMCINOLONE ACETONIDE 40 MG/ML IJ SUSP (RADIOLOGY)
60.0000 mg | Freq: Once | INTRAMUSCULAR | Status: AC
  Administered 2022-11-24: 60 mg via EPIDURAL

## 2022-11-25 ENCOUNTER — Ambulatory Visit (INDEPENDENT_AMBULATORY_CARE_PROVIDER_SITE_OTHER): Payer: 59 | Admitting: Sports Medicine

## 2022-11-25 DIAGNOSIS — J111 Influenza due to unidentified influenza virus with other respiratory manifestations: Secondary | ICD-10-CM | POA: Diagnosis not present

## 2022-11-25 MED ORDER — OSELTAMIVIR PHOSPHATE 75 MG PO CAPS: 75.0000 mg | ORAL_CAPSULE | Freq: Two times a day (BID) | ORAL | 0 refills | Status: DC

## 2022-11-25 NOTE — Assessment & Plan Note (Signed)
1.5 days of malaise, positive flu A and B, adding Tamiflu.

## 2022-11-25 NOTE — Progress Notes (Signed)
    Procedures performed today:    None.  Independent interpretation of notes and tests performed by another provider:   None.  Brief History, Exam, Impression, and Recommendations:    Influenza A&B 1.5 days of malaise, positive flu A and B, adding Tamiflu.    ____________________________________________ April Webster. Benjamin Stain, M.D., ABFM., CAQSM., AME. 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

## 2022-12-01 ENCOUNTER — Encounter: Payer: Self-pay | Admitting: Professional

## 2022-12-01 ENCOUNTER — Ambulatory Visit (INDEPENDENT_AMBULATORY_CARE_PROVIDER_SITE_OTHER): Payer: 59 | Admitting: Professional

## 2022-12-01 DIAGNOSIS — F902 Attention-deficit hyperactivity disorder, combined type: Secondary | ICD-10-CM

## 2022-12-01 DIAGNOSIS — F331 Major depressive disorder, recurrent, moderate: Secondary | ICD-10-CM | POA: Diagnosis not present

## 2022-12-01 NOTE — Progress Notes (Signed)
[facility] Counselor/Therapist Progress Note  Patient ID: April Webster, MRN: [id number],    Date: 12/01/2022  Time Spent: 43 minutes 1-143pm  Treatment Type: Individual Therapy  Risk Assessment: Danger to Self:  No Self-injurious Behavior: No Danger to Others: No  Subjective:  This session was held via video teletherapy. The patient consented to video teletherapy and was located in her car during this session. She is aware it is the responsibility of the patient to secure confidentiality on her end of the session. The provider was in a private home office for the duration of this session.    The patient arrived on early for her Caregility session.  Issues addressed: 1-homework-completed -patient will work on the work-life balance 2-jury duty -pt is on break from jury duty at this moment 3-professional -pt spoke up about her concern for [name] to leadership -pt explained that she is doing triple the work -she was hear and they supported -[name] is now working in triage -she and her coworker are working in a much better groove -pt was done with notes for entire day by 515 -pt has been working very hard to have between work and home 3-physical -pt has had cervical epidural in space of neck due to chronic pain but nothing working -takes 3-4 days to start working and 4 weeks for full benefit -flu A/B positive not Covid -[facility] no longer allows time out for flu and it would go against your PAL -since she had not fever, no nausea and vomiting and several other symptoms -pt feels very tired and questions the reasons 4-boundaries -she has began implementing boundaries at work and home -at work, pt has seen positive outcomes as a result  -at home, her son has boundaries around completing homework -loss of video game time -if he lies additional time lost -pt struggles to set boundaries with patients -she sees patient as family and will overextend herself  -she sees  most of her patient as easily redirectable -some of her frustration lies between admin and clinical -too many hands in the pot making appointments -staff are not documenting purpose of visit clearly and cause pt to work more slowly at times, and at times it is okay because her pt's have few medications -providers have addressed in provider meeting monthly -they have addressed in huddles and there is no improvement   Treatment Plan Problems Addressed  Intimate Relationship Conflicts, Low Self-Esteem, Unipolar Depression, Vocational Stress  Goals 1. Alleviate depressive symptoms and return to previous level of effective functioning. Objective Identify and replace thoughts and beliefs that support depression. Target Date: 2023-11-05 Frequency: Biweekly  Progress: 0 Modality: individual  Related Interventions Conduct Cognitive-Behavioral Therapy (see Cognitive Behavior Therapy by Reola Calkins; Overcoming Depression by Agapito Games al.), beginning with helping the client learn the connection among cognition, depressive feelings, and actions. Assign the client to self-monitor thoughts, feelings, and actions in daily journal (e.g., "Negative Thoughts Trigger Negative Feelings" in the Adult Psychotherapy Homework Planner by Stephannie Li; "Daily Record of Dysfunctional Thoughts" in Cognitive Therapy of Depression by Ashby Dawes and Shea Evans); process the journal material to challenge depressive thinking patterns and replace them with reality-based thoughts. Assign "behavioral experiments" in which depressive automatic thoughts are treated as hypotheses/prediction, reality-based alternative hypotheses/prediction are generated, and both are tested against the client's past, present, and/or future experiences. Facilitate and reinforce the client's shift from biased depressive self-talk and beliefs to reality-based cognitive messages that enhance self-confidence and increase adaptive actions (see "Positive Self-Talk" in  the Adult Psychotherapy Homework Planner by Stephannie Li). Objective Learn and implement behavioral strategies to overcome depression. Target Date: 2023-11-05 Frequency: Biweekly  Progress: 0 Modality: individual  Related Interventions Engage the client in "behavioral activation," increasing his/her activity level and contact with sources of reward, while identifying processes that inhibit activation (see Behavioral Activation for Depression by Katharine Look, Dimidjian, and Herman-Dunn; or assign "Identify and Schedule Pleasant Activities" in the Adult Psychotherapy Homework Planner by [facility]); use behavioral techniques such as instruction, rehearsal, role-playing, role reversal, as needed, to facilitate activity in the client's daily life; reinforce success. Assist the client in developing skills that increase the likelihood of deriving pleasure from behavioral activation (e.g., assertiveness skills, developing an exercise plan, less internal/more external focus, increased social involvement); reinforce success. Objective Learn and implement problem-solving and decision-making skills. Target Date: 2023-11-05 Frequency: Biweekly  Progress: 0 Modality: individual  Related Interventions Conduct Problem-Solving Therapy (see Problem-Solving Therapy by Domenick Bookbinder and Rob Hickman) using techniques such as psychoeducation, modeling, and role-playing to teach client problem-solving skills (i.e., defining a problem specifically, generating possible solutions, evaluating the pros and cons of each solution, selecting and implementing a plan of action, evaluating the efficacy of the plan, accepting or revising the plan); role-play application of the problem-solving skill to a real life issue (or assign "Applying Problem-Solving to Interpersonal Conflict" in the Adult Psychotherapy Homework Planner by Stephannie Li). Objective Learn and implement conflict resolution skills to resolve interpersonal problems. Target Date: 2023-11-05  Frequency: Biweekly  Progress: 0 Modality: individual  Related Interventions Teach conflict resolution skills (e.g., empathy, active listening, "I messages," respectful communication, assertiveness without aggression, compromise); use psychoeducation, modeling, role-playing, and rehearsal to work through several current conflicts; assign homework exercises; review and repeat so as to integrate their use into the client's life. Help the client resolve depression related to interpersonal problems through the use of reassurance and support, clarification of cognitive and affective triggers that ignite conflicts, and active problem-solving (or assign "Applying Problem-Solving to Interpersonal Conflict" in the Adult Psychotherapy Homework Planner by Stephannie Li). Objective Verbalize an understanding of healthy and unhealthy emotions with the intent of increasing the use of healthy emotions to guide actions. Target Date: 2023-11-05 Frequency: Biweekly  Progress: 0 Modality: individual  Related Interventions Use a process-experiential approach consistent with Emotion-Focused Therapy to create a safe, nurturing environment in which the client can process emotions, learning to identify and regulate unhealthy feelings and to generate more adaptive ones that then guide actions (see Emotion-Focused Therapy for Depression by Peter Minium). 2. Develop healthy thinking patterns and beliefs about self, others, and the world that lead to the alleviation and help prevent the relapse of depression. 3. Develop the necessary skills for effective, open communication, mutually satisfying sexual intimacy, and enjoyable time for companionship within the relationship. 4. Elevate self-esteem. Objective Increase insight into the historical and current sources of low self-esteem. Target Date: 2023-11-05 Frequency: Biweekly  Progress: 0 Modality: individual  Related Interventions Help the client become aware of his/her  fear of rejection and its connection with past rejection or abandonment experiences; begin to contrast past experiences of pain with present experiences of acceptance and competence. Objective Identify and replace negative self-talk messages used to reinforce low self-esteem. Target Date: 2023-11-05 Frequency: Biweekly  Progress: 0 Modality: individual  Related Interventions Help the client identify his/her distorted, negative beliefs about self and the world and replace these messages with more realistic, affirmative messages (or assign "Journal and Replace Self-Defeating Thoughts" in the Adult Psychotherapy Homework Planner by [facility] or read  What to Say When You Talk to Yourself by Helmstetter). Ask the client to complete and process self-esteem-building exercises from recommended self-help books (e.g., Ten Days to Self Esteem! by Lawerance Bach; The Self-Esteem Companion by Murvin Donning, [place], and [employer]; 10 Simple Solutions for [employer] by [employer]). Objective Identify and engage in activities that would improve self-image by being consistent with one's values. Target Date: 2023-11-05 Frequency: Biweekly  Progress: 0 Modality: individual  Related Interventions Help the client analyze his/her values and the congruence or incongruence between them and the client's daily activities. Identify and assign activities congruent with the client's values; process them toward improving self-concept and self-esteem. Objective Increase the frequency of assertive behaviors. Target Date: 2023-11-05 Frequency: Biweekly  Progress: 0 Modality: individual  Related Interventions Train the client in assertiveness or refer him/her to a group that will educate and facilitate assertiveness skills via lectures and assignments. Objective Demonstrate an increased ability to identify and express personal feelings. Target Date: 2023-11-05 Frequency: Biweekly  Progress: 0 Modality: individual  Related  Interventions Assign the client to keep a journal of feelings on a daily basis. Objective Articulate a plan to be proactive in trying to get identified needs met. Target Date: 2023-11-05 Frequency: Biweekly  Progress: 0 Modality: individual  Related Interventions Assist the client in identifying and verbalizing his/her needs, met and unmet. Assist the client in developing a specific action plan to get each need met (or assign "Satisfying Unmet Emotional Needs" in the Adult Psychotherapy Homework Planner by [facility]). Objective Decrease the frequency of negative self-descriptive statements and increase frequency of positive self-descriptive statements. Target Date: 2023-11-05 Frequency: Biweekly  Progress: 0 Modality: individual  Related Interventions Assist the client in becoming aware of how he/she expresses or acts out negative feelings about himself/herself. Help the client reframe his/her negative assessment of himself/herself. 5. Establish an inward sense of self-worth, confidence, and competence. 6. Increase awareness of own role in the relationship conflicts. Objective Identify problems and strengths in the relationship, including one's own role in each. Target Date: 2023-11-05 Frequency: Biweekly  Progress: 0 Modality: individual  Related Interventions Assess current, ongoing problems in the relationship, including possible abuse/neglect, substance use, communication, conflict resolution, as well as home environment (if domestic violence is present, plan for safety and avoid early use of conjoint sessions; see the Physical Abuse chapter in The Couples Psychotherapy Treatment Planner by Genoveva Ill, and Jongsma). Assess strengths in the relationship that could be enhanced during the therapy to facilitate the accomplishment of therapeutic goals. Objective Make a commitment to change specific behaviors that have been identified by self or the partner. Target Date: 2023-11-05  Frequency: Biweekly  Progress: 0 Modality: individual  Related Interventions Process the list of positive and problematic features of each partner and the relationship; ask couple to agree to work on changes he/she needs to make to improve the relationship, generating a list of targeted changes (or assign "How Can We Meet Each Other's Needs and Desires?" in the Adult Psychotherapy Homework Planner by Stephannie Li). Objective Increase the frequency of the direct expression of honest, respectful, and positive feelings and thoughts within the relationship. Target Date: 2023-11-05 Frequency: Biweekly  Progress: 0 Modality: individual  Related Interventions Assist the couple in identifying conflicts that can be addressed using communication, conflict-resolution, and/or problem-solving skills (see "Behavioral Marital Therapy" by Loyce Dys). Use behavioral techniques (education, modeling, role-playing, corrective feedback, and positive reinforcement) to teach communication skills including assertive communication, offering positive feedback, active listening, making positive requests of others for  behavior change, and giving negative feedback in an honest and respectful manner. Objective Learn and implement problem-solving and conflict resolution skills. Target Date: 2023-11-05 Frequency: Biweekly  Progress: 0 Modality: individual  Related Interventions Review how newly learned communication skills can be applied to conflict resolution through calm, respectful, effective dialogue; role-play application of this skill to a present conflict situation. Use behavioral techniques (education, modeling, role-playing, corrective feedback, and positive reinforcement) to teach the couple problem-solving and conflict resolution skills including defining the problem constructively and specifically, brainstorming options, evaluating options, compromise, choosing options and implementing a plan,  evaluating the results. Objective Understand the origin of each other's negative emotions and reactions and develop more constructive interactions that fill needs. Target Date: 2023-11-05 Frequency: Biweekly  Progress: 0 Modality: individual  Related Interventions Encourage the clients to recognize, reframe, and express these insecurities toward resolving negative emotional and behavioral reactions. Assist the clients in developing more constructive interactions that satisfy attachment needs such as increased intimacy and expressions of love (or assign "How Can We Meet Each Other's Needs and Desires?" in the Adult Psychotherapy Homework Planner by Stephannie Li). Objective Increase time spent in enjoyable contact with the partner. Target Date: 2023-11-05 Frequency: Biweekly  Progress: 0 Modality: individual  Related Interventions Assist the couple in identifying and planning rewarding social/recreational activities that can be shared with the partner (or assign "Identify and Schedule Pleasant Activities" in the Adult Psychotherapy Homework Planner by Stephannie Li). Objective Identify any patterns of destructive and/or abusive behavior in the relationship. Target Date: 2023-11-05 Frequency: Biweekly  Progress: 0 Modality: individual  Related Interventions Assess current patterns of destructive and/or abusive behavior for each partner, including those that existed in each family of origin (if domestic violence is present, plan for safety and avoid early use of conjoint sessions; see the Physical Abuse chapter in The Couples Psychotherapy Treatment Planner by Genoveva Ill, and Jongsma). 7. Increase job satisfaction and performance due to implementation of assertiveness and stress management strategies. 8. Increase sense of confidence and competence in dealing with work responsibilities. Objective Implement assertiveness skills. Target Date: 2023-11-05 Frequency: Biweekly  Progress: 0 Modality:  individual  Related Interventions Train the client in assertiveness skills or refer to assertiveness training class that teaches effective communication of needs and feelings without aggression or defensiveness. Objective Learn and implement problem-solving skills. Target Date: 2023-11-05 Frequency: Biweekly  Progress: 0 Modality: individual  Related Interventions Conduct Problem-Solving Therapy (see Problem-Solving Therapy by Domenick Bookbinder and Rob Hickman) using techniques such as psychoeducation, modeling, and role-playing to teach the client problem-solving skills (i.e., defining a problem specifically, generating possible solutions, evaluating the pros and cons of each solution, selecting and implementing a plan of action, evaluating the efficacy of the plan, accepting or revising the plan); role-play application of the problem-solving skill to a real life issue (or assign "Applying Problem-Solving to Interpersonal Conflict" in the Adult Psychotherapy Homework Planner by Stephannie Li). Objective Verbalize healthy, realistic cognitive messages that promote harmony with others, self-acceptance, and self-confidence. Target Date: 2023-11-05 Frequency: Biweekly  Progress: 0 Modality: individual  Related Interventions Teach the client the connection between thoughts, feelings, and behavior; train the client in the development of more realistic, healthy cognitive messages that relieve anxiety and depression. Objective Learn and implement calming skills to reduce overall anxiety and manage anxiety symptoms. Target Date: 2023-11-05 Frequency: Biweekly  Progress: 0 Modality: individual  Related Interventions Teach the client calming/relaxation skills (e.g., applied relaxation, progressive muscle relaxation, cue controlled relaxation, mindful breathing, biofeedback) and how to discriminate better between relaxation and  tension; teach the client how to apply these skills to his/her daily life (e.g., New Directions in  Progressive Muscle Relaxation by Marcelyn Ditty, and Hazlett-Stevens; The Relaxation and Stress Reduction Workbook by Kevan Rosebush, and [place]). Objective Identify any personal problems that may be causing conflict in the employment setting. Target Date: 2023-11-05 Frequency: Biweekly  Progress: 0 Modality: individual  Related Interventions Explore the client's transfer of personal problems to the employment situation. Objective Identify the effect that vocational stress has on feelings toward self and relationships with significant others. Target Date: 2023-11-05 Frequency: Biweekly  Progress: 0 Modality: individual  Related Interventions Explore the effect of the client's vocational stress on his/her intra- and interpersonal dynamics with friends and family. Objective Develop and verbalize a plan for constructive action to reduce vocational stress. Target Date: 2023-11-05 Frequency: Biweekly  Progress: 0 Modality: individual  Related Interventions Assist the client in developing a plan to react positively to his/her vocational situation (or assign "My Vocational Action Plan" in the Adult Psychotherapy Homework Planner by [facility]); process the proactive plan and assist in its implementation.  Diagnosis:Attention deficit hyperactivity disorder (ADHD), combined type  Moderate episode of recurrent major depressive disorder (HCC)  Plan:  -continue progressing with work-life balance and efficiency -try to stop apologizing -next session will be on Wednesday, December 01, 2022 at 1pm

## 2022-12-02 ENCOUNTER — Telehealth: Payer: Self-pay | Admitting: Family Medicine

## 2022-12-02 MED ORDER — MUPIROCIN 2 % EX OINT
TOPICAL_OINTMENT | Freq: Three times a day (TID) | CUTANEOUS | 0 refills | Status: DC
Start: 2022-12-02 — End: 2023-02-28
  Filled 2022-12-02: qty 22, 10d supply, fill #0

## 2022-12-02 NOTE — Telephone Encounter (Signed)
Has a bump on posterior scalp that comes and goes its tender red and has drained previously.  Starting to get a little crust on it.  Will send over mupirocin ointment to [facility].  Likely epidermal cyst.  May need full excision at some point.  Meds ordered this encounter  Medications   mupirocin ointment (BACTROBAN) 2 %    Sig: Apply topically 3 (three) times daily.    Dispense:  30 g    Refill:  0

## 2022-12-03 ENCOUNTER — Other Ambulatory Visit: Payer: Self-pay

## 2022-12-03 ENCOUNTER — Other Ambulatory Visit (HOSPITAL_COMMUNITY): Payer: Self-pay

## 2022-12-06 ENCOUNTER — Other Ambulatory Visit (HOSPITAL_COMMUNITY): Payer: Self-pay

## 2022-12-06 ENCOUNTER — Other Ambulatory Visit: Payer: Self-pay | Admitting: Sports Medicine

## 2022-12-06 ENCOUNTER — Other Ambulatory Visit: Payer: Self-pay

## 2022-12-06 DIAGNOSIS — M5030 Other cervical disc degeneration, unspecified cervical region: Secondary | ICD-10-CM

## 2022-12-06 MED ORDER — PREGABALIN 150 MG PO CAPS
150.0000 mg | ORAL_CAPSULE | Freq: Three times a day (TID) | ORAL | 3 refills | Status: DC
  Filled 2022-12-06 – 2022-12-09 (×2): qty 270, 90d supply, fill #0
  Filled 2023-04-24: qty 270, 90d supply, fill #1
  Filled 2023-09-11: qty 270, 90d supply, fill #2

## 2022-12-06 NOTE — Assessment & Plan Note (Signed)
47 year old female with known cervical DDD, worse C5-C6, facets for the most part looked okay, she had mostly right-sided neck, paracervical, parathoracic and periscapular discomfort, ultimately she had a cervical epidural and has partial relief, Lyrica at 150 twice daily to 3 times daily seems effective so we will refill this, we can revisit this at her leisure.

## 2022-12-09 ENCOUNTER — Other Ambulatory Visit: Payer: Self-pay

## 2022-12-15 ENCOUNTER — Ambulatory Visit: Payer: 59 | Admitting: Professional

## 2022-12-15 ENCOUNTER — Encounter: Payer: Self-pay | Admitting: Professional

## 2022-12-15 ENCOUNTER — Encounter: Payer: Self-pay | Admitting: Family Medicine

## 2022-12-15 ENCOUNTER — Ambulatory Visit (INDEPENDENT_AMBULATORY_CARE_PROVIDER_SITE_OTHER): Payer: 59 | Admitting: Family Medicine

## 2022-12-15 ENCOUNTER — Other Ambulatory Visit (HOSPITAL_BASED_OUTPATIENT_CLINIC_OR_DEPARTMENT_OTHER): Payer: Self-pay

## 2022-12-15 VITALS — BP 112/68 | HR 71 | Temp 98.5°F | Ht 63.0 in | Wt 126.4 lb

## 2022-12-15 DIAGNOSIS — F902 Attention-deficit hyperactivity disorder, combined type: Secondary | ICD-10-CM

## 2022-12-15 DIAGNOSIS — F330 Major depressive disorder, recurrent, mild: Secondary | ICD-10-CM | POA: Diagnosis not present

## 2022-12-15 DIAGNOSIS — F331 Major depressive disorder, recurrent, moderate: Secondary | ICD-10-CM | POA: Diagnosis not present

## 2022-12-15 DIAGNOSIS — E039 Hypothyroidism, unspecified: Secondary | ICD-10-CM | POA: Diagnosis not present

## 2022-12-15 DIAGNOSIS — K5909 Other constipation: Secondary | ICD-10-CM

## 2022-12-15 MED ORDER — LEVOTHYROXINE SODIUM 125 MCG PO TABS
125.0000 ug | ORAL_TABLET | Freq: Every day | ORAL | Status: DC
Start: 2022-12-15 — End: 2023-09-15

## 2022-12-15 MED ORDER — METHYLPHENIDATE HCL ER (CD) 50 MG PO CPCR
50.0000 mg | ORAL_CAPSULE | ORAL | 0 refills | Status: DC
  Filled 2022-12-15: qty 30, 30d supply, fill #0

## 2022-12-15 NOTE — Assessment & Plan Note (Addendum)
Will try inc dose of Concerta. Will change to Metadate Cd for cost as well.  50mg   F/U in 3-4 months.

## 2022-12-15 NOTE — Assessment & Plan Note (Signed)
Recheck levels 

## 2022-12-15 NOTE — Assessment & Plan Note (Signed)
Doing fairly well on Lexapro.  Still occ feels anxious and jittery but also fatigued.

## 2022-12-15 NOTE — Progress Notes (Signed)
Established Patient Office Visit  Subjective   Patient ID: April POLLINGER, female    DOB: [birth year]  Age: 47 y.o. MRN: [id number]  Chief Complaint  Patient presents with   ADHD    Follow up visit   Constipation    Continuing constipation - requesting a new medication   Hypothyroidism    Requesting thyroid level recheck     HPI  ADHD - Reports symptoms are well controlled on current regime. Denies any problems with insomnia, chest pain, palpitations, or SOB.    F/U chronic constipation - she has tried Linzess, Amitiza, and now Trulance. They all have worked for a short period of time but then quit working. Even now on Trulance and having to take 2 Senakot a day and then Dulcolax on the weekends.  Still struggling.    Hypothyroid - due to recheck TSH after last adjustment/ Synthroid 112 mcg 5 days per week Synthroid 125 mcg 2 days per week.    Due to recheck levels.    Back on Lyrica 150mg  BID. Having some left shoulder pain. On lifting restrictions right now.       ROS    Objective:     BP 112/68   Pulse 71   Temp 98.5 F (36.9 C)   Ht 5\' 3"  (1.6 m)   Wt 126 lb 6 oz (57.3 kg)   SpO2 99%   BMI 22.39 kg/m    Physical Exam Vitals and nursing note reviewed.  Constitutional:      Appearance: Normal appearance.  HENT:     Head: Normocephalic and atraumatic.  Eyes:     Conjunctiva/sclera: Conjunctivae normal.  Cardiovascular:     Rate and Rhythm: Normal rate and regular rhythm.  Pulmonary:     Effort: Pulmonary effort is normal.     Breath sounds: Normal breath sounds.  Skin:    General: Skin is warm and dry.  Neurological:     Mental Status: She is alert.  Psychiatric:        Mood and Affect: Mood normal.      No results found for any visits on 12/15/22.    The 10-year ASCVD risk score (Arnett DK, et al., 2019) is: 0.5%    Assessment & Plan:   Problem List Items Addressed This Visit       Digestive   Chronic constipation    Suggested  rotating options every 90 days or when effect seems to dissipate.          Endocrine   Hypothyroidism    Recheck levels.       Relevant Medications   levothyroxine (SYNTHROID) 125 MCG tablet   Other Relevant Orders   TSH     Other   MDD (major depressive disorder), recurrent episode (HCC)    Doing fairly well on Lexapro.  Still occ feels anxious and jittery but also fatigued.       Attention deficit hyperactivity disorder (ADHD), combined type - Primary    Will try inc dose of Concerta. Will change to Metadate Cd for cost as well.  50mg   F/U in 3-4 months.        No follow-ups on file.    Nani Gasser, MD

## 2022-12-15 NOTE — Assessment & Plan Note (Signed)
Suggested rotating options every 90 days or when effect seems to dissipate.

## 2022-12-15 NOTE — Progress Notes (Signed)
[facility] Counselor/Therapist Progress Note  Patient ID: April Webster, MRN: [id number],    Date: 12/15/2022  Time Spent: 45 minutes 1-145pm  Treatment Type: Individual Therapy  Risk Assessment: Danger to Self:  No Self-injurious Behavior: No Danger to Others: No  Subjective:  This session was held via video teletherapy. The patient consented to video teletherapy and was located in her car during this session. She is aware it is the responsibility of the patient to secure confidentiality on her end of the session. The provider was in a private home office for the duration of this session.    The patient arrived on early for her Caregility session.  Issues addressed: 1-homework-completed a-continue progressing with work-life balance and efficiency b-try to stop apologizing 2-jury duty -likely was going to be a months long trial -case was against [facility] and a medically fragile child -pt went to court leader who did not release her but stayed all day long -she was called with three other people and provided  3-professional -a-Monday-Wednesday has been going better -did great yesterday and Monday, got to gym and exercised and was home before 6pm -she notices running behind on Thursdays and thinks it is result of half day on Wednesdays and the patient requests that come in on Wednesday evenings 3-physical -medication adjustment to stimulant -back on Lyrica on pain 4-self-care -getting organized and getting rid of clutter In bedroom and bathroom) -is going to the gym at least 5/wk, does not go home until she has gone to the gym -has completely stopped drinking effective 9/17 5-treatment plan -reviewed measurable objectives -pt identifies progress in all goals -self-esteem and managing marital conflict appear to be areas for growth  Treatment Plan Problems Addressed  Intimate Relationship Conflicts, Low Self-Esteem, Unipolar Depression, Vocational  Stress  Goals 1. Alleviate depressive symptoms and return to previous level of effective functioning. Objective Identify and replace thoughts and beliefs that support depression. Target Date: 2023-11-05 Frequency: Biweekly  Progress: 40 Modality: individual  Related Interventions Conduct Cognitive-Behavioral Therapy (see Cognitive Behavior Therapy by Reola Calkins; Overcoming Depression by Agapito Games al.), beginning with helping the client learn the connection among cognition, depressive feelings, and actions. Assign the client to self-monitor thoughts, feelings, and actions in daily journal (e.g., "Negative Thoughts Trigger Negative Feelings" in the Adult Psychotherapy Homework Planner by Stephannie Li; "Daily Record of Dysfunctional Thoughts" in Cognitive Therapy of Depression by Ashby Dawes and Shea Evans); process the journal material to challenge depressive thinking patterns and replace them with reality-based thoughts. Assign "behavioral experiments" in which depressive automatic thoughts are treated as hypotheses/prediction, reality-based alternative hypotheses/prediction are generated, and both are tested against the client's past, present, and/or future experiences. Facilitate and reinforce the client's shift from biased depressive self-talk and beliefs to reality-based cognitive messages that enhance self-confidence and increase adaptive actions (see "Positive Self-Talk" in the Adult Psychotherapy Homework Planner by Stephannie Li). Objective Learn and implement behavioral strategies to overcome depression. Target Date: 2023-11-05 Frequency: Biweekly  Progress: 60 Modality: individual  Related Interventions Engage the client in "behavioral activation," increasing his/her activity level and contact with sources of reward, while identifying processes that inhibit activation (see Behavioral Activation for Depression by Katharine Look, Dimidjian, and Herman-Dunn; or assign "Identify and Schedule Pleasant Activities" in  the Adult Psychotherapy Homework Planner by [facility]); use behavioral techniques such as instruction, rehearsal, role-playing, role reversal, as needed, to facilitate activity in the client's daily life; reinforce success. Assist the client in developing skills that increase the likelihood of deriving pleasure  from behavioral activation (e.g., assertiveness skills, developing an exercise plan, less internal/more external focus, increased social involvement); reinforce success. Objective Learn and implement problem-solving and decision-making skills. Target Date: 2023-11-05 Frequency: Biweekly  Progress: 60 Modality: individual  Related Interventions Conduct Problem-Solving Therapy (see Problem-Solving Therapy by Domenick Bookbinder and Rob Hickman) using techniques such as psychoeducation, modeling, and role-playing to teach client problem-solving skills (i.e., defining a problem specifically, generating possible solutions, evaluating the pros and cons of each solution, selecting and implementing a plan of action, evaluating the efficacy of the plan, accepting or revising the plan); role-play application of the problem-solving skill to a real life issue (or assign "Applying Problem-Solving to Interpersonal Conflict" in the Adult Psychotherapy Homework Planner by Stephannie Li). Objective Learn and implement conflict resolution skills to resolve interpersonal problems. Target Date: 2023-11-05 Frequency: Biweekly  Progress: 50 Modality: individual  Related Interventions Teach conflict resolution skills (e.g., empathy, active listening, "I messages," respectful communication, assertiveness without aggression, compromise); use psychoeducation, modeling, role-playing, and rehearsal to work through several current conflicts; assign homework exercises; review and repeat so as to integrate their use into the client's life. Help the client resolve depression related to interpersonal problems through the use of reassurance and support,  clarification of cognitive and affective triggers that ignite conflicts, and active problem-solving (or assign "Applying Problem-Solving to Interpersonal Conflict" in the Adult Psychotherapy Homework Planner by Stephannie Li). Objective Verbalize an understanding of healthy and unhealthy emotions with the intent of increasing the use of healthy emotions to guide actions. Target Date: 2023-11-05 Frequency: Biweekly  Progress: 50 Modality: individual  Related Interventions Use a process-experiential approach consistent with Emotion-Focused Therapy to create a safe, nurturing environment in which the client can process emotions, learning to identify and regulate unhealthy feelings and to generate more adaptive ones that then guide actions (see Emotion-Focused Therapy for Depression by Peter Minium). 2. Develop healthy thinking patterns and beliefs about self, others, and the world that lead to the alleviation and help prevent the relapse of depression. 3. Develop the necessary skills for effective, open communication, mutually satisfying sexual intimacy, and enjoyable time for companionship within the relationship. 4. Elevate self-esteem. Objective Increase insight into the historical and current sources of low self-esteem. Target Date: 2023-11-05 Frequency: Biweekly  Progress: 50 Modality: individual  Related Interventions Help the client become aware of his/her fear of rejection and its connection with past rejection or abandonment experiences; begin to contrast past experiences of pain with present experiences of acceptance and competence. Objective Identify and replace negative self-talk messages used to reinforce low self-esteem. Target Date: 2023-11-05 Frequency: Biweekly  Progress: 30 Modality: individual  Related Interventions Help the client identify his/her distorted, negative beliefs about self and the world and replace these messages with more realistic, affirmative messages (or  assign "Journal and Replace Self-Defeating Thoughts" in the Adult Psychotherapy Homework Planner by [facility] or read What to Say When You Talk to Yourself by Helmstetter). Ask the client to complete and process self-esteem-building exercises from recommended self-help books (e.g., Ten Days to Self Esteem! by Lawerance Bach; The Self-Esteem Companion by Murvin Donning, [place], and [employer]; 10 Simple Solutions for [employer] by [employer]). Objective Identify and engage in activities that would improve self-image by being consistent with one's values. Target Date: 2023-11-05 Frequency: Biweekly  Progress: 50 Modality: individual  Related Interventions Help the client analyze his/her values and the congruence or incongruence between them and the client's daily activities. Identify and assign activities congruent with the client's values; process them toward improving self-concept and self-esteem.  Objective Increase the frequency of assertive behaviors. Target Date: 2023-11-05 Frequency: Biweekly  Progress: 70 Modality: individual  Related Interventions Train the client in assertiveness or refer him/her to a group that will educate and facilitate assertiveness skills via lectures and assignments. Objective Demonstrate an increased ability to identify and express personal feelings. Target Date: 2023-11-05 Frequency: Biweekly  Progress: 60 Modality: individual  Related Interventions Assign the client to keep a journal of feelings on a daily basis. Objective Articulate a plan to be proactive in trying to get identified needs met. Target Date: 2023-11-05 Frequency: Biweekly  Progress: 60 Modality: individual  Related Interventions Assist the client in identifying and verbalizing his/her needs, met and unmet. Assist the client in developing a specific action plan to get each need met (or assign "Satisfying Unmet Emotional Needs" in the Adult Psychotherapy Homework Planner by  [facility]). Objective Decrease the frequency of negative self-descriptive statements and increase frequency of positive self-descriptive statements. Target Date: 2023-11-05 Frequency: Biweekly  Progress: 30 Modality: individual  Related Interventions Assist the client in becoming aware of how he/she expresses or acts out negative feelings about himself/herself. Help the client reframe his/her negative assessment of himself/herself. 5. Establish an inward sense of self-worth, confidence, and competence. 6. Increase awareness of own role in the relationship conflicts. Objective Identify problems and strengths in the relationship, including one's own role in each. Target Date: 2023-11-05 Frequency: Biweekly  Progress: 50 Modality: individual  Related Interventions Assess current, ongoing problems in the relationship, including possible abuse/neglect, substance use, communication, conflict resolution, as well as home environment (if domestic violence is present, plan for safety and avoid early use of conjoint sessions; see the Physical Abuse chapter in The Couples Psychotherapy Treatment Planner by Genoveva Ill, and Jongsma). Assess strengths in the relationship that could be enhanced during the therapy to facilitate the accomplishment of therapeutic goals. Objective Make a commitment to change specific behaviors that have been identified by self or the partner. Target Date: 2023-11-05 Frequency: Biweekly  Progress: 30 Modality: individual  Related Interventions Process the list of positive and problematic features of each partner and the relationship; ask couple to agree to work on changes he/she needs to make to improve the relationship, generating a list of targeted changes (or assign "How Can We Meet Each Other's Needs and Desires?" in the Adult Psychotherapy Homework Planner by Stephannie Li). Objective Increase the frequency of the direct expression of honest, respectful, and positive feelings  and thoughts within the relationship. Target Date: 2023-11-05 Frequency: Biweekly  Progress: 50 Modality: individual  Related Interventions Assist the couple in identifying conflicts that can be addressed using communication, conflict-resolution, and/or problem-solving skills (see "Behavioral Marital Therapy" by Loyce Dys). Use behavioral techniques (education, modeling, role-playing, corrective feedback, and positive reinforcement) to teach communication skills including assertive communication, offering positive feedback, active listening, making positive requests of others for behavior change, and giving negative feedback in an honest and respectful manner. Objective Learn and implement problem-solving and conflict resolution skills. Target Date: 2023-11-05 Frequency: Biweekly  Progress: 60 in general; 50 in relationship Modality: individual  Related Interventions Review how newly learned communication skills can be applied to conflict resolution through calm, respectful, effective dialogue; role-play application of this skill to a present conflict situation. Use behavioral techniques (education, modeling, role-playing, corrective feedback, and positive reinforcement) to teach the couple problem-solving and conflict resolution skills including defining the problem constructively and specifically, brainstorming options, evaluating options, compromise, choosing options and implementing a plan, evaluating the results. Objective Understand the origin  of each other's negative emotions and reactions and develop more constructive interactions that fill needs. Target Date: 2023-11-05 Frequency: Biweekly  Progress: 40 Modality: individual  Related Interventions Encourage the clients to recognize, reframe, and express these insecurities toward resolving negative emotional and behavioral reactions. Assist the clients in developing more constructive interactions that satisfy attachment  needs such as increased intimacy and expressions of love (or assign "How Can We Meet Each Other's Needs and Desires?" in the Adult Psychotherapy Homework Planner by Stephannie Li). Objective Increase time spent in enjoyable contact with the partner. Target Date: 2023-11-05 Frequency: Biweekly  Progress: 60 Modality: individual  Related Interventions Assist the couple in identifying and planning rewarding social/recreational activities that can be shared with the partner (or assign "Identify and Schedule Pleasant Activities" in the Adult Psychotherapy Homework Planner by Stephannie Li). Objective Identify any patterns of destructive and/or abusive behavior in the relationship. Target Date: 2023-11-05 Frequency: Biweekly  Progress: 50 Modality: individual  Related Interventions Assess current patterns of destructive and/or abusive behavior for each partner, including those that existed in each family of origin (if domestic violence is present, plan for safety and avoid early use of conjoint sessions; see the Physical Abuse chapter in The Couples Psychotherapy Treatment Planner by Genoveva Ill, and Jongsma). 7. Increase job satisfaction and performance due to implementation of assertiveness and stress management strategies. 8. Increase sense of confidence and competence in dealing with work responsibilities. Objective Implement assertiveness skills. Target Date: 2023-11-05 Frequency: Biweekly  Progress: 60 Modality: individual  Related Interventions Train the client in assertiveness skills or refer to assertiveness training class that teaches effective communication of needs and feelings without aggression or defensiveness. Objective Learn and implement problem-solving skills. Target Date: 2023-11-05 Frequency: Biweekly  Progress: 70 Modality: individual  Related Interventions Conduct Problem-Solving Therapy (see Problem-Solving Therapy by Domenick Bookbinder and Rob Hickman) using techniques such as psychoeducation,  modeling, and role-playing to teach the client problem-solving skills (i.e., defining a problem specifically, generating possible solutions, evaluating the pros and cons of each solution, selecting and implementing a plan of action, evaluating the efficacy of the plan, accepting or revising the plan); role-play application of the problem-solving skill to a real life issue (or assign "Applying Problem-Solving to Interpersonal Conflict" in the Adult Psychotherapy Homework Planner by Stephannie Li). Objective Verbalize healthy, realistic cognitive messages that promote harmony with others, self-acceptance, and self-confidence. Target Date: 2023-11-05 Frequency: Biweekly  Progress: 40 Modality: individual  Related Interventions Teach the client the connection between thoughts, feelings, and behavior; train the client in the development of more realistic, healthy cognitive messages that relieve anxiety and depression. Objective Learn and implement calming skills to reduce overall anxiety and manage anxiety symptoms. Target Date: 2023-11-05 Frequency: Biweekly  Progress: 60 Modality: individual  Related Interventions Teach the client calming/relaxation skills (e.g., applied relaxation, progressive muscle relaxation, cue controlled relaxation, mindful breathing, biofeedback) and how to discriminate better between relaxation and tension; teach the client how to apply these skills to his/her daily life (e.g., New Directions in Progressive Muscle Relaxation by Marcelyn Ditty, and Hazlett-Stevens; The Relaxation and Stress Reduction Workbook by Kevan Rosebush, and [place]). Objective Identify any personal problems that may be causing conflict in the employment setting. Target Date: 2023-11-05 Frequency: Biweekly  Progress: 50 Modality: individual  Related Interventions Explore the client's transfer of personal problems to the employment situation. Objective Identify the effect that vocational stress  has on feelings toward self and relationships with significant others. Target Date: 2023-11-05 Frequency: Biweekly  Progress: 70 Modality: individual  Related Interventions Explore  the effect of the client's vocational stress on his/her intra- and interpersonal dynamics with friends and family. Objective Develop and verbalize a plan for constructive action to reduce vocational stress. Target Date: 2023-11-05 Frequency: Biweekly  Progress: 50 Modality: individual  Related Interventions Assist the client in developing a plan to react positively to his/her vocational situation (or assign "My Vocational Action Plan" in the Adult Psychotherapy Homework Planner by [facility]); process the proactive plan and assist in its implementation.  Diagnosis:Moderate episode of recurrent major depressive disorder (HCC)  Mild episode of recurrent major depressive disorder (HCC)  Attention deficit hyperactivity disorder (ADHD), combined type  Plan:  -recommend Self-Love by Switch -next session will be on Wednesday, December 29, 2022 at 1pm

## 2022-12-16 DIAGNOSIS — E039 Hypothyroidism, unspecified: Secondary | ICD-10-CM | POA: Diagnosis not present

## 2022-12-17 LAB — TSH: TSH: 1.55 u[IU]/mL (ref 0.450–4.500)

## 2022-12-17 NOTE — Progress Notes (Signed)
Thyroid is perfect.

## 2022-12-19 ENCOUNTER — Other Ambulatory Visit: Payer: Self-pay | Admitting: Family Medicine

## 2022-12-19 ENCOUNTER — Other Ambulatory Visit (HOSPITAL_COMMUNITY): Payer: Self-pay

## 2022-12-20 ENCOUNTER — Other Ambulatory Visit: Payer: Self-pay

## 2022-12-20 MED ORDER — ESCITALOPRAM OXALATE 10 MG PO TABS
10.0000 mg | ORAL_TABLET | Freq: Every day | ORAL | 0 refills | Status: DC
  Filled 2022-12-20 – 2023-01-16 (×2): qty 90, 90d supply, fill #0

## 2022-12-21 ENCOUNTER — Encounter: Payer: Self-pay | Admitting: Sports Medicine

## 2022-12-21 ENCOUNTER — Other Ambulatory Visit (HOSPITAL_COMMUNITY): Payer: Self-pay

## 2022-12-21 ENCOUNTER — Other Ambulatory Visit (INDEPENDENT_AMBULATORY_CARE_PROVIDER_SITE_OTHER): Payer: 59

## 2022-12-21 ENCOUNTER — Ambulatory Visit (INDEPENDENT_AMBULATORY_CARE_PROVIDER_SITE_OTHER): Payer: 59 | Admitting: Sports Medicine

## 2022-12-21 DIAGNOSIS — G8929 Other chronic pain: Secondary | ICD-10-CM | POA: Insufficient documentation

## 2022-12-21 DIAGNOSIS — M7542 Impingement syndrome of left shoulder: Secondary | ICD-10-CM | POA: Diagnosis not present

## 2022-12-21 NOTE — Assessment & Plan Note (Signed)
Increasing pain left shoulder localized over the deltoid, positive impingement signs on exam, due to severe pain and failure of oral medications we did a subacromial injection, rotator cuff for the most part appeared intact, I did see some potential articular sided tearing of the infraspinatus. Adding home conditioning and return to see me in about 6 weeks. Cautioned on avoiding lifting greater than 5 pounds for the next 4+ days.

## 2022-12-21 NOTE — Progress Notes (Signed)
    Procedures performed today:    Procedure: Real-time Ultrasound Guided injection of the left subacromial bursa Device: Samsung HS60  Verbal informed consent obtained.  Time-out conducted.  Noted no overlying erythema, induration, or other signs of local infection.  Skin prepped in a sterile fashion.  Local anesthesia: Topical Ethyl chloride.  With sterile technique and under real time ultrasound guidance: Minimal bursitis noted, articular sided partial-thickness, partial with tearing of the infraspinatus noted, otherwise rotator cuff and biceps intact, 1 cc Kenalog 40, 1 cc lidocaine, 1 cc bupivacaine injected easily Completed without difficulty  Advised to call if fevers/chills, erythema, induration, drainage, or persistent bleeding.  Images permanently stored and available for review in PACS.  Impression: Technically successful ultrasound guided injection.  Independent interpretation of notes and tests performed by another provider:   None.  Brief History, Exam, Impression, and Recommendations:    Impingement syndrome, shoulder, left Increasing pain left shoulder localized over the deltoid, positive impingement signs on exam, due to severe pain and failure of oral medications we did a subacromial injection, rotator cuff for the most part appeared intact, I did see some potential articular sided tearing of the infraspinatus. Adding home conditioning and return to see me in about 6 weeks. Cautioned on avoiding lifting greater than 5 pounds for the next 4+ days.    ____________________________________________ Ihor Austin. Benjamin Stain, M.D., ABFM., CAQSM., AME. Primary Care and Sports Medicine [facility]  Adjunct Professor of Family Medicine  [place] [employer]  Restaurant manager, fast food

## 2022-12-22 DIAGNOSIS — M7542 Impingement syndrome of left shoulder: Secondary | ICD-10-CM | POA: Diagnosis not present

## 2022-12-22 MED ORDER — TRIAMCINOLONE ACETONIDE 40 MG/ML IJ SUSP
40.0000 mg | Freq: Once | INTRAMUSCULAR | Status: AC
Start: 2022-12-22 — End: 2022-12-22
  Administered 2022-12-22: 40 mg via INTRAMUSCULAR

## 2022-12-22 NOTE — Addendum Note (Signed)
Addended by: Carren Rang A on: 12/22/2022 10:38 AM   Modules accepted: Orders

## 2022-12-29 ENCOUNTER — Encounter: Payer: Self-pay | Admitting: Professional

## 2022-12-29 ENCOUNTER — Ambulatory Visit (INDEPENDENT_AMBULATORY_CARE_PROVIDER_SITE_OTHER): Payer: 59 | Admitting: Professional

## 2022-12-29 DIAGNOSIS — F331 Major depressive disorder, recurrent, moderate: Secondary | ICD-10-CM

## 2022-12-29 DIAGNOSIS — F902 Attention-deficit hyperactivity disorder, combined type: Secondary | ICD-10-CM | POA: Diagnosis not present

## 2022-12-29 DIAGNOSIS — F330 Major depressive disorder, recurrent, mild: Secondary | ICD-10-CM

## 2022-12-29 NOTE — Progress Notes (Signed)
[facility] Counselor/Therapist Progress Note  Patient ID: April Webster, MRN: [id number],    Date: 12/29/2022  Time Spent: 49 minutes 105-154pm  Treatment Type: Individual Therapy  Risk Assessment: Danger to Self:  No Self-injurious Behavior: No Danger to Others: No  Subjective:  This session was held via video teletherapy. The patient consented to video teletherapy and was located in her car during this session. She is aware it is the responsibility of the patient to secure confidentiality on her end of the session. The provider was in a private home office for the duration of this session.    The patient arrived early for her Caregility session.  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 Alleviate depressive symptoms and return to previous level of effective functioning. Objective Identify and replace  thoughts and beliefs that support depression. Target Date: 2023-11-05 Frequency: Biweekly  Progress: 40 Modality: individual  Related Interventions Conduct Cognitive-Behavioral Therapy (see Cognitive Behavior Therapy by Reola Calkins; Overcoming Depression by Agapito Games al.), beginning with helping the client learn the connection among cognition, depressive feelings, and actions. Assign the client to self-monitor thoughts, feelings, and actions in daily journal (e.g., "Negative Thoughts Trigger Negative Feelings" in the Adult Psychotherapy Homework Planner by Stephannie Li; "Daily Record of Dysfunctional Thoughts" in Cognitive Therapy of Depression by Ashby Dawes and Shea Evans); process the journal material to challenge depressive thinking patterns and replace them with reality-based thoughts. Assign "behavioral experiments" in which depressive automatic thoughts are treated as hypotheses/prediction, reality-based alternative hypotheses/prediction are generated, and both are tested against the client's past, present, and/or future experiences. Facilitate and reinforce the client's shift from biased depressive self-talk and beliefs to reality-based cognitive messages that enhance self-confidence and increase adaptive actions (see "Positive Self-Talk" in the Adult Psychotherapy Homework Planner by Stephannie Li). Objective Learn and implement behavioral strategies to overcome depression. Target Date: 2023-11-05 Frequency: Biweekly  Progress: 60 Modality: individual  Related Interventions Engage the client in "behavioral activation," increasing his/her activity level and contact with sources of reward, while identifying processes that inhibit activation (see Behavioral Activation for Depression by Katharine Look, Dimidjian, and Herman-Dunn; or assign "Identify and Schedule Pleasant Activities" in the Adult Psychotherapy Homework Planner by [facility]); use behavioral techniques such as instruction, rehearsal, role-playing, role  reversal, as needed, to facilitate activity in the client's daily life; reinforce success. Assist the client in developing skills that increase the likelihood of deriving pleasure from behavioral activation (e.g., assertiveness skills, developing an exercise plan,  less internal/more external focus, increased social involvement); reinforce success. Objective Learn and implement problem-solving and decision-making skills. Target Date: 2023-11-05 Frequency: Biweekly  Progress: 60 Modality: individual  Related Interventions Conduct Problem-Solving Therapy (see Problem-Solving Therapy by Domenick Bookbinder and Rob Hickman) using techniques such as psychoeducation, modeling, and role-playing to teach client problem-solving skills (i.e., defining a problem specifically, generating possible solutions, evaluating the pros and cons of each solution, selecting and implementing a plan of action, evaluating the efficacy of the plan, accepting or revising the plan); role-play application of the problem-solving skill to a real life issue (or assign "Applying Problem-Solving to Interpersonal Conflict" in the Adult Psychotherapy Homework Planner by Stephannie Li). Objective Learn and implement conflict resolution skills to resolve interpersonal problems. Target Date: 2023-11-05 Frequency: Biweekly  Progress: 50 Modality: individual  Related Interventions Teach conflict resolution skills (e.g., empathy, active listening, "I messages," respectful communication, assertiveness without aggression, compromise); use psychoeducation, modeling, role-playing, and rehearsal to work through several current conflicts; assign homework exercises; review and repeat so as to integrate their use into the client's life. Help the client resolve depression related to interpersonal problems through the use of reassurance and support, clarification of cognitive and affective triggers that ignite conflicts, and active problem-solving (or assign "Applying  Problem-Solving to Interpersonal Conflict" in the Adult Psychotherapy Homework Planner by Stephannie Li). Objective Verbalize an understanding of healthy and unhealthy emotions with the intent of increasing the use of healthy emotions to guide actions. Target Date: 2023-11-05 Frequency: Biweekly  Progress: 50 Modality: individual  Related Interventions Use a process-experiential approach consistent with Emotion-Focused Therapy to create a safe, nurturing environment in which the client can process emotions, learning to identify and regulate unhealthy feelings and to generate more adaptive ones that then guide actions (see Emotion-Focused Therapy for Depression by Peter Minium). 2. Develop healthy thinking patterns and beliefs about self, others, and the world that lead to the alleviation and help prevent the relapse of depression. 3. Develop the necessary skills for effective, open communication, mutually satisfying sexual intimacy, and enjoyable time for companionship within the relationship. 4. Elevate self-esteem. Objective Increase insight into the historical and current sources of low self-esteem. Target Date: 2023-11-05 Frequency: Biweekly  Progress: 50 Modality: individual  Related Interventions Help the client become aware of his/her fear of rejection and its connection with past rejection or abandonment experiences; begin to contrast past experiences of pain with present experiences of acceptance and competence. Objective Identify and replace negative self-talk messages used to reinforce low self-esteem. Target Date: 2023-11-05 Frequency: Biweekly  Progress: 30 Modality: individual  Related Interventions Help the client identify his/her distorted, negative beliefs about self and the world and replace these messages with more realistic, affirmative messages (or assign "Journal and Replace Self-Defeating Thoughts" in the Adult Psychotherapy Homework Planner by [facility] or read What to  Say When You Talk to Yourself by Helm[employer]). Ask the client to complete and process self-esteem-building exercises from recommended self-help books (e.g., Ten Days to Self Esteem! by Lawerance Bach; The Self-Esteem Companion by Murvin Donning, [place], and [employer]; 10 Simple Solutions for [employer] by [employer]). Objective Identify and engage in activities that would improve self-image by being consistent with one's values. Target Date: 2023-11-05 Frequency: Biweekly  Progress: 50 Modality: individual  Related Interventions Help the client analyze his/her values and the congruence or incongruence between them and the client's daily activities. Identify and assign activities congruent with the client's values; process them toward improving self-concept and self-esteem. Objective Increase the frequency of assertive behaviors. Target Date: 2023-11-05  Frequency: Biweekly  Progress: 70 Modality: individual  Related Interventions Train the client in assertiveness or refer him/her to a group that will educate and facilitate assertiveness skills via lectures and assignments. Objective Demonstrate an increased ability to identify and express personal feelings. Target Date: 2023-11-05 Frequency: Biweekly  Progress: 60 Modality: individual  Related Interventions Assign the client to keep a journal of feelings on a daily basis. Objective Articulate a plan to be proactive in trying to get identified needs met. Target Date: 2023-11-05 Frequency: Biweekly  Progress: 60 Modality: individual  Related Interventions Assist the client in identifying and verbalizing his/her needs, met and unmet. Assist the client in developing a specific action plan to get each need met (or assign "Satisfying Unmet Emotional Needs" in the Adult Psychotherapy Homework Planner by [facility]). Objective Decrease the frequency of negative self-descriptive statements and increase frequency of positive self-descriptive  statements. Target Date: 2023-11-05 Frequency: Biweekly  Progress: 30 Modality: individual  Related Interventions Assist the client in becoming aware of how he/she expresses or acts out negative feelings about himself/herself. Help the client reframe his/her negative assessment of himself/herself. 5. Establish an inward sense of self-worth, confidence, and competence. 6. Increase awareness of own role in the relationship conflicts. Objective Identify problems and strengths in the relationship, including one's own role in each. Target Date: 2023-11-05 Frequency: Biweekly  Progress: 50 Modality: individual  Related Interventions Assess current, ongoing problems in the relationship, including possible abuse/neglect, substance use, communication, conflict resolution, as well as home environment (if domestic violence is present, plan for safety and avoid early use of conjoint sessions; see the Physical Abuse chapter in The Couples Psychotherapy Treatment Planner by Genoveva Ill, and Jongsma). Assess strengths in the relationship that could be enhanced during the therapy to facilitate the accomplishment of therapeutic goals. Objective Make a commitment to change specific behaviors that have been identified by self or the partner. Target Date: 2023-11-05 Frequency: Biweekly  Progress: 30 Modality: individual  Related Interventions Process the list of positive and problematic features of each partner and the relationship; ask couple to agree to work on changes he/she needs to make to improve the relationship, generating a list of targeted changes (or assign "How Can We Meet Each Other's Needs and Desires?" in the Adult Psychotherapy Homework Planner by Stephannie Li). Objective Increase the frequency of the direct expression of honest, respectful, and positive feelings and thoughts within the relationship. Target Date: 2023-11-05 Frequency: Biweekly  Progress: 50 Modality: individual  Related  Interventions Assist the couple in identifying conflicts that can be addressed using communication, conflict-resolution, and/or problem-solving skills (see "Behavioral Marital Therapy" by Loyce Dys). Use behavioral techniques (education, modeling, role-playing, corrective feedback, and positive reinforcement) to teach communication skills including assertive communication, offering positive feedback, active listening, making positive requests of others for behavior change, and giving negative feedback in an honest and respectful manner. Objective Learn and implement problem-solving and conflict resolution skills. Target Date: 2023-11-05 Frequency: Biweekly  Progress: 60 in general; 50 in relationship Modality: individual  Related Interventions Review how newly learned communication skills can be applied to conflict resolution through calm, respectful, effective dialogue; role-play application of this skill to a present conflict situation. Use behavioral techniques (education, modeling, role-playing, corrective feedback, and positive reinforcement) to teach the couple problem-solving and conflict resolution skills including defining the problem constructively and specifically, brainstorming options, evaluating options, compromise, choosing options and implementing a plan, evaluating the results. Objective Understand the origin of each other's negative emotions and reactions and develop more  constructive interactions that fill needs. Target Date: 2023-11-05 Frequency: Biweekly  Progress: 40 Modality: individual  Related Interventions Encourage the clients to recognize, reframe, and express these insecurities toward resolving negative emotional and behavioral reactions. Assist the clients in developing more constructive interactions that satisfy attachment needs such as increased intimacy and expressions of love (or assign "How Can We Meet Each Other's Needs and Desires?" in the  Adult Psychotherapy Homework Planner by Stephannie Li). Objective Increase time spent in enjoyable contact with the partner. Target Date: 2023-11-05 Frequency: Biweekly  Progress: 60 Modality: individual  Related Interventions Assist the couple in identifying and planning rewarding social/recreational activities that can be shared with the partner (or assign "Identify and Schedule Pleasant Activities" in the Adult Psychotherapy Homework Planner by Stephannie Li). Objective Identify any patterns of destructive and/or abusive behavior in the relationship. Target Date: 2023-11-05 Frequency: Biweekly  Progress: 50 Modality: individual  Related Interventions Assess current patterns of destructive and/or abusive behavior for each partner, including those that existed in each family of origin (if domestic violence is present, plan for safety and avoid early use of conjoint sessions; see the Physical Abuse chapter in The Couples Psychotherapy Treatment Planner by Genoveva Ill, and Jongsma). 7. Increase job satisfaction and performance due to implementation of assertiveness and stress management strategies. 8. Increase sense of confidence and competence in dealing with work responsibilities. Objective Implement assertiveness skills. Target Date: 2023-11-05 Frequency: Biweekly  Progress: 60 Modality: individual  Related Interventions Train the client in assertiveness skills or refer to assertiveness training class that teaches effective communication of needs and feelings without aggression or defensiveness. Objective Learn and implement problem-solving skills. Target Date: 2023-11-05 Frequency: Biweekly  Progress: 70 Modality: individual  Related Interventions Conduct Problem-Solving Therapy (see Problem-Solving Therapy by Domenick Bookbinder and Rob Hickman) using techniques such as psychoeducation, modeling, and role-playing to teach the client problem-solving skills (i.e., defining a problem specifically, generating  possible solutions, evaluating the pros and cons of each solution, selecting and implementing a plan of action, evaluating the efficacy of the plan, accepting or revising the plan); role-play application of the problem-solving skill to a real life issue (or assign "Applying Problem-Solving to Interpersonal Conflict" in the Adult Psychotherapy Homework Planner by Stephannie Li). Objective Verbalize healthy, realistic cognitive messages that promote harmony with others, self-acceptance, and self-confidence. Target Date: 2023-11-05 Frequency: Biweekly  Progress: 40 Modality: individual  Related Interventions Teach the client the connection between thoughts, feelings, and behavior; train the client in the development of more realistic, healthy cognitive messages that relieve anxiety and depression. Objective Learn and implement calming skills to reduce overall anxiety and manage anxiety symptoms. Target Date: 2023-11-05 Frequency: Biweekly  Progress: 60 Modality: individual  Related Interventions Teach the client calming/relaxation skills (e.g., applied relaxation, progressive muscle relaxation, cue controlled relaxation, mindful breathing, biofeedback) and how to discriminate better between relaxation and tension; teach the client how to apply these skills to his/her daily life (e.g., New Directions in Progressive Muscle Relaxation by Marcelyn Ditty, and Hazlett-Stevens; The Relaxation and Stress Reduction Workbook by Kevan Rosebush, and [place]). Objective Identify any personal problems that may be causing conflict in the employment setting. Target Date: 2023-11-05 Frequency: Biweekly  Progress: 50 Modality: individual  Related Interventions Explore the client's transfer of personal problems to the employment situation. Objective Identify the effect that vocational stress has on feelings toward self and relationships with significant others. Target Date: 2023-11-05 Frequency: Biweekly   Progress: 70 Modality: individual  Related Interventions Explore the effect of the client's vocational stress on his/her intra-  and interpersonal dynamics with friends and family. Objective Develop and verbalize a plan for constructive action to reduce vocational stress. Target Date: 2023-11-05 Frequency: Biweekly  Progress: 50 Modality: individual  Related Interventions Assist the client in developing a plan to react positively to his/her vocational situation (or assign "My Vocational Action Plan" in the Adult Psychotherapy Homework Planner by [facility]); process the proactive plan and assist in its implementation.  Diagnosis:Moderate episode of recurrent major depressive disorder (HCC)  Mild episode of recurrent major depressive disorder (HCC)  Attention deficit hyperactivity disorder (ADHD), combined type  Plan:  -reevaluate financial -begin conversations regarding self-care -next session will be on Wednesday, January 12, 2023 at 1pm

## 2023-01-03 ENCOUNTER — Ambulatory Visit: Payer: 59 | Admitting: Cardiovascular Disease

## 2023-01-11 ENCOUNTER — Encounter: Payer: Self-pay | Admitting: Family Medicine

## 2023-01-11 ENCOUNTER — Ambulatory Visit (INDEPENDENT_AMBULATORY_CARE_PROVIDER_SITE_OTHER): Payer: 59 | Admitting: Family Medicine

## 2023-01-11 DIAGNOSIS — J0100 Acute maxillary sinusitis, unspecified: Secondary | ICD-10-CM | POA: Diagnosis not present

## 2023-01-11 MED ORDER — FLUCONAZOLE 150 MG PO TABS: 150.0000 mg | ORAL_TABLET | Freq: Once | ORAL | 0 refills | Status: AC

## 2023-01-11 MED ORDER — AZITHROMYCIN 250 MG PO TABS
ORAL_TABLET | ORAL | 0 refills | Status: AC
Start: 2023-01-11 — End: 2023-01-16

## 2023-01-11 NOTE — Progress Notes (Signed)
Acute Office Visit  Subjective:     Patient ID: April Webster, female    DOB: 1976/01/04, 47 y.o.   MRN: [id number]  No chief complaint on file.   HPI Patient is in today for sinus pain and pressure.  6 to 7 days ago she started to feel bad with some mild nasal congestion and fatigue.  On Friday, about 5 days ago she actually tested positive for flu though she has a history of testing positive for flu frequently.  Symptoms progressed.  Now she is having persistent pressure and pain particularly over the right maxillary sinus and some sharp intermittent pains in her right ear that actually started about a week ago.  She is feeling more tired and fatigued.  In fact she almost did not go into work today because she felt so bad.  ROS      Objective:    There were no vitals taken for this visit.   Physical Exam Constitutional:      Appearance: Normal appearance.  HENT:     Head: Normocephalic and atraumatic.     Right Ear: Tympanic membrane, ear canal and external ear normal. There is no impacted cerumen.     Left Ear: Tympanic membrane, ear canal and external ear normal. There is no impacted cerumen.     Nose: Nose normal.     Mouth/Throat:     Pharynx: Oropharynx is clear.  Eyes:     Conjunctiva/sclera: Conjunctivae normal.  Cardiovascular:     Rate and Rhythm: Normal rate and regular rhythm.  Pulmonary:     Effort: Pulmonary effort is normal.     Breath sounds: Normal breath sounds.  Musculoskeletal:     Cervical back: Neck supple. No tenderness.  Lymphadenopathy:     Cervical: No cervical adenopathy.  Skin:    General: Skin is warm and dry.  Neurological:     Mental Status: She is alert and oriented to person, place, and time.  Psychiatric:        Mood and Affect: Mood normal.     No results found for any visits on 01/11/23.      Assessment & Plan:   Problem List Items Addressed This Visit   None Visit Diagnoses     Acute non-recurrent maxillary sinusitis     -  Primary   Relevant Medications   azithromycin (ZITHROMAX) 250 MG tablet   fluconazole (DIFLUCAN) 150 MG tablet      Sinusitis-will treat with azithromycin.  Also sent over Diflucan.  Call and let us know if not improving over the next week.  Meds ordered this encounter  Medications   azithromycin (ZITHROMAX) 250 MG tablet    Sig: 2 Ttabs PO on Day 1, then one a day x 4 days.    Dispense:  6 tablet    Refill:  0   fluconazole (DIFLUCAN) 150 MG tablet    Sig: Take 1 tablet (150 mg total) by mouth once for 1 dose.    Dispense:  1 tablet    Refill:  0    No follow-ups on file.  Nani Gasser, MD

## 2023-01-12 ENCOUNTER — Ambulatory Visit (INDEPENDENT_AMBULATORY_CARE_PROVIDER_SITE_OTHER): Payer: 59 | Admitting: Professional

## 2023-01-12 ENCOUNTER — Other Ambulatory Visit (HOSPITAL_COMMUNITY): Payer: Self-pay

## 2023-01-12 ENCOUNTER — Other Ambulatory Visit: Payer: Self-pay

## 2023-01-12 ENCOUNTER — Encounter: Payer: Self-pay | Admitting: Professional

## 2023-01-12 DIAGNOSIS — F902 Attention-deficit hyperactivity disorder, combined type: Secondary | ICD-10-CM | POA: Diagnosis not present

## 2023-01-12 DIAGNOSIS — F331 Major depressive disorder, recurrent, moderate: Secondary | ICD-10-CM | POA: Diagnosis not present

## 2023-01-12 NOTE — Progress Notes (Signed)
[facility] Counselor/Therapist Progress Note  Patient ID: April Webster, MRN: [id number],    Date: 01/12/2023  Time Spent: 43 minutes 1-143pm  Treatment Type: Individual Therapy  Risk Assessment: Danger to Self:  No Self-injurious Behavior: No Danger to Others: No  Subjective:  This session was held via video teletherapy. The patient consented to video teletherapy and was located in her car during this session. She is aware it is the responsibility of the patient to secure confidentiality on her end of the session. The provider was in a private home office for the duration of this session.    The patient arrived on time for her Caregility session.  Issues addressed: 1-homework-completed -reevaluate financial -begin conversations regarding self-care -doing communal groceries shopping on Saturdays -pt's spouse told on himself by stating he needed to grocery shopping for the next two weeks. 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 a -last year she had Covid three times, and fly two times last year 4-mood -feeling overwhelmed -felt anxious driving to work today   -she felt trapped in her car given that people were front/back and side to side   -she has never had issues with driving   -when the other cars moved and she was "able to get out of that little pack" -she did not follow her schedule and was stuck in traffic -feeling overwhelmed in a lot of different areas   -pt notices she isolates when she feels overwhelmed   -pt spent a lot of her youth solving her own problems -feels frustrated that front desk folks are not asking people that are sick where masks -pt doesn't take time off to meet her own needs  Treatment Plan Problems Addressed  Intimate Relationship Conflicts, Low Self-Esteem, Unipolar Depression,  Vocational Stress  Goals 1. Alleviate depressive symptoms and return to previous level of effective functioning. Objective Identify and replace thoughts and beliefs that support depression. Target Date: 2023-11-05 Frequency: Biweekly  Progress: 40 Modality: individual  Related Interventions Conduct Cognitive-Behavioral Therapy (see Cognitive Behavior Therapy by Reola Calkins; Overcoming Depression by Agapito Games al.), beginning with helping the client learn the connection among cognition, depressive feelings, and actions. Assign the client to self-monitor thoughts, feelings, and actions in daily journal (e.g., "Negative Thoughts Trigger Negative Feelings" in the Adult Psychotherapy Homework Planner by Stephannie Li; "Daily Record of Dysfunctional Thoughts" in Cognitive Therapy of Depression by Ashby Dawes and Shea Evans); process the journal material to challenge depressive thinking patterns and replace them with reality-based thoughts. Assign "behavioral experiments" in which depressive automatic thoughts are treated as hypotheses/prediction, reality-based alternative hypotheses/prediction are generated, and both are tested against the client's past, present, and/or future experiences. Facilitate and reinforce the client's shift from biased depressive self-talk and beliefs to reality-based cognitive messages that enhance self-confidence and increase adaptive actions (see "Positive Self-Talk" in the Adult Psychotherapy Homework Planner by Stephannie Li). Objective Learn and implement behavioral strategies to overcome depression. Target Date: 2023-11-05 Frequency: Biweekly  Progress: 60 Modality: individual  Related Interventions Engage the client in "behavioral activation," increasing his/her activity level and contact with sources of reward, while identifying processes that inhibit activation (see Behavioral Activation for Depression by Katharine Look, Dimidjian, and Herman-Dunn; or assign "Identify and Schedule Pleasant  Activities" in the Adult Psychotherapy Homework Planner by [facility]); use behavioral techniques such as instruction, rehearsal, role-playing, role reversal, as needed, to  facilitate activity in the client's daily life; reinforce success. Assist the client in developing skills that increase the likelihood of deriving pleasure from behavioral activation (e.g., assertiveness skills, developing an exercise plan, less internal/more external focus, increased social involvement); reinforce success. Objective Learn and implement problem-solving and decision-making skills. Target Date: 2023-11-05 Frequency: Biweekly  Progress: 60 Modality: individual  Related Interventions Conduct Problem-Solving Therapy (see Problem-Solving Therapy by Domenick Bookbinder and Rob Hickman) using techniques such as psychoeducation, modeling, and role-playing to teach client problem-solving skills (i.e., defining a problem specifically, generating possible solutions, evaluating the pros and cons of each solution, selecting and implementing a plan of action, evaluating the efficacy of the plan, accepting or revising the plan); role-play application of the problem-solving skill to a real life issue (or assign "Applying Problem-Solving to Interpersonal Conflict" in the Adult Psychotherapy Homework Planner by Stephannie Li). Objective Learn and implement conflict resolution skills to resolve interpersonal problems. Target Date: 2023-11-05 Frequency: Biweekly  Progress: 50 Modality: individual  Related Interventions Teach conflict resolution skills (e.g., empathy, active listening, "I messages," respectful communication, assertiveness without aggression, compromise); use psychoeducation, modeling, role-playing, and rehearsal to work through several current conflicts; assign homework exercises; review and repeat so as to integrate their use into the client's life. Help the client resolve depression related to interpersonal problems through the use of  reassurance and support, clarification of cognitive and affective triggers that ignite conflicts, and active problem-solving (or assign "Applying Problem-Solving to Interpersonal Conflict" in the Adult Psychotherapy Homework Planner by Stephannie Li). Objective Verbalize an understanding of healthy and unhealthy emotions with the intent of increasing the use of healthy emotions to guide actions. Target Date: 2023-11-05 Frequency: Biweekly  Progress: 50 Modality: individual  Related Interventions Use a process-experiential approach consistent with Emotion-Focused Therapy to create a safe, nurturing environment in which the client can process emotions, learning to identify and regulate unhealthy feelings and to generate more adaptive ones that then guide actions (see Emotion-Focused Therapy for Depression by Peter Minium). 2. Develop healthy thinking patterns and beliefs about self, others, and the world that lead to the alleviation and help prevent the relapse of depression. 3. Develop the necessary skills for effective, open communication, mutually satisfying sexual intimacy, and enjoyable time for companionship within the relationship. 4. Elevate self-esteem. Objective Increase insight into the historical and current sources of low self-esteem. Target Date: 2023-11-05 Frequency: Biweekly  Progress: 50 Modality: individual  Related Interventions Help the client become aware of his/her fear of rejection and its connection with past rejection or abandonment experiences; begin to contrast past experiences of pain with present experiences of acceptance and competence. Objective Identify and replace negative self-talk messages used to reinforce low self-esteem. Target Date: 2023-11-05 Frequency: Biweekly  Progress: 30 Modality: individual  Related Interventions Help the client identify his/her distorted, negative beliefs about self and the world and replace these messages with more realistic,  affirmative messages (or assign "Journal and Replace Self-Defeating Thoughts" in the Adult Psychotherapy Homework Planner by [facility] or read What to Say When You Talk to Yourself by [employer]). Ask the client to complete and process self-esteem-building exercises from recommended self-help books (e.g., Ten Days to Self Esteem! by Lawerance Bach; The Self-Esteem Companion by Murvin Donning, [place], and [employer]; 10 Simple Solutions for [employer] by [employer]). Objective Identify and engage in activities that would improve self-image by being consistent with one's values. Target Date: 2023-11-05 Frequency: Biweekly  Progress: 50 Modality: individual  Related Interventions Help the client analyze his/her values and the congruence or incongruence between  them and the client's daily activities. Identify and assign activities congruent with the client's values; process them toward improving self-concept and self-esteem. Objective Increase the frequency of assertive behaviors. Target Date: 2023-11-05 Frequency: Biweekly  Progress: 70 Modality: individual  Related Interventions Train the client in assertiveness or refer him/her to a group that will educate and facilitate assertiveness skills via lectures and assignments. Objective Demonstrate an increased ability to identify and express personal feelings. Target Date: 2023-11-05 Frequency: Biweekly  Progress: 60 Modality: individual  Related Interventions Assign the client to keep a journal of feelings on a daily basis. Objective Articulate a plan to be proactive in trying to get identified needs met. Target Date: 2023-11-05 Frequency: Biweekly  Progress: 60 Modality: individual  Related Interventions Assist the client in identifying and verbalizing his/her needs, met and unmet. Assist the client in developing a specific action plan to get each need met (or assign "Satisfying Unmet Emotional Needs" in the Adult Psychotherapy Homework Planner  by [facility]). Objective Decrease the frequency of negative self-descriptive statements and increase frequency of positive self-descriptive statements. Target Date: 2023-11-05 Frequency: Biweekly  Progress: 30 Modality: individual  Related Interventions Assist the client in becoming aware of how he/she expresses or acts out negative feelings about himself/herself. Help the client reframe his/her negative assessment of himself/herself. 5. Establish an inward sense of self-worth, confidence, and competence. 6. Increase awareness of own role in the relationship conflicts. Objective Identify problems and strengths in the relationship, including one's own role in each. Target Date: 2023-11-05 Frequency: Biweekly  Progress: 50 Modality: individual  Related Interventions Assess current, ongoing problems in the relationship, including possible abuse/neglect, substance use, communication, conflict resolution, as well as home environment (if domestic violence is present, plan for safety and avoid early use of conjoint sessions; see the Physical Abuse chapter in The Couples Psychotherapy Treatment Planner by Genoveva Ill, and Jongsma). Assess strengths in the relationship that could be enhanced during the therapy to facilitate the accomplishment of therapeutic goals. Objective Make a commitment to change specific behaviors that have been identified by self or the partner. Target Date: 2023-11-05 Frequency: Biweekly  Progress: 30 Modality: individual  Related Interventions Process the list of positive and problematic features of each partner and the relationship; ask couple to agree to work on changes he/she needs to make to improve the relationship, generating a list of targeted changes (or assign "How Can We Meet Each Other's Needs and Desires?" in the Adult Psychotherapy Homework Planner by Stephannie Li). Objective Increase the frequency of the direct expression of honest, respectful, and positive  feelings and thoughts within the relationship. Target Date: 2023-11-05 Frequency: Biweekly  Progress: 50 Modality: individual  Related Interventions Assist the couple in identifying conflicts that can be addressed using communication, conflict-resolution, and/or problem-solving skills (see "Behavioral Marital Therapy" by Loyce Dys). Use behavioral techniques (education, modeling, role-playing, corrective feedback, and positive reinforcement) to teach communication skills including assertive communication, offering positive feedback, active listening, making positive requests of others for behavior change, and giving negative feedback in an honest and respectful manner. Objective Learn and implement problem-solving and conflict resolution skills. Target Date: 2023-11-05 Frequency: Biweekly  Progress: 60 in general; 50 in relationship Modality: individual  Related Interventions Review how newly learned communication skills can be applied to conflict resolution through calm, respectful, effective dialogue; role-play application of this skill to a present conflict situation. Use behavioral techniques (education, modeling, role-playing, corrective feedback, and positive reinforcement) to teach the couple problem-solving and conflict resolution skills including defining the  problem constructively and specifically, brainstorming options, evaluating options, compromise, choosing options and implementing a plan, evaluating the results. Objective Understand the origin of each other's negative emotions and reactions and develop more constructive interactions that fill needs. Target Date: 2023-11-05 Frequency: Biweekly  Progress: 40 Modality: individual  Related Interventions Encourage the clients to recognize, reframe, and express these insecurities toward resolving negative emotional and behavioral reactions. Assist the clients in developing more constructive interactions that satisfy  attachment needs such as increased intimacy and expressions of love (or assign "How Can We Meet Each Other's Needs and Desires?" in the Adult Psychotherapy Homework Planner by Stephannie Li). Objective Increase time spent in enjoyable contact with the partner. Target Date: 2023-11-05 Frequency: Biweekly  Progress: 60 Modality: individual  Related Interventions Assist the couple in identifying and planning rewarding social/recreational activities that can be shared with the partner (or assign "Identify and Schedule Pleasant Activities" in the Adult Psychotherapy Homework Planner by Stephannie Li). Objective Identify any patterns of destructive and/or abusive behavior in the relationship. Target Date: 2023-11-05 Frequency: Biweekly  Progress: 50 Modality: individual  Related Interventions Assess current patterns of destructive and/or abusive behavior for each partner, including those that existed in each family of origin (if domestic violence is present, plan for safety and avoid early use of conjoint sessions; see the Physical Abuse chapter in The Couples Psychotherapy Treatment Planner by Genoveva Ill, and Jongsma). 7. Increase job satisfaction and performance due to implementation of assertiveness and stress management strategies. 8. Increase sense of confidence and competence in dealing with work responsibilities. Objective Implement assertiveness skills. Target Date: 2023-11-05 Frequency: Biweekly  Progress: 60 Modality: individual  Related Interventions Train the client in assertiveness skills or refer to assertiveness training class that teaches effective communication of needs and feelings without aggression or defensiveness. Objective Learn and implement problem-solving skills. Target Date: 2023-11-05 Frequency: Biweekly  Progress: 70 Modality: individual  Related Interventions Conduct Problem-Solving Therapy (see Problem-Solving Therapy by Domenick Bookbinder and Rob Hickman) using techniques such as  psychoeducation, modeling, and role-playing to teach the client problem-solving skills (i.e., defining a problem specifically, generating possible solutions, evaluating the pros and cons of each solution, selecting and implementing a plan of action, evaluating the efficacy of the plan, accepting or revising the plan); role-play application of the problem-solving skill to a real life issue (or assign "Applying Problem-Solving to Interpersonal Conflict" in the Adult Psychotherapy Homework Planner by Stephannie Li). Objective Verbalize healthy, realistic cognitive messages that promote harmony with others, self-acceptance, and self-confidence. Target Date: 2023-11-05 Frequency: Biweekly  Progress: 40 Modality: individual  Related Interventions Teach the client the connection between thoughts, feelings, and behavior; train the client in the development of more realistic, healthy cognitive messages that relieve anxiety and depression. Objective Learn and implement calming skills to reduce overall anxiety and manage anxiety symptoms. Target Date: 2023-11-05 Frequency: Biweekly  Progress: 60 Modality: individual  Related Interventions Teach the client calming/relaxation skills (e.g., applied relaxation, progressive muscle relaxation, cue controlled relaxation, mindful breathing, biofeedback) and how to discriminate better between relaxation and tension; teach the client how to apply these skills to his/her daily life (e.g., New Directions in Progressive Muscle Relaxation by Marcelyn Ditty, and Hazlett-Stevens; The Relaxation and Stress Reduction Workbook by Kevan Rosebush, and Gilliam). Objective Identify any personal problems that may be causing conflict in the employment setting. Target Date: 2023-11-05 Frequency: Biweekly  Progress: 50 Modality: individual  Related Interventions Explore the client's transfer of personal problems to the employment situation. Objective Identify the effect that  vocational stress has on  feelings toward self and relationships with significant others. Target Date: 2023-11-05 Frequency: Biweekly  Progress: 70 Modality: individual  Related Interventions Explore the effect of the client's vocational stress on his/her intra- and interpersonal dynamics with friends and family. Objective Develop and verbalize a plan for constructive action to reduce vocational stress. Target Date: 2023-11-05 Frequency: Biweekly  Progress: 50 Modality: individual  Related Interventions Assist the client in developing a plan to react positively to his/her vocational situation (or assign "My Vocational Action Plan" in the Adult Psychotherapy Homework Planner by [facility]); process the proactive plan and assist in its implementation.  Diagnosis:Moderate episode of recurrent major depressive disorder (HCC)  Attention deficit hyperactivity disorder (ADHD), combined type  Plan:  -self-care -next session will be on Wednesday, January 26, 2023 at 1pm

## 2023-01-17 ENCOUNTER — Other Ambulatory Visit: Payer: Self-pay

## 2023-01-17 ENCOUNTER — Other Ambulatory Visit (HOSPITAL_COMMUNITY): Payer: Self-pay

## 2023-01-20 ENCOUNTER — Telehealth: Payer: Self-pay | Admitting: Family Medicine

## 2023-01-20 DIAGNOSIS — B999 Unspecified infectious disease: Secondary | ICD-10-CM | POA: Diagnosis not present

## 2023-01-20 NOTE — Telephone Encounter (Signed)
Recurrent infection. Tested pos for flu 4 x last year and Covid 2 x. Now has tested pos for flu twice this seasaon  Orders Placed This Encounter  Procedures   IgG, IgA, IgM   CBC with Differential/Platelet

## 2023-01-21 ENCOUNTER — Encounter: Payer: Self-pay | Admitting: Family Medicine

## 2023-01-21 DIAGNOSIS — B999 Unspecified infectious disease: Secondary | ICD-10-CM

## 2023-01-21 LAB — CBC WITH DIFFERENTIAL/PLATELET
Basophils Absolute: 0 10*3/uL (ref 0.0–0.2)
Basos: 1 %
EOS (ABSOLUTE): 0.1 10*3/uL (ref 0.0–0.4)
Eos: 2 %
Hematocrit: 44.7 % (ref 34.0–46.6)
Hemoglobin: 14.1 g/dL (ref 11.1–15.9)
Immature Grans (Abs): 0 10*3/uL (ref 0.0–0.1)
Immature Granulocytes: 0 %
Lymphocytes Absolute: 1.5 10*3/uL (ref 0.7–3.1)
Lymphs: 29 %
MCH: 32.3 pg (ref 26.6–33.0)
MCHC: 31.5 g/dL (ref 31.5–35.7)
MCV: 102 fL — ABNORMAL HIGH (ref 79–97)
Monocytes Absolute: 0.4 10*3/uL (ref 0.1–0.9)
Monocytes: 8 %
Neutrophils Absolute: 3.1 10*3/uL (ref 1.4–7.0)
Neutrophils: 60 %
Platelets: 230 10*3/uL (ref 150–450)
RBC: 4.37 x10E6/uL (ref 3.77–5.28)
RDW: 12.1 % (ref 11.7–15.4)
WBC: 5.1 10*3/uL (ref 3.4–10.8)

## 2023-01-21 LAB — IGG, IGA, IGM
IgA/Immunoglobulin A, Serum: 152 mg/dL (ref 87–352)
IgG (Immunoglobin G), Serum: 879 mg/dL (ref 586–1602)
IgM (Immunoglobulin M), Srm: 123 mg/dL (ref 26–217)

## 2023-01-21 NOTE — Progress Notes (Signed)
Hi April Webster, your MCV is up a little bit so we will call the lab and see if we can add a B12 and folate.  The IgG, IgA and IgM levels actually look pretty good.  Are you still interested in maybe seeing an immunologist?  PLease call lab and see if we can add a B12 and folate.

## 2023-01-23 ENCOUNTER — Other Ambulatory Visit: Payer: Self-pay | Admitting: Family Medicine

## 2023-01-24 ENCOUNTER — Other Ambulatory Visit (HOSPITAL_COMMUNITY): Payer: Self-pay

## 2023-01-24 MED ORDER — MELOXICAM 15 MG PO TABS
15.0000 mg | ORAL_TABLET | Freq: Every day | ORAL | 1 refills | Status: DC
  Filled 2023-01-24: qty 90, 90d supply, fill #0
  Filled 2023-04-22: qty 90, 90d supply, fill #1

## 2023-01-26 ENCOUNTER — Ambulatory Visit: Payer: 59 | Admitting: Professional

## 2023-01-26 ENCOUNTER — Encounter: Payer: Self-pay | Admitting: Professional

## 2023-01-26 DIAGNOSIS — F902 Attention-deficit hyperactivity disorder, combined type: Secondary | ICD-10-CM

## 2023-01-26 DIAGNOSIS — F331 Major depressive disorder, recurrent, moderate: Secondary | ICD-10-CM

## 2023-01-26 NOTE — Progress Notes (Signed)
[facility] Counselor/Therapist Progress Note  Patient ID: April Webster, MRN: [id number],    Date: 01/26/2023  Time Spent: 42 minutes 101-143pm  Treatment Type: Individual Therapy  Risk Assessment: Danger to Self:  No Self-injurious Behavior: No Danger to Others: No  Subjective:  This session was held via video teletherapy. The patient consented to video teletherapy and was located in her car during this session. She is aware it is the responsibility of the patient to secure confidentiality on her end of the session. The provider was in a private home office for the duration of this session.    The patient arrived on time for her Caregility session.  Issues addressed: 1-homework-completed -self-care -has been using music and frequencies to calm, crocheting. 2-professional a-missed one day last week -Dr. 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 did not communicate with patient b-working on prioritizing tasks -how to work smarter -issues with time management over past several weeks -was an hour behind one day last week   -sometimes pt gets too chatty   -sometimes she cannot cut off the pt -not getting weary 3-physical -pt has flu for the third time this season -she has been referred to infectious disease for after this month 4-[id number]) beginning soon and they will be managing some of the office responsibilities 5-marital -husband is still spending money out the [employer] but sells things to be new things   -husband will not pay a bill -she has challenged her spouse for having spent 33k in the past three years   Treatment Plan Problems Addressed  Intimate Relationship Conflicts, Low Self-Esteem, Unipolar Depression, Vocational Stress  Goals 1. Alleviate depressive symptoms and return to previous level of effective functioning. Objective Identify and replace thoughts and beliefs that support  depression. Target Date: 2023-11-05 Frequency: Biweekly  Progress: 40 Modality: individual  Related Interventions Conduct Cognitive-Behavioral Therapy (see Cognitive Behavior Therapy by Reola Calkins; Overcoming Depression by Agapito Games al.), beginning with helping the client learn the connection among cognition, depressive feelings, and actions. Assign the client to self-monitor thoughts, feelings, and actions in daily journal (e.g., "Negative Thoughts Trigger Negative Feelings" in the Adult Psychotherapy Homework Planner by Stephannie Li; "Daily Record of Dysfunctional Thoughts" in Cognitive Therapy of Depression by Ashby Dawes and Shea Evans); process the journal material to challenge depressive thinking patterns and replace them with reality-based thoughts. Assign "behavioral experiments" in which depressive automatic thoughts are treated as hypotheses/prediction, reality-based alternative hypotheses/prediction are generated, and both are tested against the client's past, present, and/or future experiences. Facilitate and reinforce the client's shift from biased depressive self-talk and beliefs to reality-based cognitive messages that enhance self-confidence and increase adaptive actions (see "Positive Self-Talk" in the Adult Psychotherapy Homework Planner by Stephannie Li). Objective Learn and implement behavioral strategies to overcome depression. Target Date: 2023-11-05 Frequency: Biweekly  Progress: 60 Modality: individual  Related Interventions Engage the client in "behavioral activation," increasing his/her activity level and contact with sources of reward, while identifying processes that inhibit activation (see Behavioral Activation for Depression by Katharine Look, Dimidjian, and Herman-Dunn; or assign "Identify and Schedule Pleasant Activities" in the Adult Psychotherapy Homework Planner by [facility]); use behavioral techniques such as instruction, rehearsal, role-playing, role reversal, as needed, to facilitate  activity in the client's daily life; reinforce success. Assist the client in developing skills that increase the likelihood of deriving pleasure from behavioral activation (e.g., assertiveness skills, developing an exercise plan, less internal/more external focus, increased social involvement); reinforce success. Objective Learn  and implement problem-solving and decision-making skills. Target Date: 2023-11-05 Frequency: Biweekly  Progress: 60 Modality: individual  Related Interventions Conduct Problem-Solving Therapy (see Problem-Solving Therapy by Domenick Bookbinder and Rob Hickman) using techniques such as psychoeducation, modeling, and role-playing to teach client problem-solving skills (i.e., defining a problem specifically, generating possible solutions, evaluating the pros and cons of each solution, selecting and implementing a plan of action, evaluating the efficacy of the plan, accepting or revising the plan); role-play application of the problem-solving skill to a real life issue (or assign "Applying Problem-Solving to Interpersonal Conflict" in the Adult Psychotherapy Homework Planner by Stephannie Li). Objective Learn and implement conflict resolution skills to resolve interpersonal problems. Target Date: 2023-11-05 Frequency: Biweekly  Progress: 50 Modality: individual  Related Interventions Teach conflict resolution skills (e.g., empathy, active listening, "I messages," respectful communication, assertiveness without aggression, compromise); use psychoeducation, modeling, role-playing, and rehearsal to work through several current conflicts; assign homework exercises; review and repeat so as to integrate their use into the client's life. Help the client resolve depression related to interpersonal problems through the use of reassurance and support, clarification of cognitive and affective triggers that ignite conflicts, and active problem-solving (or assign "Applying Problem-Solving to Interpersonal Conflict"  in the Adult Psychotherapy Homework Planner by Stephannie Li). Objective Verbalize an understanding of healthy and unhealthy emotions with the intent of increasing the use of healthy emotions to guide actions. Target Date: 2023-11-05 Frequency: Biweekly  Progress: 50 Modality: individual  Related Interventions Use a process-experiential approach consistent with Emotion-Focused Therapy to create a safe, nurturing environment in which the client can process emotions, learning to identify and regulate unhealthy feelings and to generate more adaptive ones that then guide actions (see Emotion-Focused Therapy for Depression by Peter Minium). 2. Develop healthy thinking patterns and beliefs about self, others, and the world that lead to the alleviation and help prevent the relapse of depression. 3. Develop the necessary skills for effective, open communication, mutually satisfying sexual intimacy, and enjoyable time for companionship within the relationship. 4. Elevate self-esteem. Objective Increase insight into the historical and current sources of low self-esteem. Target Date: 2023-11-05 Frequency: Biweekly  Progress: 50 Modality: individual  Related Interventions Help the client become aware of his/her fear of rejection and its connection with past rejection or abandonment experiences; begin to contrast past experiences of pain with present experiences of acceptance and competence. Objective Identify and replace negative self-talk messages used to reinforce low self-esteem. Target Date: 2023-11-05 Frequency: Biweekly  Progress: 30 Modality: individual  Related Interventions Help the client identify his/her distorted, negative beliefs about self and the world and replace these messages with more realistic, affirmative messages (or assign "Journal and Replace Self-Defeating Thoughts" in the Adult Psychotherapy Homework Planner by [facility] or read What to Say When You Talk to Yourself by  Helm[employer]etter). Ask the client to complete and process self-esteem-building exercises from recommended self-help books (e.g., Ten Days to Self Esteem! by Lawerance Bach; The Self-Esteem Companion by Murvin Donning, [place], and [employer]; 10 Simple Solutions for [employer] by [employer]). Objective Identify and engage in activities that would improve self-image by being consistent with one's values. Target Date: 2023-11-05 Frequency: Biweekly  Progress: 50 Modality: individual  Related Interventions Help the client analyze his/her values and the congruence or incongruence between them and the client's daily activities. Identify and assign activities congruent with the client's values; process them toward improving self-concept and self-esteem. Objective Increase the frequency of assertive behaviors. Target Date: 2023-11-05 Frequency: Biweekly  Progress: 70 Modality: individual  Related Interventions Train  the client in assertiveness or refer him/her to a group that will educate and facilitate assertiveness skills via lectures and assignments. Objective Demonstrate an increased ability to identify and express personal feelings. Target Date: 2023-11-05 Frequency: Biweekly  Progress: 60 Modality: individual  Related Interventions Assign the client to keep a journal of feelings on a daily basis. Objective Articulate a plan to be proactive in trying to get identified needs met. Target Date: 2023-11-05 Frequency: Biweekly  Progress: 60 Modality: individual  Related Interventions Assist the client in identifying and verbalizing his/her needs, met and unmet. Assist the client in developing a specific action plan to get each need met (or assign "Satisfying Unmet Emotional Needs" in the Adult Psychotherapy Homework Planner by [facility]). Objective Decrease the frequency of negative self-descriptive statements and increase frequency of positive self-descriptive statements. Target Date: 2023-11-05  Frequency: Biweekly  Progress: 30 Modality: individual  Related Interventions Assist the client in becoming aware of how he/she expresses or acts out negative feelings about himself/herself. Help the client reframe his/her negative assessment of himself/herself. 5. Establish an inward sense of self-worth, confidence, and competence. 6. Increase awareness of own role in the relationship conflicts. Objective Identify problems and strengths in the relationship, including one's own role in each. Target Date: 2023-11-05 Frequency: Biweekly  Progress: 50 Modality: individual  Related Interventions Assess current, ongoing problems in the relationship, including possible abuse/neglect, substance use, communication, conflict resolution, as well as home environment (if domestic violence is present, plan for safety and avoid early use of conjoint sessions; see the Physical Abuse chapter in The Couples Psychotherapy Treatment Planner by Genoveva Ill, and Jongsma). Assess strengths in the relationship that could be enhanced during the therapy to facilitate the accomplishment of therapeutic goals. Objective Make a commitment to change specific behaviors that have been identified by self or the partner. Target Date: 2023-11-05 Frequency: Biweekly  Progress: 30 Modality: individual  Related Interventions Process the list of positive and problematic features of each partner and the relationship; ask couple to agree to work on changes he/she needs to make to improve the relationship, generating a list of targeted changes (or assign "How Can We Meet Each Other's Needs and Desires?" in the Adult Psychotherapy Homework Planner by Stephannie Li). Objective Increase the frequency of the direct expression of honest, respectful, and positive feelings and thoughts within the relationship. Target Date: 2023-11-05 Frequency: Biweekly  Progress: 50 Modality: individual  Related Interventions Assist the couple in  identifying conflicts that can be addressed using communication, conflict-resolution, and/or problem-solving skills (see "Behavioral Marital Therapy" by Loyce Dys). Use behavioral techniques (education, modeling, role-playing, corrective feedback, and positive reinforcement) to teach communication skills including assertive communication, offering positive feedback, active listening, making positive requests of others for behavior change, and giving negative feedback in an honest and respectful manner. Objective Learn and implement problem-solving and conflict resolution skills. Target Date: 2023-11-05 Frequency: Biweekly  Progress: 60 in general; 50 in relationship Modality: individual  Related Interventions Review how newly learned communication skills can be applied to conflict resolution through calm, respectful, effective dialogue; role-play application of this skill to a present conflict situation. Use behavioral techniques (education, modeling, role-playing, corrective feedback, and positive reinforcement) to teach the couple problem-solving and conflict resolution skills including defining the problem constructively and specifically, brainstorming options, evaluating options, compromise, choosing options and implementing a plan, evaluating the results. Objective Understand the origin of each other's negative emotions and reactions and develop more constructive interactions that fill needs. Target Date: 2023-11-05 Frequency: Biweekly  Progress: 40 Modality: individual  Related Interventions Encourage the clients to recognize, reframe, and express these insecurities toward resolving negative emotional and behavioral reactions. Assist the clients in developing more constructive interactions that satisfy attachment needs such as increased intimacy and expressions of love (or assign "How Can We Meet Each Other's Needs and Desires?" in the Adult Psychotherapy Homework Planner by  Stephannie Li). Objective Increase time spent in enjoyable contact with the partner. Target Date: 2023-11-05 Frequency: Biweekly  Progress: 60 Modality: individual  Related Interventions Assist the couple in identifying and planning rewarding social/recreational activities that can be shared with the partner (or assign "Identify and Schedule Pleasant Activities" in the Adult Psychotherapy Homework Planner by Stephannie Li). Objective Identify any patterns of destructive and/or abusive behavior in the relationship. Target Date: 2023-11-05 Frequency: Biweekly  Progress: 50 Modality: individual  Related Interventions Assess current patterns of destructive and/or abusive behavior for each partner, including those that existed in each family of origin (if domestic violence is present, plan for safety and avoid early use of conjoint sessions; see the Physical Abuse chapter in The Couples Psychotherapy Treatment Planner by Genoveva Ill, and Jongsma). 7. Increase job satisfaction and performance due to implementation of assertiveness and stress management strategies. 8. Increase sense of confidence and competence in dealing with work responsibilities. Objective Implement assertiveness skills. Target Date: 2023-11-05 Frequency: Biweekly  Progress: 60 Modality: individual  Related Interventions Train the client in assertiveness skills or refer to assertiveness training class that teaches effective communication of needs and feelings without aggression or defensiveness. Objective Learn and implement problem-solving skills. Target Date: 2023-11-05 Frequency: Biweekly  Progress: 70 Modality: individual  Related Interventions Conduct Problem-Solving Therapy (see Problem-Solving Therapy by Domenick Bookbinder and Rob Hickman) using techniques such as psychoeducation, modeling, and role-playing to teach the client problem-solving skills (i.e., defining a problem specifically, generating possible solutions, evaluating the pros and  cons of each solution, selecting and implementing a plan of action, evaluating the efficacy of the plan, accepting or revising the plan); role-play application of the problem-solving skill to a real life issue (or assign "Applying Problem-Solving to Interpersonal Conflict" in the Adult Psychotherapy Homework Planner by Stephannie Li). Objective Verbalize healthy, realistic cognitive messages that promote harmony with others, self-acceptance, and self-confidence. Target Date: 2023-11-05 Frequency: Biweekly  Progress: 40 Modality: individual  Related Interventions Teach the client the connection between thoughts, feelings, and behavior; train the client in the development of more realistic, healthy cognitive messages that relieve anxiety and depression. Objective Learn and implement calming skills to reduce overall anxiety and manage anxiety symptoms. Target Date: 2023-11-05 Frequency: Biweekly  Progress: 60 Modality: individual  Related Interventions Teach the client calming/relaxation skills (e.g., applied relaxation, progressive muscle relaxation, cue controlled relaxation, mindful breathing, biofeedback) and how to discriminate better between relaxation and tension; teach the client how to apply these skills to his/her daily life (e.g., New Directions in Progressive Muscle Relaxation by Marcelyn Ditty, and Hazlett-Stevens; The Relaxation and Stress Reduction Workbook by Kevan Rosebush, and [place]). Objective Identify any personal problems that may be causing conflict in the employment setting. Target Date: 2023-11-05 Frequency: Biweekly  Progress: 50 Modality: individual  Related Interventions Explore the client's transfer of personal problems to the employment situation. Objective Identify the effect that vocational stress has on feelings toward self and relationships with significant others. Target Date: 2023-11-05 Frequency: Biweekly  Progress: 70 Modality: individual  Related  Interventions Explore the effect of the client's vocational stress on his/her intra- and interpersonal dynamics with friends and family. Objective Develop and verbalize  a plan for constructive action to reduce vocational stress. Target Date: 2023-11-05 Frequency: Biweekly  Progress: 50 Modality: individual  Related Interventions Assist the client in developing a plan to react positively to his/her vocational situation (or assign "My Vocational Action Plan" in the Adult Psychotherapy Homework Planner by [facility]); process the proactive plan and assist in its implementation.  Diagnosis:Moderate episode of recurrent major depressive disorder (HCC)  Attention deficit hyperactivity disorder (ADHD), combined type  Plan:   -next session will be on Wednesday, February 09, 2023 at 1pm

## 2023-02-08 ENCOUNTER — Other Ambulatory Visit: Payer: Self-pay

## 2023-02-09 ENCOUNTER — Ambulatory Visit: Payer: 59 | Admitting: Professional

## 2023-02-09 ENCOUNTER — Encounter: Payer: Self-pay | Admitting: Internal Medicine

## 2023-02-09 ENCOUNTER — Other Ambulatory Visit: Payer: Self-pay

## 2023-02-09 ENCOUNTER — Ambulatory Visit (INDEPENDENT_AMBULATORY_CARE_PROVIDER_SITE_OTHER): Payer: 59 | Admitting: Internal Medicine

## 2023-02-09 VITALS — BP 145/88 | HR 70 | Temp 97.9°F | Ht 63.0 in | Wt 135.0 lb

## 2023-02-09 DIAGNOSIS — Z114 Encounter for screening for human immunodeficiency virus [HIV]: Secondary | ICD-10-CM | POA: Diagnosis not present

## 2023-02-09 NOTE — Progress Notes (Signed)
[facility]      Reason for Consult:recurrent positive test for flu    Referring Physician: Dr. Linford Arnold    Patient ID: April Webster, female    DOB: Jun 12, 1975, 47 y.o.   MRN: [id number]  HPI:   April Webster is here for evaluation of a positive flu test multiple times.   She was sick last July with uri-like symptoms and was swabbed positive for COVID, influenza A and B and recovered.  Since then she has had recurrent symptoms of fatigue, headache and some sore throat and has been persistently swabbed positive for influenza A, B or both.  Swabs are rapid tests, no PCR.  No associated fever which she reports is typical for her.  She does not report a temperature of 101 or higher and has chronic aches so unable to determine if she has myalgias during the episodes.  Testing done in the low season and high season.    Past Medical History:  Diagnosis Date   Chronic idiopathic constipation    DDD (degenerative disc disease), cervical and lumbar    Female pattern hair loss    Graves disease    Hyperlipidemia    Hypertension    Hypothyroidism    Lichen sclerosus    Raynaud's disease     Prior to Admission medications   Medication Sig Start Date End Date Taking? Authorizing Provider  albuterol (VENTOLIN HFA) 108 (90 Base) MCG/ACT inhaler Inhale 2 puffs into the lungs every 6 (six) hours as needed for wheezing or shortness of breath. 06/10/21   Agapito Games, MD  amLODipine (NORVASC) 2.5 MG tablet Take 1 tablet (2.5 mg total) by mouth daily. 07/20/22   Agapito Games, MD  atorvastatin (LIPITOR) 20 MG tablet Take 1 tablet (20 mg total) by mouth daily. 05/10/22 05/10/23  Agapito Games, MD  escitalopram (LEXAPRO) 10 MG tablet Take 1 tablet (10 mg total) by mouth daily. 12/20/22   Agapito Games, MD  finasteride (PROSCAR) 5 MG tablet Take 0.5 tablets (2.5 mg total) by mouth daily. 05/10/22   Agapito Games, MD  levothyroxine (SYNTHROID) 125 MCG  tablet Take 1 tablet (125 mcg total) by mouth daily before breakfast. 2 days per week. 12/15/22   Agapito Games, MD  meloxicam (MOBIC) 15 MG tablet Take 1 tablet (15 mg total) by mouth daily. 01/24/23   Agapito Games, MD  methylphenidate (METADATE CD) 50 MG CR capsule Take 1 capsule (50 mg total) by mouth every morning. 12/15/22   Agapito Games, MD  Multiple Vitamins-Minerals (MULTIVITAMIN WITH MINERALS) tablet Take 1 tablet by mouth daily.    [provider]  mupirocin ointment (BACTROBAN) 2 % Apply topically 3 (three) times daily. 12/02/22   Agapito Games, MD  ondansetron (ZOFRAN) 4 MG tablet Take 1 tablet (4 mg total) by mouth every 8 (eight) hours as needed for nausea or vomiting. 06/25/22   Agapito Games, MD  pantoprazole (PROTONIX) 20 MG tablet Take 1 tablet (20 mg total) by mouth daily. 02/26/22   Agapito Games, MD  Plecanatide (TRULANCE) 3 MG TABS Take 1 tablet (3 mg total) by mouth daily. 11/16/22   Cirigliano, Vito V, DO  pregabalin (LYRICA) 150 MG capsule Take 1 capsule (150 mg total) by mouth 3 (three) times daily. 12/06/22   Monica Becton, MD  sennosides-docusate sodium (SENOKOT-S) 8.6-50 MG tablet Take 1 tablet by mouth daily as needed for constipation.    [provider]  SYNTHROID 112 MCG tablet Take 1 tablet (112 mcg total) by mouth daily before breakfast. 04/28/22   Agapito Games, MD  tiZANidine (ZANAFLEX) 4 MG tablet Take 1 tablet (4 mg total) by mouth at bedtime. 09/22/22   Monica Becton, MD  traZODone (DESYREL) 50 MG tablet Take 0.5-2 tablets (25-100 mg total) by mouth at bedtime. 10/11/22   Agapito Games, MD    Allergies  Allergen Reactions   Tuberculin Purified Protein Derivative Rash    Erythema and itching without induration   Ambien [Zolpidem] Other (See Comments)    Sleep talking   Cymbalta [Duloxetine Hcl] Other (See Comments)    Urinary retention.    Fluoxetine Other (See  Comments)    Sexual dysfunction   Tape Dermatitis and Rash   Wound Dressing Adhesive Dermatitis and Rash    Social History   Tobacco Use   Smoking status: Former    Current packs/day: 0.00    Average packs/day: 2.0 packs/day for 5.0 years (10.0 ttl pk-yrs)    Types: Cigarettes    Start date: 03/16/1991    Quit date: 03/15/1996    Years since quitting: 26.9   Smokeless tobacco: Never  Vaping Use   Vaping status: Never Used  Substance Use Topics   Alcohol use: Yes    Alcohol/week: 2.0 standard drinks of alcohol    Types: 2 Glasses of wine per week    Comment: occ   Drug use: Never    Family History  Problem Relation Age of Onset   Hypertension Mother    Hyperlipidemia Mother    Osteoporosis Mother    Lung cancer Father    Heart attack Maternal Grandmother    Stomach cancer Maternal Grandfather    Colon cancer Neg Hx    Rectal cancer Neg Hx    Liver cancer Neg Hx    Esophageal cancer Neg Hx    Pancreatic cancer Neg Hx     Review of Systems  Constitutional: negative for fevers and chills All other systems reviewed and are negative    Constitutional: in no apparent distress There were no vitals filed for this visit. EYES: anicteric Respiratory: normal respiratory effort Musculoskeletal: no edema  Labs: Lab Results  Component Value Date   WBC 5.1 01/20/2023   HGB 14.1 01/20/2023   HCT 44.7 01/20/2023   MCV 102 (H) 01/20/2023   PLT 230 01/20/2023    Lab Results  Component Value Date   CREATININE 0.80 09/22/2022   BUN 20 09/22/2022   NA 141 09/22/2022   K 4.0 09/22/2022   CL 101 09/22/2022   CO2 27 09/22/2022    Lab Results  Component Value Date   ALT 53 (H) 09/22/2022   AST 44 (H) 09/22/2022   BILITOT 0.7 09/22/2022   INR 1.0 02/19/2021     Assessment: repeat positive influenza tests.  After discussion with her it is most consistent with repeat false positive tests.  I discussed that it is not typical to have both A and B positive and less likely with  COVID positive as well.  Also discussed that false positives are common with non-PCR rapid tests, particularly with a low pretest probability.  I recommend to not test without a fever, new myalgias or other flu-like symtpoms.     Plan: 1)  consider PCR testing for future concerns if diagnosis unclear Follow up as needed

## 2023-02-11 LAB — HIV ANTIBODY (ROUTINE TESTING W REFLEX): HIV 1&2 Ab, 4th Generation: NONREACTIVE

## 2023-02-15 ENCOUNTER — Telehealth: Payer: Self-pay | Admitting: Sports Medicine

## 2023-02-15 DIAGNOSIS — M7542 Impingement syndrome of left shoulder: Secondary | ICD-10-CM

## 2023-02-15 NOTE — Assessment & Plan Note (Signed)
Persistent shoulder pain in spite of PT, subacromial injection, [place] with potential BT split tear, ordering MRI.

## 2023-02-15 NOTE — Telephone Encounter (Signed)
Persistent shoulder pain in spite of PT, subacromial injection, [place] with potential BT split tear, ordering MRI.

## 2023-02-18 ENCOUNTER — Telehealth: Payer: Self-pay | Admitting: Sports Medicine

## 2023-02-18 MED ORDER — FLUCONAZOLE 150 MG PO TABS: 150.0000 mg | ORAL_TABLET | Freq: Once | ORAL | 3 refills | Status: AC

## 2023-02-18 MED ORDER — AMOXICILLIN-POT CLAVULANATE 875-125 MG PO TABS: 1.0000 | ORAL_TABLET | Freq: Two times a day (BID) | ORAL | 0 refills | Status: DC

## 2023-02-18 NOTE — Telephone Encounter (Signed)
ST, HA malaise, recent test po for strep/covid.  Adding augmentin, diflucan.

## 2023-02-21 ENCOUNTER — Other Ambulatory Visit: Payer: 59

## 2023-02-23 ENCOUNTER — Ambulatory Visit: Payer: 59 | Admitting: Professional

## 2023-02-26 ENCOUNTER — Other Ambulatory Visit: Payer: 59

## 2023-02-28 ENCOUNTER — Other Ambulatory Visit (HOSPITAL_BASED_OUTPATIENT_CLINIC_OR_DEPARTMENT_OTHER): Payer: Self-pay

## 2023-02-28 ENCOUNTER — Ambulatory Visit: Payer: 59

## 2023-02-28 ENCOUNTER — Other Ambulatory Visit: Payer: Self-pay | Admitting: Family Medicine

## 2023-02-28 DIAGNOSIS — M7542 Impingement syndrome of left shoulder: Secondary | ICD-10-CM | POA: Diagnosis not present

## 2023-02-28 DIAGNOSIS — E039 Hypothyroidism, unspecified: Secondary | ICD-10-CM

## 2023-02-28 DIAGNOSIS — R748 Abnormal levels of other serum enzymes: Secondary | ICD-10-CM

## 2023-02-28 MED ORDER — HYDROCHLOROTHIAZIDE 25 MG PO TABS
25.0000 mg | ORAL_TABLET | Freq: Every day | ORAL | 0 refills | Status: DC | PRN
  Filled 2023-02-28 – 2023-03-03 (×2): qty 90, 90d supply, fill #0

## 2023-02-28 NOTE — Progress Notes (Signed)
Meds ordered this encounter  Medications   hydrochlorothiazide (HYDRODIURIL) 25 MG tablet    Sig: Take 1 tablet (25 mg total) by mouth daily as needed.    Dispense:  90 tablet    Refill:  0   Also recently taking 125 mcg of Synthroid 2 days a week and 112 mcg 5 days a week.  Ran out of the 125 so switched to 100 mcg daily with an extra half a tab on Monday.  Plan to recheck TSH in 6 weeks.   Difference  of 30 mcg/week.  Orders Placed This Encounter  Procedures   TSH   CMP14+EGFR

## 2023-03-02 ENCOUNTER — Encounter: Payer: Self-pay | Admitting: Family Medicine

## 2023-03-02 ENCOUNTER — Encounter (HOSPITAL_BASED_OUTPATIENT_CLINIC_OR_DEPARTMENT_OTHER): Payer: Self-pay

## 2023-03-02 DIAGNOSIS — B999 Unspecified infectious disease: Secondary | ICD-10-CM | POA: Insufficient documentation

## 2023-03-03 ENCOUNTER — Encounter: Payer: Self-pay | Admitting: Obstetrics and Gynecology

## 2023-03-03 ENCOUNTER — Other Ambulatory Visit (HOSPITAL_COMMUNITY): Payer: Self-pay

## 2023-03-03 ENCOUNTER — Other Ambulatory Visit (HOSPITAL_BASED_OUTPATIENT_CLINIC_OR_DEPARTMENT_OTHER): Payer: Self-pay

## 2023-03-09 ENCOUNTER — Other Ambulatory Visit (HOSPITAL_COMMUNITY): Payer: Self-pay

## 2023-03-11 ENCOUNTER — Other Ambulatory Visit: Payer: Self-pay | Admitting: Sports Medicine

## 2023-03-11 DIAGNOSIS — M7542 Impingement syndrome of left shoulder: Secondary | ICD-10-CM

## 2023-03-15 ENCOUNTER — Other Ambulatory Visit (HOSPITAL_COMMUNITY): Payer: Self-pay

## 2023-03-15 ENCOUNTER — Telehealth: Payer: Self-pay | Admitting: Sports Medicine

## 2023-03-15 ENCOUNTER — Other Ambulatory Visit: Payer: Self-pay

## 2023-03-15 DIAGNOSIS — F330 Major depressive disorder, recurrent, mild: Secondary | ICD-10-CM

## 2023-03-15 MED ORDER — VENLAFAXINE HCL ER 37.5 MG PO CP24
37.5000 mg | ORAL_CAPSULE | Freq: Every day | ORAL | 0 refills | Status: DC
  Filled 2023-03-15: qty 3, 3d supply, fill #0

## 2023-03-15 MED ORDER — VENLAFAXINE HCL ER 75 MG PO CP24
75.0000 mg | ORAL_CAPSULE | Freq: Every day | ORAL | 1 refills | Status: DC
  Filled 2023-03-15: qty 30, 30d supply, fill #0
  Filled 2023-04-12: qty 30, 30d supply, fill #1

## 2023-03-15 NOTE — Telephone Encounter (Signed)
 In the spirit of continuing control of mood but also controlling pain we will switch from Lexapro to Effexor, patient did not have good response to Cymbalta, urinary retention. We can revisit this in about 6 weeks.

## 2023-03-22 NOTE — Therapy (Signed)
 OUTPATIENT PHYSICAL THERAPY SHOULDER EVALUATION   Patient Name: April Webster MRN: [id number] DOB:22-Nov-1975, 48 y.o., female Today's Date: 03/23/2023  END OF SESSION:  PT End of Session - 03/23/23 1403     Visit Number 1    Number of Visits 9    Date for PT Re-Evaluation 05/18/23    Authorization Type [employer] employee [employer]    PT Start Time 1403    PT Stop Time 1445    PT Time Calculation (min) 42 min             Past Medical History:  Diagnosis Date   Chronic idiopathic constipation    DDD (degenerative disc disease), cervical and lumbar    Female pattern hair loss    Graves disease    Hyperlipidemia    Hypertension    Hypothyroidism    Lichen sclerosus    Raynaud's disease    Past Surgical History:  Procedure Laterality Date   ABDOMINOPLASTY     AUGMENTATION MAMMAPLASTY Bilateral 05/2019   Silicone    CHOLECYSTECTOMY     DILITATION & CURRETTAGE/HYSTROSCOPY WITH NOVASURE ABLATION N/A 02/09/2022   Procedure: DILATATION & CURETTAGE/HYSTEROSCOPY WITH NOVASURE ABLATION, REMOVAL OF IUD;  Surgeon: Cleatus Moccasin, MD;  Location: [facility] OR;  Service: Gynecology;  Laterality: N/A;   PLACEMENT OF BREAST IMPLANTS     THYROID  SURGERY     TUBAL LIGATION     Patient Active Problem List   Diagnosis Date Noted   Recurrent infections 03/02/2023   Screening for HIV (human immunodeficiency virus) 02/09/2023   Impingement syndrome, shoulder, left 12/21/2022   Attention deficit hyperactivity disorder (ADHD), combined type 07/20/2022   Primary insomnia 09/09/2021   Chronic pain of right knee 08/12/2021   LGSIL on Pap smear of cervix 07/01/2020   Raynaud's disease 02/18/2020   IC (interstitial cystitis) 02/18/2020   Chronic constipation 02/18/2020   DDD (degenerative disc disease), cervical 12/14/2019   Gastroesophageal reflux disease 05/16/2019   Hypothyroidism 03/19/2019   Hyperlipidemia 03/19/2019   Facet syndrome, lumbar 03/19/2019   Menorrhagia 03/19/2019   Female  pattern hair loss 03/19/2019   Lichen sclerosus 03/19/2019   Upper airway cough syndrome 03/19/2019   IUD (intrauterine device) in place 12/06/2016   MDD (major depressive disorder), recurrent episode (HCC) 05/10/2011    PCP: Alvan Dorothyann BIRCH, MD  REFERRING PROVIDER: Curtis Debby PARAS, MD  REFERRING DIAG: [phone] (ICD-10-CM) - Impingement syndrome, shoulder, left  THERAPY DIAG:  Left shoulder pain, unspecified chronicity  Muscle weakness (generalized)  Rationale for Evaluation and Treatment: Rehabilitation  ONSET DATE: 3-4 months   SUBJECTIVE:  SUBJECTIVE STATEMENT: Pt endorses history of multiple illnesses over past couple of years which has affected her exercise routine. States she was beginning to exercise more, was trying to progress her overhead presses and felt an audible pull in her L shoulder. States that it was not painful but the next day felt sharp/stabbing pain in lateral/anterior shoulder. Trial of NSAIDs and home exercises without significant change. Pt states that she ended up getting steroid injection which gave her a few weeks of relief, but in early December was reaching forward, felt a sharp pain and has been exacerbated since. She notes difficulty with general movement of LUE but also endorses intermittent sharp pains while sitting unsupported. Difficulty sleeping as well, waking 1-2x/night on average. Does endorse some numbness in digits 4 and 5 - states this was occurring prior to shoulder pain but does seem more prominent now.  Endorses intermittent headaches. Denies any visual changes, swallowing/speech issues. No fevers. Endorses some night sweats she attributes to menopause. No BIL numbness aside from raynaud's history.  Hand dominance: Right  PERTINENT HISTORY: See above  PMH  PAIN:  Are you having pain: 3/10 Location/description: L lateral/anterior shoulder. Posterior at times. Sometimes referring distally into digits 4 and 5 Best-worst over past week: 0-9/10  - aggravating factors: movements, difficulty reaching behind back, seat belts, holding items - Easing factors: hot bath    PRECAUTIONS: None   WEIGHT BEARING RESTRICTIONS: No  FALLS:  Has patient fallen in last 6 months? Reports frequent near falls, reports 1 major fall back in April   LIVING ENVIRONMENT: 2 story house 1 STE, bed/bath upstairs, bathroom downstairs. 14 stairs to second floor Lives w/ husband, 2 kids, 4 dogs, 2 snakes  OCCUPATION: Nurse Practitioner  PLOF: Independent  PATIENT GOALS: be able to move arm better, get back to strength training   NEXT MD VISIT: TBD   OBJECTIVE:  Note: Objective measures were completed at Evaluation unless otherwise noted.  DIAGNOSTIC FINDINGS:  02/28/23 L shoulder MRI IMPRESSION: 1. Mild supraspinatus and infraspinatus tendinosis without a tear. 2.  No acute osseous injury of the left shoulder.  PATIENT SURVEYS:  FOTO 54 > 67   COGNITION: Overall cognitive status: Within functional limits for tasks assessed     SENSATION: Light touch intact B UE   POSTURE: Increased L UT elevation, rounded shoulders, tendency towards mildly guarded posture L UE  UPPER EXTREMITY ROM:  A/PROM Right eval Left eval  Shoulder flexion 162 150 deg   Shoulder abduction 170 105 deg *  Shoulder internal rotation (functional combo) T5 L3 *  Shoulder external rotation (functional combo) T2 C5 *  Elbow flexion    Elbow extension    Wrist flexion    Wrist extension     (Blank rows = not tested) (Key: WFL = within functional limits not formally assessed, * = concordant pain, s = stiffness/stretching sensation, NT = not tested)  Comments:    UPPER EXTREMITY MMT:  MMT Right eval Left eval  Shoulder flexion 5 4 *   Shoulder extension     Shoulder abduction 5 4+  Shoulder extension    Shoulder internal rotation 5 4 *  Shoulder external rotation 5 4  Elbow flexion 5 4+   Elbow extension 5 4+  Grip strength    (Blank rows = not tested)  (Key: WFL = within functional limits not formally assessed, * = concordant pain, s = stiffness/stretching sensation, NT = not tested)  Comments:   SHOULDER SPECIAL TESTS: Positive neers  on L   JOINT MOBILITY TESTING:  deferred  PALPATION:  Significant concordant TTP L bicipital groove, infraspinatus/teres. Mild tenderness L deltoid, rhomboid, UT/LS                                                                                                                             TREATMENT DATE:  [facility] Adult PT Treatment:                                                DATE: 03/23/23 Therapeutic Exercise: Isometric elbow flexion 5x5sec hold  Isometric shoulder flexion 5x5sec hold  HEP handout + education, cues/education on appropriate monitoring/modification as needed   PATIENT EDUCATION: Education details: Pt education on PT impairments, prognosis, and POC. Informed consent. Rationale for interventions, safe/appropriate HEP performance Person educated: Patient Education method: Explanation, Demonstration, Tactile cues, Verbal cues, and Handouts Education comprehension: verbalized understanding, returned demonstration, verbal cues required, tactile cues required, and needs further education    HOME EXERCISE PROGRAM: Access Code: [email or web address] URL: [email or web address] Date: 03/23/2023 Prepared by: Alm Jenny  Exercises - Seated Isometric Elbow Flexion  - 2-3 x daily - 1 sets - 5 reps - 3-5sec hold - Isometric Shoulder Flexion at Wall  - 2-3 x daily - 1 sets - 5 reps - 3-5sec hold  ASSESSMENT:  CLINICAL IMPRESSION: Patient is a 48 y.o. woman who was seen today for physical therapy evaluation and treatment for L shoulder pain ongoing 3-4 months. Reports difficulty with  lifting, reaching, and sitting unsupported. On exam she demonstrates concordant limitations in L GH mobility, shoulder/elbow MMT, postural deficits, and TTP of GH musculature. HEP today w/ introduction of isometrics with aim of improving stability and comfort with muscular contractions. No adverse events, endorses some soreness on departure. Recommend trial of skilled PT to address aforementioned deficits with aim of improving functional tolerance and reducing pain with typical activities. Pt departs today's session in no acute distress, all voiced concerns/questions addressed appropriately from PT perspective.      OBJECTIVE IMPAIRMENTS: decreased activity tolerance, decreased endurance, decreased mobility, decreased ROM, decreased strength, impaired UE functional use, improper body mechanics, postural dysfunction, and pain.   ACTIVITY LIMITATIONS: carrying, lifting, sitting, and reach over head  PARTICIPATION LIMITATIONS: meal prep, cleaning, laundry, community activity, and occupation  PERSONAL FACTORS: 3+ comorbidities: HTN, raynauds, depression, insomnia  are also affecting patient's functional outcome.   REHAB POTENTIAL: Good  CLINICAL DECISION MAKING: Stable/uncomplicated  EVALUATION COMPLEXITY: Low   GOALS:  SHORT TERM GOALS: Target date: 04/20/2023 Pt will demonstrate appropriate understanding and performance of initially prescribed HEP in order to facilitate improved independence with management of symptoms.  Baseline: HEP provided on eval Goal status: INITIAL   2. Pt will score greater than or equal to 61 on FOTO in order to demonstrate improved perception of function due to  symptoms.  Baseline: 54  Goal status: INITIAL    LONG TERM GOALS: Target date: 05/18/2023 Pt will score 67 on FOTO in order to demonstrate improved perception of function due to symptoms. Baseline: 54 Goal status: INITIAL  2.  Pt will demonstrate at least 150 degrees of active L shoulder elevation in order  to demonstrate improved tolerance to functional movement patterns such as reaching overhead.  Baseline: see ROM chart above Goal status: INITIAL  3.  Pt will demonstrate at least 4+/5 shoulder flex/abduction MMT on LUE for improved symmetry of UE strength and improved tolerance to functional movements.  Baseline: see MMT chart above Goal status: INITIAL  4. Pt will endorse waking up no more than 3-4x/week on average due to shoulder pain in order to improve overall health and QOL.  Baseline: 1-2x/night on average  Goal status: INITIAL  5. Pt will report at least 50% decrease in overall pain levels in past week in order to facilitate improved tolerance to basic ADLs/mobility.   Baseline: 0-9/10  Goal status: INITIAL    6. Pt will demonstrate functional combo ER/IR within at least 1 vertebra of contralateral limb in order to facilitate improved functional UE mobility.   Baseline: see ROM chart above  Goal status: INITIAL   PLAN:  PT FREQUENCY: 1x/week  PT DURATION: 8 weeks  PLANNED INTERVENTIONS: 97164- PT Re-evaluation, 97110-Therapeutic exercises, 97530- Therapeutic activity, 97112- Neuromuscular re-education, 97535- Self Care, [id number]- Manual therapy, 97014- Electrical stimulation (unattended), Patient/Family education, Stair training, Taping, Joint mobilization, Spinal mobilization, Cryotherapy, and Moist heat  PLAN FOR NEXT SESSION: Review/update HEP PRN. Work on [employer] exercises as appropriate with emphasis on GH isometrics, PROM/AAROM for [facility] mobility initially. Periscapular/postural work within pt tolerance. Symptom modification strategies as indicated/appropriate.    Alm DELENA Jenny PT, DPT 03/23/2023 4:31 PM

## 2023-03-23 ENCOUNTER — Ambulatory Visit: Payer: 59 | Admitting: Professional

## 2023-03-23 ENCOUNTER — Encounter: Payer: Self-pay | Admitting: Physical Therapy

## 2023-03-23 ENCOUNTER — Ambulatory Visit: Payer: 59 | Attending: Sports Medicine | Admitting: Physical Therapy

## 2023-03-23 DIAGNOSIS — M25512 Pain in left shoulder: Secondary | ICD-10-CM | POA: Insufficient documentation

## 2023-03-23 DIAGNOSIS — M6281 Muscle weakness (generalized): Secondary | ICD-10-CM | POA: Diagnosis present

## 2023-03-23 DIAGNOSIS — M7542 Impingement syndrome of left shoulder: Secondary | ICD-10-CM | POA: Insufficient documentation

## 2023-03-28 ENCOUNTER — Other Ambulatory Visit: Payer: Self-pay

## 2023-03-28 ENCOUNTER — Telehealth: Payer: Self-pay | Admitting: Family Medicine

## 2023-03-28 ENCOUNTER — Encounter: Payer: Self-pay | Admitting: Family Medicine

## 2023-03-28 DIAGNOSIS — B999 Unspecified infectious disease: Secondary | ICD-10-CM

## 2023-03-28 NOTE — Telephone Encounter (Signed)
 Orders Placed This Encounter  Procedures   Sedimentation rate   ANA   ANCA Titers ( [facility])   Urinalysis, Routine w reflex microscopic   Ambulatory referral to Allergy    Referral Priority:   Routine    Referral Type:   Allergy Testing    Referral Reason:   Specialty Services Required    Requested Specialty:   Allergy    Number of Visits Requested:   1

## 2023-03-30 ENCOUNTER — Encounter: Payer: Self-pay | Admitting: Physical Therapy

## 2023-03-30 ENCOUNTER — Ambulatory Visit: Payer: 59 | Admitting: Physical Therapy

## 2023-03-30 DIAGNOSIS — M6281 Muscle weakness (generalized): Secondary | ICD-10-CM

## 2023-03-30 DIAGNOSIS — M25512 Pain in left shoulder: Secondary | ICD-10-CM

## 2023-03-30 DIAGNOSIS — M7542 Impingement syndrome of left shoulder: Secondary | ICD-10-CM | POA: Diagnosis not present

## 2023-03-30 DIAGNOSIS — R293 Abnormal posture: Secondary | ICD-10-CM

## 2023-03-30 NOTE — Therapy (Signed)
 OUTPATIENT PHYSICAL THERAPY TREATMENT    Patient Name: April Webster MRN: [id number] DOB:[birth year], 48 y.o., female Today's Date: 03/30/2023  END OF SESSION:  PT End of Session - 03/30/23 1316     Visit Number 2    Number of Visits 9    Date for PT Re-Evaluation 05/18/23    Authorization Type [employer] employee [employer]    PT Start Time 1317    PT Stop Time 1358    PT Time Calculation (min) 41 min    Activity Tolerance Patient tolerated treatment well;Patient limited by pain              Past Medical History:  Diagnosis Date   Chronic idiopathic constipation    DDD (degenerative disc disease), cervical and lumbar    Female pattern hair loss    Graves disease    Hyperlipidemia    Hypertension    Hypothyroidism    Lichen sclerosus    Raynaud's disease    Past Surgical History:  Procedure Laterality Date   ABDOMINOPLASTY     AUGMENTATION MAMMAPLASTY Bilateral 05/2019   Silicone    CHOLECYSTECTOMY     DILITATION & CURRETTAGE/HYSTROSCOPY WITH NOVASURE ABLATION N/A 02/09/2022   Procedure: DILATATION & CURETTAGE/HYSTEROSCOPY WITH NOVASURE ABLATION, REMOVAL OF IUD;  Surgeon: Lacey Pian, MD;  Location: [facility] OR;  Service: Gynecology;  Laterality: N/A;   PLACEMENT OF BREAST IMPLANTS     THYROID  SURGERY     TUBAL LIGATION     Patient Active Problem List   Diagnosis Date Noted   Recurrent infections 03/02/2023   Screening for HIV (human immunodeficiency virus) 02/09/2023   Impingement syndrome, shoulder, left 12/21/2022   Attention deficit hyperactivity disorder (ADHD), combined type 07/20/2022   Primary insomnia 09/09/2021   Chronic pain of right knee 08/12/2021   LGSIL on Pap smear of cervix 07/01/2020   Raynaud's disease 02/18/2020   IC (interstitial cystitis) 02/18/2020   Chronic constipation 02/18/2020   DDD (degenerative disc disease), cervical 12/14/2019   Gastroesophageal reflux disease 05/16/2019   Hypothyroidism 03/19/2019   Hyperlipidemia 03/19/2019    Facet syndrome, lumbar 03/19/2019   Menorrhagia 03/19/2019   Female pattern hair loss 03/19/2019   Lichen sclerosus 03/19/2019   Upper airway cough syndrome 03/19/2019   IUD (intrauterine device) in place 12/06/2016   MDD (major depressive disorder), recurrent episode (HCC) 05/10/2011    PCP: Cydney Draft, MD  REFERRING PROVIDER: Gean Keels, MD  REFERRING DIAG: [phone] (ICD-10-CM) - Impingement syndrome, shoulder, left  THERAPY DIAG:  Left shoulder pain, unspecified chronicity  Muscle weakness (generalized)  Abnormal posture  Rationale for Evaluation and Treatment: Rehabilitation  ONSET DATE: 3-4 months   SUBJECTIVE:  Per eval - Pt endorses history of multiple illnesses over past couple of years which has affected her exercise routine. States she was beginning to exercise more, was trying to progress her overhead presses and felt an audible pull in her L shoulder. States that it was not painful but the next day felt sharp/stabbing pain in lateral/anterior shoulder. Trial of NSAIDs and home exercises without significant change. Pt states that she ended up getting steroid injection which gave her a few weeks of relief, but in early December was reaching forward, felt a sharp pain and has been exacerbated since. She notes difficulty with general movement of LUE but also endorses intermittent sharp pains while sitting unsupported. Difficulty sleeping as well, waking 1-2x/night on average. Does endorse some numbness in digits 4 and 5 - states this was occurring prior to shoulder pain but does seem more prominent now.  Endorses intermittent headaches. Denies any visual changes, swallowing/speech issues. No fevers. Endorses some night sweats she attributes to menopause. No BIL numbness aside from  raynaud's history.  Hand dominance: Right  SUBJECTIVE STATEMENT: 03/30/2023 was feeling okay until a couple days ago when she had to pull steering wheel suddenly. Has had more sharp pain since. Increased difficulty with UE movements and ADLs. Pt states she has been doing HEP and some light gym activity and this hasn't been more aggravated.   PERTINENT HISTORY: See above PMH  PAIN:  Are you having pain: 5-6/10  Per eval -  Location/description: L lateral/anterior shoulder. Posterior at times. Sometimes referring distally into digits 4 and 5 Best-worst over past week: 0-9/10  - aggravating factors: movements, difficulty reaching behind back, seat belts, holding items - Easing factors: hot bath    PRECAUTIONS: None   WEIGHT BEARING RESTRICTIONS: No  FALLS:  Has patient fallen in last 6 months? Reports frequent near falls, reports 1 major fall back in April   LIVING ENVIRONMENT: 2 story house 1 STE, bed/bath upstairs, bathroom downstairs. 14 stairs to second floor Lives w/ husband, 2 kids, 4 dogs, 2 snakes  OCCUPATION: Nurse Practitioner  PLOF: Independent  PATIENT GOALS: be able to move arm better, get back to strength training   NEXT MD VISIT: TBD   OBJECTIVE:  Note: Objective measures were completed at Evaluation unless otherwise noted.  DIAGNOSTIC FINDINGS:  02/28/23 L shoulder MRI "IMPRESSION: 1. Mild supraspinatus and infraspinatus tendinosis without a tear. 2.  No acute osseous injury of the left shoulder."  PATIENT SURVEYS:  FOTO 54 > 67   COGNITION: Overall cognitive status: Within functional limits for tasks assessed     SENSATION: Light touch intact B UE   POSTURE: Increased L UT elevation, rounded shoulders, tendency towards mildly guarded posture L UE  UPPER EXTREMITY ROM:  A/PROM Right eval Left eval  Shoulder flexion 162 150 deg   Shoulder abduction 170 105 deg *  Shoulder internal rotation (functional combo) T5 L3 *  Shoulder external  rotation (functional combo) T2 C5 *  Elbow flexion    Elbow extension    Wrist flexion    Wrist extension     (Blank rows = not tested) (Key: WFL = within functional limits not formally assessed, * = concordant pain, s = stiffness/stretching sensation, NT = not tested)  Comments:    UPPER EXTREMITY MMT:  MMT Right eval Left eval  Shoulder flexion 5 4 *   Shoulder extension    Shoulder abduction 5 4+  Shoulder extension    Shoulder internal rotation 5 4 *  Shoulder external rotation 5 4  Elbow flexion 5 4+   Elbow extension 5 4+  Grip strength    (Blank rows = not tested)  (Key: WFL = within functional limits not formally assessed, * = concordant pain, s = stiffness/stretching sensation, NT = not tested)  Comments:   SHOULDER SPECIAL TESTS: Positive neers on L   JOINT MOBILITY TESTING:  deferred  PALPATION:  Significant concordant TTP L bicipital groove, infraspinatus/teres. Mild tenderness L deltoid, rhomboid, UT/LS                                                                                                                             TREATMENT DATE:  [facility] Adult PT Treatment:                                                DATE: 03/30/23 Therapeutic Exercise: Seated isometric elbow flexion 5sec 5 reps cues for alignment Standing shoulder flexion iso 2x5 w/ 5 sec hold  Standing shoulder abduction isometric 2x5 Supine L shoulder AROM x5 Supine shoulder flexion AAROM w/ dowel x8 HEP update + handout/education  Manual Therapy: Supine w/ pillow support for elbow; gentle STM (open palm technique) to L anterior deltoid, biceps (mostly proximal), lateral deltoid, UT, LS, infraspinatus; passive pin and stretch L biceps; trigger point release L UT    [facility] Adult PT Treatment:                                                DATE: 03/23/23 Therapeutic Exercise: Isometric elbow flexion 5x5sec hold  Isometric shoulder flexion 5x5sec hold  HEP handout + education, cues/education  on appropriate monitoring/modification as needed   PATIENT EDUCATION: Education details: rationale for interventions, HEP  Person educated: Patient Education method: Explanation, Demonstration, Tactile cues, Verbal cues Education comprehension: verbalized understanding, returned demonstration, verbal cues required, tactile cues required, and needs further education     HOME EXERCISE PROGRAM: Access Code: [email or web address] URL: [email or web address] Date: 03/30/2023 Prepared by: Mayme Spearman  Exercises - Seated Isometric Elbow Flexion  - 2-3 x daily - 1 sets - 5 reps - 3-5sec hold - Isometric Shoulder Flexion at Wall  - 2-3 x daily - 1 sets - 5 reps - 3-5sec hold - Isometric Shoulder Abduction at Wall  - 2-3 x daily - 1 sets - 3-5sec hold - Supine Shoulder Flexion Extension AAROM with Dowel  - 2-3 x daily - 1 sets - 3-5 reps  ASSESSMENT:  CLINICAL IMPRESSION: 03/30/2023 Pt arrives w/ 5-6/10 pain, increased since episode a couple days ago of pulling steering wheel suddenly. Pt describes mild relief after manual techniques as above - continues with overall tenderness about GH musculature that is concentrated  around L biceps and anterior deltoid. Also has trigger point in L UT that refers into anterior shoulder. Reports mild improvement in pain after manual. Following with GH isometrics and gravity reduced AROM/AAROM, does well with isos but has mild increase in pain after mobility work. Departs w/ 6-7/10 pain on NPS, no adverse events. Recommend continuing along current POC in order to address relevant deficits and improve functional tolerance. Pt departs today's session in no acute distress, all voiced questions/concerns addressed appropriately from PT perspective.    Per eval - Patient is a 48 y.o. woman who was seen today for physical therapy evaluation and treatment for L shoulder pain ongoing 3-4 months. Reports difficulty with lifting, reaching, and sitting unsupported. On exam she  demonstrates concordant limitations in L GH mobility, shoulder/elbow MMT, postural deficits, and TTP of GH musculature. HEP today w/ introduction of isometrics with aim of improving stability and comfort with muscular contractions. No adverse events, endorses some soreness on departure. Recommend trial of skilled PT to address aforementioned deficits with aim of improving functional tolerance and reducing pain with typical activities. Pt departs today's session in no acute distress, all voiced concerns/questions addressed appropriately from PT perspective.      OBJECTIVE IMPAIRMENTS: decreased activity tolerance, decreased endurance, decreased mobility, decreased ROM, decreased strength, impaired UE functional use, improper body mechanics, postural dysfunction, and pain.   ACTIVITY LIMITATIONS: carrying, lifting, sitting, and reach over head  PARTICIPATION LIMITATIONS: meal prep, cleaning, laundry, community activity, and occupation  PERSONAL FACTORS: 3+ comorbidities: HTN, raynauds, depression, insomnia  are also affecting patient's functional outcome.   REHAB POTENTIAL: Good  CLINICAL DECISION MAKING: Stable/uncomplicated  EVALUATION COMPLEXITY: Low   GOALS:  SHORT TERM GOALS: Target date: 04/20/2023 Pt will demonstrate appropriate understanding and performance of initially prescribed HEP in order to facilitate improved independence with management of symptoms.  Baseline: HEP provided on eval Goal status: INITIAL   2. Pt will score greater than or equal to 61 on FOTO in order to demonstrate improved perception of function due to symptoms.  Baseline: 54  Goal status: INITIAL    LONG TERM GOALS: Target date: 05/18/2023 Pt will score 67 on FOTO in order to demonstrate improved perception of function due to symptoms. Baseline: 54 Goal status: INITIAL  2.  Pt will demonstrate at least 150 degrees of active L shoulder elevation in order to demonstrate improved tolerance to functional  movement patterns such as reaching overhead.  Baseline: see ROM chart above Goal status: INITIAL  3.  Pt will demonstrate at least 4+/5 shoulder flex/abduction MMT on LUE for improved symmetry of UE strength and improved tolerance to functional movements.  Baseline: see MMT chart above Goal status: INITIAL  4. Pt will endorse waking up no more than 3-4x/week on average due to shoulder pain in order to improve overall health and QOL.  Baseline: 1-2x/night on average  Goal status: INITIAL  5. Pt will report at least 50% decrease in overall pain levels in past week in order to facilitate improved tolerance to basic ADLs/mobility.   Baseline: 0-9/10  Goal status: INITIAL    6. Pt will demonstrate functional combo ER/IR within at least 1 vertebra of contralateral limb in order to facilitate improved functional UE mobility.   Baseline: see ROM chart above  Goal status: INITIAL   PLAN:  PT FREQUENCY: 1x/week  PT DURATION: 8 weeks  PLANNED INTERVENTIONS: 97164- PT Re-evaluation, 97110-Therapeutic exercises, 97530- Therapeutic activity, [id number]- Neuromuscular re-education, 97535- Self Care, 16109- Manual therapy, 97014- Electrical  stimulation (unattended), Patient/Family education, Stair training, Taping, Joint mobilization, Spinal mobilization, Cryotherapy, and Moist heat  PLAN FOR NEXT SESSION: Review/update HEP PRN. Work on [employer] exercises as appropriate with emphasis on GH isometrics, PROM/AAROM for [facility] mobility initially. Periscapular/postural work within pt tolerance. Symptom modification strategies as indicated/appropriate.    Lovett Ruck PT, DPT 03/30/2023 3:18 PM

## 2023-04-05 DIAGNOSIS — N898 Other specified noninflammatory disorders of vagina: Secondary | ICD-10-CM | POA: Diagnosis not present

## 2023-04-05 NOTE — Therapy (Signed)
OUTPATIENT PHYSICAL THERAPY TREATMENT    Patient Name: ELISABETH BEN MRN: [id number] DOB:09-15-1975, 48 y.o., female Today's Date: 04/06/2023  END OF SESSION:  PT End of Session - 04/06/23 1403     Visit Number 3    Number of Visits 9    Date for PT Re-Evaluation 05/18/23    Authorization Type [employer] employee [employer]    PT Start Time 1403    PT Stop Time 1444    PT Time Calculation (min) 41 min    Activity Tolerance Patient tolerated treatment well;Patient limited by pain               Past Medical History:  Diagnosis Date   Chronic idiopathic constipation    DDD (degenerative disc disease), cervical and lumbar    Female pattern hair loss    Graves disease    Hyperlipidemia    Hypertension    Hypothyroidism    Lichen sclerosus    Raynaud's disease    Past Surgical History:  Procedure Laterality Date   ABDOMINOPLASTY     AUGMENTATION MAMMAPLASTY Bilateral 05/2019   Silicone    CHOLECYSTECTOMY     DILITATION & CURRETTAGE/HYSTROSCOPY WITH NOVASURE ABLATION N/A 02/09/2022   Procedure: DILATATION & CURETTAGE/HYSTEROSCOPY WITH NOVASURE ABLATION, REMOVAL OF IUD;  Surgeon: Milas Hock, MD;  Location: [facility] OR;  Service: Gynecology;  Laterality: N/A;   PLACEMENT OF BREAST IMPLANTS     THYROID SURGERY     TUBAL LIGATION     Patient Active Problem List   Diagnosis Date Noted   Recurrent infections 03/02/2023   Screening for HIV (human immunodeficiency virus) 02/09/2023   Impingement syndrome, shoulder, left 12/21/2022   Attention deficit hyperactivity disorder (ADHD), combined type 07/20/2022   Primary insomnia 09/09/2021   Chronic pain of right knee 08/12/2021   LGSIL on Pap smear of cervix 07/01/2020   Raynaud's disease 02/18/2020   IC (interstitial cystitis) 02/18/2020   Chronic constipation 02/18/2020   DDD (degenerative disc disease), cervical 12/14/2019   Gastroesophageal reflux disease 05/16/2019   Hypothyroidism 03/19/2019   Hyperlipidemia 03/19/2019    Facet syndrome, lumbar 03/19/2019   Menorrhagia 03/19/2019   Female pattern hair loss 03/19/2019   Lichen sclerosus 03/19/2019   Upper airway cough syndrome 03/19/2019   IUD (intrauterine device) in place 12/06/2016   MDD (major depressive disorder), recurrent episode (HCC) 05/10/2011    PCP: Agapito Games, MD  REFERRING PROVIDER: Monica Becton, MD  REFERRING DIAG: [phone] (ICD-10-CM) - Impingement syndrome, shoulder, left  THERAPY DIAG:  Left shoulder pain, unspecified chronicity  Muscle weakness (generalized)  Abnormal posture  Rationale for Evaluation and Treatment: Rehabilitation  ONSET DATE: 3-4 months   SUBJECTIVE:  Per eval - Pt endorses history of multiple illnesses over past couple of years which has affected her exercise routine. States she was beginning to exercise more, was trying to progress her overhead presses and felt an audible pull in her L shoulder. States that it was not painful but the next day felt sharp/stabbing pain in lateral/anterior shoulder. Trial of NSAIDs and home exercises without significant change. Pt states that she ended up getting steroid injection which gave her a few weeks of relief, but in early December was reaching forward, felt a sharp pain and has been exacerbated since. She notes difficulty with general movement of LUE but also endorses intermittent sharp pains while sitting unsupported. Difficulty sleeping as well, waking 1-2x/night on average. Does endorse some numbness in digits 4 and 5 - states this was occurring prior to shoulder pain but does seem more prominent now.  Endorses intermittent headaches. Denies any visual changes, swallowing/speech issues. No fevers. Endorses some night sweats she attributes to menopause. No BIL numbness aside from  raynaud's history.  Hand dominance: Right  SUBJECTIVE STATEMENT: 04/06/2023 pt states she did well after last session, was doing better and had returned to light strengthening with good tolerance until yesterday afternoon when she suddenly pulled sliding door and exacerbated symptoms. Similar to last exacerbation but more intense, having pain down to wrist.   PERTINENT HISTORY: See above PMH  PAIN:  Are you having pain: 8/10 mostly lateral shoulder, neck  Per eval -  Location/description: L lateral/anterior shoulder. Posterior at times. Sometimes referring distally into digits 4 and 5 Best-worst over past week: 0-9/10  - aggravating factors: movements, difficulty reaching behind back, seat belts, holding items - Easing factors: hot bath    PRECAUTIONS: None   WEIGHT BEARING RESTRICTIONS: No  FALLS:  Has patient fallen in last 6 months? Reports frequent near falls, reports 1 major fall back in April   LIVING ENVIRONMENT: 2 story house 1 STE, bed/bath upstairs, bathroom downstairs. 14 stairs to second floor Lives w/ husband, 2 kids, 4 dogs, 2 snakes  OCCUPATION: Nurse Practitioner  PLOF: Independent  PATIENT GOALS: be able to move arm better, get back to strength training   NEXT MD VISIT: TBD   OBJECTIVE:  Note: Objective measures were completed at Evaluation unless otherwise noted.  DIAGNOSTIC FINDINGS:  02/28/23 L shoulder MRI "IMPRESSION: 1. Mild supraspinatus and infraspinatus tendinosis without a tear. 2.  No acute osseous injury of the left shoulder."  PATIENT SURVEYS:  FOTO 54 > 67   COGNITION: Overall cognitive status: Within functional limits for tasks assessed     SENSATION: Light touch intact B UE   POSTURE: Increased L UT elevation, rounded shoulders, tendency towards mildly guarded posture L UE  UPPER EXTREMITY ROM:  A/PROM Right eval Left eval  Shoulder flexion 162 150 deg   Shoulder abduction 170 105 deg *  Shoulder internal rotation  (functional combo) T5 L3 *  Shoulder external rotation (functional combo) T2 C5 *  Elbow flexion    Elbow extension    Wrist flexion    Wrist extension     (Blank rows = not tested) (Key: WFL = within functional limits not formally assessed, * = concordant pain, s = stiffness/stretching sensation, NT = not tested)  Comments:    UPPER EXTREMITY MMT:  MMT Right eval Left eval  Shoulder flexion 5 4 *   Shoulder extension    Shoulder abduction 5 4+  Shoulder extension    Shoulder internal rotation 5 4 *  Shoulder external rotation 5 4  Elbow flexion 5 4+   Elbow extension 5 4+  Grip strength    (Blank rows = not tested)  (Key: WFL = within functional limits not formally assessed, * = concordant pain, s = stiffness/stretching sensation, NT = not tested)  Comments:   SHOULDER SPECIAL TESTS: Positive neers on L   JOINT MOBILITY TESTING:  deferred  PALPATION:  Significant concordant TTP L bicipital groove, infraspinatus/teres. Mild tenderness L deltoid, rhomboid, UT/LS                                                                                                                             TREATMENT DATE:  [facility] Adult PT Treatment:                                                DATE: 04/06/23 Therapeutic Exercise: PT assisted eccentric shoulder press 3x5 LUE, last set with hands clasped Green band double ER iso 2x8 cues for positioning and comfortable tension Swiss ball flexion AAROM 2x10 cues for setup, appropriate positioning, and home setup HEP update + education/handout  Manual Therapy: Supine w/ elbow/shoulder supported; STM to L LS, UT, deltoid, biceps; passive pin and stretch L biceps; gentle trigger point release L LS, deltoid, and biceps Gentle PROM w/ light oscillations to reduce muscle guarding, L GH flexion/abduction    [facility] Adult PT Treatment:                                                DATE: 03/30/23 Therapeutic Exercise: Seated isometric elbow flexion  5sec 5 reps cues for alignment Standing shoulder flexion iso 2x5 w/ 5 sec hold  Standing shoulder abduction isometric 2x5 Supine L shoulder AROM x5 Supine shoulder flexion AAROM w/ dowel x8 HEP update + handout/education  Manual Therapy: Supine w/ pillow support for elbow; gentle STM (open palm technique) to L anterior deltoid, biceps (mostly proximal), lateral deltoid, UT, LS, infraspinatus; passive pin and stretch L biceps; trigger point release L UT    [facility] Adult PT Treatment:                                                DATE: 03/23/23 Therapeutic Exercise: Isometric elbow flexion 5x5sec hold  Isometric shoulder flexion 5x5sec hold  HEP handout + education, cues/education on appropriate monitoring/modification as needed   PATIENT EDUCATION: Education details: rationale for interventions, HEP  Person educated: Patient Education method: Explanation, Demonstration, Tactile cues, Verbal cues Education comprehension: verbalized understanding, returned demonstration, verbal cues required, tactile cues required, and needs  further education     HOME EXERCISE PROGRAM: Access Code: [email or web address] URL: [email or web address] Date: 03/30/2023 Prepared by: Fransisco Hertz  Exercises - Seated Isometric Elbow Flexion  - 2-3 x daily - 1 sets - 5 reps - 3-5sec hold - Isometric Shoulder Flexion at Wall  - 2-3 x daily - 1 sets - 5 reps - 3-5sec hold - Isometric Shoulder Abduction at Wall  - 2-3 x daily - 1 sets - 3-5sec hold - Supine Shoulder Flexion Extension AAROM with Dowel  - 2-3 x daily - 1 sets - 3-5 reps  ASSESSMENT:  CLINICAL IMPRESSION: 04/06/2023 Pt arrives w/ report of increased pain today, no issues after last session. Given exacerbation, session initiated w/ manual therapy as above focusing on symptom modulation and reducing muscle guarding. Following this with exercises focusing on comfortable muscle contractions about shoulder girdle with emphasis on anterior shoulder/RC as  these muscles are most irritable on palpation. Symptoms remain fairly irritable throughout, although pt reports mild improvement in pain prior to departure, unrated on NPS. No adverse events. HEP update for gentle ROM with aim of reducing muscle guarding. Recommend continuing along current POC in order to address relevant deficits and improve functional tolerance. Pt departs today's session in no acute distress, all voiced questions/concerns addressed appropriately from PT perspective.    Per eval - Patient is a 48 y.o. woman who was seen today for physical therapy evaluation and treatment for L shoulder pain ongoing 3-4 months. Reports difficulty with lifting, reaching, and sitting unsupported. On exam she demonstrates concordant limitations in L GH mobility, shoulder/elbow MMT, postural deficits, and TTP of GH musculature. HEP today w/ introduction of isometrics with aim of improving stability and comfort with muscular contractions. No adverse events, endorses some soreness on departure. Recommend trial of skilled PT to address aforementioned deficits with aim of improving functional tolerance and reducing pain with typical activities. Pt departs today's session in no acute distress, all voiced concerns/questions addressed appropriately from PT perspective.      OBJECTIVE IMPAIRMENTS: decreased activity tolerance, decreased endurance, decreased mobility, decreased ROM, decreased strength, impaired UE functional use, improper body mechanics, postural dysfunction, and pain.   ACTIVITY LIMITATIONS: carrying, lifting, sitting, and reach over head  PARTICIPATION LIMITATIONS: meal prep, cleaning, laundry, community activity, and occupation  PERSONAL FACTORS: 3+ comorbidities: HTN, raynauds, depression, insomnia  are also affecting patient's functional outcome.   REHAB POTENTIAL: Good  CLINICAL DECISION MAKING: Stable/uncomplicated  EVALUATION COMPLEXITY: Low   GOALS:  SHORT TERM GOALS: Target date:  04/20/2023 Pt will demonstrate appropriate understanding and performance of initially prescribed HEP in order to facilitate improved independence with management of symptoms.  Baseline: HEP provided on eval Goal status: INITIAL   2. Pt will score greater than or equal to 61 on FOTO in order to demonstrate improved perception of function due to symptoms.  Baseline: 54  Goal status: INITIAL    LONG TERM GOALS: Target date: 05/18/2023 Pt will score 67 on FOTO in order to demonstrate improved perception of function due to symptoms. Baseline: 54 Goal status: INITIAL  2.  Pt will demonstrate at least 150 degrees of active L shoulder elevation in order to demonstrate improved tolerance to functional movement patterns such as reaching overhead.  Baseline: see ROM chart above Goal status: INITIAL  3.  Pt will demonstrate at least 4+/5 shoulder flex/abduction MMT on LUE for improved symmetry of UE strength and improved tolerance to functional movements.  Baseline: see MMT chart above Goal status: INITIAL  4. Pt  will endorse waking up no more than 3-4x/week on average due to shoulder pain in order to improve overall health and QOL.  Baseline: 1-2x/night on average  Goal status: INITIAL  5. Pt will report at least 50% decrease in overall pain levels in past week in order to facilitate improved tolerance to basic ADLs/mobility.   Baseline: 0-9/10  Goal status: INITIAL    6. Pt will demonstrate functional combo ER/IR within at least 1 vertebra of contralateral limb in order to facilitate improved functional UE mobility.   Baseline: see ROM chart above  Goal status: INITIAL   PLAN:  PT FREQUENCY: 1x/week  PT DURATION: 8 weeks  PLANNED INTERVENTIONS: 97164- PT Re-evaluation, 97110-Therapeutic exercises, 97530- Therapeutic activity, 97112- Neuromuscular re-education, 97535- Self Care, 47829- Manual therapy, 97014- Electrical stimulation (unattended), Patient/Family education, Stair training,  Taping, Joint mobilization, Spinal mobilization, Cryotherapy, and Moist heat  PLAN FOR NEXT SESSION: Review/update HEP PRN. Work on [employer] exercises as appropriate with emphasis on GH isometrics, PROM/AAROM for [facility] mobility initially. Periscapular/postural work within pt tolerance. Symptom modification strategies as indicated/appropriate.    Ashley Murrain PT, DPT 04/06/2023 4:51 PM

## 2023-04-06 ENCOUNTER — Other Ambulatory Visit: Payer: Self-pay | Admitting: Family Medicine

## 2023-04-06 ENCOUNTER — Encounter: Payer: Self-pay | Admitting: Physical Therapy

## 2023-04-06 ENCOUNTER — Other Ambulatory Visit: Payer: Self-pay

## 2023-04-06 ENCOUNTER — Ambulatory Visit: Payer: 59

## 2023-04-06 ENCOUNTER — Ambulatory Visit: Payer: 59 | Admitting: Professional

## 2023-04-06 ENCOUNTER — Ambulatory Visit: Payer: 59 | Admitting: Physical Therapy

## 2023-04-06 DIAGNOSIS — M25512 Pain in left shoulder: Secondary | ICD-10-CM | POA: Diagnosis not present

## 2023-04-06 DIAGNOSIS — M6281 Muscle weakness (generalized): Secondary | ICD-10-CM

## 2023-04-06 DIAGNOSIS — K219 Gastro-esophageal reflux disease without esophagitis: Secondary | ICD-10-CM

## 2023-04-06 DIAGNOSIS — R293 Abnormal posture: Secondary | ICD-10-CM

## 2023-04-06 DIAGNOSIS — N898 Other specified noninflammatory disorders of vagina: Secondary | ICD-10-CM

## 2023-04-06 DIAGNOSIS — M7542 Impingement syndrome of left shoulder: Secondary | ICD-10-CM | POA: Diagnosis not present

## 2023-04-07 LAB — WET PREP FOR TRICH, YEAST, CLUE
Clue Cell Exam: NEGATIVE
Trichomonas Exam: NEGATIVE
Yeast Exam: NEGATIVE

## 2023-04-08 ENCOUNTER — Other Ambulatory Visit (HOSPITAL_COMMUNITY): Payer: Self-pay

## 2023-04-08 ENCOUNTER — Other Ambulatory Visit: Payer: Self-pay

## 2023-04-08 ENCOUNTER — Encounter: Payer: Self-pay | Admitting: Family Medicine

## 2023-04-08 MED ORDER — PANTOPRAZOLE SODIUM 20 MG PO TBEC
20.0000 mg | DELAYED_RELEASE_TABLET | Freq: Every day | ORAL | 3 refills | Status: DC
  Filled 2023-04-08: qty 90, 90d supply, fill #0
  Filled 2023-07-17: qty 90, 90d supply, fill #1
  Filled 2023-10-17: qty 90, 90d supply, fill #2
  Filled 2024-01-18: qty 90, 90d supply, fill #3

## 2023-04-08 NOTE — Progress Notes (Signed)
All negative

## 2023-04-11 DIAGNOSIS — R748 Abnormal levels of other serum enzymes: Secondary | ICD-10-CM | POA: Diagnosis not present

## 2023-04-11 DIAGNOSIS — B999 Unspecified infectious disease: Secondary | ICD-10-CM | POA: Diagnosis not present

## 2023-04-11 DIAGNOSIS — E039 Hypothyroidism, unspecified: Secondary | ICD-10-CM | POA: Diagnosis not present

## 2023-04-12 ENCOUNTER — Other Ambulatory Visit (HOSPITAL_COMMUNITY): Payer: Self-pay

## 2023-04-12 ENCOUNTER — Encounter: Payer: Self-pay | Admitting: Family Medicine

## 2023-04-12 ENCOUNTER — Encounter: Payer: Self-pay | Admitting: Pharmacist

## 2023-04-12 ENCOUNTER — Other Ambulatory Visit: Payer: Self-pay

## 2023-04-12 LAB — CMP14+EGFR
ALT: 53 [IU]/L — ABNORMAL HIGH (ref 0–32)
AST: 46 [IU]/L — ABNORMAL HIGH (ref 0–40)
Albumin: 4.4 g/dL (ref 3.9–4.9)
Alkaline Phosphatase: 82 [IU]/L (ref 44–121)
BUN/Creatinine Ratio: 31 — ABNORMAL HIGH (ref 9–23)
BUN: 21 mg/dL (ref 6–24)
Bilirubin Total: 0.2 mg/dL (ref 0.0–1.2)
CO2: 23 mmol/L (ref 20–29)
Calcium: 9.2 mg/dL (ref 8.7–10.2)
Chloride: 98 mmol/L (ref 96–106)
Creatinine, Ser: 0.68 mg/dL (ref 0.57–1.00)
Globulin, Total: 2.1 g/dL (ref 1.5–4.5)
Glucose: 98 mg/dL (ref 70–99)
Potassium: 3.8 mmol/L (ref 3.5–5.2)
Sodium: 138 mmol/L (ref 134–144)
Total Protein: 6.5 g/dL (ref 6.0–8.5)
eGFR: 109 mL/min/{1.73_m2} (ref >59)

## 2023-04-12 LAB — TSH: TSH: 1.02 u[IU]/mL (ref 0.450–4.500)

## 2023-04-12 NOTE — Therapy (Signed)
OUTPATIENT PHYSICAL THERAPY TREATMENT    Patient Name: MUNACHIMSO RIGDON MRN: [id number] DOB:[birth year], 48 y.o., female Today's Date: 04/13/2023  END OF SESSION:  PT End of Session - 04/13/23 1314     Visit Number 4    Number of Visits 9    Date for PT Re-Evaluation 05/18/23    Authorization Type [employer] employee a[employer]    PT Start Time 1315    PT Stop Time 1400    PT Time Calculation (min) 45 min    Activity Tolerance Patient tolerated treatment well                Past Medical History:  Diagnosis Date   Chronic idiopathic constipation    DDD (degenerative disc disease), cervical and lumbar    Female pattern hair loss    Graves disease    Hyperlipidemia    Hypertension    Hypothyroidism    Lichen sclerosus    Raynaud's disease    Past Surgical History:  Procedure Laterality Date   ABDOMINOPLASTY     AUGMENTATION MAMMAPLASTY Bilateral 05/2019   Silicone    CHOLECYSTECTOMY     DILITATION & CURRETTAGE/HYSTROSCOPY WITH NOVASURE ABLATION N/A 02/09/2022   Procedure: DILATATION & CURETTAGE/HYSTEROSCOPY WITH NOVASURE ABLATION, REMOVAL OF IUD;  Surgeon: Milas Hock, MD;  Location: [facility] OR;  Service: Gynecology;  Laterality: N/A;   PLACEMENT OF BREAST IMPLANTS     THYROID SURGERY     TUBAL LIGATION     Patient Active Problem List   Diagnosis Date Noted   Chronic neck pain 04/13/2023   Recurrent infections 03/02/2023   Impingement syndrome, shoulder, left 12/21/2022   Attention deficit hyperactivity disorder (ADHD), combined type 07/20/2022   Primary insomnia 09/09/2021   Chronic pain of right knee 08/12/2021   LGSIL on Pap smear of cervix 07/01/2020   Raynaud's disease 02/18/2020   IC (interstitial cystitis) 02/18/2020   Chronic constipation 02/18/2020   DDD (degenerative disc disease), cervical 12/14/2019   Gastroesophageal reflux disease 05/16/2019   Hypothyroidism 03/19/2019   Hyperlipidemia 03/19/2019   Facet syndrome, lumbar 03/19/2019   Female pattern  hair loss 03/19/2019   Lichen sclerosus 03/19/2019   Upper airway cough syndrome 03/19/2019   IUD (intrauterine device) in place 12/06/2016   MDD (major depressive disorder), recurrent episode (HCC) 05/10/2011    PCP: Agapito Games, MD  REFERRING PROVIDER: Monica Becton, MD  REFERRING DIAG: [phone] (ICD-10-CM) - Impingement syndrome, shoulder, left  THERAPY DIAG:  Left shoulder pain, unspecified chronicity  Muscle weakness (generalized)  Abnormal posture  Rationale for Evaluation and Treatment: Rehabilitation  ONSET DATE: 3-4 months   SUBJECTIVE:  Per eval - Pt endorses history of multiple illnesses over past couple of years which has affected her exercise routine. States she was beginning to exercise more, was trying to progress her overhead presses and felt an audible pull in her L shoulder. States that it was not painful but the next day felt sharp/stabbing pain in lateral/anterior shoulder. Trial of NSAIDs and home exercises without significant change. Pt states that she ended up getting steroid injection which gave her a few weeks of relief, but in early December was reaching forward, felt a sharp pain and has been exacerbated since. She notes difficulty with general movement of LUE but also endorses intermittent sharp pains while sitting unsupported. Difficulty sleeping as well, waking 1-2x/night on average. Does endorse some numbness in digits 4 and 5 - states this was occurring prior to shoulder pain but does seem more prominent now.  Endorses intermittent headaches. Denies any visual changes, swallowing/speech issues. No fevers. Endorses some night sweats she attributes to menopause. No BIL numbness aside from raynaud's history.  Hand dominance: Right  SUBJECTIVE STATEMENT: 04/13/2023  still feeling a bit inflamed in shoulder girdle but better than last week. Has had some deltoid spasms. Not having as much wrist pain. Has been doing HEP    PERTINENT HISTORY: See above PMH  PAIN:  Are you having pain: 4-5/10, L UT, deltoid  Per eval -  Location/description: L lateral/anterior shoulder. Posterior at times. Sometimes referring distally into digits 4 and 5 Best-worst over past week: 0-9/10  - aggravating factors: movements, difficulty reaching behind back, seat belts, holding items - Easing factors: hot bath    PRECAUTIONS: None   WEIGHT BEARING RESTRICTIONS: No  FALLS:  Has patient fallen in last 6 months? Reports frequent near falls, reports 1 major fall back in April   LIVING ENVIRONMENT: 2 story house 1 STE, bed/bath upstairs, bathroom downstairs. 14 stairs to second floor Lives w/ husband, 2 kids, 4 dogs, 2 snakes  OCCUPATION: Nurse Practitioner  PLOF: Independent  PATIENT GOALS: be able to move arm better, get back to strength training   NEXT MD VISIT: TBD   OBJECTIVE:  Note: Objective measures were completed at Evaluation unless otherwise noted.  DIAGNOSTIC FINDINGS:  02/28/23 L shoulder MRI "IMPRESSION: 1. Mild supraspinatus and infraspinatus tendinosis without a tear. 2.  No acute osseous injury of the left shoulder."  PATIENT SURVEYS:  FOTO 54 > 67   COGNITION: Overall cognitive status: Within functional limits for tasks assessed     SENSATION: Light touch intact B UE   POSTURE: Increased L UT elevation, rounded shoulders, tendency towards mildly guarded posture L UE  UPPER EXTREMITY ROM:  A/PROM Right eval Left eval Left 04/13/23  Shoulder flexion 162 150 deg  164 deg   Shoulder abduction 170 105 deg * 108 deg *  Shoulder internal rotation (functional combo) T5 L3 *   Shoulder external rotation (functional combo) T2 C5 *   Elbow flexion     Elbow extension     Wrist flexion     Wrist extension      (Blank rows = not  tested) (Key: WFL = within functional limits not formally assessed, * = concordant pain, s = stiffness/stretching sensation, NT = not tested)  Comments:    UPPER EXTREMITY MMT:  MMT Right eval Left eval  Shoulder flexion 5 4 *   Shoulder extension    Shoulder abduction 5 4+  Shoulder extension    Shoulder internal rotation 5 4 *  Shoulder  external rotation 5 4  Elbow flexion 5 4+   Elbow extension 5 4+  Grip strength    (Blank rows = not tested)  (Key: WFL = within functional limits not formally assessed, * = concordant pain, s = stiffness/stretching sensation, NT = not tested)  Comments:   SHOULDER SPECIAL TESTS: Positive neers on L   JOINT MOBILITY TESTING:  deferred  PALPATION:  Significant concordant TTP L bicipital groove, infraspinatus/teres. Mild tenderness L deltoid, rhomboid, UT/LS                                                                                                                             TREATMENT DATE:  [facility] Adult PT Treatment:                                                DATE: 04/13/23 Therapeutic Exercise: Double band ER band at wrist, green, 2x10 Rhomboid stretch 3x30sec LUE Green band bicep curl to 90 deg x12 Green band high>low row 2x8  HEP update + education/handout  Manual Therapy: Seated; STM to L rhomboid, middle trap, upper trap. Trigger point release L LS and rhomboid Supine; STM to biceps, anterior deltoid, middle deltoid, upper trap    [facility] Adult PT Treatment:                                                DATE: 04/06/23 Therapeutic Exercise: PT assisted eccentric shoulder press 3x5 LUE, last set with hands clasped Green band double ER iso 2x8 cues for positioning and comfortable tension Swiss ball flexion AAROM 2x10 cues for setup, appropriate positioning, and home setup HEP update + education/handout  Manual Therapy: Supine w/ elbow/shoulder supported; STM to L LS, UT, deltoid, biceps; passive pin and stretch L biceps;  gentle trigger point release L LS, deltoid, and biceps Gentle PROM w/ light oscillations to reduce muscle guarding, L GH flexion/abduction    [facility] Adult PT Treatment:                                                DATE: 03/30/23 Therapeutic Exercise: Seated isometric elbow flexion 5sec 5 reps cues for alignment Standing shoulder flexion iso 2x5 w/ 5 sec hold  Standing shoulder abduction isometric 2x5 Supine L shoulder AROM x5 Supine shoulder flexion AAROM w/ dowel x8 HEP update + handout/education  Manual Therapy: Supine w/ pillow support for elbow; gentle STM (open palm technique) to L anterior deltoid, biceps (mostly proximal), lateral deltoid, UT, LS, infraspinatus; passive pin and stretch L biceps; trigger point release L UT  PATIENT EDUCATION: Education details: rationale for interventions, HEP  Person educated: Patient Education method: Explanation, Demonstration, Tactile cues, Verbal cues Education comprehension: verbalized understanding, returned demonstration, verbal cues required, tactile cues required, and needs further education     HOME EXERCISE PROGRAM: Access Code: [email or web address] URL: [email or web address] Date: 04/13/2023 Prepared by: Fransisco Hertz  Program Notes - can do swiss ball flexion standing, as done in clinic  Exercises - Seated Flexion Stretch with Swiss Ball  - 2-3 x daily - 1 sets - 8-10 reps - Standing Single Arm Elbow Flexion with Resistance  - 2-3 x daily - 1 sets - 8 reps - Doorway Rhomboid Stretch  - 2-3 x daily - 1 sets - 1-3 reps - 30sec hold - Standing High Row with Anchored Resistance  - 2-3 x daily - 1 sets - 8 reps  ASSESSMENT:  CLINICAL IMPRESSION: 04/13/2023 Pt arrives w/ 4-5/10 pain, no issues after last session. Today continuing with manual which pt reports modest relief with, noted trigger points in biceps and periscapular musculature. Able to progress volume for RC/periscapular work, ROM for biceps work. Pt also endorses  significant relief with addition of doorway rhomboid stretch which is added to HEP. No adverse events, pt tolerates session well overall with report of mild improvement in symptoms on departure (3-4/10). Recommend continuing along current POC in order to address relevant deficits and improve functional tolerance. Pt departs today's session in no acute distress, all voiced questions/concerns addressed appropriately from PT perspective.     Per eval - Patient is a 48 y.o. woman who was seen today for physical therapy evaluation and treatment for L shoulder pain ongoing 3-4 months. Reports difficulty with lifting, reaching, and sitting unsupported. On exam she demonstrates concordant limitations in L GH mobility, shoulder/elbow MMT, postural deficits, and TTP of GH musculature. HEP today w/ introduction of isometrics with aim of improving stability and comfort with muscular contractions. No adverse events, endorses some soreness on departure. Recommend trial of skilled PT to address aforementioned deficits with aim of improving functional tolerance and reducing pain with typical activities. Pt departs today's session in no acute distress, all voiced concerns/questions addressed appropriately from PT perspective.      OBJECTIVE IMPAIRMENTS: decreased activity tolerance, decreased endurance, decreased mobility, decreased ROM, decreased strength, impaired UE functional use, improper body mechanics, postural dysfunction, and pain.   ACTIVITY LIMITATIONS: carrying, lifting, sitting, and reach over head  PARTICIPATION LIMITATIONS: meal prep, cleaning, laundry, community activity, and occupation  PERSONAL FACTORS: 3+ comorbidities: HTN, raynauds, depression, insomnia  are also affecting patient's functional outcome.   REHAB POTENTIAL: Good  CLINICAL DECISION MAKING: Stable/uncomplicated  EVALUATION COMPLEXITY: Low   GOALS:  SHORT TERM GOALS: Target date: 04/20/2023 Pt will demonstrate appropriate  understanding and performance of initially prescribed HEP in order to facilitate improved independence with management of symptoms.  Baseline: HEP provided on eval Goal status: INITIAL   2. Pt will score greater than or equal to 61 on FOTO in order to demonstrate improved perception of function due to symptoms.  Baseline: 54  Goal status: INITIAL    LONG TERM GOALS: Target date: 05/18/2023 Pt will score 67 on FOTO in order to demonstrate improved perception of function due to symptoms. Baseline: 54 Goal status: INITIAL  2.  Pt will demonstrate at least 150 degrees of active L shoulder elevation in order to demonstrate improved tolerance to functional movement patterns such as reaching overhead.  Baseline: see ROM chart above Goal status: INITIAL  3.  Pt will  demonstrate at least 4+/5 shoulder flex/abduction MMT on LUE for improved symmetry of UE strength and improved tolerance to functional movements.  Baseline: see MMT chart above Goal status: INITIAL  4. Pt will endorse waking up no more than 3-4x/week on average due to shoulder pain in order to improve overall health and QOL.  Baseline: 1-2x/night on average  Goal status: INITIAL  5. Pt will report at least 50% decrease in overall pain levels in past week in order to facilitate improved tolerance to basic ADLs/mobility.   Baseline: 0-9/10  Goal status: INITIAL    6. Pt will demonstrate functional combo ER/IR within at least 1 vertebra of contralateral limb in order to facilitate improved functional UE mobility.   Baseline: see ROM chart above  Goal status: INITIAL   PLAN:  PT FREQUENCY: 1x/week  PT DURATION: 8 weeks  PLANNED INTERVENTIONS: 97164- PT Re-evaluation, 97110-Therapeutic exercises, 97530- Therapeutic activity, 97112- Neuromuscular re-education, 97535- Self Care, 16109- Manual therapy, 97014- Electrical stimulation (unattended), Patient/Family education, Stair training, Taping, Joint mobilization, Spinal  mobilization, Cryotherapy, and Moist heat  PLAN FOR NEXT SESSION: Review/update HEP PRN. Work on [employer] exercises as appropriate with emphasis on GH isometrics, PROM/AAROM for [facility] mobility initially. Periscapular/postural work within pt tolerance. Symptom modification strategies as indicated/appropriate.    Ashley Murrain PT, DPT 04/13/2023 4:36 PM

## 2023-04-12 NOTE — Progress Notes (Signed)
Can we call lab and see if they would be able to add a lipid panel?

## 2023-04-12 NOTE — Progress Notes (Signed)
Hi Ledia, kidney function looks great.  Liver enzymes are stable..  thyroid Looks perfect at 1.0.  Additional labs are still pending.

## 2023-04-13 ENCOUNTER — Encounter: Payer: Self-pay | Admitting: Family Medicine

## 2023-04-13 ENCOUNTER — Ambulatory Visit (INDEPENDENT_AMBULATORY_CARE_PROVIDER_SITE_OTHER): Payer: 59 | Admitting: Family Medicine

## 2023-04-13 ENCOUNTER — Telehealth: Payer: Self-pay | Admitting: Emergency Medicine

## 2023-04-13 ENCOUNTER — Other Ambulatory Visit: Payer: Self-pay

## 2023-04-13 ENCOUNTER — Ambulatory Visit: Payer: 59 | Admitting: Physical Therapy

## 2023-04-13 ENCOUNTER — Encounter: Payer: Self-pay | Admitting: Emergency Medicine

## 2023-04-13 ENCOUNTER — Other Ambulatory Visit (HOSPITAL_COMMUNITY): Payer: Self-pay

## 2023-04-13 VITALS — BP 104/71 | HR 73 | Temp 98.3°F | Ht 63.0 in | Wt 132.6 lb

## 2023-04-13 DIAGNOSIS — M25512 Pain in left shoulder: Secondary | ICD-10-CM | POA: Diagnosis not present

## 2023-04-13 DIAGNOSIS — Z0000 Encounter for general adult medical examination without abnormal findings: Secondary | ICD-10-CM | POA: Diagnosis not present

## 2023-04-13 DIAGNOSIS — E039 Hypothyroidism, unspecified: Secondary | ICD-10-CM | POA: Diagnosis not present

## 2023-04-13 DIAGNOSIS — M7542 Impingement syndrome of left shoulder: Secondary | ICD-10-CM | POA: Diagnosis not present

## 2023-04-13 DIAGNOSIS — F5101 Primary insomnia: Secondary | ICD-10-CM

## 2023-04-13 DIAGNOSIS — G8929 Other chronic pain: Secondary | ICD-10-CM | POA: Diagnosis not present

## 2023-04-13 DIAGNOSIS — F330 Major depressive disorder, recurrent, mild: Secondary | ICD-10-CM

## 2023-04-13 DIAGNOSIS — K5909 Other constipation: Secondary | ICD-10-CM

## 2023-04-13 DIAGNOSIS — M6281 Muscle weakness (generalized): Secondary | ICD-10-CM

## 2023-04-13 DIAGNOSIS — F902 Attention-deficit hyperactivity disorder, combined type: Secondary | ICD-10-CM

## 2023-04-13 DIAGNOSIS — M542 Cervicalgia: Secondary | ICD-10-CM

## 2023-04-13 DIAGNOSIS — R293 Abnormal posture: Secondary | ICD-10-CM

## 2023-04-13 LAB — URINALYSIS, ROUTINE W REFLEX MICROSCOPIC
Bilirubin, UA: NEGATIVE
Glucose, UA: NEGATIVE
Ketones, UA: NEGATIVE
Leukocytes,UA: NEGATIVE
Nitrite, UA: NEGATIVE
Protein,UA: NEGATIVE
RBC, UA: NEGATIVE
Specific Gravity, UA: 1.015 (ref 1.005–1.030)
Urobilinogen, Ur: 0.2 mg/dL (ref 0.2–1.0)
pH, UA: 5.5 (ref 5.0–7.5)

## 2023-04-13 LAB — ANCA TITERS

## 2023-04-13 LAB — SEDIMENTATION RATE: Sed Rate: 2 mm/h (ref 0–32)

## 2023-04-13 LAB — ANA: Anti Nuclear Antibody (ANA): POSITIVE — AB

## 2023-04-13 MED ORDER — LISDEXAMFETAMINE DIMESYLATE 30 MG PO CAPS
30.0000 mg | ORAL_CAPSULE | Freq: Every day | ORAL | 0 refills | Status: DC
  Filled 2023-04-13: qty 30, 30d supply, fill #0

## 2023-04-13 MED ORDER — DIAZEPAM 5 MG PO TABS
2.5000 mg | ORAL_TABLET | Freq: Every evening | ORAL | 0 refills | Status: DC | PRN
  Filled 2023-04-13: qty 30, 30d supply, fill #0

## 2023-04-13 NOTE — Assessment & Plan Note (Addendum)
Flowsheet Row Office Visit from 04/13/2023 in [facility] at [facility]  PHQ-9 Total Score 14         04/13/2023   12:16 PM 12/15/2022   11:58 AM 01/21/2022    2:00 PM 08/14/2020   11:59 AM  GAD 7 : Generalized Anxiety Score  Nervous, Anxious, on Edge 2 0 2 0  Control/stop worrying 1 0 1 0  Worry too much - different things 2 1 2  0  Trouble relaxing 1 1 3  0  Restless 1 0 0 0  Easily annoyed or irritable 3 3 3 2   Afraid - awful might happen 0 0 0 0  Total GAD 7 Score 10 5 11 2   Anxiety Difficulty Somewhat difficult Not difficult at all Somewhat difficult Not difficult at all   Chronic fatigue and energy contributing as well.  Recently changed to Effexor.

## 2023-04-13 NOTE — Telephone Encounter (Signed)
Lipid has been added

## 2023-04-13 NOTE — Assessment & Plan Note (Signed)
Try Vyvanse 30mg . New rx sent. F/U 1 mo

## 2023-04-13 NOTE — Assessment & Plan Note (Signed)
Trulance is going to be $300 with new insurance. Using senakot too.  Consider trial of lliquid mag citrate on weekends and maybe tabs daily.

## 2023-04-13 NOTE — Telephone Encounter (Signed)
Whichever one they can run it off.

## 2023-04-13 NOTE — Assessment & Plan Note (Signed)
Trazodone working well. Will work on improving pain levels at night as well.

## 2023-04-13 NOTE — Progress Notes (Signed)
Negative ANCA, good news

## 2023-04-13 NOTE — Assessment & Plan Note (Signed)
Recent TSH perfect!!

## 2023-04-13 NOTE — Progress Notes (Signed)
Complete physical exam  Patient: April Webster   DOB: 07-Feb-1976   47 y.o. Female  MRN: [id number]  Subjective:    Chief Complaint  Patient presents with   Annual Exam    April Webster is a 48 y.o. female who presents today for a complete physical exam. She reports consuming a general diet.  Working out some. Recent shoulder injury.   She generally feels poorly. She reports sleeping well. She does have additional problems to discuss today.   Updates - ]  Going to PT for her left shoulder.    Having alto of pain at night in neck, shoulders and head and not really getting pain relief with current regimen of NSAID, Tizanidine and Lyrica 150 mg BID  Feels tired and exhausted during the day. Getting worse  Sleep is better on trazodone. Has been trying to go to bed around 11-11:30. Gets up at 5:30. Has been waking up around 3AM and having hard time falling back asleep.    Most recent fall risk assessment:    02/09/2023    2:00 PM  Fall Risk   Falls in the past year? 1  Number falls in past yr: 1  Injury with Fall? 1  Risk for fall due to : History of fall(s)  Follow up Falls evaluation completed     Most recent depression screenings:    04/13/2023   12:15 PM 12/15/2022   11:57 AM  PHQ 2/9 Scores  PHQ - 2 Score 3 2  PHQ- 9 Score 14 8         Patient Care Team: Agapito Games, MD as PCP - General (Family Medicine)   Outpatient Medications Prior to Visit  Medication Sig   albuterol (VENTOLIN HFA) 108 (90 Base) MCG/ACT inhaler Inhale 2 puffs into the lungs every 6 (six) hours as needed for wheezing or shortness of breath.   amLODipine (NORVASC) 2.5 MG tablet Take 1 tablet (2.5 mg total) by mouth daily.   atorvastatin (LIPITOR) 20 MG tablet Take 1 tablet (20 mg total) by mouth daily.   finasteride (PROSCAR) 5 MG tablet Take 0.5 tablets (2.5 mg total) by mouth daily.   hydrochlorothiazide (HYDRODIURIL) 25 MG tablet Take 1 tablet (25 mg total) by mouth daily as  needed.   levothyroxine (SYNTHROID) 125 MCG tablet Take 1 tablet (125 mcg total) by mouth daily before breakfast. 2 days per week.   meloxicam (MOBIC) 15 MG tablet Take 1 tablet (15 mg total) by mouth daily.   Multiple Vitamins-Minerals (MULTIVITAMIN WITH MINERALS) tablet Take 1 tablet by mouth daily.   ondansetron (ZOFRAN) 4 MG tablet Take 1 tablet (4 mg total) by mouth every 8 (eight) hours as needed for nausea or vomiting.   pantoprazole (PROTONIX) 20 MG tablet Take 1 tablet (20 mg total) by mouth daily.   Plecanatide (TRULANCE) 3 MG TABS Take 1 tablet (3 mg total) by mouth daily.   pregabalin (LYRICA) 150 MG capsule Take 1 capsule (150 mg total) by mouth 3 (three) times daily.   sennosides-docusate sodium (SENOKOT-S) 8.6-50 MG tablet Take 1 tablet by mouth daily as needed for constipation.   SYNTHROID 112 MCG tablet Take 1 tablet (112 mcg total) by mouth daily before breakfast.   tiZANidine (ZANAFLEX) 4 MG tablet Take 1 tablet (4 mg total) by mouth at bedtime.   traZODone (DESYREL) 50 MG tablet Take 0.5-2 tablets (25-100 mg total) by mouth at bedtime.   venlafaxine XR (EFFEXOR XR) 75 MG 24 hr capsule  Take 1 capsule (75 mg total) by mouth daily with breakfast.   [DISCONTINUED] escitalopram (LEXAPRO) 10 MG tablet 5 mg daily for 4 days then stop   [DISCONTINUED] venlafaxine XR (EFFEXOR XR) 37.5 MG 24 hr capsule Take 1 capsule (37.5 mg total) by mouth daily for 3 days then switch to the higher dose   No facility-administered medications prior to visit.    ROS        Objective:     BP 104/71   Pulse 73   Temp 98.3 F (36.8 C) (Oral)   Ht 5\' 3"  (1.6 m)   Wt 132 lb 9.6 oz (60.1 kg)   SpO2 100%   BMI 23.49 kg/m     Physical Exam Constitutional:      Appearance: Normal appearance.  HENT:     Head: Normocephalic and atraumatic.     Right Ear: Tympanic membrane, ear canal and external ear normal. There is no impacted cerumen.     Left Ear: Tympanic membrane, ear canal and  external ear normal. There is no impacted cerumen.     Nose: Nose normal.     Mouth/Throat:     Pharynx: Oropharynx is clear.  Eyes:     Extraocular Movements: Extraocular movements intact.     Conjunctiva/sclera: Conjunctivae normal.     Pupils: Pupils are equal, round, and reactive to light.  Neck:     Thyroid: No thyromegaly.  Cardiovascular:     Rate and Rhythm: Normal rate and regular rhythm.  Pulmonary:     Effort: Pulmonary effort is normal.     Breath sounds: Normal breath sounds.  Abdominal:     General: Bowel sounds are normal.     Palpations: Abdomen is soft.     Tenderness: There is no abdominal tenderness.  Musculoskeletal:        General: No swelling.     Cervical back: Neck supple. No tenderness.  Lymphadenopathy:     Cervical: No cervical adenopathy.  Skin:    General: Skin is warm and dry.  Neurological:     Mental Status: She is alert and oriented to person, place, and time.  Psychiatric:        Mood and Affect: Mood normal.        Behavior: Behavior normal.      No results found for any visits on 04/13/23.     Assessment & Plan:    Routine Health Maintenance and Physical Exam  Immunization History  Administered Date(s) Administered   Influenza Split 01/16/2012, 11/21/2014   Influenza, Quadrivalent, Recombinant, Inj, Pf 12/24/2021   Influenza, Seasonal, Injecte, Preservative Fre 12/23/2022   Influenza,inj,Quad PF,6+ Mos 11/29/2016, 12/05/2017, 11/29/2019   Influenza-Unspecified 12/21/2018, 01/05/2021   PFIZER(Purple Top)SARS-COV-2 Vaccination 10/12/2019, 11/02/2019   Td 02/22/2008   Tdap 02/22/2008, 04/28/2017    Health Maintenance  Topic Date Due   COVID-19 Vaccine (3 - 2024-25 season) 04/12/2025 (Originally 11/14/2022)   Cervical Cancer Screening (HPV/Pap Cotest)  06/04/2024   Fecal DNA (Cologuard)  09/27/2024   DTaP/Tdap/Td (4 - Td or Tdap) 04/29/2027   INFLUENZA VACCINE  Completed   Hepatitis C Screening  Completed   HIV Screening   Completed   HPV VACCINES  Aged Out   Colonoscopy  Discontinued    Discussed health benefits of physical activity, and encouraged her to engage in regular exercise appropriate for her age and condition.  Problem List Items Addressed This Visit       Digestive   Chronic constipation   Trulance  is going to be $300 with new insurance. Using senakot too.  Consider trial of lliquid mag citrate on weekends and maybe tabs daily.          Endocrine   Hypothyroidism   Recent TSH perfect!!         Other   Primary insomnia   Trazodone working well. Will work on improving pain levels at night as well.       MDD (major depressive disorder), recurrent episode (HCC)   Flowsheet Row Office Visit from 04/13/2023 in [facility] at [facility]  PHQ-9 Total Score 14         04/13/2023   12:16 PM 12/15/2022   11:58 AM 01/21/2022    2:00 PM 08/14/2020   11:59 AM  GAD 7 : Generalized Anxiety Score  Nervous, Anxious, on Edge 2 0 2 0  Control/stop worrying 1 0 1 0  Worry too much - different things 2 1 2  0  Trouble relaxing 1 1 3  0  Restless 1 0 0 0  Easily annoyed or irritable 3 3 3 2   Afraid - awful might happen 0 0 0 0  Total GAD 7 Score 10 5 11 2   Anxiety Difficulty Somewhat difficult Not difficult at all Somewhat difficult Not difficult at all   Chronic fatigue and energy contributing as well.  Recently changed to Effexor.        Relevant Medications   diazepam (VALIUM) 5 MG tablet   Chronic neck pain   Attention deficit hyperactivity disorder (ADHD), combined type   Try Vyvanse 30mg . New rx sent. F/U 1 mo       Other Visit Diagnoses       Wellness examination    -  Primary       Keep up a regular exercise program and make sure you are eating a healthy diet Try to eat 4 servings of dairy a day, or if you are lactose intolerant take a calcium with vitamin D daily.  Your vaccines are up to date.   No follow-ups on file.      Nani Gasser, MD

## 2023-04-14 ENCOUNTER — Other Ambulatory Visit (HOSPITAL_BASED_OUTPATIENT_CLINIC_OR_DEPARTMENT_OTHER): Payer: Self-pay

## 2023-04-14 ENCOUNTER — Other Ambulatory Visit (HOSPITAL_COMMUNITY): Payer: Self-pay

## 2023-04-14 ENCOUNTER — Other Ambulatory Visit: Payer: Self-pay

## 2023-04-20 ENCOUNTER — Encounter: Payer: Self-pay | Admitting: Physical Therapy

## 2023-04-20 ENCOUNTER — Ambulatory Visit: Payer: 59 | Admitting: Professional

## 2023-04-20 ENCOUNTER — Ambulatory Visit: Payer: 59 | Attending: Sports Medicine | Admitting: Physical Therapy

## 2023-04-20 ENCOUNTER — Encounter: Payer: Self-pay | Admitting: Professional

## 2023-04-20 DIAGNOSIS — J302 Other seasonal allergic rhinitis: Secondary | ICD-10-CM | POA: Diagnosis not present

## 2023-04-20 DIAGNOSIS — F902 Attention-deficit hyperactivity disorder, combined type: Secondary | ICD-10-CM

## 2023-04-20 DIAGNOSIS — J300 Vasomotor rhinitis: Secondary | ICD-10-CM | POA: Diagnosis not present

## 2023-04-20 DIAGNOSIS — R293 Abnormal posture: Secondary | ICD-10-CM | POA: Insufficient documentation

## 2023-04-20 DIAGNOSIS — J988 Other specified respiratory disorders: Secondary | ICD-10-CM | POA: Diagnosis not present

## 2023-04-20 DIAGNOSIS — F331 Major depressive disorder, recurrent, moderate: Secondary | ICD-10-CM | POA: Diagnosis not present

## 2023-04-20 DIAGNOSIS — M25512 Pain in left shoulder: Secondary | ICD-10-CM | POA: Insufficient documentation

## 2023-04-20 DIAGNOSIS — M6281 Muscle weakness (generalized): Secondary | ICD-10-CM | POA: Diagnosis present

## 2023-04-20 NOTE — Progress Notes (Signed)
 [facility] Counselor/Therapist Progress Note  Patient ID: April Webster, MRN: [id number],    Date: 04/20/2023  Time Spent: 44 minutes [id number]  Treatment Type: Individual Therapy  Risk Assessment: Danger to Self:  No Self-injurious Behavior: No Danger to Others: No  Subjective:  This session was held via video teletherapy. The patient consented to video teletherapy and was located in her car during this session. She is aware it is the responsibility of the patient to secure confidentiality on her end of the session. The provider was in a private home office for the duration of this session.    The patient arrived on time for her Caregility session.  Issues addressed: 1-homework-completed -self-care -has been using music and frequencies to calm, crocheting. 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 -she is overwhelmed as he always has ear buds blasting music -the student is proud and is quick to make a decision -he spends excess times reviewing everything and puts her behind -he is receptive to feedback -she has started a feedback note that she reviews weekly -having a student slows her down so much -she runs behind Monday and Tuesday up to an hour -pt continues coming in to work early to complete pre-charting 3-physical -injuries include shoulder and she is in PT -chronic recurrent infections   -she has been to infectious disease   -MD says she doesn't have a fever and therefore she is receiving false positives -she will be seeing an immunologist today to see if they can see why she is continuously ill -she masks continuously but  -chronic pain responsibilities -cannot work out because she feels so poorly that she has gained ten pounds 4-medication -trying new medication for mood -she was using alcohol to cope and they are now trying low dose valium  for muscle relaxing and sleep   -she took .35 mg  Treatment Plan Problems Addressed   Intimate Relationship Conflicts, Low Self-Esteem, Unipolar Depression, Vocational Stress  Goals 1. Alleviate depressive symptoms and return to previous level of effective functioning. Objective Identify and replace thoughts and beliefs that support depression. Target Date: 2023-11-05 Frequency: Biweekly  Progress: 40 Modality: individual  Related Interventions Conduct Cognitive-Behavioral Therapy (see Cognitive Behavior Therapy by Almarie; Overcoming Depression by Marine dunker al.), beginning with helping the client learn the connection among cognition, depressive feelings, and actions. Assign the client to self-monitor thoughts, feelings, and actions in daily journal (e.g., Negative Thoughts Trigger Negative Feelings in the Adult Psychotherapy Homework Planner by Jenniffer; Daily Record of Dysfunctional Thoughts in Cognitive Therapy of Depression by Almarie Candida Gentry and Shona); process the journal material to challenge depressive thinking patterns and replace them with reality-based thoughts. Assign behavioral experiments in which depressive automatic thoughts are treated as hypotheses/prediction, reality-based alternative hypotheses/prediction are generated, and both are tested against the client's past, present, and/or future experiences. Facilitate and reinforce the client's shift from biased depressive self-talk and beliefs to reality-based cognitive messages that enhance self-confidence and increase adaptive actions (see Positive Self-Talk in the Adult Psychotherapy Homework Planner by Jenniffer). Objective Learn and implement behavioral strategies to overcome depression. Target Date: 2023-11-05 Frequency: Biweekly  Progress: 60 Modality: individual  Related Interventions Engage the client in behavioral activation, increasing his/her activity level and contact with sources of reward, while identifying processes that inhibit activation (see Behavioral Activation for Depression by Loleta,  Dimidjian, and Herman-Dunn; or assign Identify and Schedule Pleasant Activities in the Adult Psychotherapy Homework Planner by Jenniffer); use behavioral techniques such as instruction, rehearsal, role-playing,  role reversal, as needed, to facilitate activity in the client's daily life; reinforce success. Assist the client in developing skills that increase the likelihood of deriving pleasure from behavioral activation (e.g., assertiveness skills, developing an exercise plan, less internal/more external focus, increased social involvement); reinforce success. Objective Learn and implement problem-solving and decision-making skills. Target Date: 2023-11-05 Frequency: Biweekly  Progress: 60 Modality: individual  Related Interventions Conduct Problem-Solving Therapy (see Problem-Solving Therapy by Francisco and Nezu) using techniques such as psychoeducation, modeling, and role-playing to teach client problem-solving skills (i.e., defining a problem specifically, generating possible solutions, evaluating the pros and cons of each solution, selecting and implementing a plan of action, evaluating the efficacy of the plan, accepting or revising the plan); role-play application of the problem-solving skill to a real life issue (or assign Applying Problem-Solving to Interpersonal Conflict in the Adult Psychotherapy Homework Planner by Jenniffer). Objective Learn and implement conflict resolution skills to resolve interpersonal problems. Target Date: 2023-11-05 Frequency: Biweekly  Progress: 50 Modality: individual  Related Interventions Teach conflict resolution skills (e.g., empathy, active listening, I messages, respectful communication, assertiveness without aggression, compromise); use psychoeducation, modeling, role-playing, and rehearsal to work through several current conflicts; assign homework exercises; review and repeat so as to integrate their use into the client's life. Help the client resolve  depression related to interpersonal problems through the use of reassurance and support, clarification of cognitive and affective triggers that ignite conflicts, and active problem-solving (or assign Applying Problem-Solving to Interpersonal Conflict in the Adult Psychotherapy Homework Planner by Jenniffer). Objective Verbalize an understanding of healthy and unhealthy emotions with the intent of increasing the use of healthy emotions to guide actions. Target Date: 2023-11-05 Frequency: Biweekly  Progress: 50 Modality: individual  Related Interventions Use a process-experiential approach consistent with Emotion-Focused Therapy to create a safe, nurturing environment in which the client can process emotions, learning to identify and regulate unhealthy feelings and to generate more adaptive ones that then guide actions (see Emotion-Focused Therapy for Depression by Audrey armin Portugal). 2. Develop healthy thinking patterns and beliefs about self, others, and the world that lead to the alleviation and help prevent the relapse of depression. 3. Develop the necessary skills for effective, open communication, mutually satisfying sexual intimacy, and enjoyable time for companionship within the relationship. 4. Elevate self-esteem. Objective Increase insight into the historical and current sources of low self-esteem. Target Date: 2023-11-05 Frequency: Biweekly  Progress: 50 Modality: individual  Related Interventions Help the client become aware of his/her fear of rejection and its connection with past rejection or abandonment experiences; begin to contrast past experiences of pain with present experiences of acceptance and competence. Objective Identify and replace negative self-talk messages used to reinforce low self-esteem. Target Date: 2023-11-05 Frequency: Biweekly  Progress: 30 Modality: individual  Related Interventions Help the client identify his/her distorted, negative beliefs about self and  the world and replace these messages with more realistic, affirmative messages (or assign Journal and Replace Self-Defeating Thoughts in the Adult Psychotherapy Homework Planner by [facility] or read What to Say When You Talk to Yourself by Helmstetter). Ask the client to complete and process self-esteem-building exercises from recommended self-help books (e.g., Ten Days to Self Esteem! by Geofm; The Self-Esteem Companion by Fernande Rummer, [place], and [employer]; 10 Simple Solutions for [employer] by [employer]). Objective Identify and engage in activities that would improve self-image by being consistent with one's values. Target Date: 2023-11-05 Frequency: Biweekly  Progress: 50 Modality: individual  Related Interventions Help the client analyze his/her values and  the congruence or incongruence between them and the client's daily activities. Identify and assign activities congruent with the client's values; process them toward improving self-concept and self-esteem. Objective Increase the frequency of assertive behaviors. Target Date: 2023-11-05 Frequency: Biweekly  Progress: 70 Modality: individual  Related Interventions Train the client in assertiveness or refer him/her to a group that will educate and facilitate assertiveness skills via lectures and assignments. Objective Demonstrate an increased ability to identify and express personal feelings. Target Date: 2023-11-05 Frequency: Biweekly  Progress: 60 Modality: individual  Related Interventions Assign the client to keep a journal of feelings on a daily basis. Objective Articulate a plan to be proactive in trying to get identified needs met. Target Date: 2023-11-05 Frequency: Biweekly  Progress: 60 Modality: individual  Related Interventions Assist the client in identifying and verbalizing his/her needs, met and unmet. Assist the client in developing a specific action plan to get each need met (or assign Satisfying Unmet  Emotional Needs in the Adult Psychotherapy Homework Planner by Jenniffer). Objective Decrease the frequency of negative self-descriptive statements and increase frequency of positive self-descriptive statements. Target Date: 2023-11-05 Frequency: Biweekly  Progress: 30 Modality: individual  Related Interventions Assist the client in becoming aware of how he/she expresses or acts out negative feelings about himself/herself. Help the client reframe his/her negative assessment of himself/herself. 5. Establish an inward sense of self-worth, confidence, and competence. 6. Increase awareness of own role in the relationship conflicts. Objective Identify problems and strengths in the relationship, including one's own role in each. Target Date: 2023-11-05 Frequency: Biweekly  Progress: 50 Modality: individual  Related Interventions Assess current, ongoing problems in the relationship, including possible abuse/neglect, substance use, communication, conflict resolution, as well as home environment (if domestic violence is present, plan for safety and avoid early use of conjoint sessions; see the Physical Abuse chapter in The Couples Psychotherapy Treatment Planner by Valorie Caroli, and Jongsma). Assess strengths in the relationship that could be enhanced during the therapy to facilitate the accomplishment of therapeutic goals. Objective Make a commitment to change specific behaviors that have been identified by self or the partner. Target Date: 2023-11-05 Frequency: Biweekly  Progress: 30 Modality: individual  Related Interventions Process the list of positive and problematic features of each partner and the relationship; ask couple to agree to work on changes he/she needs to make to improve the relationship, generating a list of targeted changes (or assign How Can We Meet Each Other's Needs and Desires? in the Adult Psychotherapy Homework Planner by Jenniffer). Objective Increase the frequency of the  direct expression of honest, respectful, and positive feelings and thoughts within the relationship. Target Date: 2023-11-05 Frequency: Biweekly  Progress: 50 Modality: individual  Related Interventions Assist the couple in identifying conflicts that can be addressed using communication, conflict-resolution, and/or problem-solving skills (see Behavioral Marital Therapy by Holtzworth-Munroe and Jacobson). Use behavioral techniques (education, modeling, role-playing, corrective feedback, and positive reinforcement) to teach communication skills including assertive communication, offering positive feedback, active listening, making positive requests of others for behavior change, and giving negative feedback in an honest and respectful manner. Objective Learn and implement problem-solving and conflict resolution skills. Target Date: 2023-11-05 Frequency: Biweekly  Progress: 60 in general; 50 in relationship Modality: individual  Related Interventions Review how newly learned communication skills can be applied to conflict resolution through calm, respectful, effective dialogue; role-play application of this skill to a present conflict situation. Use behavioral techniques (education, modeling, role-playing, corrective feedback, and positive reinforcement) to teach the couple problem-solving and conflict  resolution skills including defining the problem constructively and specifically, brainstorming options, evaluating options, compromise, choosing options and implementing a plan, evaluating the results. Objective Understand the origin of each other's negative emotions and reactions and develop more constructive interactions that fill needs. Target Date: 2023-11-05 Frequency: Biweekly  Progress: 40 Modality: individual  Related Interventions Encourage the clients to recognize, reframe, and express these insecurities toward resolving negative emotional and behavioral reactions. Assist the clients in  developing more constructive interactions that satisfy attachment needs such as increased intimacy and expressions of love (or assign How Can We Meet Each Other's Needs and Desires? in the Adult Psychotherapy Homework Planner by Jenniffer). Objective Increase time spent in enjoyable contact with the partner. Target Date: 2023-11-05 Frequency: Biweekly  Progress: 60 Modality: individual  Related Interventions Assist the couple in identifying and planning rewarding social/recreational activities that can be shared with the partner (or assign Identify and Schedule Pleasant Activities in the Adult Psychotherapy Homework Planner by Jenniffer). Objective Identify any patterns of destructive and/or abusive behavior in the relationship. Target Date: 2023-11-05 Frequency: Biweekly  Progress: 50 Modality: individual  Related Interventions Assess current patterns of destructive and/or abusive behavior for each partner, including those that existed in each family of origin (if domestic violence is present, plan for safety and avoid early use of conjoint sessions; see the Physical Abuse chapter in The Couples Psychotherapy Treatment Planner by Valorie Caroli, and Jongsma). 7. Increase job satisfaction and performance due to implementation of assertiveness and stress management strategies. 8. Increase sense of confidence and competence in dealing with work responsibilities. Objective Implement assertiveness skills. Target Date: 2023-11-05 Frequency: Biweekly  Progress: 60 Modality: individual  Related Interventions Train the client in assertiveness skills or refer to assertiveness training class that teaches effective communication of needs and feelings without aggression or defensiveness. Objective Learn and implement problem-solving skills. Target Date: 2023-11-05 Frequency: Biweekly  Progress: 70 Modality: individual  Related Interventions Conduct Problem-Solving Therapy (see Problem-Solving Therapy  by Francisco and Nezu) using techniques such as psychoeducation, modeling, and role-playing to teach the client problem-solving skills (i.e., defining a problem specifically, generating possible solutions, evaluating the pros and cons of each solution, selecting and implementing a plan of action, evaluating the efficacy of the plan, accepting or revising the plan); role-play application of the problem-solving skill to a real life issue (or assign Applying Problem-Solving to Interpersonal Conflict in the Adult Psychotherapy Homework Planner by Jenniffer). Objective Verbalize healthy, realistic cognitive messages that promote harmony with others, self-acceptance, and self-confidence. Target Date: 2023-11-05 Frequency: Biweekly  Progress: 40 Modality: individual  Related Interventions Teach the client the connection between thoughts, feelings, and behavior; train the client in the development of more realistic, healthy cognitive messages that relieve anxiety and depression. Objective Learn and implement calming skills to reduce overall anxiety and manage anxiety symptoms. Target Date: 2023-11-05 Frequency: Biweekly  Progress: 60 Modality: individual  Related Interventions Teach the client calming/relaxation skills (e.g., applied relaxation, progressive muscle relaxation, cue controlled relaxation, mindful breathing, biofeedback) and how to discriminate better between relaxation and tension; teach the client how to apply these skills to his/her daily life (e.g., New Directions in Progressive Muscle Relaxation by Thornell Collier, and Hazlett-Stevens; The Relaxation and Stress Reduction Workbook by Nicholaus Aw, and [place]). Objective Identify any personal problems that may be causing conflict in the employment setting. Target Date: 2023-11-05 Frequency: Biweekly  Progress: 50 Modality: individual  Related Interventions Explore the client's transfer of personal problems to the employment  situation. Objective Identify the effect  that vocational stress has on feelings toward self and relationships with significant others. Target Date: 2023-11-05 Frequency: Biweekly  Progress: 70 Modality: individual  Related Interventions Explore the effect of the client's vocational stress on his/her intra- and interpersonal dynamics with friends and family. Objective Develop and verbalize a plan for constructive action to reduce vocational stress. Target Date: 2023-11-05 Frequency: Biweekly  Progress: 50 Modality: individual  Related Interventions Assist the client in developing a plan to react positively to his/her vocational situation (or assign My Vocational Action Plan in the Adult Psychotherapy Homework Planner by [employer]); process the proactive plan and assist in its implementation.  Diagnosis:Moderate episode of recurrent major depressive disorder (HCC)  Attention deficit hyperactivity disorder (ADHD), combined type  Plan:  -next session will be on Wednesday, May 04, 2023 at 1pm.

## 2023-04-20 NOTE — Therapy (Signed)
 OUTPATIENT PHYSICAL THERAPY TREATMENT    Patient Name: April Webster MRN: [id number] DOB:09/28/1975, 48 y.o., female Today's Date: 04/20/2023  END OF SESSION:  PT End of Session - 04/20/23 0714     Visit Number 5    Number of Visits 9    Date for PT Re-Evaluation 05/18/23    Authorization Type [employer] employee [employer]    PT Start Time 0715    PT Stop Time 0756    PT Time Calculation (min) 41 min    Activity Tolerance Patient tolerated treatment well                 Past Medical History:  Diagnosis Date   Chronic idiopathic constipation    DDD (degenerative disc disease), cervical and lumbar    Female pattern hair loss    Graves disease    Hyperlipidemia    Hypertension    Hypothyroidism    Lichen sclerosus    Raynaud's disease    Past Surgical History:  Procedure Laterality Date   ABDOMINOPLASTY     AUGMENTATION MAMMAPLASTY Bilateral 05/2019   Silicone    CHOLECYSTECTOMY     DILITATION & CURRETTAGE/HYSTROSCOPY WITH NOVASURE ABLATION N/A 02/09/2022   Procedure: DILATATION & CURETTAGE/HYSTEROSCOPY WITH NOVASURE ABLATION, REMOVAL OF IUD;  Surgeon: Cleatus Moccasin, MD;  Location: [facility] OR;  Service: Gynecology;  Laterality: N/A;   PLACEMENT OF BREAST IMPLANTS     THYROID  SURGERY     TUBAL LIGATION     Patient Active Problem List   Diagnosis Date Noted   Chronic neck pain 04/13/2023   Recurrent infections 03/02/2023   Impingement syndrome, shoulder, left 12/21/2022   Attention deficit hyperactivity disorder (ADHD), combined type 07/20/2022   Primary insomnia 09/09/2021   Chronic pain of right knee 08/12/2021   LGSIL on Pap smear of cervix 07/01/2020   Raynaud's disease 02/18/2020   IC (interstitial cystitis) 02/18/2020   Chronic constipation 02/18/2020   DDD (degenerative disc disease), cervical 12/14/2019   Gastroesophageal reflux disease 05/16/2019   Hypothyroidism 03/19/2019   Hyperlipidemia 03/19/2019   Facet syndrome, lumbar 03/19/2019   Female pattern  hair loss 03/19/2019   Lichen sclerosus 03/19/2019   Upper airway cough syndrome 03/19/2019   IUD (intrauterine device) in place 12/06/2016   MDD (major depressive disorder), recurrent episode (HCC) 05/10/2011    PCP: Alvan Dorothyann BIRCH, MD  REFERRING PROVIDER: Curtis Debby PARAS, MD  REFERRING DIAG: [phone] (ICD-10-CM) - Impingement syndrome, shoulder, left  THERAPY DIAG:  Left shoulder pain, unspecified chronicity  Muscle weakness (generalized)  Abnormal posture  Rationale for Evaluation and Treatment: Rehabilitation  ONSET DATE: 3-4 months   SUBJECTIVE:  Per eval - Pt endorses history of multiple illnesses over past couple of years which has affected her exercise routine. States she was beginning to exercise more, was trying to progress her overhead presses and felt an audible pull in her L shoulder. States that it was not painful but the next day felt sharp/stabbing pain in lateral/anterior shoulder. Trial of NSAIDs and home exercises without significant change. Pt states that she ended up getting steroid injection which gave her a few weeks of relief, but in early December was reaching forward, felt a sharp pain and has been exacerbated since. She notes difficulty with general movement of LUE but also endorses intermittent sharp pains while sitting unsupported. Difficulty sleeping as well, waking 1-2x/night on average. Does endorse some numbness in digits 4 and 5 - states this was occurring prior to shoulder pain but does seem more prominent now.  Endorses intermittent headaches. Denies any visual changes, swallowing/speech issues. No fevers. Endorses some night sweats she attributes to menopause. No BIL numbness aside from raynaud's history.  Hand dominance: Right  SUBJECTIVE STATEMENT: 04/20/2023  posterior shoulder pain improving but still feels tight when reaching. Having about the same amount of anterior pain, lateral pec. Distal symptoms doing okay this morning. Did okay after last session, symptoms still fluctuating. HEP going well but still having difficulty overhead.    PERTINENT HISTORY: See above PMH  PAIN:  Are you having pain: 3-4/10 anterior shoulder  Per eval -  Location/description: L lateral/anterior shoulder. Posterior at times. Sometimes referring distally into digits 4 and 5 Best-worst over past week: 0-9/10  - aggravating factors: movements, difficulty reaching behind back, seat belts, holding items - Easing factors: hot bath    PRECAUTIONS: None   WEIGHT BEARING RESTRICTIONS: No  FALLS:  Has patient fallen in last 6 months? Reports frequent near falls, reports 1 major fall back in April   LIVING ENVIRONMENT: 2 story house 1 STE, bed/bath upstairs, bathroom downstairs. 14 stairs to second floor Lives w/ husband, 2 kids, 4 dogs, 2 snakes  OCCUPATION: Nurse Practitioner  PLOF: Independent  PATIENT GOALS: be able to move arm better, get back to strength training   NEXT MD VISIT: TBD   OBJECTIVE:  Note: Objective measures were completed at Evaluation unless otherwise noted.  DIAGNOSTIC FINDINGS:  02/28/23 L shoulder MRI IMPRESSION: 1. Mild supraspinatus and infraspinatus tendinosis without a tear. 2.  No acute osseous injury of the left shoulder.  PATIENT SURVEYS:  FOTO 54 > 67  FOTO 04/20/23: 52   COGNITION: Overall cognitive status: Within functional limits for tasks assessed     SENSATION: Light touch intact B UE   POSTURE: Increased L UT elevation, rounded shoulders, tendency towards mildly guarded posture L UE  UPPER EXTREMITY ROM:  A/PROM Right eval Left eval Left 04/13/23  Shoulder flexion 162 150 deg  164 deg   Shoulder abduction 170 105 deg * 108 deg *  Shoulder internal rotation (functional combo) T5 L3 *   Shoulder  external rotation (functional combo) T2 C5 *   Elbow flexion     Elbow extension     Wrist flexion     Wrist extension      (Blank rows = not tested) (Key: WFL = within functional limits not formally assessed, * = concordant pain, s = stiffness/stretching sensation, NT = not tested)  Comments:    UPPER EXTREMITY MMT:  MMT Right eval Left eval  Shoulder flexion 5 4 *   Shoulder extension    Shoulder  abduction 5 4+  Shoulder extension    Shoulder internal rotation 5 4 *  Shoulder external rotation 5 4  Elbow flexion 5 4+   Elbow extension 5 4+  Grip strength    (Blank rows = not tested)  (Key: WFL = within functional limits not formally assessed, * = concordant pain, s = stiffness/stretching sensation, NT = not tested)  Comments:   SHOULDER SPECIAL TESTS: Positive neers on L   JOINT MOBILITY TESTING:  deferred  PALPATION:  Significant concordant TTP L bicipital groove, infraspinatus/teres. Mild tenderness L deltoid, rhomboid, UT/LS                                                                                                                             TREATMENT DATE:  [facility] Adult PT Treatment:                                                DATE: 04/20/23 Therapeutic Exercise: Reclined bicep curl 5# w/ towel prop 2x8 Doorway pec stretch 2x30sec cues for positioning and comfort, trials for different levels of elevation for pt comfort Tricep pull down green band 2x8 Green band high>low row x12 HEP update + education  Manual Therapy: Supine; passive pin and stretch bicep, lateral pec, subscap; STM anterior deltoid and bicep    [facility] Adult PT Treatment:                                                DATE: 04/13/23 Therapeutic Exercise: Double band ER band at wrist, green, 2x10 Rhomboid stretch 3x30sec LUE Green band bicep curl to 90 deg x12 Green band high>low row 2x8  HEP update + education/handout  Manual Therapy: Seated; STM to L rhomboid, middle trap, upper trap.  Trigger point release L LS and rhomboid Supine; STM to biceps, anterior deltoid, middle deltoid, upper trap    [facility] Adult PT Treatment:                                                DATE: 04/06/23 Therapeutic Exercise: PT assisted eccentric shoulder press 3x5 LUE, last set with hands clasped Green band double ER iso 2x8 cues for positioning and comfortable tension Swiss ball flexion AAROM 2x10 cues for setup, appropriate positioning, and home setup HEP update + education/handout  Manual Therapy: Supine w/ elbow/shoulder supported; STM to L LS, UT, deltoid, biceps; passive pin and stretch L biceps; gentle trigger point release L LS, deltoid, and biceps Gentle PROM w/ light oscillations to reduce muscle guarding, L GH flexion/abduction  [facility] Adult PT Treatment:                                                DATE: 03/30/23 Therapeutic Exercise: Seated isometric elbow flexion 5sec 5 reps cues for alignment Standing shoulder flexion iso 2x5 w/ 5 sec hold  Standing shoulder abduction isometric 2x5 Supine L shoulder AROM x5 Supine shoulder flexion AAROM w/ dowel x8 HEP update + handout/education  Manual Therapy: Supine w/ pillow support for elbow; gentle STM (open palm technique) to L anterior deltoid, biceps (mostly proximal), lateral deltoid, UT, LS, infraspinatus; passive pin and stretch L biceps; trigger point release L UT   PATIENT EDUCATION: Education details: rationale for interventions, HEP  Person educated: Patient Education method: Explanation, Demonstration, Tactile cues, Verbal cues Education comprehension: verbalized understanding, returned demonstration, verbal cues required, tactile cues required, and needs further education     HOME EXERCISE PROGRAM: Access Code: [email or web address] URL: [email or web address] Date: 04/20/2023 Prepared by: Alm Jenny  Program Notes - can do swiss ball flexion standing, as done in clinic  Exercises - Seated Flexion Stretch  with Swiss Ball  - 2-3 x daily - 1 sets - 8-10 reps - Standing Single Arm Elbow Flexion with Resistance  - 2-3 x daily - 1 sets - 8 reps - Doorway Rhomboid Stretch  - 2-3 x daily - 1 sets - 1-3 reps - 30sec hold - Standing High Row with Anchored Resistance  - 2-3 x daily - 1 sets - 8 reps - Corner Pec Minor Stretch  - 2-3 x daily - 1 sets - 1-3 reps - 30sec hold  ASSESSMENT:  CLINICAL IMPRESSION: 04/20/2023 Pt arrives w/ 3-4/10 pain, no issues after last session. More anterior pain rather than posterior today, mild improvement with manual as above. Addition of pec stretch in doorway with modification for shoulder elevation height which improves tolerance, may benefit from additional soft tissue work pending response to stretch w/ HEP. No adverse events, able to tolerate progressions today with minimal symptom irritability. Recommend continuing along current POC in order to address relevant deficits and improve functional tolerance. Pt departs today's session in no acute distress, all voiced questions/concerns addressed appropriately from PT perspective.     Per eval - Patient is a 48 y.o. woman who was seen today for physical therapy evaluation and treatment for L shoulder pain ongoing 3-4 months. Reports difficulty with lifting, reaching, and sitting unsupported. On exam she demonstrates concordant limitations in L GH mobility, shoulder/elbow MMT, postural deficits, and TTP of GH musculature. HEP today w/ introduction of isometrics with aim of improving stability and comfort with muscular contractions. No adverse events, endorses some soreness on departure. Recommend trial of skilled PT to address aforementioned deficits with aim of improving functional tolerance and reducing pain with typical activities. Pt departs today's session in no acute distress, all voiced concerns/questions addressed appropriately from PT perspective.      OBJECTIVE IMPAIRMENTS: decreased activity tolerance, decreased  endurance, decreased mobility, decreased ROM, decreased strength, impaired UE functional use, improper body mechanics, postural dysfunction, and pain.   ACTIVITY LIMITATIONS: carrying, lifting, sitting, and reach over head  PARTICIPATION LIMITATIONS: meal prep, cleaning, laundry, community activity, and occupation  PERSONAL FACTORS: 3+ comorbidities: HTN, raynauds, depression, insomnia  are also affecting patient's functional outcome.   REHAB POTENTIAL: Good  CLINICAL DECISION MAKING: Stable/uncomplicated  EVALUATION COMPLEXITY: Low  GOALS:  SHORT TERM GOALS: Target date: 04/20/2023 Pt will demonstrate appropriate understanding and performance of initially prescribed HEP in order to facilitate improved independence with management of symptoms.  Baseline: HEP provided on eval 04/20/23: good HEP adherence reported Goal status: MET  2. Pt will score greater than or equal to 61 on FOTO in order to demonstrate improved perception of function due to symptoms.  Baseline: 54 04/20/23: 52 Goal status: ONGOING  LONG TERM GOALS: Target date: 05/18/2023 Pt will score 67 on FOTO in order to demonstrate improved perception of function due to symptoms. Baseline: 54 Goal status: INITIAL  2.  Pt will demonstrate at least 150 degrees of active L shoulder elevation in order to demonstrate improved tolerance to functional movement patterns such as reaching overhead.  Baseline: see ROM chart above Goal status: INITIAL  3.  Pt will demonstrate at least 4+/5 shoulder flex/abduction MMT on LUE for improved symmetry of UE strength and improved tolerance to functional movements.  Baseline: see MMT chart above Goal status: INITIAL  4. Pt will endorse waking up no more than 3-4x/week on average due to shoulder pain in order to improve overall health and QOL.  Baseline: 1-2x/night on average  Goal status: INITIAL  5. Pt will report at least 50% decrease in overall pain levels in past week in order to  facilitate improved tolerance to basic ADLs/mobility.   Baseline: 0-9/10  Goal status: INITIAL    6. Pt will demonstrate functional combo ER/IR within at least 1 vertebra of contralateral limb in order to facilitate improved functional UE mobility.   Baseline: see ROM chart above  Goal status: INITIAL   PLAN:  PT FREQUENCY: 1x/week  PT DURATION: 8 weeks  PLANNED INTERVENTIONS: 97164- PT Re-evaluation, 97110-Therapeutic exercises, 97530- Therapeutic activity, 97112- Neuromuscular re-education, 97535- Self Care, [id number]- Manual therapy, 97014- Electrical stimulation (unattended), Patient/Family education, Stair training, Taping, Joint mobilization, Spinal mobilization, Cryotherapy, and Moist heat  PLAN FOR NEXT SESSION: Review/update HEP PRN. Work on [employer] exercises as appropriate with emphasis on GH isometrics, PROM/AAROM for [facility] mobility initially. Periscapular/postural work within pt tolerance. Symptom modification strategies as indicated/appropriate.    Alm DELENA Jenny PT, DPT 04/20/2023 10:18 AM

## 2023-04-21 ENCOUNTER — Other Ambulatory Visit (HOSPITAL_BASED_OUTPATIENT_CLINIC_OR_DEPARTMENT_OTHER): Payer: Self-pay

## 2023-04-21 ENCOUNTER — Telehealth: Payer: Self-pay | Admitting: Family Medicine

## 2023-04-21 DIAGNOSIS — L03032 Cellulitis of left toe: Secondary | ICD-10-CM

## 2023-04-21 DIAGNOSIS — J302 Other seasonal allergic rhinitis: Secondary | ICD-10-CM | POA: Diagnosis not present

## 2023-04-21 MED ORDER — DOXYCYCLINE HYCLATE 100 MG PO TABS
100.0000 mg | ORAL_TABLET | Freq: Two times a day (BID) | ORAL | 0 refills | Status: DC
  Filled 2023-04-21: qty 14, 7d supply, fill #0

## 2023-04-21 NOTE — Telephone Encounter (Signed)
 Patient was picking at her toe about a week ago while in the bathtub.  Traumatize the lateral nail edge.  Since then has been red swollen and draining clear serous fluid.  She is been applying over-the-counter Neosporin.    Treat with doxycycline .  Call if not better after the weekend.  Continue warm salt soaks.  Meds ordered this encounter  Medications   doxycycline  (VIBRA -TABS) 100 MG tablet    Sig: Take 1 tablet (100 mg total) by mouth 2 (two) times daily.    Dispense:  14 tablet    Refill:  0

## 2023-04-22 ENCOUNTER — Other Ambulatory Visit (HOSPITAL_COMMUNITY): Payer: Self-pay

## 2023-04-24 ENCOUNTER — Other Ambulatory Visit: Payer: Self-pay

## 2023-04-25 ENCOUNTER — Other Ambulatory Visit (HOSPITAL_BASED_OUTPATIENT_CLINIC_OR_DEPARTMENT_OTHER): Payer: Self-pay

## 2023-04-25 ENCOUNTER — Other Ambulatory Visit: Payer: Self-pay | Admitting: Family Medicine

## 2023-04-25 ENCOUNTER — Other Ambulatory Visit: Payer: Self-pay

## 2023-04-25 MED ORDER — DOXYCYCLINE HYCLATE 100 MG PO TABS: 100.0000 mg | ORAL_TABLET | Freq: Two times a day (BID) | ORAL | 0 refills | Status: DC

## 2023-04-25 NOTE — Progress Notes (Signed)
 Meds ordered this encounter  Medications   doxycycline  (VIBRA -TABS) 100 MG tablet    Sig: Take 1 tablet (100 mg total) by mouth 2 (two) times daily.    Dispense:  14 tablet    Refill:  0   Resent to [facility]

## 2023-04-26 DIAGNOSIS — J988 Other specified respiratory disorders: Secondary | ICD-10-CM | POA: Diagnosis not present

## 2023-04-26 NOTE — Therapy (Signed)
 OUTPATIENT PHYSICAL THERAPY TREATMENT    Patient Name: April Webster MRN: [id number] DOB:02-26-1976, 48 y.o., female Today's Date: 04/27/2023  END OF SESSION:  PT End of Session - 04/27/23 1403     Visit Number 6    Number of Visits 9    Date for PT Re-Evaluation 05/18/23    Authorization Type [employer] employee [employer]    PT Start Time 1403    PT Stop Time 1448    PT Time Calculation (min) 45 min    Activity Tolerance Patient tolerated treatment well                  Past Medical History:  Diagnosis Date   Chronic idiopathic constipation    DDD (degenerative disc disease), cervical and lumbar    Female pattern hair loss    Graves disease    Hyperlipidemia    Hypertension    Hypothyroidism    Lichen sclerosus    Raynaud's disease    Past Surgical History:  Procedure Laterality Date   ABDOMINOPLASTY     AUGMENTATION MAMMAPLASTY Bilateral 05/2019   Silicone    CHOLECYSTECTOMY     DILITATION & CURRETTAGE/HYSTROSCOPY WITH NOVASURE ABLATION N/A 02/09/2022   Procedure: DILATATION & CURETTAGE/HYSTEROSCOPY WITH NOVASURE ABLATION, REMOVAL OF IUD;  Surgeon: Milas Hock, MD;  Location: [facility] OR;  Service: Gynecology;  Laterality: N/A;   PLACEMENT OF BREAST IMPLANTS     THYROID SURGERY     TUBAL LIGATION     Patient Active Problem List   Diagnosis Date Noted   Chronic neck pain 04/13/2023   Recurrent infections 03/02/2023   Impingement syndrome, shoulder, left 12/21/2022   Attention deficit hyperactivity disorder (ADHD), combined type 07/20/2022   Primary insomnia 09/09/2021   Chronic pain of right knee 08/12/2021   LGSIL on Pap smear of cervix 07/01/2020   Raynaud's disease 02/18/2020   IC (interstitial cystitis) 02/18/2020   Chronic constipation 02/18/2020   DDD (degenerative disc disease), cervical 12/14/2019   Gastroesophageal reflux disease 05/16/2019   Hypothyroidism 03/19/2019   Hyperlipidemia 03/19/2019   Facet syndrome, lumbar 03/19/2019   Female  pattern hair loss 03/19/2019   Lichen sclerosus 03/19/2019   Upper airway cough syndrome 03/19/2019   IUD (intrauterine device) in place 12/06/2016   MDD (major depressive disorder), recurrent episode (HCC) 05/10/2011    PCP: Agapito Games, MD  REFERRING PROVIDER: Monica Becton, MD  REFERRING DIAG: [phone] (ICD-10-CM) - Impingement syndrome, shoulder, left  THERAPY DIAG:  Left shoulder pain, unspecified chronicity  Muscle weakness (generalized)  Abnormal posture  Rationale for Evaluation and Treatment: Rehabilitation  ONSET DATE: 3-4 months   SUBJECTIVE:  Per eval - Pt endorses history of multiple illnesses over past couple of years which has affected her exercise routine. States she was beginning to exercise more, was trying to progress her overhead presses and felt an audible pull in her L shoulder. States that it was not painful but the next day felt sharp/stabbing pain in lateral/anterior shoulder. Trial of NSAIDs and home exercises without significant change. Pt states that she ended up getting steroid injection which gave her a few weeks of relief, but in early December was reaching forward, felt a sharp pain and has been exacerbated since. She notes difficulty with general movement of LUE but also endorses intermittent sharp pains while sitting unsupported. Difficulty sleeping as well, waking 1-2x/night on average. Does endorse some numbness in digits 4 and 5 - states this was occurring prior to shoulder pain but does seem more prominent now.  Endorses intermittent headaches. Denies any visual changes, swallowing/speech issues. No fevers. Endorses some night sweats she attributes to menopause. No BIL numbness aside from raynaud's history.  Hand dominance: Right  SUBJECTIVE  STATEMENT: 04/27/2023 fatigued today, shoulder is doing okay. Hasn't been as bad but still has some significant painwith reaching. Some anterior and lateral shoulder pain. Tried some light resistance work in [employer], some increase in pain for a couple hours. Hasn't had any distal symptoms in a while.     PERTINENT HISTORY: See above PMH  PAIN:  Are you having pain: 2-3/10  Per eval -  Location/description: L lateral/anterior shoulder. Posterior at times. Sometimes referring distally into digits 4 and 5 Best-worst over past week: 0-9/10  - aggravating factors: movements, difficulty reaching behind back, seat belts, holding items - Easing factors: hot bath    PRECAUTIONS: None   WEIGHT BEARING RESTRICTIONS: No  FALLS:  Has patient fallen in last 6 months? Reports frequent near falls, reports 1 major fall back in April   LIVING ENVIRONMENT: 2 story house 1 STE, bed/bath upstairs, bathroom downstairs. 14 stairs to second floor Lives w/ husband, 2 kids, 4 dogs, 2 snakes  OCCUPATION: Nurse Practitioner  PLOF: Independent  PATIENT GOALS: be able to move arm better, get back to strength training   NEXT MD VISIT: TBD   OBJECTIVE:  Note: Objective measures were completed at Evaluation unless otherwise noted.  DIAGNOSTIC FINDINGS:  02/28/23 L shoulder MRI "IMPRESSION: 1. Mild supraspinatus and infraspinatus tendinosis without a tear. 2.  No acute osseous injury of the left shoulder."  PATIENT SURVEYS:  FOTO 54 > 67  FOTO 04/20/23: 52   COGNITION: Overall cognitive status: Within functional limits for tasks assessed     SENSATION: Light touch intact B UE   POSTURE: Increased L UT elevation, rounded shoulders, tendency towards mildly guarded posture L UE  UPPER EXTREMITY ROM:  A/PROM Right eval Left eval Left 04/13/23  Shoulder flexion 162 150 deg  164 deg   Shoulder abduction 170 105 deg * 108 deg *  Shoulder internal rotation (functional combo) T5 L3 *   Shoulder  external rotation (functional combo) T2 C5 *   Elbow flexion     Elbow extension     Wrist flexion     Wrist extension      (Blank rows = not tested) (Key: WFL = within functional limits not formally assessed, * = concordant pain, s = stiffness/stretching sensation, NT = not tested)  Comments:    UPPER EXTREMITY MMT:  MMT Right eval Left eval  Shoulder flexion 5 4 *   Shoulder extension  Shoulder abduction 5 4+  Shoulder extension    Shoulder internal rotation 5 4 *  Shoulder external rotation 5 4  Elbow flexion 5 4+   Elbow extension 5 4+  Grip strength    (Blank rows = not tested)  (Key: WFL = within functional limits not formally assessed, * = concordant pain, s = stiffness/stretching sensation, NT = not tested)  Comments:   SHOULDER SPECIAL TESTS: Positive neers on L   JOINT MOBILITY TESTING:  deferred  PALPATION:  Significant concordant TTP L bicipital groove, infraspinatus/teres. Mild tenderness L deltoid, rhomboid, UT/LS                                                                                                                             TREATMENT DATE:  [facility] Adult PT Treatment:                                                DATE: 04/27/23 Therapeutic Exercise: Standing red band chest press 2x8 Supine pec stretch+ unresisted fly 2x8; initially short arc (0-45, progressing to 0-90) Green band double ER x12 GH abd pulse at ~45 deg and ~90, x8 green band cues for comfortable positioning/tension  Manual Therapy: Supine - STM L anterior deltoid, distal biceps, lateral pec major and pec minor ; trigger point release throughout aforementioned musculature ; passive pin and stretch w/ emphasis on elbow extension (biceps) and GH ER at varying degrees of abduction (pec major/minor) Supine - multiplanar passive physiological movement L GHJ all planes with gentle oscillations     [facility] Adult PT Treatment:                                                DATE:  04/20/23 Therapeutic Exercise: Reclined bicep curl 5# w/ towel prop 2x8 Doorway pec stretch 2x30sec cues for positioning and comfort, trials for different levels of elevation for pt comfort Tricep pull down green band 2x8 Green band high>low row x12 HEP update + education  Manual Therapy: Supine; passive pin and stretch bicep, lateral pec, subscap; STM anterior deltoid and bicep    [facility] Adult PT Treatment:                                                DATE: 04/13/23 Therapeutic Exercise: Double band ER band at wrist, green, 2x10 Rhomboid stretch 3x30sec LUE Green band bicep curl to 90 deg x12 Green band high>low row 2x8  HEP update + education/handout  Manual Therapy: Seated; STM to L rhomboid, middle trap, upper trap. Trigger point release  L LS and rhomboid Supine; STM to biceps, anterior deltoid, middle deltoid, upper trap    [facility] Adult PT Treatment:                                                DATE: 04/06/23 Therapeutic Exercise: PT assisted eccentric shoulder press 3x5 LUE, last set with hands clasped Green band double ER iso 2x8 cues for positioning and comfortable tension Swiss ball flexion AAROM 2x10 cues for setup, appropriate positioning, and home setup HEP update + education/handout  Manual Therapy: Supine w/ elbow/shoulder supported; STM to L LS, UT, deltoid, biceps; passive pin and stretch L biceps; gentle trigger point release L LS, deltoid, and biceps Gentle PROM w/ light oscillations to reduce muscle guarding, L GH flexion/abduction  PATIENT EDUCATION: Education details: rationale for interventions, HEP  Person educated: Patient Education method: Explanation, Demonstration, Tactile cues, Verbal cues Education comprehension: verbalized understanding, returned demonstration, verbal cues required, tactile cues required, and needs further education     HOME EXERCISE PROGRAM: Access Code: [email or web address] URL: [email or web address] Date:  04/20/2023 Prepared by: Fransisco Hertz  Program Notes - can do swiss ball flexion standing, as done in clinic  Exercises - Seated Flexion Stretch with Swiss Ball  - 2-3 x daily - 1 sets - 8-10 reps - Standing Single Arm Elbow Flexion with Resistance  - 2-3 x daily - 1 sets - 8 reps - Doorway Rhomboid Stretch  - 2-3 x daily - 1 sets - 1-3 reps - 30sec hold - Standing High Row with Anchored Resistance  - 2-3 x daily - 1 sets - 8 reps - Corner Pec Minor Stretch  - 2-3 x daily - 1 sets - 1-3 reps - 30sec hold  ASSESSMENT:  CLINICAL IMPRESSION: 04/27/2023 Pt arrives w/ 2-3/10 pain on NPS, mostly anterior/superior; notes distal symptoms improving notably, also having less posterior pain. On palpation she does have a trigger point in lateral pec major/minor that refers pain into posterior shoulder - modest improvement with trigger point release and passive pin and stretch, pt reports good transient relief during manual as above. Following this with exercises working on anterior deltoid and pecs, some initial discomfort but improves with repetition. Mild symptom irritability on departure in keeping with prior sessions, no adverse events. HEP update as above. Recommend continuing along current POC in order to address relevant deficits and improve functional tolerance. Pt departs today's session in no acute distress, all voiced questions/concerns addressed appropriately from PT perspective.     Per eval - Patient is a 48 y.o. woman who was seen today for physical therapy evaluation and treatment for L shoulder pain ongoing 3-4 months. Reports difficulty with lifting, reaching, and sitting unsupported. On exam she demonstrates concordant limitations in L GH mobility, shoulder/elbow MMT, postural deficits, and TTP of GH musculature. HEP today w/ introduction of isometrics with aim of improving stability and comfort with muscular contractions. No adverse events, endorses some soreness on departure. Recommend  trial of skilled PT to address aforementioned deficits with aim of improving functional tolerance and reducing pain with typical activities. Pt departs today's session in no acute distress, all voiced concerns/questions addressed appropriately from PT perspective.      OBJECTIVE IMPAIRMENTS: decreased activity tolerance, decreased endurance, decreased mobility, decreased ROM, decreased strength, impaired UE functional use, improper body mechanics, postural dysfunction, and pain.   ACTIVITY LIMITATIONS: carrying,  lifting, sitting, and reach over head  PARTICIPATION LIMITATIONS: meal prep, cleaning, laundry, community activity, and occupation  PERSONAL FACTORS: 3+ comorbidities: HTN, raynauds, depression, insomnia  are also affecting patient's functional outcome.   REHAB POTENTIAL: Good  CLINICAL DECISION MAKING: Stable/uncomplicated  EVALUATION COMPLEXITY: Low   GOALS:  SHORT TERM GOALS: Target date: 04/20/2023 Pt will demonstrate appropriate understanding and performance of initially prescribed HEP in order to facilitate improved independence with management of symptoms.  Baseline: HEP provided on eval 04/20/23: good HEP adherence reported Goal status: MET  2. Pt will score greater than or equal to 61 on FOTO in order to demonstrate improved perception of function due to symptoms.  Baseline: 54 04/20/23: 52 Goal status: ONGOING  LONG TERM GOALS: Target date: 05/18/2023 Pt will score 67 on FOTO in order to demonstrate improved perception of function due to symptoms. Baseline: 54 Goal status: INITIAL  2.  Pt will demonstrate at least 150 degrees of active L shoulder elevation in order to demonstrate improved tolerance to functional movement patterns such as reaching overhead.  Baseline: see ROM chart above Goal status: INITIAL  3.  Pt will demonstrate at least 4+/5 shoulder flex/abduction MMT on LUE for improved symmetry of UE strength and improved tolerance to functional movements.   Baseline: see MMT chart above Goal status: INITIAL  4. Pt will endorse waking up no more than 3-4x/week on average due to shoulder pain in order to improve overall health and QOL.  Baseline: 1-2x/night on average  Goal status: INITIAL  5. Pt will report at least 50% decrease in overall pain levels in past week in order to facilitate improved tolerance to basic ADLs/mobility.   Baseline: 0-9/10  Goal status: INITIAL    6. Pt will demonstrate functional combo ER/IR within at least 1 vertebra of contralateral limb in order to facilitate improved functional UE mobility.   Baseline: see ROM chart above  Goal status: INITIAL   PLAN:  PT FREQUENCY: 1x/week  PT DURATION: 8 weeks  PLANNED INTERVENTIONS: 97164- PT Re-evaluation, 97110-Therapeutic exercises, 97530- Therapeutic activity, 97112- Neuromuscular re-education, 97535- Self Care, 86578- Manual therapy, 97014- Electrical stimulation (unattended), Patient/Family education, Stair training, Taping, Joint mobilization, Spinal mobilization, Cryotherapy, and Moist heat  PLAN FOR NEXT SESSION: Review/update HEP PRN. Focus on anterior shoulder pain next couple visits - comfortable pressing movements, pec/deltoid. Begin to work on more functional/compound movements as able/appropriate. Symptom modification strategies as indicated/appropriate.    Ashley Murrain PT, DPT 04/27/2023 3:13 PM

## 2023-04-27 ENCOUNTER — Ambulatory Visit: Payer: 59 | Admitting: Physical Therapy

## 2023-04-27 ENCOUNTER — Encounter: Payer: Self-pay | Admitting: Physical Therapy

## 2023-04-27 DIAGNOSIS — M25512 Pain in left shoulder: Secondary | ICD-10-CM | POA: Diagnosis not present

## 2023-04-27 DIAGNOSIS — M6281 Muscle weakness (generalized): Secondary | ICD-10-CM | POA: Diagnosis not present

## 2023-04-27 DIAGNOSIS — R293 Abnormal posture: Secondary | ICD-10-CM

## 2023-05-03 ENCOUNTER — Other Ambulatory Visit: Payer: Self-pay

## 2023-05-03 ENCOUNTER — Ambulatory Visit (INDEPENDENT_AMBULATORY_CARE_PROVIDER_SITE_OTHER): Payer: 59 | Admitting: Sports Medicine

## 2023-05-03 DIAGNOSIS — M25512 Pain in left shoulder: Secondary | ICD-10-CM | POA: Diagnosis not present

## 2023-05-03 DIAGNOSIS — G8929 Other chronic pain: Secondary | ICD-10-CM | POA: Diagnosis not present

## 2023-05-03 DIAGNOSIS — M7542 Impingement syndrome of left shoulder: Secondary | ICD-10-CM | POA: Diagnosis not present

## 2023-05-03 NOTE — Progress Notes (Signed)
    Procedures performed today:    Procedure: Real-time Ultrasound Guided injection of the left subacromial bursa Device: Samsung HS60  Verbal informed consent obtained.  Time-out conducted.  Noted no overlying erythema, induration, or other signs of local infection.  Skin prepped in a sterile fashion.  Local anesthesia: Topical Ethyl chloride.  With sterile technique and under real time ultrasound guidance: Noted intact rotator cuff, 1 cc Kenalog 40, 1 cc lidocaine, 1 cc bupivacaine injected easily Completed without difficulty  Advised to call if fevers/chills, erythema, induration, drainage, or persistent bleeding.  Images permanently stored and available for review in PACS.  Impression: Technically successful ultrasound guided injection.  Procedure: Real-time Ultrasound Guided injection of the left proximal long head of the biceps sheath Device: Samsung HS60  Verbal informed consent obtained.  Time-out conducted.  Noted no overlying erythema, induration, or other signs of local infection.  Skin prepped in a sterile fashion.  Local anesthesia: Topical Ethyl chloride.  With sterile technique and under real time ultrasound guidance: Noted hypoechoic change in the biceps suspicious for split tearing, taking care to avoid intratendinous injection 1 cc Kenalog 40, 1 cc lidocaine, 1 cc bupivacaine injected easily Completed without difficulty  Advised to call if fevers/chills, erythema, induration, drainage, or persistent bleeding.  Images permanently stored and available for review in PACS.  Impression: Technically successful ultrasound guided injection.  Independent interpretation of notes and tests performed by another provider:   None.  Brief History, Exam, Impression, and Recommendations:    Chronic left shoulder pain April Webster returns, she is a very pleasant 48 year old female, she is a Publishing rights manager at our office, we have been treating her longitudinally for chronic left  shoulder pain, she had physical therapy, a subacromial injection, I did note a potential biceps tendon split tear on her initial ultrasound. Ultimately an MRI was obtained that did not confirm biceps split tearing, cuff tendinosis noted but no tears. She continues to have persistent pain that she localizes more anteriorly today. She does have a positive speeds test today, I did an injection into the biceps sheath with ultrasound guidance, we also did a subacromial injection, I am also going to set her up with a consult with Dr. Everardo Pacific. She did have more concordant pain with the biceps sheath injection and did report improvement immediately after the injection suggesting the biceps is the pain generator.    ____________________________________________ April Webster. April Webster, M.D., ABFM., CAQSM., AME. Primary Care and Sports Medicine  [facility]  Adjunct Professor of Family Medicine  [employer] [place]  Restaurant manager, fast food

## 2023-05-03 NOTE — Assessment & Plan Note (Signed)
 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 Ultimately an MRI was obtained that did not confirm biceps split tearing, cuff tendinosis noted but no tears. She continues to have persistent pain that she localizes more anteriorly today. She does have a positive speeds test today, I did an injection into the biceps sheath with ultrasound guidance, we also did a subacromial injection, I am also going to set her up with a consult with Dr. Everardo Pacific. She did have more concordant pain with the biceps sheath injection and did report improvement immediately after the injection suggesting the biceps is the pain generator.

## 2023-05-04 ENCOUNTER — Encounter: Payer: Self-pay | Admitting: Professional

## 2023-05-04 ENCOUNTER — Ambulatory Visit: Payer: 59 | Admitting: Physical Therapy

## 2023-05-04 ENCOUNTER — Ambulatory Visit: Payer: 59 | Admitting: Professional

## 2023-05-04 DIAGNOSIS — F902 Attention-deficit hyperactivity disorder, combined type: Secondary | ICD-10-CM

## 2023-05-04 DIAGNOSIS — F331 Major depressive disorder, recurrent, moderate: Secondary | ICD-10-CM

## 2023-05-04 NOTE — Progress Notes (Signed)
 [facility] Counselor/Therapist Progress Note  Patient ID: April Webster, MRN: [id number],    Date: 05/04/2023  Time Spent: 60 minutes [id number]  Treatment Type: Individual Therapy  Risk Assessment: Danger to Self:  No Self-injurious Behavior: No Danger to Others: No  Subjective:  This session was held via video teletherapy. The patient consented to video teletherapy and was located in her car during this session. She is aware it is the responsibility of the patient to secure confidentiality on her end of the session. The provider was in a private home office for the duration of this session.    The patient arrived on time for her Caregility session.  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 -he is making every visit a physical and going over complete history -consider asking for assistance with trying to manage two days weekly b-he is spending 80 minutes per pt and pends his note to wordsmith but then still has errors -she is having to go through each note and must addend all of his notes due to incomplete  -pt had 22 charts to sign this morning from Monday and Tuesday c-she admits that her student stated that he has been told this by other preceptors d-work/life balance is not good despite having taken two work days off -stream-lining work processes 2-physical -she had two injections in her arm yesterday -her DO thinks she might have a bicep tear and was referred for surgical evaluation -she has gotten a rolling cart to carry her computer and other necessary things and that has been helpful  Treatment Plan Problems Addressed  Intimate Relationship Conflicts, Low Self-Esteem, Unipolar Depression, Vocational Stress  Goals 1. Alleviate depressive symptoms and return to previous level of effective functioning. Objective Identify and replace thoughts and beliefs that support depression. Target Date: 2023-11-05 Frequency: Biweekly   Progress: 40 Modality: individual  Related Interventions Conduct Cognitive-Behavioral Therapy (see Cognitive Behavior Therapy by Reola Calkins; Overcoming Depression by Agapito Games al.), beginning with helping the client learn the connection among cognition, depressive feelings, and actions. Assign the client to self-monitor thoughts, feelings, and actions in daily journal (e.g., "Negative Thoughts Trigger Negative Feelings" in the Adult Psychotherapy Homework Planner by Stephannie Li; "Daily Record of Dysfunctional Thoughts" in Cognitive Therapy of Depression by Ashby Dawes and Shea Evans); process the journal material to challenge depressive thinking patterns and replace them with reality-based thoughts. Assign "behavioral experiments" in which depressive automatic thoughts are treated as hypotheses/prediction, reality-based alternative hypotheses/prediction are generated, and both are tested against the client's past, present, and/or future experiences. Facilitate and reinforce the client's shift from biased depressive self-talk and beliefs to reality-based cognitive messages that enhance self-confidence and increase adaptive actions (see "Positive Self-Talk" in the Adult Psychotherapy Homework Planner by Stephannie Li). Objective Learn and implement behavioral strategies to overcome depression. Target Date: 2023-11-05 Frequency: Biweekly  Progress: 60 Modality: individual  Related Interventions Engage the client in "behavioral activation," increasing his/her activity level and contact with sources of reward, while identifying processes that inhibit activation (see Behavioral Activation for Depression by Katharine Look, Dimidjian, and Herman-Dunn; or assign "Identify and Schedule Pleasant Activities" in the Adult Psychotherapy Homework Planner by [facility]); use behavioral techniques such as instruction, rehearsal, role-playing, role reversal, as needed, to facilitate activity in the client's daily life; reinforce  success. Assist the client in developing skills that increase the likelihood of deriving pleasure from behavioral activation (e.g., assertiveness skills, developing an exercise plan, less internal/more external focus, increased social involvement); reinforce success. Objective Learn  and implement problem-solving and decision-making skills. Target Date: 2023-11-05 Frequency: Biweekly  Progress: 60 Modality: individual  Related Interventions Conduct Problem-Solving Therapy (see Problem-Solving Therapy by Domenick Bookbinder and Rob Hickman) using techniques such as psychoeducation, modeling, and role-playing to teach client problem-solving skills (i.e., defining a problem specifically, generating possible solutions, evaluating the pros and cons of each solution, selecting and implementing a plan of action, evaluating the efficacy of the plan, accepting or revising the plan); role-play application of the problem-solving skill to a real life issue (or assign "Applying Problem-Solving to Interpersonal Conflict" in the Adult Psychotherapy Homework Planner by Stephannie Li). Objective Learn and implement conflict resolution skills to resolve interpersonal problems. Target Date: 2023-11-05 Frequency: Biweekly  Progress: 50 Modality: individual  Related Interventions Teach conflict resolution skills (e.g., empathy, active listening, "I messages," respectful communication, assertiveness without aggression, compromise); use psychoeducation, modeling, role-playing, and rehearsal to work through several current conflicts; assign homework exercises; review and repeat so as to integrate their use into the client's life. Help the client resolve depression related to interpersonal problems through the use of reassurance and support, clarification of cognitive and affective triggers that ignite conflicts, and active problem-solving (or assign "Applying Problem-Solving to Interpersonal Conflict" in the Adult Psychotherapy Homework Planner by  Stephannie Li). Objective Verbalize an understanding of healthy and unhealthy emotions with the intent of increasing the use of healthy emotions to guide actions. Target Date: 2023-11-05 Frequency: Biweekly  Progress: 50 Modality: individual  Related Interventions Use a process-experiential approach consistent with Emotion-Focused Therapy to create a safe, nurturing environment in which the client can process emotions, learning to identify and regulate unhealthy feelings and to generate more adaptive ones that then guide actions (see Emotion-Focused Therapy for Depression by Peter Minium). 2. Develop healthy thinking patterns and beliefs about self, others, and the world that lead to the alleviation and help prevent the relapse of depression. 3. Develop the necessary skills for effective, open communication, mutually satisfying sexual intimacy, and enjoyable time for companionship within the relationship. 4. Elevate self-esteem. Objective Increase insight into the historical and current sources of low self-esteem. Target Date: 2023-11-05 Frequency: Biweekly  Progress: 50 Modality: individual  Related Interventions Help the client become aware of his/her fear of rejection and its connection with past rejection or abandonment experiences; begin to contrast past experiences of pain with present experiences of acceptance and competence. Objective Identify and replace negative self-talk messages used to reinforce low self-esteem. Target Date: 2023-11-05 Frequency: Biweekly  Progress: 30 Modality: individual  Related Interventions Help the client identify his/her distorted, negative beliefs about self and the world and replace these messages with more realistic, affirmative messages (or assign "Journal and Replace Self-Defeating Thoughts" in the Adult Psychotherapy Homework Planner by [facility] or read What to Say When You Talk to Yourself by Helm[employer]etter). Ask the client to complete and process  self-esteem-building exercises from recommended self-help books (e.g., Ten Days to Self Esteem! by Lawerance Bach; The Self-Esteem Companion by Murvin Donning, [place], and [employer]; 10 Simple Solutions for [employer] by [employer]). Objective Identify and engage in activities that would improve self-image by being consistent with one's values. Target Date: 2023-11-05 Frequency: Biweekly  Progress: 50 Modality: individual  Related Interventions Help the client analyze his/her values and the congruence or incongruence between them and the client's daily activities. Identify and assign activities congruent with the client's values; process them toward improving self-concept and self-esteem. Objective Increase the frequency of assertive behaviors. Target Date: 2023-11-05 Frequency: Biweekly  Progress: 70 Modality: individual  Related Interventions Train  the client in assertiveness or refer him/her to a group that will educate and facilitate assertiveness skills via lectures and assignments. Objective Demonstrate an increased ability to identify and express personal feelings. Target Date: 2023-11-05 Frequency: Biweekly  Progress: 60 Modality: individual  Related Interventions Assign the client to keep a journal of feelings on a daily basis. Objective Articulate a plan to be proactive in trying to get identified needs met. Target Date: 2023-11-05 Frequency: Biweekly  Progress: 60 Modality: individual  Related Interventions Assist the client in identifying and verbalizing his/her needs, met and unmet. Assist the client in developing a specific action plan to get each need met (or assign "Satisfying Unmet Emotional Needs" in the Adult Psychotherapy Homework Planner by [facility]). Objective Decrease the frequency of negative self-descriptive statements and increase frequency of positive self-descriptive statements. Target Date: 2023-11-05 Frequency: Biweekly  Progress: 30 Modality: individual   Related Interventions Assist the client in becoming aware of how he/she expresses or acts out negative feelings about himself/herself. Help the client reframe his/her negative assessment of himself/herself. 5. Establish an inward sense of self-worth, confidence, and competence. 6. Increase awareness of own role in the relationship conflicts. Objective Identify problems and strengths in the relationship, including one's own role in each. Target Date: 2023-11-05 Frequency: Biweekly  Progress: 50 Modality: individual  Related Interventions Assess current, ongoing problems in the relationship, including possible abuse/neglect, substance use, communication, conflict resolution, as well as home environment (if domestic violence is present, plan for safety and avoid early use of conjoint sessions; see the Physical Abuse chapter in The Couples Psychotherapy Treatment Planner by Genoveva Ill, and Jongsma). Assess strengths in the relationship that could be enhanced during the therapy to facilitate the accomplishment of therapeutic goals. Objective Make a commitment to change specific behaviors that have been identified by self or the partner. Target Date: 2023-11-05 Frequency: Biweekly  Progress: 30 Modality: individual  Related Interventions Process the list of positive and problematic features of each partner and the relationship; ask couple to agree to work on changes he/she needs to make to improve the relationship, generating a list of targeted changes (or assign "How Can We Meet Each Other's Needs and Desires?" in the Adult Psychotherapy Homework Planner by Stephannie Li). Objective Increase the frequency of the direct expression of honest, respectful, and positive feelings and thoughts within the relationship. Target Date: 2023-11-05 Frequency: Biweekly  Progress: 50 Modality: individual  Related Interventions Assist the couple in identifying conflicts that can be addressed using communication,  conflict-resolution, and/or problem-solving skills (see "Behavioral Marital Therapy" by Loyce Dys). Use behavioral techniques (education, modeling, role-playing, corrective feedback, and positive reinforcement) to teach communication skills including assertive communication, offering positive feedback, active listening, making positive requests of others for behavior change, and giving negative feedback in an honest and respectful manner. Objective Learn and implement problem-solving and conflict resolution skills. Target Date: 2023-11-05 Frequency: Biweekly  Progress: 60 in general; 50 in relationship Modality: individual  Related Interventions Review how newly learned communication skills can be applied to conflict resolution through calm, respectful, effective dialogue; role-play application of this skill to a present conflict situation. Use behavioral techniques (education, modeling, role-playing, corrective feedback, and positive reinforcement) to teach the couple problem-solving and conflict resolution skills including defining the problem constructively and specifically, brainstorming options, evaluating options, compromise, choosing options and implementing a plan, evaluating the results. Objective Understand the origin of each other's negative emotions and reactions and develop more constructive interactions that fill needs. Target Date: 2023-11-05 Frequency: Biweekly  Progress: 40 Modality: individual  Related Interventions Encourage the clients to recognize, reframe, and express these insecurities toward resolving negative emotional and behavioral reactions. Assist the clients in developing more constructive interactions that satisfy attachment needs such as increased intimacy and expressions of love (or assign "How Can We Meet Each Other's Needs and Desires?" in the Adult Psychotherapy Homework Planner by Stephannie Li). Objective Increase time spent in enjoyable contact  with the partner. Target Date: 2023-11-05 Frequency: Biweekly  Progress: 60 Modality: individual  Related Interventions Assist the couple in identifying and planning rewarding social/recreational activities that can be shared with the partner (or assign "Identify and Schedule Pleasant Activities" in the Adult Psychotherapy Homework Planner by Stephannie Li). Objective Identify any patterns of destructive and/or abusive behavior in the relationship. Target Date: 2023-11-05 Frequency: Biweekly  Progress: 50 Modality: individual  Related Interventions Assess current patterns of destructive and/or abusive behavior for each partner, including those that existed in each family of origin (if domestic violence is present, plan for safety and avoid early use of conjoint sessions; see the Physical Abuse chapter in The Couples Psychotherapy Treatment Planner by Genoveva Ill, and Jongsma). 7. Increase job satisfaction and performance due to implementation of assertiveness and stress management strategies. 8. Increase sense of confidence and competence in dealing with work responsibilities. Objective Implement assertiveness skills. Target Date: 2023-11-05 Frequency: Biweekly  Progress: 60 Modality: individual  Related Interventions Train the client in assertiveness skills or refer to assertiveness training class that teaches effective communication of needs and feelings without aggression or defensiveness. Objective Learn and implement problem-solving skills. Target Date: 2023-11-05 Frequency: Biweekly  Progress: 70 Modality: individual  Related Interventions Conduct Problem-Solving Therapy (see Problem-Solving Therapy by Domenick Bookbinder and Rob Hickman) using techniques such as psychoeducation, modeling, and role-playing to teach the client problem-solving skills (i.e., defining a problem specifically, generating possible solutions, evaluating the pros and cons of each solution, selecting and implementing a plan of  action, evaluating the efficacy of the plan, accepting or revising the plan); role-play application of the problem-solving skill to a real life issue (or assign "Applying Problem-Solving to Interpersonal Conflict" in the Adult Psychotherapy Homework Planner by Stephannie Li). Objective Verbalize healthy, realistic cognitive messages that promote harmony with others, self-acceptance, and self-confidence. Target Date: 2023-11-05 Frequency: Biweekly  Progress: 40 Modality: individual  Related Interventions Teach the client the connection between thoughts, feelings, and behavior; train the client in the development of more realistic, healthy cognitive messages that relieve anxiety and depression. Objective Learn and implement calming skills to reduce overall anxiety and manage anxiety symptoms. Target Date: 2023-11-05 Frequency: Biweekly  Progress: 60 Modality: individual  Related Interventions Teach the client calming/relaxation skills (e.g., applied relaxation, progressive muscle relaxation, cue controlled relaxation, mindful breathing, biofeedback) and how to discriminate better between relaxation and tension; teach the client how to apply these skills to his/her daily life (e.g., New Directions in Progressive Muscle Relaxation by Marcelyn Ditty, and Hazlett-Stevens; The Relaxation and Stress Reduction Workbook by Kevan Rosebush, and [employer]). Objective Identify any personal problems that may be causing conflict in the employment setting. Target Date: 2023-11-05 Frequency: Biweekly  Progress: 50 Modality: individual  Related Interventions Explore the client's transfer of personal problems to the employment situation. Objective Identify the effect that vocational stress has on feelings toward self and relationships with significant others. Target Date: 2023-11-05 Frequency: Biweekly  Progress: 70 Modality: individual  Related Interventions Explore the effect of the client's vocational  stress on his/her intra- and interpersonal dynamics with friends and family. Objective Develop and verbalize  a plan for constructive action to reduce vocational stress. Target Date: 2023-11-05 Frequency: Biweekly  Progress: 50 Modality: individual  Related Interventions Assist the client in developing a plan to react positively to his/her vocational situation (or assign "My Vocational Action Plan" in the Adult Psychotherapy Homework Planner by [facility]); process the proactive plan and assist in its implementation.  Diagnosis:Moderate episode of recurrent major depressive disorder (HCC)  Attention deficit hyperactivity disorder (ADHD), combined type  Plan:  -read article on how to say no nicely -consider template with drop down boxes to expedite note writing -ask for help with student -next session will be on Wednesday, May 18, 2023 at 1pm.

## 2023-05-05 LAB — LIPID PANEL W/O CHOL/HDL RATIO
Cholesterol, Total: 159 mg/dL (ref 100–199)
HDL: 71 mg/dL (ref >39)
LDL Chol Calc (NIH): 74 mg/dL (ref 0–99)
Triglycerides: 73 mg/dL (ref 0–149)
VLDL Cholesterol Cal: 14 mg/dL (ref 5–40)

## 2023-05-05 LAB — SPECIMEN STATUS REPORT

## 2023-05-05 NOTE — Progress Notes (Signed)
Cholesterol looks great

## 2023-05-06 DIAGNOSIS — M7542 Impingement syndrome of left shoulder: Secondary | ICD-10-CM

## 2023-05-06 DIAGNOSIS — G8929 Other chronic pain: Secondary | ICD-10-CM | POA: Diagnosis not present

## 2023-05-06 DIAGNOSIS — M25512 Pain in left shoulder: Secondary | ICD-10-CM | POA: Diagnosis not present

## 2023-05-06 MED ORDER — TRIAMCINOLONE ACETONIDE 40 MG/ML IJ SUSP
80.0000 mg | Freq: Once | INTRAMUSCULAR | Status: AC
  Administered 2023-05-06: 80 mg via INTRAMUSCULAR

## 2023-05-06 NOTE — Addendum Note (Signed)
 Addended by: Ernest Mallick A on: 05/06/2023 08:22 AM   Modules accepted: Orders

## 2023-05-09 ENCOUNTER — Encounter: Payer: Self-pay | Admitting: Family Medicine

## 2023-05-09 ENCOUNTER — Other Ambulatory Visit: Payer: Self-pay

## 2023-05-09 ENCOUNTER — Other Ambulatory Visit (HOSPITAL_COMMUNITY): Payer: Self-pay

## 2023-05-09 ENCOUNTER — Ambulatory Visit (INDEPENDENT_AMBULATORY_CARE_PROVIDER_SITE_OTHER): Payer: 59 | Admitting: Family Medicine

## 2023-05-09 VITALS — BP 116/78 | HR 69 | Temp 98.6°F | Resp 16 | Ht 63.0 in | Wt 133.4 lb

## 2023-05-09 DIAGNOSIS — G8929 Other chronic pain: Secondary | ICD-10-CM | POA: Diagnosis not present

## 2023-05-09 DIAGNOSIS — M542 Cervicalgia: Secondary | ICD-10-CM | POA: Diagnosis not present

## 2023-05-09 DIAGNOSIS — B999 Unspecified infectious disease: Secondary | ICD-10-CM | POA: Diagnosis not present

## 2023-05-09 DIAGNOSIS — R21 Rash and other nonspecific skin eruption: Secondary | ICD-10-CM

## 2023-05-09 DIAGNOSIS — F330 Major depressive disorder, recurrent, mild: Secondary | ICD-10-CM

## 2023-05-09 DIAGNOSIS — F5101 Primary insomnia: Secondary | ICD-10-CM | POA: Diagnosis not present

## 2023-05-09 DIAGNOSIS — F902 Attention-deficit hyperactivity disorder, combined type: Secondary | ICD-10-CM | POA: Diagnosis not present

## 2023-05-09 MED ORDER — VENLAFAXINE HCL ER 150 MG PO CP24
150.0000 mg | ORAL_CAPSULE | Freq: Every day | ORAL | 1 refills | Status: DC
  Filled 2023-05-09: qty 90, 90d supply, fill #0
  Filled 2023-08-03: qty 90, 90d supply, fill #1

## 2023-05-09 MED ORDER — LISDEXAMFETAMINE DIMESYLATE 40 MG PO CAPS
40.0000 mg | ORAL_CAPSULE | Freq: Every morning | ORAL | 0 refills | Status: DC
  Filled 2023-05-09: qty 30, 30d supply, fill #0

## 2023-05-09 NOTE — Assessment & Plan Note (Signed)
 Still going to counseling.  Has been journaling and that has been helping  discussed trial of inc on Effexor. If not improving by 6 mo then consider changing regimen.  F/U in 2-3 months.

## 2023-05-09 NOTE — Assessment & Plan Note (Signed)
 Husband now wearing CPAP for about 4 hours so that has been helpful. Still using trazodone.

## 2023-05-09 NOTE — Assessment & Plan Note (Signed)
 Seeing asthma and allergy. Plan for Prevnar 20 injection.

## 2023-05-09 NOTE — Assessment & Plan Note (Signed)
 PRN valium 1/2-1 tab has been helpful.

## 2023-05-09 NOTE — Assessment & Plan Note (Signed)
 Creased Vyvanse from 30 mg up to 40 mg.

## 2023-05-09 NOTE — Progress Notes (Signed)
   Established Patient Office Visit  Subjective  Patient ID: April Webster, female    DOB: 07-08-1975  Age: 48 y.o. MRN: [id number]  Chief Complaint  Patient presents with   Medical Management of Chronic Issues    HPI  F/U ADHD - feels like did OK on vyvanse in re to no side effects. Feels like maybe we need to go up to be more effective with inattention.    F/U night sweats. - the effexor has reduced night sweats from 7 per night to 2-3 per week.  But still struggling with mood.  Doesn't enjoy things she normally enjoys.      ROS    Objective:     BP 116/78   Pulse 69   Temp 98.6 F (37 C) (Oral)   Resp 16   Ht 5\' 3"  (1.6 m)   Wt 133 lb 6.4 oz (60.5 kg)   SpO2 99%   BMI 23.63 kg/m    Physical Exam Vitals and nursing note reviewed.  Constitutional:      Appearance: Normal appearance.  HENT:     Head: Normocephalic and atraumatic.  Eyes:     Conjunctiva/sclera: Conjunctivae normal.  Cardiovascular:     Rate and Rhythm: Normal rate and regular rhythm.  Pulmonary:     Effort: Pulmonary effort is normal.     Breath sounds: Normal breath sounds.  Skin:    General: Skin is warm and dry.  Neurological:     Mental Status: She is alert.  Psychiatric:        Mood and Affect: Mood normal.      No results found for any visits on 05/09/23.    The 10-year ASCVD risk score (Arnett DK, et al., 2019) is: 0.5%    Assessment & Plan:   Problem List Items Addressed This Visit       Other   Recurrent infections   Seeing asthma and allergy. Plan for Prevnar 20 injection.       Primary insomnia   Husband now wearing CPAP for about 4 hours so that has been helpful. Still using trazodone.       MDD (major depressive disorder), recurrent episode (HCC)   Still going to counseling.  Has been journaling and that has been helping  discussed trial of inc on Effexor. If not improving by 6 mo then consider changing regimen.  F/U in 2-3 months.       Relevant  Medications   venlafaxine XR (EFFEXOR XR) 150 MG 24 hr capsule   Chronic neck pain   PRN valium 1/2-1 tab has been helpful.        Relevant Medications   venlafaxine XR (EFFEXOR XR) 150 MG 24 hr capsule   Attention deficit hyperactivity disorder (ADHD), combined type   Creased Vyvanse from 30 mg up to 40 mg.      Relevant Medications   lisdexamfetamine (VYVANSE) 40 MG capsule   Other Visit Diagnoses       Rash    -  Primary   Relevant Orders   Ambulatory referral to Dermatology       No follow-ups on file.    Nani Gasser, MD

## 2023-05-10 DIAGNOSIS — M7522 Bicipital tendinitis, left shoulder: Secondary | ICD-10-CM | POA: Diagnosis not present

## 2023-05-10 NOTE — Therapy (Incomplete)
 OUTPATIENT PHYSICAL THERAPY TREATMENT    Patient Name: April Webster MRN: [id number] DOB:01/30/1976, 48 y.o., female Today's Date: 05/10/2023  END OF SESSION:         Past Medical History:  Diagnosis Date   Chronic idiopathic constipation    DDD (degenerative disc disease), cervical and lumbar    Female pattern hair loss    Graves disease    Hyperlipidemia    Hypertension    Hypothyroidism    Lichen sclerosus    Raynaud's disease    Past Surgical History:  Procedure Laterality Date   ABDOMINOPLASTY     AUGMENTATION MAMMAPLASTY Bilateral 05/2019   Silicone    CHOLECYSTECTOMY     DILITATION & CURRETTAGE/HYSTROSCOPY WITH NOVASURE ABLATION N/A 02/09/2022   Procedure: DILATATION & CURETTAGE/HYSTEROSCOPY WITH NOVASURE ABLATION, REMOVAL OF IUD;  Surgeon: Milas Hock, MD;  Location: [facility] OR;  Service: Gynecology;  Laterality: N/A;   PLACEMENT OF BREAST IMPLANTS     THYROID SURGERY     TUBAL LIGATION     Patient Active Problem List   Diagnosis Date Noted   Chronic neck pain 04/13/2023   Recurrent infections 03/02/2023   Chronic left shoulder pain 12/21/2022   Attention deficit hyperactivity disorder (ADHD), combined type 07/20/2022   Primary insomnia 09/09/2021   Chronic pain of right knee 08/12/2021   LGSIL on Pap smear of cervix 07/01/2020   Raynaud's disease 02/18/2020   IC (interstitial cystitis) 02/18/2020   Chronic constipation 02/18/2020   DDD (degenerative disc disease), cervical 12/14/2019   Gastroesophageal reflux disease 05/16/2019   Hypothyroidism 03/19/2019   Hyperlipidemia 03/19/2019   Facet syndrome, lumbar 03/19/2019   Female pattern hair loss 03/19/2019   Lichen sclerosus 03/19/2019   Upper airway cough syndrome 03/19/2019   IUD (intrauterine device) in place 12/06/2016   MDD (major depressive disorder), recurrent episode (HCC) 05/10/2011    PCP: Agapito Games, MD  REFERRING PROVIDER: Monica Becton, MD  REFERRING DIAG: M75.42  (ICD-10-CM) - Impingement syndrome, shoulder, left  THERAPY DIAG:  No diagnosis found.  Rationale for Evaluation and Treatment: Rehabilitation  ONSET DATE: 3-4 months   SUBJECTIVE:                                                                                                                                                                                     Per eval - Pt endorses history of multiple illnesses over past couple of years which has affected her exercise routine. States she was beginning to exercise more, was trying to progress her overhead presses and felt an audible pull in her L shoulder. States that it was not painful but the next day felt sharp/stabbing  pain in lateral/anterior shoulder. Trial of NSAIDs and home exercises without significant change. Pt states that she ended up getting steroid injection which gave her a few weeks of relief, but in early December was reaching forward, felt a sharp pain and has been exacerbated since. She notes difficulty with general movement of LUE but also endorses intermittent sharp pains while sitting unsupported. Difficulty sleeping as well, waking 1-2x/night on average. Does endorse some numbness in digits 4 and 5 - states this was occurring prior to shoulder pain but does seem more prominent now.  Endorses intermittent headaches. Denies any visual changes, swallowing/speech issues. No fevers. Endorses some night sweats she attributes to menopause. No BIL numbness aside from raynaud's history.  Hand dominance: Right  SUBJECTIVE STATEMENT: 05/10/2023 ***  ***  fatigued today, shoulder is doing okay. Hasn't been as bad but still has some significant painwith reaching. Some anterior and lateral shoulder pain. Tried some light resistance work in [employer], some increase in pain for a couple hours. Hasn't had any distal symptoms in a while.     PERTINENT HISTORY: See above PMH  PAIN:  Are you having pain: 2-3/10 ***   Per eval -   Location/description: L lateral/anterior shoulder. Posterior at times. Sometimes referring distally into digits 4 and 5 Best-worst over past week: 0-9/10  - aggravating factors: movements, difficulty reaching behind back, seat belts, holding items - Easing factors: hot bath    PRECAUTIONS: None   WEIGHT BEARING RESTRICTIONS: No  FALLS:  Has patient fallen in last 6 months? Reports frequent near falls, reports 1 major fall back in April   LIVING ENVIRONMENT: 2 story house 1 STE, bed/bath upstairs, bathroom downstairs. 14 stairs to second floor Lives w/ husband, 2 kids, 4 dogs, 2 snakes  OCCUPATION: Nurse Practitioner  PLOF: Independent  PATIENT GOALS: be able to move arm better, get back to strength training   NEXT MD VISIT: TBD   OBJECTIVE:  Note: Objective measures were completed at Evaluation unless otherwise noted.  DIAGNOSTIC FINDINGS:  02/28/23 L shoulder MRI "IMPRESSION: 1. Mild supraspinatus and infraspinatus tendinosis without a tear. 2.  No acute osseous injury of the left shoulder."  PATIENT SURVEYS:  FOTO 54 > 67  FOTO 04/20/23: 52   COGNITION: Overall cognitive status: Within functional limits for tasks assessed     SENSATION: Light touch intact B UE   POSTURE: Increased L UT elevation, rounded shoulders, tendency towards mildly guarded posture L UE  UPPER EXTREMITY ROM:  A/PROM Right eval Left eval Left 04/13/23  Shoulder flexion 162 150 deg  164 deg   Shoulder abduction 170 105 deg * 108 deg *  Shoulder internal rotation (functional combo) T5 L3 *   Shoulder external rotation (functional combo) T2 C5 *   Elbow flexion     Elbow extension     Wrist flexion     Wrist extension      (Blank rows = not tested) (Key: WFL = within functional limits not formally assessed, * = concordant pain, s = stiffness/stretching sensation, NT = not tested)  Comments:    UPPER EXTREMITY MMT:  MMT Right eval Left eval  Shoulder flexion 5 4 *   Shoulder  extension    Shoulder abduction 5 4+  Shoulder extension    Shoulder internal rotation 5 4 *  Shoulder external rotation 5 4  Elbow flexion 5 4+   Elbow extension 5 4+  Grip strength    (Blank rows = not tested)  (Key:  WFL = within functional limits not formally assessed, * = concordant pain, s = stiffness/stretching sensation, NT = not tested)  Comments:   SHOULDER SPECIAL TESTS: Positive neers on L   JOINT MOBILITY TESTING:  deferred  PALPATION:  Significant concordant TTP L bicipital groove, infraspinatus/teres. Mild tenderness L deltoid, rhomboid, UT/LS                                                                                                                             TREATMENT DATE:  [facility] Adult PT Treatment:                                                DATE: 05/11/23 Therapeutic Exercise: *** Manual Therapy: *** Neuromuscular re-ed: *** Therapeutic Activity: *** Modalities: *** Self Care: ***   [facility] Adult PT Treatment:                                                DATE: 04/27/23 Therapeutic Exercise: Standing red band chest press 2x8 Supine pec stretch+ unresisted fly 2x8; initially short arc (0-45, progressing to 0-90) Green band double ER x12 GH abd pulse at ~45 deg and ~90, x8 green band cues for comfortable positioning/tension  Manual Therapy: Supine - STM L anterior deltoid, distal biceps, lateral pec major and pec minor ; trigger point release throughout aforementioned musculature ; passive pin and stretch w/ emphasis on elbow extension (biceps) and GH ER at varying degrees of abduction (pec major/minor) Supine - multiplanar passive physiological movement L GHJ all planes with gentle oscillations     [facility] Adult PT Treatment:                                                DATE: 04/20/23 Therapeutic Exercise: Reclined bicep curl 5# w/ towel prop 2x8 Doorway pec stretch 2x30sec cues for positioning and comfort, trials for different levels of  elevation for pt comfort Tricep pull down green band 2x8 Green band high>low row x12 HEP update + education  Manual Therapy: Supine; passive pin and stretch bicep, lateral pec, subscap; STM anterior deltoid and bicep    PATIENT EDUCATION: Education details: rationale for interventions, HEP  Person educated: Patient Education method: Explanation, Demonstration, Tactile cues, Verbal cues Education comprehension: verbalized understanding, returned demonstration, verbal cues required, tactile cues required, and needs further education     HOME EXERCISE PROGRAM: Access Code: [email or web address] URL: [email or web address] Date: 04/20/2023 Prepared by: Fransisco Hertz  Program Notes - can do swiss ball flexion standing, as done in clinic  Exercises - Seated Flexion Stretch  with [employer]  - 2-3 x daily - 1 sets - 8-10 reps - Standing Single Arm Elbow Flexion with Resistance  - 2-3 x daily - 1 sets - 8 reps - Doorway Rhomboid Stretch  - 2-3 x daily - 1 sets - 1-3 reps - 30sec hold - Standing High Row with Anchored Resistance  - 2-3 x daily - 1 sets - 8 reps - Corner Pec Minor Stretch  - 2-3 x daily - 1 sets - 1-3 reps - 30sec hold  ASSESSMENT:  CLINICAL IMPRESSION: 05/10/2023 ***  *** Pt arrives w/ 2-3/10 pain on NPS, mostly anterior/superior; notes distal symptoms improving notably, also having less posterior pain. On palpation she does have a trigger point in lateral pec major/minor that refers pain into posterior shoulder - modest improvement with trigger point release and passive pin and stretch, pt reports good transient relief during manual as above. Following this with exercises working on anterior deltoid and pecs, some initial discomfort but improves with repetition. Mild symptom irritability on departure in keeping with prior sessions, no adverse events. HEP update as above. Recommend continuing along current POC in order to address relevant deficits and improve functional  tolerance. Pt departs today's session in no acute distress, all voiced questions/concerns addressed appropriately from PT perspective.     Per eval - Patient is a 48 y.o. woman who was seen today for physical therapy evaluation and treatment for L shoulder pain ongoing 3-4 months. Reports difficulty with lifting, reaching, and sitting unsupported. On exam she demonstrates concordant limitations in L GH mobility, shoulder/elbow MMT, postural deficits, and TTP of GH musculature. HEP today w/ introduction of isometrics with aim of improving stability and comfort with muscular contractions. No adverse events, endorses some soreness on departure. Recommend trial of skilled PT to address aforementioned deficits with aim of improving functional tolerance and reducing pain with typical activities. Pt departs today's session in no acute distress, all voiced concerns/questions addressed appropriately from PT perspective.      OBJECTIVE IMPAIRMENTS: decreased activity tolerance, decreased endurance, decreased mobility, decreased ROM, decreased strength, impaired UE functional use, improper body mechanics, postural dysfunction, and pain.   ACTIVITY LIMITATIONS: carrying, lifting, sitting, and reach over head  PARTICIPATION LIMITATIONS: meal prep, cleaning, laundry, community activity, and occupation  PERSONAL FACTORS: 3+ comorbidities: HTN, raynauds, depression, insomnia  are also affecting patient's functional outcome.   REHAB POTENTIAL: Good  CLINICAL DECISION MAKING: Stable/uncomplicated  EVALUATION COMPLEXITY: Low   GOALS:  SHORT TERM GOALS: Target date: 04/20/2023 Pt will demonstrate appropriate understanding and performance of initially prescribed HEP in order to facilitate improved independence with management of symptoms.  Baseline: HEP provided on eval 04/20/23: good HEP adherence reported Goal status: MET  2. Pt will score greater than or equal to 61 on FOTO in order to demonstrate improved  perception of function due to symptoms.  Baseline: 54 04/20/23: 52 Goal status: ONGOING  LONG TERM GOALS: Target date: 05/18/2023 Pt will score 67 on FOTO in order to demonstrate improved perception of function due to symptoms. Baseline: 54 Goal status: INITIAL  2.  Pt will demonstrate at least 150 degrees of active L shoulder elevation in order to demonstrate improved tolerance to functional movement patterns such as reaching overhead.  Baseline: see ROM chart above Goal status: INITIAL  3.  Pt will demonstrate at least 4+/5 shoulder flex/abduction MMT on LUE for improved symmetry of UE strength and improved tolerance to functional movements.  Baseline: see MMT chart above Goal status: INITIAL  4. Pt will endorse waking up no more than 3-4x/week on average due to shoulder pain in order to improve overall health and QOL.  Baseline: 1-2x/night on average  Goal status: INITIAL  5. Pt will report at least 50% decrease in overall pain levels in past week in order to facilitate improved tolerance to basic ADLs/mobility.   Baseline: 0-9/10  Goal status: INITIAL    6. Pt will demonstrate functional combo ER/IR within at least 1 vertebra of contralateral limb in order to facilitate improved functional UE mobility.   Baseline: see ROM chart above  Goal status: INITIAL   PLAN:  PT FREQUENCY: 1x/week  PT DURATION: 8 weeks  PLANNED INTERVENTIONS: 97164- PT Re-evaluation, 97110-Therapeutic exercises, 97530- Therapeutic activity, 97112- Neuromuscular re-education, 97535- Self Care, [id number]- Manual therapy, 97014- Electrical stimulation (unattended), Patient/Family education, Stair training, Taping, Joint mobilization, Spinal mobilization, Cryotherapy, and Moist heat  PLAN FOR NEXT SESSION: Review/update HEP PRN. Focus on anterior shoulder pain next couple visits - comfortable pressing movements, pec/deltoid. Begin to work on more functional/compound movements as able/appropriate. Symptom  modification strategies as indicated/appropriate.    Ashley Murrain PT, DPT 05/10/2023 8:37 AM

## 2023-05-11 ENCOUNTER — Ambulatory Visit: Payer: 59 | Admitting: Physical Therapy

## 2023-05-11 ENCOUNTER — Encounter: Payer: Self-pay | Admitting: Physical Therapy

## 2023-05-11 DIAGNOSIS — M25512 Pain in left shoulder: Secondary | ICD-10-CM | POA: Diagnosis not present

## 2023-05-11 DIAGNOSIS — R293 Abnormal posture: Secondary | ICD-10-CM | POA: Diagnosis not present

## 2023-05-11 DIAGNOSIS — M6281 Muscle weakness (generalized): Secondary | ICD-10-CM

## 2023-05-12 ENCOUNTER — Other Ambulatory Visit (HOSPITAL_BASED_OUTPATIENT_CLINIC_OR_DEPARTMENT_OTHER): Payer: Self-pay

## 2023-05-12 ENCOUNTER — Other Ambulatory Visit: Payer: Self-pay | Admitting: Sports Medicine

## 2023-05-12 MED ORDER — OSELTAMIVIR PHOSPHATE 75 MG PO CAPS
75.0000 mg | ORAL_CAPSULE | Freq: Two times a day (BID) | ORAL | 5 refills | Status: DC
  Filled 2023-05-12: qty 10, 5d supply, fill #0

## 2023-05-13 ENCOUNTER — Other Ambulatory Visit: Payer: Self-pay | Admitting: Family Medicine

## 2023-05-13 DIAGNOSIS — E039 Hypothyroidism, unspecified: Secondary | ICD-10-CM

## 2023-05-15 ENCOUNTER — Other Ambulatory Visit: Payer: Self-pay | Admitting: Family Medicine

## 2023-05-16 ENCOUNTER — Other Ambulatory Visit (HOSPITAL_BASED_OUTPATIENT_CLINIC_OR_DEPARTMENT_OTHER): Payer: Self-pay

## 2023-05-16 ENCOUNTER — Other Ambulatory Visit (HOSPITAL_COMMUNITY): Payer: Self-pay

## 2023-05-16 MED ORDER — FINASTERIDE 5 MG PO TABS
2.5000 mg | ORAL_TABLET | Freq: Every day | ORAL | 3 refills | Status: AC
  Filled 2023-05-16: qty 45, 90d supply, fill #0
  Filled 2023-08-10: qty 45, 90d supply, fill #1
  Filled 2023-10-17 – 2023-11-09 (×2): qty 45, 90d supply, fill #2
  Filled 2024-02-07: qty 45, 90d supply, fill #3

## 2023-05-18 ENCOUNTER — Encounter: Payer: Self-pay | Admitting: Professional

## 2023-05-18 ENCOUNTER — Encounter: Payer: 59 | Admitting: Physical Therapy

## 2023-05-18 ENCOUNTER — Ambulatory Visit: Payer: 59 | Admitting: Professional

## 2023-05-18 DIAGNOSIS — F902 Attention-deficit hyperactivity disorder, combined type: Secondary | ICD-10-CM

## 2023-05-18 DIAGNOSIS — F331 Major depressive disorder, recurrent, moderate: Secondary | ICD-10-CM | POA: Diagnosis not present

## 2023-05-18 NOTE — Progress Notes (Signed)
 [facility] Counselor/Therapist Progress Note  Patient ID: April Webster, MRN: [id number],    Date: 05/18/2023  Time Spent: 40 minutes [id number]  Treatment Type: Individual Therapy  Risk Assessment: Danger to Self:  No Self-injurious Behavior: No Danger to Others: No  Subjective:  This session was held via video teletherapy. The patient consented to video teletherapy and was located in her car during this session. She is aware it is the responsibility of the patient to secure confidentiality on her end of the session. The provider was in a private home office for the duration of this session.    The patient arrived late for her Caregility session.  Issues addressed: 1-homework -read article on how to say no nicely -consider template with drop down boxes to expedite note writing -ask for help with student- she has passed off some tasks to CNA, Dr. 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 Everardo Pacific, surgeon who told her to call when she is ready to be scheduled for surgery 4-professional -she is asking for help or at least trying to -she has set boundaries with her student -she reports her student is doing much better -pt is no longer fighting when she gets sent home for being sick -she still works when she gets home -they will be down a provider -Dr. Linford Arnold  has been asked to deal with merger needs   -she will be there only two days per week -discuss modified week with Toniann Fail  Treatment Plan Problems Addressed  Intimate Relationship Conflicts, Low Self-Esteem, Unipolar Depression, Vocational Stress  Goals 1. Alleviate depressive symptoms and return to previous level of effective functioning. Objective Identify and replace thoughts and beliefs that support depression. Target Date: 2023-11-05 Frequency: Biweekly  Progress: 40 Modality: individual  Related Interventions Conduct Cognitive-Behavioral Therapy (see Cognitive Behavior Therapy by Reola Calkins; Overcoming Depression by Agapito Games al.), beginning with helping the client learn the connection among cognition, depressive feelings, and actions. Assign the client to self-monitor thoughts, feelings, and actions in daily journal (e.g., "Negative Thoughts Trigger Negative Feelings" in the Adult Psychotherapy Homework Planner by Stephannie Li; "Daily Record of Dysfunctional Thoughts" in Cognitive Therapy of Depression by Ashby Dawes and Shea Evans); process the journal material to challenge depressive thinking patterns and replace them with reality-based thoughts. Assign "behavioral experiments" in which depressive automatic thoughts are treated as hypotheses/prediction, reality-based alternative hypotheses/prediction are generated, and both are tested against the client's past, present, and/or future experiences. Facilitate and reinforce the client's shift from biased depressive self-talk and beliefs to reality-based cognitive messages that enhance self-confidence and increase adaptive actions (see "Positive Self-Talk" in the Adult Psychotherapy Homework Planner by Stephannie Li). Objective Learn and implement behavioral strategies to overcome depression. Target Date: 2023-11-05 Frequency: Biweekly  Progress: 60 Modality: individual  Related Interventions Engage the client in "behavioral activation," increasing his/her  activity level and contact with sources of reward, while identifying processes that inhibit activation (see Behavioral Activation for Depression by Katharine Look, Dimidjian, and Herman-Dunn; or assign "Identify and Schedule Pleasant Activities" in the Adult Psychotherapy Homework Planner by [facility]); use behavioral techniques such as instruction, rehearsal, role-playing, role reversal, as needed, to facilitate activity in the client's daily life; reinforce success. Assist the client in developing skills that increase the likelihood of deriving pleasure from behavioral activation (e.g., assertiveness skills, developing an exercise plan, less internal/more external focus, increased social involvement); reinforce success. Objective Learn and implement problem-solving and decision-making skills. Target Date: 2023-11-05 Frequency: Biweekly  Progress: 60 Modality: individual  Related Interventions Conduct Problem-Solving Therapy (see Problem-Solving Therapy by Domenick Bookbinder and Rob Hickman) using techniques such as psychoeducation, modeling, and role-playing to teach client problem-solving skills (i.e., defining a problem specifically, generating possible solutions, evaluating the pros and cons of each solution, selecting and implementing a plan of action, evaluating the efficacy of the plan, accepting or revising the plan); role-play application of the problem-solving skill to a real life issue (or assign "Applying Problem-Solving to Interpersonal Conflict" in the Adult Psychotherapy Homework Planner by Stephannie Li). Objective Learn and implement conflict resolution skills to resolve interpersonal problems. Target Date: 2023-11-05 Frequency: Biweekly  Progress: 50 Modality: individual  Related Interventions Teach conflict resolution skills (e.g., empathy, active listening, "I messages," respectful communication, assertiveness without aggression, compromise); use psychoeducation, modeling, role-playing, and rehearsal to work  through several current conflicts; assign homework exercises; review and repeat so as to integrate their use into the client's life. Help the client resolve depression related to interpersonal problems through the use of reassurance and support, clarification of cognitive and affective triggers that ignite conflicts, and active problem-solving (or assign "Applying Problem-Solving to Interpersonal Conflict" in the Adult Psychotherapy Homework Planner by Stephannie Li). Objective Verbalize an understanding of healthy and unhealthy emotions with the intent of increasing the use of healthy emotions to guide actions. Target Date: 2023-11-05 Frequency: Biweekly  Progress: 50 Modality: individual  Related Interventions Use a process-experiential approach consistent with Emotion-Focused Therapy to create a safe, nurturing environment in which the client can process emotions, learning to identify and regulate unhealthy feelings and to generate more adaptive ones that then guide actions (see Emotion-Focused Therapy for Depression by Peter Minium). 2. Develop healthy thinking patterns and beliefs about self, others, and the world that lead to the alleviation and help prevent the relapse of depression. 3. Develop the necessary skills for effective, open communication, mutually satisfying sexual intimacy, and enjoyable time for companionship within the relationship. 4. Elevate self-esteem. Objective Increase insight into the historical and current sources of low self-esteem. Target Date: 2023-11-05 Frequency: Biweekly  Progress: 50 Modality: individual  Related Interventions Help the client become aware of his/her fear of rejection and its connection with past rejection or abandonment experiences; begin to contrast past experiences of pain with present experiences of acceptance and competence. Objective Identify and replace negative self-talk messages used to reinforce low self-esteem. Target Date:  2023-11-05 Frequency: Biweekly  Progress: 30 Modality: individual  Related Interventions Help the client identify his/her distorted, negative beliefs about self and the world and replace these messages with more realistic, affirmative messages (or assign "Journal and Replace Self-Defeating Thoughts" in the Adult Psychotherapy Homework Planner by [facility] or read What to Say When You Talk to Yourself by Helm[employer]). Ask the client to complete and process self-esteem-building exercises from recommended self-help books (e.g., Ten Days to Self Esteem! by Lawerance Bach; The Self-Esteem Companion by Murvin Donning,  [place], and [place]; 10 Simple Solutions for [employer] by [employer]). Objective Identify and engage in activities that would improve self-image by being consistent with one's values. Target Date: 2023-11-05 Frequency: Biweekly  Progress: 50 Modality: individual  Related Interventions Help the client analyze his/her values and the congruence or incongruence between them and the client's daily activities. Identify and assign activities congruent with the client's values; process them toward improving self-concept and self-esteem. Objective Increase the frequency of assertive behaviors. Target Date: 2023-11-05 Frequency: Biweekly  Progress: 70 Modality: individual  Related Interventions Train the client in assertiveness or refer him/her to a group that will educate and facilitate assertiveness skills via lectures and assignments. Objective Demonstrate an increased ability to identify and express personal feelings. Target Date: 2023-11-05 Frequency: Biweekly  Progress: 60 Modality: individual  Related Interventions Assign the client to keep a journal of feelings on a daily basis. Objective Articulate a plan to be proactive in trying to get identified needs met. Target Date: 2023-11-05 Frequency: Biweekly  Progress: 60 Modality: individual  Related Interventions Assist the client  in identifying and verbalizing his/her needs, met and unmet. Assist the client in developing a specific action plan to get each need met (or assign "Satisfying Unmet Emotional Needs" in the Adult Psychotherapy Homework Planner by [facility]). Objective Decrease the frequency of negative self-descriptive statements and increase frequency of positive self-descriptive statements. Target Date: 2023-11-05 Frequency: Biweekly  Progress: 30 Modality: individual  Related Interventions Assist the client in becoming aware of how he/she expresses or acts out negative feelings about himself/herself. Help the client reframe his/her negative assessment of himself/herself. 5. Establish an inward sense of self-worth, confidence, and competence. 6. Increase awareness of own role in the relationship conflicts. Objective Identify problems and strengths in the relationship, including one's own role in each. Target Date: 2023-11-05 Frequency: Biweekly  Progress: 50 Modality: individual  Related Interventions Assess current, ongoing problems in the relationship, including possible abuse/neglect, substance use, communication, conflict resolution, as well as home environment (if domestic violence is present, plan for safety and avoid early use of conjoint sessions; see the Physical Abuse chapter in The Couples Psychotherapy Treatment Planner by Genoveva Ill, and Jongsma). Assess strengths in the relationship that could be enhanced during the therapy to facilitate the accomplishment of therapeutic goals. Objective Make a commitment to change specific behaviors that have been identified by self or the partner. Target Date: 2023-11-05 Frequency: Biweekly  Progress: 30 Modality: individual  Related Interventions Process the list of positive and problematic features of each partner and the relationship; ask couple to agree to work on changes he/she needs to make to improve the relationship, generating a list of targeted  changes (or assign "How Can We Meet Each Other's Needs and Desires?" in the Adult Psychotherapy Homework Planner by Stephannie Li). Objective Increase the frequency of the direct expression of honest, respectful, and positive feelings and thoughts within the relationship. Target Date: 2023-11-05 Frequency: Biweekly  Progress: 50 Modality: individual  Related Interventions Assist the couple in identifying conflicts that can be addressed using communication, conflict-resolution, and/or problem-solving skills (see "Behavioral Marital Therapy" by Loyce Dys). Use behavioral techniques (education, modeling, role-playing, corrective feedback, and positive reinforcement) to teach communication skills including assertive communication, offering positive feedback, active listening, making positive requests of others for behavior change, and giving negative feedback in an honest and respectful manner. Objective Learn and implement problem-solving and conflict resolution skills. Target Date: 2023-11-05 Frequency: Biweekly  Progress: 60 in general; 50 in relationship Modality: individual  Related Interventions Review how newly learned communication skills can be applied to conflict resolution through calm, respectful, effective dialogue; role-play application of this skill to a present conflict situation. Use behavioral techniques (education, modeling, role-playing, corrective feedback, and positive reinforcement) to teach the couple problem-solving and conflict resolution skills including defining the problem constructively and specifically, brainstorming options, evaluating options, compromise, choosing options and implementing a plan, evaluating the results. Objective Understand the origin of each other's negative emotions and reactions and develop more constructive interactions that fill needs. Target Date: 2023-11-05 Frequency: Biweekly  Progress: 40 Modality: individual  Related  Interventions Encourage the clients to recognize, reframe, and express these insecurities toward resolving negative emotional and behavioral reactions. Assist the clients in developing more constructive interactions that satisfy attachment needs such as increased intimacy and expressions of love (or assign "How Can We Meet Each Other's Needs and Desires?" in the Adult Psychotherapy Homework Planner by Stephannie Li). Objective Increase time spent in enjoyable contact with the partner. Target Date: 2023-11-05 Frequency: Biweekly  Progress: 60 Modality: individual  Related Interventions Assist the couple in identifying and planning rewarding social/recreational activities that can be shared with the partner (or assign "Identify and Schedule Pleasant Activities" in the Adult Psychotherapy Homework Planner by Stephannie Li). Objective Identify any patterns of destructive and/or abusive behavior in the relationship. Target Date: 2023-11-05 Frequency: Biweekly  Progress: 50 Modality: individual  Related Interventions Assess current patterns of destructive and/or abusive behavior for each partner, including those that existed in each family of origin (if domestic violence is present, plan for safety and avoid early use of conjoint sessions; see the Physical Abuse chapter in The Couples Psychotherapy Treatment Planner by Genoveva Ill, and Jongsma). 7. Increase job satisfaction and performance due to implementation of assertiveness and stress management strategies. 8. Increase sense of confidence and competence in dealing with work responsibilities. Objective Implement assertiveness skills. Target Date: 2023-11-05 Frequency: Biweekly  Progress: 60 Modality: individual  Related Interventions Train the client in assertiveness skills or refer to assertiveness training class that teaches effective communication of needs and feelings without aggression or defensiveness. Objective Learn and implement problem-solving  skills. Target Date: 2023-11-05 Frequency: Biweekly  Progress: 70 Modality: individual  Related Interventions Conduct Problem-Solving Therapy (see Problem-Solving Therapy by Domenick Bookbinder and Rob Hickman) using techniques such as psychoeducation, modeling, and role-playing to teach the client problem-solving skills (i.e., defining a problem specifically, generating possible solutions, evaluating the pros and cons of each solution, selecting and implementing a plan of action, evaluating the efficacy of the plan, accepting or revising the plan); role-play application of the problem-solving skill to a real life issue (or assign "Applying Problem-Solving to Interpersonal Conflict" in the Adult Psychotherapy Homework Planner by Stephannie Li). Objective Verbalize healthy, realistic cognitive messages that promote harmony with others, self-acceptance, and self-confidence. Target Date: 2023-11-05 Frequency: Biweekly  Progress: 40 Modality: individual  Related Interventions Teach the client the connection between thoughts, feelings, and behavior; train the client in the development of more realistic, healthy cognitive messages that relieve anxiety and depression. Objective Learn and implement calming skills to reduce overall anxiety and manage anxiety symptoms. Target Date: 2023-11-05 Frequency: Biweekly  Progress: 60 Modality: individual  Related Interventions Teach the client calming/relaxation skills (e.g., applied relaxation, progressive muscle relaxation, cue controlled relaxation, mindful breathing, biofeedback) and how to discriminate better between relaxation and tension; teach the client how to apply these skills to his/her daily life (e.g., New Directions in Progressive Muscle Relaxation by Marcelyn Ditty, and Hazlett-Stevens; The Relaxation and Stress Reduction Workbook by  Kevan Rosebush, and [place]). Objective Identify any personal problems that may be causing conflict in the employment  setting. Target Date: 2023-11-05 Frequency: Biweekly  Progress: 50 Modality: individual  Related Interventions Explore the client's transfer of personal problems to the employment situation. Objective Identify the effect that vocational stress has on feelings toward self and relationships with significant others. Target Date: 2023-11-05 Frequency: Biweekly  Progress: 70 Modality: individual  Related Interventions Explore the effect of the client's vocational stress on his/her intra- and interpersonal dynamics with friends and family. Objective Develop and verbalize a plan for constructive action to reduce vocational stress. Target Date: 2023-11-05 Frequency: Biweekly  Progress: 50 Modality: individual  Related Interventions Assist the client in developing a plan to react positively to his/her vocational situation (or assign "My Vocational Action Plan" in the Adult Psychotherapy Homework Planner by [facility]); process the proactive plan and assist in its implementation.  Diagnosis:Moderate episode of recurrent major depressive disorder (HCC)  Attention deficit hyperactivity disorder (ADHD), combined type  Plan:   -next session will be on Wednesday, May 25, 2023 at 12pm.

## 2023-05-19 ENCOUNTER — Telehealth: Payer: Self-pay | Admitting: Family Medicine

## 2023-05-19 ENCOUNTER — Other Ambulatory Visit (HOSPITAL_BASED_OUTPATIENT_CLINIC_OR_DEPARTMENT_OTHER): Payer: Self-pay

## 2023-05-19 MED ORDER — PREDNISONE 20 MG PO TABS
40.0000 mg | ORAL_TABLET | Freq: Every day | ORAL | 0 refills | Status: DC
  Filled 2023-05-19: qty 10, 5d supply, fill #0

## 2023-05-19 NOTE — Telephone Encounter (Signed)
 Recovering from the flu.  She still is a lot of congestion medicine mostly clear with some intermittent nosebleeds.  But starting yesterday feels like her throat is More tight and swollen it is really uncomfortable to swallow and it is hard to swallow and that she is truly passing food.  Will send over prescription for prednisone.   Meds ordered this encounter  Medications   predniSONE (DELTASONE) 20 MG tablet    Sig: Take 2 tablets (40 mg total) by mouth daily with breakfast.    Dispense:  10 tablet    Refill:  0

## 2023-05-25 ENCOUNTER — Encounter: Payer: Self-pay | Admitting: Professional

## 2023-05-25 ENCOUNTER — Ambulatory Visit: Payer: 59 | Admitting: Professional

## 2023-05-25 DIAGNOSIS — F331 Major depressive disorder, recurrent, moderate: Secondary | ICD-10-CM

## 2023-05-25 DIAGNOSIS — F902 Attention-deficit hyperactivity disorder, combined type: Secondary | ICD-10-CM

## 2023-05-25 NOTE — Progress Notes (Signed)
 A user error has taken place: encounter opened in error, closed for administrative reasons.               April Webster, [facility]

## 2023-05-27 ENCOUNTER — Other Ambulatory Visit (HOSPITAL_COMMUNITY): Payer: Self-pay

## 2023-05-30 ENCOUNTER — Other Ambulatory Visit (INDEPENDENT_AMBULATORY_CARE_PROVIDER_SITE_OTHER): Payer: Self-pay

## 2023-05-30 DIAGNOSIS — Z23 Encounter for immunization: Secondary | ICD-10-CM

## 2023-06-03 ENCOUNTER — Telehealth: Payer: Self-pay | Admitting: Family Medicine

## 2023-06-03 NOTE — Telephone Encounter (Signed)
 We had received multiple copies for FMLA that we had just completed back in December.  Spoke with patient.  They had some question about the intermittent leave so we refax the original paperwork and added a new intake number on the cover sheet.  Hopefully that will be adequate if not we are happy to update the forms but nothing has really changed and we had asked for coverage through October 2025.

## 2023-06-15 ENCOUNTER — Encounter: Payer: Self-pay | Admitting: Professional

## 2023-06-15 ENCOUNTER — Ambulatory Visit (INDEPENDENT_AMBULATORY_CARE_PROVIDER_SITE_OTHER): Payer: 59 | Admitting: Professional

## 2023-06-15 DIAGNOSIS — F331 Major depressive disorder, recurrent, moderate: Secondary | ICD-10-CM | POA: Diagnosis not present

## 2023-06-15 DIAGNOSIS — F902 Attention-deficit hyperactivity disorder, combined type: Secondary | ICD-10-CM

## 2023-06-15 NOTE — Progress Notes (Signed)
 [facility] Counselor/Therapist Progress Note  Patient ID: April Webster, MRN: [id number],    Date: 06/15/2023  Time Spent: 47 minutes [id number]  Treatment Type: Individual Therapy  Risk Assessment: Danger to Self:  No Self-injurious Behavior: No Danger to Others: No  Subjective:  This session was held via video teletherapy. The patient consented to video teletherapy and was located in her car during this session. She is aware it is the responsibility of the patient to secure confidentiality on her end of the session. The provider was in a private home office for the duration of this session.    The patient arrived late for her Caregility session.  Issues addressed: 1-physical -she currently is well -they advised once two weeks off from flu and steroid to get a pneumonia vac -she had the pneumonia vac -at four week they will check to see her titer score to see if she has immunity 2-shoulder surgery -the cortisone shots that Dr. 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 Alleviate depressive symptoms and return to previous level of effective functioning. Objective Identify and replace thoughts and beliefs that support depression. Target Date: 2023-11-05 Frequency: Biweekly  Progress: 40 Modality: individual  Related Interventions Conduct Cognitive-Behavioral Therapy (see Cognitive Behavior Therapy by Reola Calkins; Overcoming Depression by Agapito Games al.), beginning with helping the client learn the connection among cognition, depressive feelings, and actions. Assign the client to self-monitor thoughts, feelings, and actions in daily journal (e.g., "Negative Thoughts Trigger Negative Feelings" in the Adult Psychotherapy Homework Planner by Stephannie Li; "Daily Record of Dysfunctional Thoughts" in Cognitive Therapy of Depression by Ashby Dawes and Shea Evans); process the journal material to challenge depressive thinking patterns and replace them with reality-based thoughts. Assign "behavioral experiments" in which depressive automatic thoughts are treated as hypotheses/prediction, reality-based alternative hypotheses/prediction are generated, and both are tested against the client's past, present, and/or future experiences. Facilitate and reinforce the client's shift from biased depressive self-talk and beliefs to reality-based cognitive messages that enhance self-confidence and increase adaptive actions (see "Positive Self-Talk" in the Adult Psychotherapy Homework Planner by Stephannie Li). Objective Learn and implement behavioral strategies to overcome depression. Target Date: 2023-11-05 Frequency: Biweekly  Progress: 60 Modality: individual  Related Interventions Engage the client in "behavioral activation," increasing his/her activity level and contact with  sources of reward, while  identifying processes that inhibit activation (see Behavioral Activation for Depression by Katharine Look, Dimidjian, and Herman-Dunn; or assign "Identify and Schedule Pleasant Activities" in the Adult Psychotherapy Homework Planner by [facility]); use behavioral techniques such as instruction, rehearsal, role-playing, role reversal, as needed, to facilitate activity in the client's daily life; reinforce success. Assist the client in developing skills that increase the likelihood of deriving pleasure from behavioral activation (e.g., assertiveness skills, developing an exercise plan, less internal/more external focus, increased social involvement); reinforce success. Objective Learn and implement problem-solving and decision-making skills. Target Date: 2023-11-05 Frequency: Biweekly  Progress: 60 Modality: individual  Related Interventions Conduct Problem-Solving Therapy (see Problem-Solving Therapy by Domenick Bookbinder and Rob Hickman) using techniques such as psychoeducation, modeling, and role-playing to teach client problem-solving skills (i.e., defining a problem specifically, generating possible solutions, evaluating the pros and cons of each solution, selecting and implementing a plan of action, evaluating the efficacy of the plan, accepting or revising the plan); role-play application of the problem-solving skill to a real life issue (or assign "Applying Problem-Solving to Interpersonal Conflict" in the Adult Psychotherapy Homework Planner by Stephannie Li). Objective Learn and implement conflict resolution skills to resolve interpersonal problems. Target Date: 2023-11-05 Frequency: Biweekly  Progress: 50 Modality: individual  Related Interventions Teach conflict resolution skills (e.g., empathy, active listening, "I messages," respectful communication, assertiveness without aggression, compromise); use psychoeducation, modeling, role-playing, and rehearsal to work through several current conflicts;  assign homework exercises; review and repeat so as to integrate their use into the client's life. Help the client resolve depression related to interpersonal problems through the use of reassurance and support, clarification of cognitive and affective triggers that ignite conflicts, and active problem-solving (or assign "Applying Problem-Solving to Interpersonal Conflict" in the Adult Psychotherapy Homework Planner by Stephannie Li). Objective Verbalize an understanding of healthy and unhealthy emotions with the intent of increasing the use of healthy emotions to guide actions. Target Date: 2023-11-05 Frequency: Biweekly  Progress: 50 Modality: individual  Related Interventions Use a process-experiential approach consistent with Emotion-Focused Therapy to create a safe, nurturing environment in which the client can process emotions, learning to identify and regulate unhealthy feelings and to generate more adaptive ones that then guide actions (see Emotion-Focused Therapy for Depression by Peter Minium). 2. Develop healthy thinking patterns and beliefs about self, others, and the world that lead to the alleviation and help prevent the relapse of depression. 3. Develop the necessary skills for effective, open communication, mutually satisfying sexual intimacy, and enjoyable time for companionship within the relationship. 4. Elevate self-esteem. Objective Increase insight into the historical and current sources of low self-esteem. Target Date: 2023-11-05 Frequency: Biweekly  Progress: 50 Modality: individual  Related Interventions Help the client become aware of his/her fear of rejection and its connection with past rejection or abandonment experiences; begin to contrast past experiences of pain with present experiences of acceptance and competence. Objective Identify and replace negative self-talk messages used to reinforce low self-esteem. Target Date: 2023-11-05 Frequency: Biweekly  Progress:  30 Modality: individual  Related Interventions Help the client identify his/her distorted, negative beliefs about self and the world and replace these messages with more realistic, affirmative messages (or assign "Journal and Replace Self-Defeating Thoughts" in the Adult Psychotherapy Homework Planner by [facility] or read What to Say When You Talk to Yourself by Helm[employer]etter). Ask the client to complete and process self-esteem-building exercises from recommended self-help books (e.g., Ten Days to Self Esteem! by Lawerance Bach; The Self-Esteem Companion by Murvin Donning, [place], and [employer]; 10 Simple Solutions for [employer]  by Roney Mans). Objective Identify and engage in activities that would improve self-image by being consistent with one's values. Target Date: 2023-11-05 Frequency: Biweekly  Progress: 50 Modality: individual  Related Interventions Help the client analyze his/her values and the congruence or incongruence between them and the client's daily activities. Identify and assign activities congruent with the client's values; process them toward improving self-concept and self-esteem. Objective Increase the frequency of assertive behaviors. Target Date: 2023-11-05 Frequency: Biweekly  Progress: 70 Modality: individual  Related Interventions Train the client in assertiveness or refer him/her to a group that will educate and facilitate assertiveness skills via lectures and assignments. Objective Demonstrate an increased ability to identify and express personal feelings. Target Date: 2023-11-05 Frequency: Biweekly  Progress: 60 Modality: individual  Related Interventions Assign the client to keep a journal of feelings on a daily basis. Objective Articulate a plan to be proactive in trying to get identified needs met. Target Date: 2023-11-05 Frequency: Biweekly  Progress: 60 Modality: individual  Related Interventions Assist the client in identifying and verbalizing his/her  needs, met and unmet. Assist the client in developing a specific action plan to get each need met (or assign "Satisfying Unmet Emotional Needs" in the Adult Psychotherapy Homework Planner by [facility]). Objective Decrease the frequency of negative self-descriptive statements and increase frequency of positive self-descriptive statements. Target Date: 2023-11-05 Frequency: Biweekly  Progress: 30 Modality: individual  Related Interventions Assist the client in becoming aware of how he/she expresses or acts out negative feelings about himself/herself. Help the client reframe his/her negative assessment of himself/herself. 5. Establish an inward sense of self-worth, confidence, and competence. 6. Increase awareness of own role in the relationship conflicts. Objective Identify problems and strengths in the relationship, including one's own role in each. Target Date: 2023-11-05 Frequency: Biweekly  Progress: 50 Modality: individual  Related Interventions Assess current, ongoing problems in the relationship, including possible abuse/neglect, substance use, communication, conflict resolution, as well as home environment (if domestic violence is present, plan for safety and avoid early use of conjoint sessions; see the Physical Abuse chapter in The Couples Psychotherapy Treatment Planner by Genoveva Ill, and Jongsma). Assess strengths in the relationship that could be enhanced during the therapy to facilitate the accomplishment of therapeutic goals. Objective Make a commitment to change specific behaviors that have been identified by self or the partner. Target Date: 2023-11-05 Frequency: Biweekly  Progress: 30 Modality: individual  Related Interventions Process the list of positive and problematic features of each partner and the relationship; ask couple to agree to work on changes he/she needs to make to improve the relationship, generating a list of targeted changes (or assign "How Can We Meet Each  Other's Needs and Desires?" in the Adult Psychotherapy Homework Planner by Stephannie Li). Objective Increase the frequency of the direct expression of honest, respectful, and positive feelings and thoughts within the relationship. Target Date: 2023-11-05 Frequency: Biweekly  Progress: 50 Modality: individual  Related Interventions Assist the couple in identifying conflicts that can be addressed using communication, conflict-resolution, and/or problem-solving skills (see "Behavioral Marital Therapy" by Loyce Dys). Use behavioral techniques (education, modeling, role-playing, corrective feedback, and positive reinforcement) to teach communication skills including assertive communication, offering positive feedback, active listening, making positive requests of others for behavior change, and giving negative feedback in an honest and respectful manner. Objective Learn and implement problem-solving and conflict resolution skills. Target Date: 2023-11-05 Frequency: Biweekly  Progress: 60 in general; 50 in relationship Modality: individual  Related Interventions Review how newly learned communication skills can  be applied to conflict resolution through calm, respectful, effective dialogue; role-play application of this skill to a present conflict situation. Use behavioral techniques (education, modeling, role-playing, corrective feedback, and positive reinforcement) to teach the couple problem-solving and conflict resolution skills including defining the problem constructively and specifically, brainstorming options, evaluating options, compromise, choosing options and implementing a plan, evaluating the results. Objective Understand the origin of each other's negative emotions and reactions and develop more constructive interactions that fill needs. Target Date: 2023-11-05 Frequency: Biweekly  Progress: 40 Modality: individual  Related Interventions Encourage the clients to recognize,  reframe, and express these insecurities toward resolving negative emotional and behavioral reactions. Assist the clients in developing more constructive interactions that satisfy attachment needs such as increased intimacy and expressions of love (or assign "How Can We Meet Each Other's Needs and Desires?" in the Adult Psychotherapy Homework Planner by Stephannie Li). Objective Increase time spent in enjoyable contact with the partner. Target Date: 2023-11-05 Frequency: Biweekly  Progress: 60 Modality: individual  Related Interventions Assist the couple in identifying and planning rewarding social/recreational activities that can be shared with the partner (or assign "Identify and Schedule Pleasant Activities" in the Adult Psychotherapy Homework Planner by Stephannie Li). Objective Identify any patterns of destructive and/or abusive behavior in the relationship. Target Date: 2023-11-05 Frequency: Biweekly  Progress: 50 Modality: individual  Related Interventions Assess current patterns of destructive and/or abusive behavior for each partner, including those that existed in each family of origin (if domestic violence is present, plan for safety and avoid early use of conjoint sessions; see the Physical Abuse chapter in The Couples Psychotherapy Treatment Planner by Genoveva Ill, and Jongsma). 7. Increase job satisfaction and performance due to implementation of assertiveness and stress management strategies. 8. Increase sense of confidence and competence in dealing with work responsibilities. Objective Implement assertiveness skills. Target Date: 2023-11-05 Frequency: Biweekly  Progress: 60 Modality: individual  Related Interventions Train the client in assertiveness skills or refer to assertiveness training class that teaches effective communication of needs and feelings without aggression or defensiveness. Objective Learn and implement problem-solving skills. Target Date: 2023-11-05 Frequency:  Biweekly  Progress: 70 Modality: individual  Related Interventions Conduct Problem-Solving Therapy (see Problem-Solving Therapy by Domenick Bookbinder and Rob Hickman) using techniques such as psychoeducation, modeling, and role-playing to teach the client problem-solving skills (i.e., defining a problem specifically, generating possible solutions, evaluating the pros and cons of each solution, selecting and implementing a plan of action, evaluating the efficacy of the plan, accepting or revising the plan); role-play application of the problem-solving skill to a real life issue (or assign "Applying Problem-Solving to Interpersonal Conflict" in the Adult Psychotherapy Homework Planner by Stephannie Li). Objective Verbalize healthy, realistic cognitive messages that promote harmony with others, self-acceptance, and self-confidence. Target Date: 2023-11-05 Frequency: Biweekly  Progress: 40 Modality: individual  Related Interventions Teach the client the connection between thoughts, feelings, and behavior; train the client in the development of more realistic, healthy cognitive messages that relieve anxiety and depression. Objective Learn and implement calming skills to reduce overall anxiety and manage anxiety symptoms. Target Date: 2023-11-05 Frequency: Biweekly  Progress: 60 Modality: individual  Related Interventions Teach the client calming/relaxation skills (e.g., applied relaxation, progressive muscle relaxation, cue controlled relaxation, mindful breathing, biofeedback) and how to discriminate better between relaxation and tension; teach the client how to apply these skills to his/her daily life (e.g., New Directions in Progressive Muscle Relaxation by Marcelyn Ditty, and Hazlett-Stevens; The Relaxation and Stress Reduction Workbook by Kevan Rosebush, and [place]). Objective Identify any personal problems  that may be causing conflict in the employment setting. Target Date: 2023-11-05 Frequency: Biweekly   Progress: 50 Modality: individual  Related Interventions Explore the client's transfer of personal problems to the employment situation. Objective Identify the effect that vocational stress has on feelings toward self and relationships with significant others. Target Date: 2023-11-05 Frequency: Biweekly  Progress: 70 Modality: individual  Related Interventions Explore the effect of the client's vocational stress on his/her intra- and interpersonal dynamics with friends and family. Objective Develop and verbalize a plan for constructive action to reduce vocational stress. Target Date: 2023-11-05 Frequency: Biweekly  Progress: 50 Modality: individual  Related Interventions Assist the client in developing a plan to react positively to his/her vocational situation (or assign "My Vocational Action Plan" in the Adult Psychotherapy Homework Planner by [facility]); process the proactive plan and assist in its implementation.  Diagnosis:Moderate episode of recurrent major depressive disorder (HCC)  Attention deficit hyperactivity disorder (ADHD), combined type  Plan:  -next session will be on Wednesday, June 29, 2023 at 1pm.

## 2023-06-17 ENCOUNTER — Telehealth: Payer: Self-pay | Admitting: Family Medicine

## 2023-06-17 ENCOUNTER — Other Ambulatory Visit (HOSPITAL_BASED_OUTPATIENT_CLINIC_OR_DEPARTMENT_OTHER): Payer: Self-pay

## 2023-06-17 DIAGNOSIS — J111 Influenza due to unidentified influenza virus with other respiratory manifestations: Secondary | ICD-10-CM

## 2023-06-17 MED ORDER — OSELTAMIVIR PHOSPHATE 75 MG PO CAPS
75.0000 mg | ORAL_CAPSULE | Freq: Two times a day (BID) | ORAL | 0 refills | Status: DC
  Filled 2023-06-17: qty 10, 5d supply, fill #0

## 2023-06-17 NOTE — Telephone Encounter (Signed)
 URI sxs started today.  Tested positive for flu at work.  Will send in for prescription for Tamiflu. Meds ordered this encounter  Medications   oseltamivir (TAMIFLU) 75 MG capsule    Sig: Take 1 capsule (75 mg total) by mouth 2 (two) times daily.    Dispense:  10 capsule    Refill:  0

## 2023-06-21 ENCOUNTER — Other Ambulatory Visit (HOSPITAL_COMMUNITY): Payer: Self-pay

## 2023-06-21 ENCOUNTER — Other Ambulatory Visit: Payer: Self-pay | Admitting: Family Medicine

## 2023-06-21 ENCOUNTER — Other Ambulatory Visit: Payer: Self-pay

## 2023-06-21 DIAGNOSIS — F5101 Primary insomnia: Secondary | ICD-10-CM

## 2023-06-21 MED ORDER — TRAZODONE HCL 50 MG PO TABS
25.0000 mg | ORAL_TABLET | Freq: Every day | ORAL | 3 refills | Status: DC
  Filled 2023-06-21: qty 60, 30d supply, fill #0
  Filled 2023-07-17: qty 60, 30d supply, fill #1
  Filled 2023-08-16: qty 60, 30d supply, fill #2
  Filled 2023-09-29: qty 60, 30d supply, fill #3

## 2023-06-24 ENCOUNTER — Other Ambulatory Visit: Payer: Self-pay

## 2023-06-24 ENCOUNTER — Other Ambulatory Visit (HOSPITAL_COMMUNITY): Payer: Self-pay

## 2023-06-24 ENCOUNTER — Telehealth: Payer: Self-pay | Admitting: Family Medicine

## 2023-06-24 MED ORDER — LISDEXAMFETAMINE DIMESYLATE 50 MG PO CAPS
50.0000 mg | ORAL_CAPSULE | Freq: Every day | ORAL | 0 refills | Status: DC
  Filled 2023-06-24: qty 30, 30d supply, fill #0

## 2023-06-24 NOTE — Telephone Encounter (Signed)
 Patient reports still struggling with not getting good control of her inattentive symptoms.  She says she physically feels a little bit more calm on the Vyvanse but does not feel like mentally it is really helping her stay focused during the day.  Recommend trial of increased dose to 50 mg.  New prescription sent to pharmacy for 30-day trial.  Meds ordered this encounter  Medications   lisdexamfetamine (VYVANSE) 50 MG capsule    Sig: Take 1 capsule (50 mg total) by mouth daily.    Dispense:  30 capsule    Refill:  0

## 2023-06-29 ENCOUNTER — Ambulatory Visit: Payer: 59 | Admitting: Professional

## 2023-07-04 ENCOUNTER — Other Ambulatory Visit (HOSPITAL_BASED_OUTPATIENT_CLINIC_OR_DEPARTMENT_OTHER): Payer: Self-pay

## 2023-07-04 ENCOUNTER — Other Ambulatory Visit: Payer: Self-pay | Admitting: Sports Medicine

## 2023-07-04 DIAGNOSIS — G8929 Other chronic pain: Secondary | ICD-10-CM

## 2023-07-04 DIAGNOSIS — J988 Other specified respiratory disorders: Secondary | ICD-10-CM | POA: Diagnosis not present

## 2023-07-04 MED ORDER — HYDROCODONE-ACETAMINOPHEN 5-325 MG PO TABS
1.0000 | ORAL_TABLET | ORAL | 0 refills | Status: DC | PRN
  Filled 2023-07-04: qty 30, 5d supply, fill #0

## 2023-07-13 ENCOUNTER — Ambulatory Visit: Payer: 59 | Admitting: Professional

## 2023-07-17 ENCOUNTER — Other Ambulatory Visit: Payer: Self-pay | Admitting: Family Medicine

## 2023-07-18 ENCOUNTER — Other Ambulatory Visit (HOSPITAL_COMMUNITY): Payer: Self-pay

## 2023-07-18 ENCOUNTER — Other Ambulatory Visit: Payer: Self-pay

## 2023-07-18 MED ORDER — MELOXICAM 15 MG PO TABS
15.0000 mg | ORAL_TABLET | Freq: Every day | ORAL | 1 refills | Status: DC
  Filled 2023-07-18: qty 90, 90d supply, fill #0

## 2023-07-18 MED ORDER — ATORVASTATIN CALCIUM 20 MG PO TABS
20.0000 mg | ORAL_TABLET | Freq: Every day | ORAL | 3 refills | Status: AC
  Filled 2023-07-18: qty 90, 90d supply, fill #0
  Filled 2023-10-17: qty 90, 90d supply, fill #1
  Filled 2024-01-18: qty 90, 90d supply, fill #2
  Filled 2024-04-15: qty 90, 90d supply, fill #3

## 2023-07-19 ENCOUNTER — Other Ambulatory Visit: Payer: Self-pay | Admitting: Sports Medicine

## 2023-07-19 ENCOUNTER — Other Ambulatory Visit (HOSPITAL_BASED_OUTPATIENT_CLINIC_OR_DEPARTMENT_OTHER): Payer: Self-pay

## 2023-07-19 MED ORDER — OSELTAMIVIR PHOSPHATE 75 MG PO CAPS
75.0000 mg | ORAL_CAPSULE | Freq: Two times a day (BID) | ORAL | 11 refills | Status: DC
  Filled 2023-07-19: qty 10, 5d supply, fill #0

## 2023-07-27 ENCOUNTER — Other Ambulatory Visit (HOSPITAL_BASED_OUTPATIENT_CLINIC_OR_DEPARTMENT_OTHER): Payer: Self-pay

## 2023-07-27 ENCOUNTER — Ambulatory Visit: Admitting: Professional

## 2023-07-27 ENCOUNTER — Other Ambulatory Visit: Payer: Self-pay | Admitting: Sports Medicine

## 2023-07-27 MED ORDER — PREDNISONE 50 MG PO TABS
50.0000 mg | ORAL_TABLET | Freq: Every day | ORAL | 0 refills | Status: DC
  Filled 2023-07-27: qty 5, 5d supply, fill #0

## 2023-08-01 ENCOUNTER — Other Ambulatory Visit (HOSPITAL_BASED_OUTPATIENT_CLINIC_OR_DEPARTMENT_OTHER): Payer: Self-pay

## 2023-08-01 ENCOUNTER — Other Ambulatory Visit: Payer: Self-pay | Admitting: *Deleted

## 2023-08-01 MED ORDER — TRULANCE 3 MG PO TABS
3.0000 mg | ORAL_TABLET | Freq: Every day | ORAL | 3 refills | Status: AC
  Filled 2023-08-01 – 2023-08-03 (×2): qty 90, 90d supply, fill #0
  Filled 2023-10-17: qty 90, 90d supply, fill #1
  Filled 2024-01-18: qty 90, 90d supply, fill #2

## 2023-08-01 NOTE — Telephone Encounter (Signed)
 Pt asked if Dr. Greer Leak could start writing for her Trulance  3 mg. She usually gets this from GI.  Also she wanted her to know about some tremors that she was starting to experience even when she is sitting still.

## 2023-08-02 ENCOUNTER — Other Ambulatory Visit: Payer: Self-pay | Admitting: Family Medicine

## 2023-08-02 ENCOUNTER — Other Ambulatory Visit (HOSPITAL_BASED_OUTPATIENT_CLINIC_OR_DEPARTMENT_OTHER): Payer: Self-pay

## 2023-08-02 DIAGNOSIS — R0683 Snoring: Secondary | ICD-10-CM

## 2023-08-02 NOTE — Progress Notes (Signed)
 Orders Placed This Encounter  Procedures   Home sleep test    Standing Status:   Future    Expiration Date:   08/01/2024    Scheduling Instructions:     Snoring.    Where should this test be performed::   [facility]

## 2023-08-03 ENCOUNTER — Other Ambulatory Visit (HOSPITAL_COMMUNITY): Payer: Self-pay

## 2023-08-03 ENCOUNTER — Other Ambulatory Visit: Payer: Self-pay

## 2023-08-03 ENCOUNTER — Other Ambulatory Visit (HOSPITAL_BASED_OUTPATIENT_CLINIC_OR_DEPARTMENT_OTHER): Payer: Self-pay

## 2023-08-03 ENCOUNTER — Other Ambulatory Visit: Payer: Self-pay | Admitting: Family Medicine

## 2023-08-03 MED ORDER — LISDEXAMFETAMINE DIMESYLATE 50 MG PO CAPS
50.0000 mg | ORAL_CAPSULE | Freq: Every day | ORAL | 0 refills | Status: DC
  Filled 2023-08-03: qty 30, 30d supply, fill #0

## 2023-08-04 ENCOUNTER — Other Ambulatory Visit: Payer: Self-pay

## 2023-08-04 ENCOUNTER — Other Ambulatory Visit (HOSPITAL_COMMUNITY): Payer: Self-pay

## 2023-08-10 ENCOUNTER — Other Ambulatory Visit (HOSPITAL_COMMUNITY): Payer: Self-pay

## 2023-08-10 ENCOUNTER — Ambulatory Visit: Admitting: Professional

## 2023-08-16 ENCOUNTER — Other Ambulatory Visit: Payer: Self-pay

## 2023-08-18 ENCOUNTER — Encounter (HOSPITAL_BASED_OUTPATIENT_CLINIC_OR_DEPARTMENT_OTHER): Payer: Self-pay | Admitting: Orthopaedic Surgery

## 2023-08-18 ENCOUNTER — Other Ambulatory Visit: Payer: Self-pay

## 2023-08-24 ENCOUNTER — Ambulatory Visit: Admitting: Professional

## 2023-08-24 ENCOUNTER — Encounter (HOSPITAL_BASED_OUTPATIENT_CLINIC_OR_DEPARTMENT_OTHER): Payer: Self-pay | Admitting: Orthopaedic Surgery

## 2023-08-24 NOTE — H&P (Signed)
 PREOPERATIVE H&P  Chief Complaint: Articular cartilage disorder of shoulder, left, Primary osteoarthritis of left shoulder, Bicipital tendinitis of shoulder, left ,Bursitis of shoulder, left  HPI: April Webster is a 48 y.o. female who is scheduled for, Procedure(s): ARTHROSCOPY, SHOULDER WITH DEBRIDEMENT.   Patient has had left shoulder pain for quite some time. She has tried injections and physical therapy. She has only gotten temporary relief from bicipital sheath injections.   Symptoms are rated as moderate to severe, and have been worsening.  This is significantly impairing activities of daily living.    Please see clinic note for further details on this patient's care.    She has elected for surgical management.   Past Medical History:  Diagnosis Date   Chronic idiopathic constipation    DDD (degenerative disc disease), cervical and lumbar    Female pattern hair loss    Graves disease    Hyperlipidemia    Hypertension    Hypothyroidism    Lichen sclerosus    Raynaud's disease    Past Surgical History:  Procedure Laterality Date   ABDOMINOPLASTY     AUGMENTATION MAMMAPLASTY Bilateral 05/2019   Silicone    CHOLECYSTECTOMY     DILITATION & CURRETTAGE/HYSTROSCOPY WITH NOVASURE ABLATION N/A 02/09/2022   Procedure: DILATATION & CURETTAGE/HYSTEROSCOPY WITH NOVASURE ABLATION, REMOVAL OF IUD;  Surgeon: Lacey Pian, MD;  Location: [facility] OR;  Service: Gynecology;  Laterality: N/A;   PLACEMENT OF BREAST IMPLANTS     THYROID  SURGERY     TUBAL LIGATION     Social History   Socioeconomic History   Marital status: Married    Spouse name: Not on file   Number of children: 3   Years of education: Not on file   Highest education level: Doctorate  Occupational History   Occupation: NP at [employer]  Tobacco Use   Smoking status: Former    Current packs/day: 0.00    Average packs/day: 2.0 packs/day for 5.0 years (10.0 ttl pk-yrs)    Types: Cigarettes    Start date:  03/16/1991    Quit date: 03/15/1996    Years since quitting: 27.4   Smokeless tobacco: Never  Vaping Use   Vaping status: Never Used  Substance and Sexual Activity   Alcohol use: Yes    Alcohol/week: 2.0 standard drinks of alcohol    Types: 2 Glasses of wine per week    Comment: occ   Drug use: Never   Sexual activity: Yes    Partners: Male    Birth control/protection: I.U.D.  Other Topics Concern   Not on file  Social History Narrative   Not on file   Social Drivers of Health   Financial Resource Strain: Low Risk  (07/20/2022)   Overall Financial Resource Strain (CARDIA)    Difficulty of Paying Living Expenses: Not hard at all  Food Insecurity: No Food Insecurity (07/20/2022)   Hunger Vital Sign    Worried About Running Out of Food in the Last Year: Never true    Ran Out of Food in the Last Year: Never true  Transportation Needs: No Transportation Needs (07/20/2022)   PRAPARE - Administrator, Civil Service (Medical): No    Lack of Transportation (Non-Medical): No  Physical Activity: Sufficiently Active (07/20/2022)   Exercise Vital Sign    Days of Exercise per Week: 5 days    Minutes of Exercise per Session: 30 min  Stress: No Stress Concern Present (07/20/2022)   [employer] of Occupational  Health - Occupational Stress Questionnaire    Feeling of Stress : Only a little  Social Connections: Socially Isolated (07/20/2022)   Social Connection and Isolation Panel [NHANES]    Frequency of Communication with Friends and Family: Once a week    Frequency of Social Gatherings with Friends and Family: Once a week    Attends Religious Services: Never    Database administrator or Organizations: No    Attends Engineer, structural: Not on file    Marital Status: Married   Family History  Problem Relation Age of Onset   Hypertension Mother    Hyperlipidemia Mother    Osteoporosis Mother    Lung cancer Father    Heart attack Maternal Grandmother    Stomach cancer  Maternal Grandfather    Colon cancer Neg Hx    Rectal cancer Neg Hx    Liver cancer Neg Hx    Esophageal cancer Neg Hx    Pancreatic cancer Neg Hx    Allergies  Allergen Reactions   Tuberculin Purified Protein Derivative Rash    Erythema and itching without induration   Ambien [Zolpidem] Other (See Comments)    Sleep talking   Cymbalta [Duloxetine Hcl] Other (See Comments)    Urinary retention.    Fluoxetine  Other (See Comments)    Sexual dysfunction   Tape Dermatitis and Rash   Wound Dressing Adhesive Dermatitis and Rash   Prior to Admission medications   Medication Sig Start Date End Date Taking? Authorizing Provider  albuterol  (VENTOLIN  HFA) 108 (90 Base) MCG/ACT inhaler Inhale 2 puffs into the lungs every 6 (six) hours as needed for wheezing or shortness of breath. 06/10/21  Yes Cydney Draft, MD  amLODipine  (NORVASC ) 2.5 MG tablet Take 1 tablet (2.5 mg total) by mouth daily. 07/20/22  Yes Cydney Draft, MD  atorvastatin  (LIPITOR) 20 MG tablet Take 1 tablet (20 mg total) by mouth daily. 07/18/23 07/17/24 Yes Cydney Draft, MD  finasteride  (PROSCAR ) 5 MG tablet Take 0.5 tablets (2.5 mg total) by mouth daily. 05/16/23  Yes Cydney Draft, MD  hydrochlorothiazide  (HYDRODIURIL ) 25 MG tablet Take 1 tablet (25 mg total) by mouth daily as needed. 02/28/23  Yes Cydney Draft, MD  HYDROcodone -acetaminophen  (NORCO/VICODIN) 5-325 MG tablet Take 1 tablet by mouth every 4 (four) hours as needed for moderate pain (pain score 4-6). 07/04/23  Yes Gean Keels, MD  levothyroxine  (SYNTHROID ) 125 MCG tablet Take 1 tablet (125 mcg total) by mouth daily before breakfast. 2 days per week. 12/15/22  Yes Cydney Draft, MD  lisdexamfetamine (VYVANSE ) 50 MG capsule Take 1 capsule (50 mg total) by mouth daily. 08/03/23  Yes Cydney Draft, MD  meloxicam  (MOBIC ) 15 MG tablet Take 1 tablet (15 mg total) by mouth daily. 07/18/23  Yes Cydney Draft, MD   Multiple Vitamins-Minerals (MULTIVITAMIN WITH MINERALS) tablet Take 1 tablet by mouth daily.   Yes [provider]  pantoprazole  (PROTONIX ) 20 MG tablet Take 1 tablet (20 mg total) by mouth daily. 04/08/23  Yes Cydney Draft, MD  Plecanatide  (TRULANCE ) 3 MG TABS Take 1 tablet (3 mg total) by mouth daily. 08/01/23  Yes Cydney Draft, MD  pregabalin  (LYRICA ) 150 MG capsule Take 1 capsule (150 mg total) by mouth 3 (three) times daily. 12/06/22  Yes Gean Keels, MD  sennosides-docusate sodium (SENOKOT-S) 8.6-50 MG tablet Take 1 tablet by mouth daily as needed for constipation.   Yes [provider]  SYNTHROID   112 MCG tablet TAKE 1 TABLET DAILY BEFORE BREAKFAST FOR HYPOTHYROIDISM 05/13/23  Yes Cydney Draft, MD  tiZANidine  (ZANAFLEX ) 4 MG tablet Take 1 tablet (4 mg total) by mouth at bedtime. 09/22/22  Yes Gean Keels, MD  traZODone  (DESYREL ) 50 MG tablet Take 0.5-2 tablets (25-100 mg total) by mouth at bedtime. 06/21/23  Yes Cydney Draft, MD  venlafaxine  XR (EFFEXOR  XR) 150 MG 24 hr capsule Take 1 capsule (150 mg total) by mouth daily with breakfast. 05/09/23  Yes Cydney Draft, MD  diazepam  (VALIUM ) 5 MG tablet Take 0.5-1 tablets (2.5-5 mg total) by mouth at bedtime as needed for muscle spasms. 04/13/23   Cydney Draft, MD  ondansetron  (ZOFRAN ) 4 MG tablet Take 1 tablet (4 mg total) by mouth every 8 (eight) hours as needed for nausea or vomiting. 06/25/22   Cydney Draft, MD  oseltamivir  (TAMIFLU ) 75 MG capsule Take 1 capsule (75 mg total) by mouth 2 (two) times daily. 07/19/23   Gean Keels, MD  predniSONE  (DELTASONE ) 50 MG tablet Take 1 tablet (50 mg total) by mouth daily for 5 days. 07/27/23   Gean Keels, MD    ROS: All other systems have been reviewed and were otherwise negative with the exception of those mentioned in the HPI and as above.  Physical Exam: General: Alert, no acute  distress Cardiovascular: No pedal edema Respiratory: No cyanosis, no use of accessory musculature GI: No organomegaly, abdomen is soft and non-tender Skin: No lesions in the area of chief complaint Neurologic: Sensation intact distally Psychiatric: Patient is competent for consent with normal mood and affect Lymphatic: No axillary or cervical lymphadenopathy  MUSCULOSKELETAL:  Left shoulder: Full ROM. Intact cuff strength. Positive Speed's and O'Briens. TTP bicipital groove. Positive AC tenderness to palpation. Positive impingement  Imaging: MRI demonstrates significant fluid around the biceps. AC arthritis with cystic changes and edema in distal clavicle and lateral hanging acromial spur  Assessment: Articular cartilage disorder of shoulder, left, Primary osteoarthritis of left shoulder, Bicipital tendinitis of shoulder, left ,Bursitis of shoulder, left  Plan: Plan for Procedure(s): ARTHROSCOPY, SHOULDER WITH DEBRIDEMENT  The risks benefits and alternatives were discussed with the patient including but not limited to the risks of nonoperative treatment, versus surgical intervention including infection, bleeding, nerve injury,  blood clots, cardiopulmonary complications, morbidity, mortality, among others, and they were willing to proceed.   The patient acknowledged the explanation, agreed to proceed with the plan and consent was signed.   Operative Plan: Left shoulder scope with SAD, DCE, BT Discharge Medications: standard DVT Prophylaxis: none Physical Therapy: outpatient PT Special Discharge needs: Sling. IceMan   Adine Ahmadi, PA-C  08/24/2023 3:47 PM

## 2023-08-24 NOTE — Discharge Instructions (Addendum)
 Grafton Lawrence MD, MPH Nicholas Bari, PA-C [facility] [address], [address] [phone] (tel)   [phone] (fax)   POST-OPERATIVE INSTRUCTIONS - SHOULDER ARTHROSCOPY  WOUND CARE You may remove the Operative Dressing on Post-Op Day #3 (72hrs after surgery).   Alternatively if you would like you can leave dressing on until follow-up if within 7-8 days but keep it dry. Leave steri-strips in place until they fall off on their own, usually 2 weeks postop. There may be a small amount of fluid/bleeding leaking at the surgical site.  This is normal; the shoulder is filled with fluid during the procedure and can leak for 24-48hrs after surgery.  You may change/reinforce the bandage as needed.  Use the Cryocuff or Ice as often as possible for the first 7 days, then as needed for pain relief. Always keep a towel, ACE wrap or other barrier between the cooling unit and your skin.  You may shower on Post-Op Day #3. Gently pat the area dry.  Do not soak the shoulder in water or submerge it.  Keep incisions as dry as possible. Do not go swimming in the pool or ocean until 4 weeks after surgery or when otherwise instructed.    EXERCISES Wear the sling at all times  You may remove the sling for showering, but keep the arm across the chest or in a secondary sling.     It is normal for your fingers/hand to become more swollen after surgery and discolored from bruising.   This will resolve over the first few weeks usually after surgery. Please continue to ambulate and do not stay sitting or lying for too long.  Perform foot and wrist pumps to assist in circulation.  PHYSICAL THERAPY - You will begin physical therapy soon after surgery (unless otherwise specified) - Please call to set up an appointment, if you do not already have one  - Let our office if there are any issues with scheduling your therapy   - A PT referral was sent to [facility] in  [place]  REGIONAL ANESTHESIA (NERVE BLOCKS) The anesthesia team may have performed a nerve block for you this is a great tool used to minimize pain.   The block may start wearing off overnight (between 8-24 hours postop) When the block wears off, your pain may go from nearly zero to the pain you would have had postop without the block. This is an abrupt transition but nothing dangerous is happening.   This can be a challenging period but utilize your as needed pain medications to try and manage this period. We suggest you use the pain medication the first night prior to going to bed, to ease this transition.  You may take an extra dose of narcotic when this happens if needed  POST-OP MEDICATIONS- Multimodal approach to pain control In general your pain will be controlled with a combination of substances.  Prescriptions unless otherwise discussed are electronically sent to your pharmacy.  This is a carefully made plan we use to minimize narcotic use.     Meloxicam  - Anti-inflammatory medication taken on a scheduled basis Acetaminophen  - Non-narcotic pain medicine taken on a scheduled basis  Oxycodone  - This is a strong narcotic, to be used only on an "as needed" basis for SEVERE pain. Zofran  - take as needed for nausea   FOLLOW-UP If you develop a Fever (>=101.5), Redness or Drainage from the surgical incision site, please call our office to arrange for an evaluation.  Please call the office to schedule a follow-up appointment for your first post-operative appointment, 7-10 days post-operatively.    HELPFUL INFORMATION   You may be more comfortable sleeping in a semi-seated position the first few nights following surgery.  Keep a pillow propped under the elbow and forearm for comfort.  If you have a recliner type of chair it might be beneficial.  If not that is fine too, but it would be helpful to sleep propped up with pillows behind your operated shoulder as well under your elbow and  forearm.  This will reduce pulling on the suture lines.  When dressing, put your operative arm in the sleeve first.  When getting undressed, take your operative arm out last.  Loose fitting, button-down shirts are recommended.  Often in the first days after surgery you may be more comfortable keeping your operative arm under your shirt and not through the sleeve.  You may return to work/school in the next couple of days when you feel up to it.  Desk work and typing in the sling is fine.  We suggest you use the pain medication the first night prior to going to bed, in order to ease any pain when the anesthesia wears off. You should avoid taking pain medications on an empty stomach as it will make you nauseous.  You should wean off your narcotic medicines as soon as you are able.  Most patients will be off narcotics before their first postop appointment.   Do not drink alcoholic beverages or take illicit drugs when taking pain medications.  It is against the law to drive while taking narcotics.  In some states it is against the law to drive while your arm is in a sling.   Pain medication may make you constipated.  Below are a few solutions to try in this order: Decrease the amount of pain medication if you aren't having pain. Drink lots of decaffeinated fluids. Drink prune juice and/or eat dried prunes  If the first 3 don't work start with additional solutions Take Colace - an over-the-counter stool softener Take Senokot - an over-the-counter laxative Take Miralax - a stronger over-the-counter laxative  For more information including helpful videos and documents visit our website:   [email or web address]     Post Anesthesia Home Care Instructions  Activity: Get plenty of rest for the remainder of the day. A responsible individual must stay with you for 24 hours following the procedure.  For the next 24 hours, DO NOT: -Drive a car -Social worker -Drink alcoholic beverages -Take any medication unless instructed by your physician -Make any legal decisions or sign important papers.  Meals: Start with liquid foods such as gelatin or soup. Progress to regular foods as tolerated. Avoid greasy, spicy, heavy foods. If nausea and/or vomiting occur, drink only clear liquids until the nausea and/or vomiting subsides. Call your physician if vomiting continues.  Special Instructions/Symptoms: Your throat may feel dry or sore from the anesthesia or the breathing tube placed in your throat during surgery. If this causes discomfort, gargle with warm salt water. The discomfort should disappear within 24 hours.     Regional Anesthesia Blocks  1. You may not be able to move or feel the blocked extremity after a regional anesthetic block. This may last may last from 3-48 hours after placement, but it will go away. The length of time depends on the medication injected and your individual response to the medication. As the nerves start to wake up, you  may experience tingling as the movement and feeling returns to your extremity. If the numbness and inability to move your extremity has not gone away after 48 hours, please call your surgeon.   2. The extremity that is blocked will need to be protected until the numbness is gone and the strength has returned. Because you cannot feel it, you will need to take extra care to avoid injury. Because it may be weak, you may have difficulty moving it or using it. You may not know what position it is in without looking at it while the block is in effect.  3. For blocks in the legs and feet, returning to weight bearing and walking needs to be done carefully. You will need to wait until the numbness is entirely gone and the strength has returned. You should be able to move your leg and foot normally before you try and bear weight or walk. You will need someone to be with you when you first try to ensure you do  not fall and possibly risk injury.  4. Bruising and tenderness at the needle site are common side effects and will resolve in a few days.  5. Persistent numbness or new problems with movement should be communicated to the surgeon or the [facility] [phone] [facility] [phone]).

## 2023-08-24 NOTE — Anesthesia Preprocedure Evaluation (Addendum)
 Anesthesia Evaluation  Patient identified by MRN, date of birth, ID band Patient awake    Reviewed: Allergy & Precautions, NPO status , Patient's Chart, lab work & pertinent test results  Airway Mallampati: II       Dental no notable dental hx. (+) Teeth Intact, Dental Advisory Given   Pulmonary former smoker   Pulmonary exam normal breath sounds clear to auscultation       Cardiovascular hypertension, Pt. on medications Normal cardiovascular exam Rhythm:Regular Rate:Normal  Raynaud's disease   Neuro/Psych  PSYCHIATRIC DISORDERS  Depression       GI/Hepatic Neg liver ROS,GERD  Medicated,,  Endo/Other  Hypothyroidism  Hx/o Grave's disease  Renal/GU negative Renal ROS  negative genitourinary   Musculoskeletal Articular cartilage disorder of left shoulder  Primary osteoarthritis of left shoulder Bicipital tendinitis of left shoulder Bursitis of left shoulder DDD lumbar spine   Abdominal   Peds  Hematology negative hematology ROS (+)   Anesthesia Other Findings   Reproductive/Obstetrics                              Anesthesia Physical Anesthesia Plan  ASA: 2  Anesthesia Plan:    Post-op Pain Management: Minimal or no pain anticipated, Regional block*, Precedex and Tylenol  PO (pre-op)*   Induction: Intravenous  PONV Risk Score and Plan: 3 and Treatment may vary due to age or medical condition, Ondansetron , Midazolam  and Dexamethasone   Airway Management Planned: Oral ETT  Additional Equipment: None  Intra-op Plan:   Post-operative Plan: Extubation in OR  Informed Consent: I have reviewed the patients History and Physical, chart, labs and discussed the procedure including the risks, benefits and alternatives for the proposed anesthesia with the patient or authorized representative who has indicated his/her understanding and acceptance.     Dental advisory given  Plan Discussed  with: CRNA and Anesthesiologist  Anesthesia Plan Comments:          Anesthesia Quick Evaluation

## 2023-08-25 ENCOUNTER — Encounter (HOSPITAL_BASED_OUTPATIENT_CLINIC_OR_DEPARTMENT_OTHER)
Admission: RE | Disposition: A | Discharge status: Home / Self Care | Payer: Self-pay | Source: Home / Self Care | Attending: Orthopaedic Surgery

## 2023-08-25 ENCOUNTER — Other Ambulatory Visit: Payer: Self-pay

## 2023-08-25 ENCOUNTER — Other Ambulatory Visit (HOSPITAL_BASED_OUTPATIENT_CLINIC_OR_DEPARTMENT_OTHER): Payer: Self-pay

## 2023-08-25 ENCOUNTER — Ambulatory Visit (HOSPITAL_BASED_OUTPATIENT_CLINIC_OR_DEPARTMENT_OTHER): Admitting: Anesthesiology

## 2023-08-25 ENCOUNTER — Encounter (HOSPITAL_BASED_OUTPATIENT_CLINIC_OR_DEPARTMENT_OTHER): Payer: Self-pay | Admitting: Orthopaedic Surgery

## 2023-08-25 ENCOUNTER — Ambulatory Visit (HOSPITAL_BASED_OUTPATIENT_CLINIC_OR_DEPARTMENT_OTHER)
Admission: RE | Admit: 2023-08-25 | Discharge: 2023-08-25 | Disposition: A | Discharge status: Home / Self Care | Attending: Orthopaedic Surgery | Admitting: Orthopaedic Surgery

## 2023-08-25 DIAGNOSIS — I7300 Raynaud's syndrome without gangrene: Secondary | ICD-10-CM | POA: Insufficient documentation

## 2023-08-25 DIAGNOSIS — M719 Bursopathy, unspecified: Secondary | ICD-10-CM | POA: Diagnosis not present

## 2023-08-25 DIAGNOSIS — S43432A Superior glenoid labrum lesion of left shoulder, initial encounter: Secondary | ICD-10-CM | POA: Diagnosis not present

## 2023-08-25 DIAGNOSIS — M25812 Other specified joint disorders, left shoulder: Secondary | ICD-10-CM | POA: Insufficient documentation

## 2023-08-25 DIAGNOSIS — Z87891 Personal history of nicotine dependence: Secondary | ICD-10-CM | POA: Insufficient documentation

## 2023-08-25 DIAGNOSIS — K219 Gastro-esophageal reflux disease without esophagitis: Secondary | ICD-10-CM | POA: Insufficient documentation

## 2023-08-25 DIAGNOSIS — I10 Essential (primary) hypertension: Secondary | ICD-10-CM | POA: Insufficient documentation

## 2023-08-25 DIAGNOSIS — X58XXXA Exposure to other specified factors, initial encounter: Secondary | ICD-10-CM | POA: Diagnosis not present

## 2023-08-25 DIAGNOSIS — M19012 Primary osteoarthritis, left shoulder: Secondary | ICD-10-CM | POA: Insufficient documentation

## 2023-08-25 DIAGNOSIS — M24112 Other articular cartilage disorders, left shoulder: Secondary | ICD-10-CM | POA: Insufficient documentation

## 2023-08-25 DIAGNOSIS — G8918 Other acute postprocedural pain: Secondary | ICD-10-CM | POA: Diagnosis not present

## 2023-08-25 DIAGNOSIS — M7542 Impingement syndrome of left shoulder: Secondary | ICD-10-CM | POA: Diagnosis not present

## 2023-08-25 DIAGNOSIS — F32A Depression, unspecified: Secondary | ICD-10-CM | POA: Insufficient documentation

## 2023-08-25 DIAGNOSIS — M7522 Bicipital tendinitis, left shoulder: Secondary | ICD-10-CM | POA: Diagnosis not present

## 2023-08-25 DIAGNOSIS — E890 Postprocedural hypothyroidism: Secondary | ICD-10-CM | POA: Diagnosis not present

## 2023-08-25 DIAGNOSIS — Z79899 Other long term (current) drug therapy: Secondary | ICD-10-CM | POA: Insufficient documentation

## 2023-08-25 HISTORY — PX: POSTERIOR LUMBAR FUSION 2 WITH HARDWARE REMOVAL: SHX7297

## 2023-08-25 LAB — POCT PREGNANCY, URINE: Preg Test, Ur: NEGATIVE

## 2023-08-25 SURGERY — ARTHROSCOPY, SHOULDER WITH DEBRIDEMENT
Anesthesia: General | Site: Shoulder | Laterality: Left

## 2023-08-25 MED ORDER — PHENYLEPHRINE 80 MCG/ML (10ML) SYRINGE FOR IV PUSH (FOR BLOOD PRESSURE SUPPORT)
PREFILLED_SYRINGE | INTRAVENOUS | Status: AC
Start: 2023-08-25 — End: 2023-08-25
  Filled 2023-08-25: qty 10

## 2023-08-25 MED ORDER — DEXAMETHASONE SODIUM PHOSPHATE 10 MG/ML IJ SOLN
INTRAMUSCULAR | Status: AC
Start: 2023-08-25 — End: 2023-08-25
  Filled 2023-08-25: qty 1

## 2023-08-25 MED ORDER — ROCURONIUM BROMIDE 10 MG/ML (PF) SYRINGE
PREFILLED_SYRINGE | INTRAVENOUS | Status: AC
Start: 2023-08-25 — End: 2023-08-25
  Filled 2023-08-25: qty 10

## 2023-08-25 MED ORDER — ONDANSETRON HCL 4 MG PO TABS
4.0000 mg | ORAL_TABLET | Freq: Three times a day (TID) | ORAL | 0 refills | Status: AC | PRN
  Filled 2023-08-25: qty 10, 4d supply, fill #0

## 2023-08-25 MED ORDER — LIDOCAINE 2% (20 MG/ML) 5 ML SYRINGE
INTRAMUSCULAR | Status: AC
  Filled 2023-08-25: qty 5

## 2023-08-25 MED ORDER — BUPIVACAINE HCL (PF) 0.5 % IJ SOLN
INTRAMUSCULAR | Status: DC | PRN
  Administered 2023-08-25: 15 mL via PERINEURAL

## 2023-08-25 MED ORDER — SODIUM CHLORIDE 0.9 % IR SOLN
Status: DC | PRN
  Administered 2023-08-25: 3000 mL

## 2023-08-25 MED ORDER — FENTANYL CITRATE (PF) 100 MCG/2ML IJ SOLN
50.0000 ug | Freq: Once | INTRAMUSCULAR | Status: AC
  Administered 2023-08-25: 50 ug via INTRAVENOUS

## 2023-08-25 MED ORDER — OXYCODONE HCL 5 MG/5ML PO SOLN: 5.0000 mg | Freq: Once | ORAL | Status: DC | PRN

## 2023-08-25 MED ORDER — ACETAMINOPHEN 500 MG PO TABS
1000.0000 mg | ORAL_TABLET | Freq: Three times a day (TID) | ORAL | 0 refills | Status: AC
  Filled 2023-08-25: qty 100, 17d supply, fill #0

## 2023-08-25 MED ORDER — OXYCODONE HCL 5 MG PO TABS
ORAL_TABLET | ORAL | 0 refills | Status: AC
  Filled 2023-08-25: qty 24, 4d supply, fill #0

## 2023-08-25 MED ORDER — PHENYLEPHRINE 80 MCG/ML (10ML) SYRINGE FOR IV PUSH (FOR BLOOD PRESSURE SUPPORT)
PREFILLED_SYRINGE | INTRAVENOUS | Status: AC
  Filled 2023-08-25: qty 10

## 2023-08-25 MED ORDER — HYDROMORPHONE HCL 1 MG/ML IJ SOLN: 0.2500 mg | INTRAMUSCULAR | Status: DC | PRN

## 2023-08-25 MED ORDER — ONDANSETRON HCL 4 MG/2ML IJ SOLN: 4.0000 mg | Freq: Once | INTRAMUSCULAR | Status: DC | PRN

## 2023-08-25 MED ORDER — ACETAMINOPHEN 500 MG PO TABS
1000.0000 mg | ORAL_TABLET | Freq: Once | ORAL | Status: AC
  Administered 2023-08-25: 1000 mg via ORAL

## 2023-08-25 MED ORDER — ACETAMINOPHEN 500 MG PO TABS
ORAL_TABLET | ORAL | Status: AC
Start: 2023-08-25 — End: 2023-08-25
  Filled 2023-08-25: qty 2

## 2023-08-25 MED ORDER — MELOXICAM 15 MG PO TABS
15.0000 mg | ORAL_TABLET | Freq: Every day | ORAL | 0 refills | Status: DC
  Filled 2023-08-25: qty 30, 30d supply, fill #0

## 2023-08-25 MED ORDER — SUGAMMADEX SODIUM 200 MG/2ML IV SOLN
INTRAVENOUS | Status: DC | PRN
  Administered 2023-08-25: 300 mg via INTRAVENOUS

## 2023-08-25 MED ORDER — MIDAZOLAM HCL 2 MG/2ML IJ SOLN
INTRAMUSCULAR | Status: AC
  Filled 2023-08-25: qty 2

## 2023-08-25 MED ORDER — CEFAZOLIN SODIUM-DEXTROSE 2-4 GM/100ML-% IV SOLN
2.0000 g | INTRAVENOUS | Status: AC
  Administered 2023-08-25: 2 g via INTRAVENOUS

## 2023-08-25 MED ORDER — FENTANYL CITRATE (PF) 100 MCG/2ML IJ SOLN
INTRAMUSCULAR | Status: AC
Start: 2023-08-25 — End: 2023-08-25
  Filled 2023-08-25: qty 2

## 2023-08-25 MED ORDER — ETOMIDATE 2 MG/ML IV SOLN
INTRAVENOUS | Status: AC
  Filled 2023-08-25: qty 10

## 2023-08-25 MED ORDER — OXYCODONE HCL 5 MG PO TABS: 5.0000 mg | ORAL_TABLET | Freq: Once | ORAL | Status: DC | PRN

## 2023-08-25 MED ORDER — EPHEDRINE 5 MG/ML INJ
INTRAVENOUS | Status: AC
Start: 2023-08-25 — End: 2023-08-25
  Filled 2023-08-25: qty 5

## 2023-08-25 MED ORDER — GABAPENTIN 300 MG PO CAPS
300.0000 mg | ORAL_CAPSULE | Freq: Once | ORAL | Status: AC
  Administered 2023-08-25: 300 mg via ORAL

## 2023-08-25 MED ORDER — LACTATED RINGERS IV SOLN: INTRAVENOUS | Status: DC

## 2023-08-25 MED ORDER — PHENYLEPHRINE HCL (PRESSORS) 10 MG/ML IV SOLN
INTRAVENOUS | Status: DC | PRN
  Administered 2023-08-25: 80 ug via INTRAVENOUS
  Administered 2023-08-25: 160 ug via INTRAVENOUS
  Administered 2023-08-25 (×3): 80 ug via INTRAVENOUS
  Administered 2023-08-25: 160 ug via INTRAVENOUS
  Administered 2023-08-25 (×2): 80 ug via INTRAVENOUS
  Administered 2023-08-25: 40 ug via INTRAVENOUS
  Administered 2023-08-25: 80 ug via INTRAVENOUS

## 2023-08-25 MED ORDER — DEXAMETHASONE SODIUM PHOSPHATE 10 MG/ML IJ SOLN
INTRAMUSCULAR | Status: DC | PRN
  Administered 2023-08-25: 10 mg via INTRAVENOUS

## 2023-08-25 MED ORDER — EPHEDRINE 5 MG/ML INJ
INTRAVENOUS | Status: AC
  Filled 2023-08-25: qty 5

## 2023-08-25 MED ORDER — FENTANYL CITRATE (PF) 100 MCG/2ML IJ SOLN
INTRAMUSCULAR | Status: DC | PRN
  Administered 2023-08-25: 100 ug via INTRAVENOUS

## 2023-08-25 MED ORDER — GABAPENTIN 300 MG PO CAPS
ORAL_CAPSULE | ORAL | Status: AC
  Filled 2023-08-25: qty 1

## 2023-08-25 MED ORDER — MIDAZOLAM HCL 2 MG/2ML IJ SOLN
1.0000 mg | Freq: Once | INTRAMUSCULAR | Status: AC
  Administered 2023-08-25: 1 mg via INTRAVENOUS

## 2023-08-25 MED ORDER — CEFAZOLIN SODIUM-DEXTROSE 2-4 GM/100ML-% IV SOLN
INTRAVENOUS | Status: AC
  Filled 2023-08-25: qty 100

## 2023-08-25 MED ORDER — LIDOCAINE 2% (20 MG/ML) 5 ML SYRINGE
INTRAMUSCULAR | Status: DC | PRN
  Administered 2023-08-25: 60 mg via INTRAVENOUS

## 2023-08-25 MED ORDER — DROPERIDOL 2.5 MG/ML IJ SOLN: 0.6250 mg | Freq: Once | INTRAMUSCULAR | Status: DC | PRN

## 2023-08-25 MED ORDER — ONDANSETRON HCL 4 MG/2ML IJ SOLN
INTRAMUSCULAR | Status: DC | PRN
  Administered 2023-08-25: 4 mg via INTRAVENOUS

## 2023-08-25 MED ORDER — EPHEDRINE SULFATE (PRESSORS) 50 MG/ML IJ SOLN
INTRAMUSCULAR | Status: DC | PRN
  Administered 2023-08-25: 10 mg via INTRAVENOUS
  Administered 2023-08-25 (×4): 5 mg via INTRAVENOUS

## 2023-08-25 MED ORDER — BUPIVACAINE LIPOSOME 1.3 % IJ SUSP
INTRAMUSCULAR | Status: DC | PRN
  Administered 2023-08-25: 10 mL via PERINEURAL

## 2023-08-25 MED ORDER — PROPOFOL 10 MG/ML IV BOLUS
INTRAVENOUS | Status: DC | PRN
Start: 2023-08-25 — End: 2023-08-25
  Administered 2023-08-25: 180 mg via INTRAVENOUS

## 2023-08-25 MED ORDER — ROCURONIUM BROMIDE 100 MG/10ML IV SOLN
INTRAVENOUS | Status: DC | PRN
  Administered 2023-08-25: 50 mg via INTRAVENOUS

## 2023-08-25 MED ORDER — ONDANSETRON HCL 4 MG/2ML IJ SOLN
INTRAMUSCULAR | Status: AC
  Filled 2023-08-25: qty 2

## 2023-08-25 SURGICAL SUPPLY — 46 items
ANCHOR SUT 1.8 FIBERTAK SB KL (Anchor) IMPLANT
BLADE EXCALIBUR 4.0X13 (MISCELLANEOUS) ×1 IMPLANT
BURR OVAL 8 FLU 4.0X13 (MISCELLANEOUS) ×1 IMPLANT
CANNULA 5.75X71 LONG (CANNULA) IMPLANT
CANNULA PASSPORT 5 (CANNULA) IMPLANT
CANNULA PASSPORT BUTTON 10-40 (CANNULA) IMPLANT
CANNULA TWIST IN 8.25X7CM (CANNULA) IMPLANT
CHLORAPREP W/TINT 26 (MISCELLANEOUS) ×1 IMPLANT
CLSR STERI-STRIP ANTIMIC 1/2X4 (GAUZE/BANDAGES/DRESSINGS) ×1 IMPLANT
COOLER ICEMAN CLASSIC (MISCELLANEOUS) ×1 IMPLANT
DRAPE IMP U-DRAPE 54X76 (DRAPES) ×1 IMPLANT
DRAPE INCISE IOBAN 66X45 STRL (DRAPES) IMPLANT
DRAPE SHOULDER BEACH CHAIR (DRAPES) ×1 IMPLANT
DW OUTFLOW CASSETTE/TUBE SET (MISCELLANEOUS) ×1 IMPLANT
GAUZE PAD ABD 8X10 STRL (GAUZE/BANDAGES/DRESSINGS) ×1 IMPLANT
GAUZE SPONGE 4X4 12PLY STRL (GAUZE/BANDAGES/DRESSINGS) ×1 IMPLANT
GLOVE BIO SURGEON STRL SZ 6.5 (GLOVE) ×1 IMPLANT
GLOVE BIOGEL PI IND STRL 6.5 (GLOVE) ×1 IMPLANT
GLOVE BIOGEL PI IND STRL 8 (GLOVE) ×1 IMPLANT
GLOVE ECLIPSE 8.0 STRL XLNG CF (GLOVE) ×1 IMPLANT
GOWN STRL REUS W/ TWL LRG LVL3 (GOWN DISPOSABLE) ×2 IMPLANT
GOWN STRL REUS W/TWL XL LVL3 (GOWN DISPOSABLE) ×1 IMPLANT
KIT STABILIZATION SHOULDER (MISCELLANEOUS) ×1 IMPLANT
KIT STR SPEAR 1.8 FBRTK DISP (KITS) IMPLANT
LASSO CRESCENT QUICKPASS (SUTURE) IMPLANT
MANIFOLD NEPTUNE II (INSTRUMENTS) ×1 IMPLANT
NDL HD SCORPION MEGA LOADER (NEEDLE) IMPLANT
NDL SAFETY ECLIPSE 18X1.5 (NEEDLE) ×1 IMPLANT
PACK ARTHROSCOPY DSU (CUSTOM PROCEDURE TRAY) ×1 IMPLANT
PACK BASIN DAY SURGERY FS (CUSTOM PROCEDURE TRAY) ×1 IMPLANT
PAD COLD SHLDR WRAP-ON (PAD) ×1 IMPLANT
RESTRAINT HEAD UNIVERSAL NS (MISCELLANEOUS) ×1 IMPLANT
SHEET MEDIUM DRAPE 40X70 STRL (DRAPES) ×1 IMPLANT
SLEEVE SCD COMPRESS KNEE MED (STOCKING) ×1 IMPLANT
SLING ARM FOAM STRAP LRG (SOFTGOODS) IMPLANT
SUT MNCRL AB 4-0 PS2 18 (SUTURE) ×1 IMPLANT
SUT PDS AB 0 CT 36 (SUTURE) IMPLANT
SUT TIGER TAPE 7 IN WHITE (SUTURE) IMPLANT
SUTURE FIBERWR #2 38 T-5 BLUE (SUTURE) IMPLANT
SUTURE TAPE TIGERLINK 1.3MM BL (SUTURE) IMPLANT
SYR 5ML LL (SYRINGE) ×1 IMPLANT
TAPE FIBER 2MM 7IN #2 BLUE (SUTURE) IMPLANT
TOWEL GREEN STERILE FF (TOWEL DISPOSABLE) ×2 IMPLANT
TUBE CONNECTING 20X1/4 (TUBING) ×1 IMPLANT
TUBING ARTHROSCOPY IRRIG 16FT (MISCELLANEOUS) ×1 IMPLANT
WAND ABLATOR APOLLO I90 (BUR) ×1 IMPLANT

## 2023-08-25 NOTE — Interval H&P Note (Signed)
 All questions answered, patient wants to proceed with procedure. ? ?

## 2023-08-25 NOTE — Progress Notes (Signed)
Assisted Dr. Foster with left, interscalene , ultrasound guided block. Side rails up, monitors on throughout procedure. See vital signs in flow sheet. Tolerated Procedure well. 

## 2023-08-25 NOTE — Transfer of Care (Signed)
 Immediate Anesthesia Transfer of Care Note  Patient: April Webster  Procedure(s) Performed: ARTHROSCOPY, SHOULDER WITH DEBRIDEMENT, BIOTENDONESIS REPAIR (Left: Shoulder)  Patient Location: PACU  Anesthesia Type:GA combined with regional for post-op pain  Level of Consciousness: drowsy  Airway & Oxygen Therapy: Patient Spontanous Breathing and Patient connected to face mask oxygen  Post-op Assessment: Report given to RN and Post -op Vital signs reviewed and stable  Post vital signs: Reviewed and stable  Last Vitals:  Vitals Value Taken Time  BP 139/77 (96)   Temp    Pulse 96   Resp 16   SpO2 97     Last Pain:  Vitals:   08/25/23 0638  TempSrc: Temporal  PainSc: 6       Patients Stated Pain Goal: 5 (08/25/23 4098)  Complications: No notable events documented.

## 2023-08-25 NOTE — Anesthesia Postprocedure Evaluation (Signed)
 Anesthesia Post Note  Patient: April Webster  Procedure(s) Performed: ARTHROSCOPY, SHOULDER WITH DEBRIDEMENT, BIOTENDONESIS REPAIR (Left: Shoulder)     Patient location during evaluation: PACU Anesthesia Type: General Level of consciousness: awake and alert and oriented Pain management: pain level controlled Vital Signs Assessment: post-procedure vital signs reviewed and stable Respiratory status: spontaneous breathing, nonlabored ventilation and respiratory function stable Cardiovascular status: blood pressure returned to baseline and stable Postop Assessment: no apparent nausea or vomiting Anesthetic complications: no   No notable events documented.  Last Vitals:  Vitals:   08/25/23 1000 08/25/23 1015  BP: 139/77 113/70  Pulse: 98 99  Resp: 16 (!) 21  Temp: 36.6 C   SpO2: 97% 94%    Last Pain:  Vitals:   08/25/23 1015  TempSrc:   PainSc: 0-No pain                 Remy Dia A.

## 2023-08-25 NOTE — Anesthesia Procedure Notes (Signed)
 Procedure Name: Intubation Date/Time: 08/25/2023 8:55 AM  Performed by: Noralyn Beams, CRNAPre-anesthesia Checklist: Patient identified, Emergency Drugs available, Suction available and Patient being monitored Patient Re-evaluated:Patient Re-evaluated prior to induction Oxygen Delivery Method: Circle system utilized Preoxygenation: Pre-oxygenation with 100% oxygen Induction Type: IV induction Ventilation: Mask ventilation without difficulty Laryngoscope Size: Mac and 3 Grade View: Grade I Tube type: Oral Tube size: 7.0 mm Number of attempts: 1 Airway Equipment and Method: Stylet and Bite block Placement Confirmation: ETT inserted through vocal cords under direct vision, positive ETCO2 and breath sounds checked- equal and bilateral Secured at: 20 cm Tube secured with: Tape Dental Injury: Teeth and Oropharynx as per pre-operative assessment

## 2023-08-25 NOTE — Anesthesia Procedure Notes (Signed)
 Anesthesia Regional Block: Interscalene brachial plexus block   Pre-Anesthetic Checklist: , timeout performed,  Correct Patient, Correct Site, Correct Laterality,  Correct Procedure, Correct Position, site marked,  Risks and benefits discussed,  Surgical consent,  Pre-op evaluation,  At surgeon's request and post-op pain management  Laterality: Left  Prep: chloraprep       Needles:  Injection technique: Single-shot  Needle Type: Echogenic Stimulator Needle     Needle Length: 10cm  Needle Gauge: 21   Needle insertion depth: 5 cm   Additional Needles:   Procedures:,,,, ultrasound used (permanent image in chart),,   Motor weakness within 5 minutes.  Narrative:  Start time: 08/25/2023 8:23 AM End time: 08/25/2023 8:28 AM Injection made incrementally with aspirations every 5 mL.  Performed by: Personally  Anesthesiologist: Tura Gaines, MD  Additional Notes: Timeout performed. Patient sedated. Relevant anatomy ID'd using US . Incremental 2-5ml injection of LA with frequent aspiration. Patient tolerated procedure well.

## 2023-08-25 NOTE — Op Note (Signed)
 Orthopaedic Surgery Operative Note (CSN: [id number])  April Webster  06/12/1975 Date of Surgery: 08/25/2023   DIAGNOSES: Left shoulder, SLAP tear, biceps tendinitis, AC arthritis, and subacromial impingement.  POST-OPERATIVE DIAGNOSIS: same  PROCEDURE: Arthroscopic extensive debridement - 29823 Subdeltoid Bursa, Supraspinatus Tendon, Anterior Labrum, Superior Labrum, Posterior Labrum, and glenoid bone, glenoid cartilage, humeral bone and humeral cartilage Arthroscopic distal clavicle excision - 78295 Arthroscopic subacromial decompression - 62130 Arthroscopic biceps tenodesis - 86578   OPERATIVE FINDING: Exam under anesthesia: Normal Articular space: There is significant capsulitis of the rotator interval and thickening of the inferior capsule.  We released some of the rotator interval but we were careful not to over at least the capsule.-Under anesthesia was normal after this.  She was perhaps slightly tight with external rotation preoperatively. Chondral surfaces: Normal Biceps: SLAP tear with intrasubstance redness and tearing of the biceps. Subscapularis: Intact  Supraspinatus: Incomplete tear there is a bursal sided where marked in about 5 to 10% partial thickness tear of the supraspinatus Infraspinatus: Intact    The patient has significant subacromial bursitis and thickening of the subacromial tissues which likely cause pain and stiffness.  Will get aggressive with therapy.  Post-operative plan: The patient will be non-weightbearing in a sling 4 weeks.  The patient will be discharged home.  DVT prophylaxis not indicated in ambulatory upper extremity patient without known risk factors.   Pain control with PRN pain medication preferring oral medicines.  Follow up plan will be scheduled in approximately 7 days for incision check and XR.  Surgeons:Primary: Micheline Ahr, MD Assistants:Caroline McBane, PA-C Location: [facility] OR ROOM 6 Anesthesia: General with Exparel interscalene  block Antibiotics: 2g ancef Tourniquet time: None Estimated Blood Loss: Minimal Complications: None Specimens: None Implants: Implant Name Type Inv. Item Serial No. Manufacturer Lot No. LRB No. Used Action  ANCHOR SUT 1.8 FIBERTAK SB KL - [id number] Anchor ANCHOR SUT 1.8 FIBERTAK SB KL  [employer] [id number] Left 1 Implanted  ANCHOR SUT 1.8 FIBERTAK SB KL - [id number] Anchor ANCHOR SUT 1.8 [employer] [id number] Left 1 Implanted    Indications for Surgery:   April Webster is a 48 y.o. female with continued shoulder pain refractory to nonoperative measures for extended period of time.    The risks and benefits were explained at length including but not limited to continued pain, cuff failure, biceps tenodesis failure, stiffness, need for further surgery and infection.   Procedure:   Patient was correctly identified in the preoperative holding area and operative site marked.  Patient brought to OR and positioned beachchair on an Waggoner table ensuring that all bony prominences were padded and the head was in an appropriate location.  Anesthesia was induced and the operative shoulder was prepped and draped in the usual sterile fashion.  Timeout was called preincision.  A standard posterior viewing portal was made after localizing the portal with a spinal needle.  An anterior accessory portal was also made.  After clearing the articular space the camera was positioned in the subacromial space.  Findings above.    Extensive debridement was performed of the anterior interval tissue, labral fraying and the bursa.  Glenoid bone, glenoid cartilage, humeral bone were all debrided.  Subacromial decompression: We made a lateral portal with spinal needle guidance. We then proceeded to debride bursal tissue extensively with a shaver and arthrocare device. At that point we continued to identify the borders of the acromion and identify the spur. We then carefully preserved the  deltoid fascia and  used a burr to convert the acromion to a Type 1 flat acromion without issue.  Biceps tenodesis: We marked the tendon and then performed a tenotomy and debridement of the stump in the articular space. We then identified the biceps tendon in its groove suprapec with the arthroscope in the lateral portal taking care to move from lateral to medial to avoid injury to the subscapularis. At that point we unroofed the tendon itself and mobilized it. An accessory anterior portal was made in line with the tendon and we grasped it from the anterior superior portal and worked from the accessory anterior portal. Two Fibertak 1.15mm knotless anchors were placed in the groove and the tendon was secured in a luggage loop style fashion with a pass of the limb of suture through the tendon using a scorpion device to avoid pull-through.  Repair was completed with good tension on the tendon.  Residual stump of the tendon was removed after being resected with a RF ablator.  Distal Clavicle resection:  The scope was placed in the subacromial space from the posterior portal.  A hemostat was placed through the anterior portal and we spread at the [facility] joint.  A burr was then inserted and 10 mm of distal clavicle was resected taking care to avoid damage to the capsule around the joint and avoiding overhanging bone posteriorly.     The incisions were closed with absorbable monocryl and steri strips.  A sterile dressing was placed along with a sling. The patient was awoken from general anesthesia and taken to the PACU in stable condition without complication.   Nicholas Bari, PA-C, present and scrubbed throughout the case, critical for completion in a timely fashion, and for retraction, instrumentation, closure.

## 2023-08-26 ENCOUNTER — Encounter (HOSPITAL_BASED_OUTPATIENT_CLINIC_OR_DEPARTMENT_OTHER): Payer: Self-pay | Admitting: Orthopaedic Surgery

## 2023-08-31 ENCOUNTER — Encounter: Payer: Self-pay | Admitting: Physical Therapy

## 2023-08-31 ENCOUNTER — Other Ambulatory Visit: Payer: Self-pay

## 2023-08-31 ENCOUNTER — Ambulatory Visit: Attending: Orthopaedic Surgery | Admitting: Physical Therapy

## 2023-08-31 DIAGNOSIS — M25512 Pain in left shoulder: Secondary | ICD-10-CM | POA: Insufficient documentation

## 2023-08-31 DIAGNOSIS — M6281 Muscle weakness (generalized): Secondary | ICD-10-CM | POA: Diagnosis present

## 2023-08-31 DIAGNOSIS — R293 Abnormal posture: Secondary | ICD-10-CM | POA: Insufficient documentation

## 2023-08-31 NOTE — Therapy (Signed)
 OUTPATIENT PHYSICAL THERAPY SHOULDER EVALUATION   Patient Name: April Webster MRN: [id number] DOB:09-29-1975, 48 y.o., female Today's Date: 08/31/2023  END OF SESSION:  PT End of Session - 08/31/23 1435     Visit Number 1    Number of Visits 16    Date for PT Re-Evaluation 10/26/23    Authorization Type Aetna    PT Start Time 1315    PT Stop Time 1351    PT Time Calculation (min) 36 min    Activity Tolerance Patient tolerated treatment well    Behavior During Therapy WFL for tasks assessed/performed          Past Medical History:  Diagnosis Date   Chronic idiopathic constipation    DDD (degenerative disc disease), cervical and lumbar    Female pattern hair loss    Graves disease    Hyperlipidemia    Hypertension    Hypothyroidism    Lichen sclerosus    Raynaud's disease    Past Surgical History:  Procedure Laterality Date   ABDOMINOPLASTY     AUGMENTATION MAMMAPLASTY Bilateral 05/2019   Silicone    CHOLECYSTECTOMY     DILITATION & CURRETTAGE/HYSTROSCOPY WITH NOVASURE ABLATION N/A 02/09/2022   Procedure: DILATATION & CURETTAGE/HYSTEROSCOPY WITH NOVASURE ABLATION, REMOVAL OF IUD;  Surgeon: Lacey Pian, MD;  Location: [facility];  Service: Gynecology;  Laterality: N/A;   PLACEMENT OF BREAST IMPLANTS     POSTERIOR LUMBAR FUSION 2 WITH HARDWARE REMOVAL Left 08/25/2023   Procedure: ARTHROSCOPY, SHOULDER WITH DEBRIDEMENT, BIOTENDONESIS REPAIR;  Surgeon: Micheline Ahr, MD;  Location: [facility];  Service: Orthopedics;  Laterality: Left;   THYROID  SURGERY     TUBAL LIGATION     Patient Active Problem List   Diagnosis Date Noted   Chronic neck pain 04/13/2023   Recurrent infections 03/02/2023   Chronic left shoulder pain 12/21/2022   Attention deficit hyperactivity disorder (ADHD), combined type 07/20/2022   Primary insomnia 09/09/2021   Chronic pain of right knee 08/12/2021   LGSIL on Pap smear of cervix 07/01/2020   Raynaud's disease 02/18/2020   IC  (interstitial cystitis) 02/18/2020   Chronic constipation 02/18/2020   DDD (degenerative disc disease), cervical 12/14/2019   Gastroesophageal reflux disease 05/16/2019   Hypothyroidism 03/19/2019   Hyperlipidemia 03/19/2019   Facet syndrome, lumbar 03/19/2019   Female pattern hair loss 03/19/2019   Lichen sclerosus 03/19/2019   Upper airway cough syndrome 03/19/2019   IUD (intrauterine device) in place 12/06/2016   MDD (major depressive disorder), recurrent episode (HCC) 05/10/2011    PCP: Greer Leak  REFERRING PROVIDER: Yvonne Hering  REFERRING DIAG: Lt Biceps Tenodesis with clavicle resection and acromial decompression  THERAPY DIAG:  Acute pain of left shoulder  Muscle weakness (generalized)  Rationale for Evaluation and Treatment: Rehabilitation  ONSET DATE: 08/25/23  SUBJECTIVE:  SUBJECTIVE STATEMENT: Pt is s/p Lt biceps tenodesis with clavicle resection and acromial decompression. She has been using tylenol  and ice for pain control. She has been sleeping in the recliner and has been sleeping well. Hand dominance: Right  PERTINENT HISTORY: DDD, graves disease  PAIN:  Are you having pain? Yes: NPRS scale: 2/10 currently, 8/10 at worst Pain location: Lt shoulder Pain description: spasms Aggravating factors: end of day Relieving factors: ice, meds  PRECAUTIONS: Shoulder - see protocol  RED FLAGS: None   WEIGHT BEARING RESTRICTIONS: NWB Lt UE  FALLS:  Has patient fallen in last 6 months? 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    PATIENT SURVEYS:  Quick Dash  56.8  COGNITION: Overall cognitive status: Within functional limits for tasks assessed     SENSATION: Numbness/tingling in Lt hand with full elbow flexion  POSTURE: Pt wearing sling on Lt UE  UPPER EXTREMITY ROM:   Passive ROM Right eval Left eval  Shoulder flexion  84  Shoulder extension    Shoulder abduction  Not assessed due to protocol  Shoulder adduction    Shoulder internal rotation  45  Shoulder external rotation  21  Elbow flexion    Elbow extension    Wrist flexion    Wrist extension    Wrist ulnar deviation    Wrist radial deviation    Wrist pronation    Wrist supination    (Blank rows = not tested)  UPPER EXTREMITY MMT: Not assessed due to surgery MMT Right eval Left eval  Shoulder flexion    Shoulder extension    Shoulder abduction    Shoulder adduction    Shoulder internal rotation    Shoulder external rotation    Middle trapezius    Lower trapezius    Elbow flexion    Elbow extension    Wrist flexion    Wrist extension    Wrist ulnar deviation    Wrist radial deviation    Wrist pronation    Wrist supination    Grip strength (lbs)    (Blank rows = not tested)   PALPATION:  Stitches intact Lt shoulder                                                                                                                             TREATMENT DATE: 08/31/23 See HEP Pt educated on PT POC and goals, protocol, anticipated progression   PATIENT EDUCATION: Education details: PT POC and goals, HEP, protocol, precautions Person educated: Patient Education method: Explanation, Demonstration, and Handouts Education comprehension: verbalized understanding and returned demonstration  HOME EXERCISE PROGRAM: Access Code: [email or web address] URL: [email or web address] Date: 08/31/2023 Prepared by: Lowery Rue  Exercises - Seated Scapular Retraction  - 1 x daily - 7 x weekly -  3 sets - 10 reps - Standing Elbow Flexion Extension AROM  - 1 x  daily - 7 x weekly - 3 sets - 10 reps - Seated Forearm Pronation and Supination AROM  - 1 x daily - 7 x weekly - 3 sets - 10 reps - Supine Shoulder Flexion Extension AAROM with Dowel  - 1 x daily - 7 x weekly - 3 sets - 10 reps - Standing Shoulder External Rotation AAROM with Dowel  - 1 x daily - 7 x weekly - 3 sets - 10 reps  ASSESSMENT:  CLINICAL IMPRESSION: Patient is a 48 y.o. female who was seen today for physical therapy evaluation and treatment for Lt bicep tenodesis with distal clavicle resection and acromial decompression. She presents with decreased strength, ROM and functional use and increased pain. She will benefit from skilled PT to address deficits and improve functional mobility  and independence.   OBJECTIVE IMPAIRMENTS: decreased activity tolerance, decreased ROM, decreased strength, impaired UE functional use, and pain.   ACTIVITY LIMITATIONS: carrying, lifting, dressing, and reach over head  PARTICIPATION LIMITATIONS: meal prep, cleaning, laundry, driving, shopping, community activity, and occupation  PERSONAL FACTORS: Time since onset of injury/illness/exacerbation and 1 comorbidity: DDD are also affecting patient's functional outcome.   REHAB POTENTIAL: Good  CLINICAL DECISION MAKING: Stable/uncomplicated  EVALUATION COMPLEXITY: Low   GOALS: Goals reviewed with patient? Yes  SHORT TERM GOALS: Target date: 09/28/2023    Pt will be independent with initial HEP Baseline: Goal status: INITIAL  2.  Pt will demo 140 degrees of shoulder flexion PROM Baseline:  Goal status: INITIAL    LONG TERM GOALS: Target date: 10/26/2023    Pt will be independent with advanced HEP Baseline:  Goal status: INITIAL  2.  Pt will improve QuickDash to <= 46 to demo improved functional mobility Baseline:  Goal status: INITIAL  3.  Pt will demo 4+/5 Lt shoulder strength to improve functional mobility Baseline:  Goal status: INITIAL  4.  Pt will return to exercise with  pain <= 2/10 Baseline:  Goal status: INITIAL  5.  Pt will improve internal rotation to be able to fasten bra without difficulty Baseline:  Goal status: INITIAL    PLAN:  PT FREQUENCY: 1-2x/week  PT DURATION: 8 weeks  PLANNED INTERVENTIONS: 97164- PT Re-evaluation, 97110-Therapeutic exercises, 97530- Therapeutic activity, [id number]- Neuromuscular re-education, 97535- Self Care, 16109- Manual therapy, [id number]- Aquatic Therapy, U0454- Electrical stimulation (unattended), 97016- Vasopneumatic device, [id number]- Ionotophoresis 4mg /ml Dexamethasone , Patient/Family education, Taping, Cryotherapy, and Moist heat  PLAN FOR NEXT SESSION: progress per protocol   Izeah Vossler, PT 08/31/2023, 2:37 PM

## 2023-09-02 DIAGNOSIS — M19012 Primary osteoarthritis, left shoulder: Secondary | ICD-10-CM | POA: Diagnosis not present

## 2023-09-05 ENCOUNTER — Ambulatory Visit

## 2023-09-05 ENCOUNTER — Telehealth: Payer: Self-pay | Admitting: Sports Medicine

## 2023-09-05 DIAGNOSIS — M25412 Effusion, left shoulder: Secondary | ICD-10-CM | POA: Diagnosis not present

## 2023-09-05 DIAGNOSIS — M25512 Pain in left shoulder: Secondary | ICD-10-CM | POA: Diagnosis not present

## 2023-09-05 DIAGNOSIS — G8929 Other chronic pain: Secondary | ICD-10-CM

## 2023-09-05 DIAGNOSIS — M7582 Other shoulder lesions, left shoulder: Secondary | ICD-10-CM | POA: Diagnosis not present

## 2023-09-05 DIAGNOSIS — R609 Edema, unspecified: Secondary | ICD-10-CM | POA: Diagnosis not present

## 2023-09-05 NOTE — Telephone Encounter (Signed)
 April Webster is recently post shoulder arthroscopy with Dr. Cristy, left subacromial decompression, distal clavicular excision, labral repair/debridement, biceps tenodesis.  She moved awkwardly and felt a pop followed by severe pain and weakness with supination, I would like an MRI today to ensure that the biceps tenodesis has not failed.

## 2023-09-07 ENCOUNTER — Ambulatory Visit: Admitting: Rehabilitative and Restorative Service Providers"

## 2023-09-07 ENCOUNTER — Ambulatory Visit: Admitting: Professional

## 2023-09-07 ENCOUNTER — Encounter: Payer: Self-pay | Admitting: Rehabilitative and Restorative Service Providers"

## 2023-09-07 DIAGNOSIS — R293 Abnormal posture: Secondary | ICD-10-CM

## 2023-09-07 DIAGNOSIS — M25512 Pain in left shoulder: Secondary | ICD-10-CM | POA: Diagnosis not present

## 2023-09-07 DIAGNOSIS — M6281 Muscle weakness (generalized): Secondary | ICD-10-CM

## 2023-09-07 NOTE — Therapy (Signed)
 OUTPATIENT PHYSICAL THERAPY SHOULDER TREATMENT   Patient Name: April Webster MRN: [id number] DOB:1975-12-16, 48 y.o., female Today's Date: 09/07/2023  END OF SESSION:  PT End of Session - 09/07/23 1318     Visit Number 2    Number of Visits 16    Date for PT Re-Evaluation 10/26/23    Authorization Type Aetna    PT Start Time 1315    PT Stop Time 1400    PT Time Calculation (min) 45 min    Activity Tolerance Patient tolerated treatment well          Past Medical History:  Diagnosis Date   Chronic idiopathic constipation    DDD (degenerative disc disease), cervical and lumbar    Female pattern hair loss    Graves disease    Hyperlipidemia    Hypertension    Hypothyroidism    Lichen sclerosus    Raynaud's disease    Past Surgical History:  Procedure Laterality Date   ABDOMINOPLASTY     AUGMENTATION MAMMAPLASTY Bilateral 05/2019   Silicone    CHOLECYSTECTOMY     DILITATION & CURRETTAGE/HYSTROSCOPY WITH NOVASURE ABLATION N/A 02/09/2022   Procedure: DILATATION & CURETTAGE/HYSTEROSCOPY WITH NOVASURE ABLATION, REMOVAL OF IUD;  Surgeon: Cleatus Moccasin, MD;  Location: [facility];  Service: Gynecology;  Laterality: N/A;   PLACEMENT OF BREAST IMPLANTS     POSTERIOR LUMBAR FUSION 2 WITH HARDWARE REMOVAL Left 08/25/2023   Procedure: ARTHROSCOPY, SHOULDER WITH DEBRIDEMENT, BIOTENDONESIS REPAIR;  Surgeon: Cristy Bonner DASEN, MD;  Location: [facility];  Service: Orthopedics;  Laterality: Left;   THYROID  SURGERY     TUBAL LIGATION     Patient Active Problem List   Diagnosis Date Noted   Chronic neck pain 04/13/2023   Recurrent infections 03/02/2023   Chronic left shoulder pain 12/21/2022   Attention deficit hyperactivity disorder (ADHD), combined type 07/20/2022   Primary insomnia 09/09/2021   Chronic pain of right knee 08/12/2021   LGSIL on Pap smear of cervix 07/01/2020   Raynaud's disease 02/18/2020   IC (interstitial cystitis) 02/18/2020   Chronic constipation  02/18/2020   DDD (degenerative disc disease), cervical 12/14/2019   Gastroesophageal reflux disease 05/16/2019   Hypothyroidism 03/19/2019   Hyperlipidemia 03/19/2019   Facet syndrome, lumbar 03/19/2019   Female pattern hair loss 03/19/2019   Lichen sclerosus 03/19/2019   Upper airway cough syndrome 03/19/2019   IUD (intrauterine device) in place 12/06/2016   MDD (major depressive disorder), recurrent episode (HCC) 05/10/2011    PCP: Alvan  REFERRING PROVIDER: Cristy  REFERRING DIAG: Lt Biceps Tenodesis with clavicle resection and acromial decompression  THERAPY DIAG:  Acute pain of left shoulder  Muscle weakness (generalized)  Left shoulder pain, unspecified chronicity  Abnormal posture  Rationale for Evaluation and Treatment: Rehabilitation  ONSET DATE: 08/25/23  SUBJECTIVE:  SUBJECTIVE STATEMENT: Doing well until Saturday when she was reading on her phone and moved her thumb experiencing a sudden sharp pain in the posterior shoulder and heard an audible pop in the shoulder. Pain subsided with ice and rest. She notices some edema in the area of the biceps but no pain in the biceps or shoulder since Friday. Dr T looked at the shoulder and contacted Dr Cristy. Talah will keep her scheduled appt with Dr Cristy in ~ 2 weeks and let him know if she experiences increased pain prior to that appt. (Dr Cristy does not want her to use dowel for shoulder flexion at this point)   Eval: Pt is s/p Lt biceps tenodesis with clavicle resection and acromial decompression. She has been using tylenol  and ice for pain control. She has been sleeping in the recliner and has been sleeping well. Hand dominance: Right  PERTINENT HISTORY: DDD, graves disease  PAIN:  Are you having pain? Yes: NPRS scale: 2/10 currently,  8/10 at worst Pain location: Lt shoulder Pain description: spasms Aggravating factors: end of day Relieving factors: ice, meds  PRECAUTIONS: Shoulder - see protocol   WEIGHT BEARING RESTRICTIONS: NWB Lt UE  FALLS:  Has patient fallen in last 6 months? 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    PATIENT SURVEYS:  Quick Dash 56.8     SENSATION: Numbness/tingling in Lt hand with full elbow flexion  POSTURE: Pt wearing sling on Lt UE  UPPER EXTREMITY ROM:   Passive ROM Right eval Left eval  Shoulder flexion  84  Shoulder extension    Shoulder abduction  Not assessed due to protocol  Shoulder adduction    Shoulder internal rotation  45  Shoulder external rotation  21  Elbow flexion    Elbow extension    Wrist flexion    Wrist extension    Wrist ulnar deviation    Wrist radial deviation    Wrist pronation    Wrist supination    (Blank rows = not tested)  UPPER EXTREMITY MMT: Not assessed due to surgery MMT Right eval Left eval  Shoulder flexion    Shoulder extension    Shoulder abduction    Shoulder adduction    Shoulder internal rotation    Shoulder external rotation    Middle trapezius    Lower trapezius    Elbow flexion    Elbow extension    Wrist flexion    Wrist extension    Wrist ulnar deviation    Wrist radial deviation    Wrist pronation    Wrist supination    Grip strength (lbs)    (Blank rows = not tested)   PALPATION:  Stitches intact Lt shoulder   [facility] Adult PT Treatment:                                                DATE: 09/07/23 Therapeutic Exercise: Wrist flex/ext Forearm supination/pronation Elbow flexion/extension Grip with sponge  Scap squeeze  Manual Therapy: STM through the L shoulder girdle  PROM L shoulder pt supine shoulder  flexion 125; scaption ~ 90; ER ~ 55-60 in scapular plane  Modalities: Vaso x 10 min  34 deg; low pressure                                                                                                                              TREATMENT DATE: 08/31/23 See HEP Pt educated on PT POC and goals, protocol, anticipated progression   PATIENT EDUCATION: Education details: PT POC and goals, HEP, protocol, precautions Person educated: Patient Education method: Explanation, Demonstration, and Handouts Education comprehension: verbalized understanding and returned demonstration  HOME EXERCISE PROGRAM: Access Code: [email or web address] URL: [email or web address] Date: 08/31/2023 Prepared by: Darice Conine  Exercises - Seated Scapular Retraction  - 1 x daily - 7 x weekly - 3 sets - 10 reps - Standing Elbow Flexion Extension AROM  - 1 x daily - 7 x weekly - 3 sets - 10 reps - Seated Forearm Pronation and Supination AROM  - 1 x daily - 7 x weekly - 3 sets - 10 reps - Supine Shoulder Flexion Extension AAROM with Dowel  - 1 x daily - 7 x weekly - 3 sets - 10 reps - Standing Shoulder External Rotation AAROM with Dowel  - 1 x daily - 7 x weekly - 3 sets - 10 reps  ASSESSMENT:  CLINICAL IMPRESSION: Shelsy reports experiencing a sharp pain in the back of the L shoulder and a pop in the posterior shoulder on Saturday. Pain subsided. Dr T did MRI Monday and communicated with Dr Cristy. Cruz will monitor for any further changes and call if needed. Continued with AROM elbow, forearm, wrist, hand. Patient supine for SMT and PROM L shoulder. Tolerated treatment well.   Eval: Patient is a 48 y.o. female who was seen today for physical therapy evaluation and treatment for Lt bicep tenodesis with distal clavicle resection and acromial decompression. She presents with decreased strength, ROM and functional use and increased pain. She will benefit from skilled PT to address deficits and improve functional  mobility  and independence.   OBJECTIVE IMPAIRMENTS: decreased activity tolerance, decreased ROM, decreased strength, impaired UE functional use, and pain.    GOALS: Goals reviewed with patient? Yes  SHORT TERM GOALS: Target date: 09/28/2023    Pt will be independent with initial HEP Baseline: Goal status: INITIAL  2.  Pt will demo 140 degrees of shoulder flexion PROM Baseline:  Goal status: INITIAL    LONG TERM GOALS: Target date: 10/26/2023    Pt will be independent with advanced HEP Baseline:  Goal status: INITIAL  2.  Pt will improve QuickDash to <= 46 to demo improved functional mobility Baseline:  Goal status: INITIAL  3.  Pt will demo 4+/5 Lt shoulder strength to improve functional mobility Baseline:  Goal status: INITIAL  4.  Pt will return to exercise with pain <= 2/10 Baseline:  Goal status: INITIAL  5.  Pt will improve internal rotation to be able to fasten bra without difficulty Baseline:  Goal status: INITIAL    PLAN:  PT FREQUENCY: 1-2x/week  PT  DURATION: 8 weeks  PLANNED INTERVENTIONS: 97164- PT Re-evaluation, 97110-Therapeutic exercises, 97530- Therapeutic activity, [id number]- Neuromuscular re-education, 97535- Self Care, 02859- Manual therapy, [phone]- Aquatic Therapy, [phone]- Electrical stimulation (unattended), 97016- Vasopneumatic device, 97033- Ionotophoresis 4mg /ml Dexamethasone , Patient/Family education, Taping, Cryotherapy, and Moist heat  PLAN FOR NEXT SESSION: progress per protocol   Glenette Bookwalter SHAUNNA Baptist, PT 09/07/2023, 3:02 PM

## 2023-09-12 ENCOUNTER — Other Ambulatory Visit (HOSPITAL_COMMUNITY): Payer: Self-pay

## 2023-09-12 ENCOUNTER — Other Ambulatory Visit: Payer: Self-pay

## 2023-09-14 ENCOUNTER — Encounter: Payer: Self-pay | Admitting: Physical Therapy

## 2023-09-14 ENCOUNTER — Encounter (HOSPITAL_BASED_OUTPATIENT_CLINIC_OR_DEPARTMENT_OTHER): Admitting: Internal Medicine

## 2023-09-14 ENCOUNTER — Ambulatory Visit: Attending: Orthopaedic Surgery | Admitting: Physical Therapy

## 2023-09-14 DIAGNOSIS — M6281 Muscle weakness (generalized): Secondary | ICD-10-CM | POA: Diagnosis not present

## 2023-09-14 DIAGNOSIS — M25512 Pain in left shoulder: Secondary | ICD-10-CM | POA: Insufficient documentation

## 2023-09-14 NOTE — Therapy (Signed)
 OUTPATIENT PHYSICAL THERAPY SHOULDER TREATMENT   Patient Name: April Webster MRN: [id number] DOB:Mar 22, 1975, 48 y.o., female Today's Date: 09/14/2023  END OF SESSION:  PT End of Session - 09/14/23 1406     Visit Number 3    Number of Visits 16    Date for PT Re-Evaluation 10/26/23    Authorization Type [employer]    PT Start Time 1315    PT Stop Time 1355    PT Time Calculation (min) 40 min    Activity Tolerance Patient tolerated treatment well    Behavior During Therapy WFL for tasks assessed/performed           Past Medical History:  Diagnosis Date   Chronic idiopathic constipation    DDD (degenerative disc disease), cervical and lumbar    Female pattern hair loss    Graves disease    Hyperlipidemia    Hypertension    Hypothyroidism    Lichen sclerosus    Raynaud's disease    Past Surgical History:  Procedure Laterality Date   ABDOMINOPLASTY     AUGMENTATION MAMMAPLASTY Bilateral 05/2019   Silicone    CHOLECYSTECTOMY     DILITATION & CURRETTAGE/HYSTROSCOPY WITH NOVASURE ABLATION N/A 02/09/2022   Procedure: DILATATION & CURETTAGE/HYSTEROSCOPY WITH NOVASURE ABLATION, REMOVAL OF IUD;  Surgeon: Cleatus Moccasin, MD;  Location: [facility];  Service: Gynecology;  Laterality: N/A;   PLACEMENT OF BREAST IMPLANTS     POSTERIOR LUMBAR FUSION 2 WITH HARDWARE REMOVAL Left 08/25/2023   Procedure: ARTHROSCOPY, SHOULDER WITH DEBRIDEMENT, BIOTENDONESIS REPAIR;  Surgeon: Cristy Bonner DASEN, MD;  Location: [facility];  Service: Orthopedics;  Laterality: Left;   THYROID  SURGERY     TUBAL LIGATION     Patient Active Problem List   Diagnosis Date Noted   Chronic neck pain 04/13/2023   Recurrent infections 03/02/2023   Chronic left shoulder pain 12/21/2022   Attention deficit hyperactivity disorder (ADHD), combined type 07/20/2022   Primary insomnia 09/09/2021   Chronic pain of right knee 08/12/2021   LGSIL on Pap smear of cervix 07/01/2020   Raynaud's disease 02/18/2020   IC  (interstitial cystitis) 02/18/2020   Chronic constipation 02/18/2020   DDD (degenerative disc disease), cervical 12/14/2019   Gastroesophageal reflux disease 05/16/2019   Hypothyroidism 03/19/2019   Hyperlipidemia 03/19/2019   Facet syndrome, lumbar 03/19/2019   Female pattern hair loss 03/19/2019   Lichen sclerosus 03/19/2019   Upper airway cough syndrome 03/19/2019   IUD (intrauterine device) in place 12/06/2016   MDD (major depressive disorder), recurrent episode (HCC) 05/10/2011    PCP: Alvan  REFERRING PROVIDER: Cristy  REFERRING DIAG: Lt Biceps Tenodesis with clavicle resection and acromial decompression  THERAPY DIAG:  Acute pain of left shoulder  Muscle weakness (generalized)  Rationale for Evaluation and Treatment: Rehabilitation  ONSET DATE: 08/25/23  SUBJECTIVE:  SUBJECTIVE STATEMENT: Pt states she feels achey. She states overall she is ok   Eval: Pt is s/p Lt biceps tenodesis with clavicle resection and acromial decompression. She has been using tylenol  and ice for pain control. She has been sleeping in the recliner and has been sleeping well. Hand dominance: Right  PERTINENT HISTORY: DDD, graves disease  PAIN:  Are you having pain? Yes: NPRS scale: 2/10 currently, 8/10 at worst Pain location: Lt shoulder Pain description: spasms Aggravating factors: end of day Relieving factors: ice, meds  PRECAUTIONS: Shoulder - see protocol   WEIGHT BEARING RESTRICTIONS: NWB Lt UE  FALLS:  Has patient fallen in last 6 months? 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    PATIENT SURVEYS:   Quick Dash 56.8     SENSATION: Numbness/tingling in Lt hand with full elbow flexion  POSTURE: Pt wearing sling on Lt UE  UPPER EXTREMITY ROM:   Passive ROM Right eval Left eval  Shoulder flexion  84  Shoulder extension    Shoulder abduction  Not assessed due to protocol  Shoulder adduction    Shoulder internal rotation  45  Shoulder external rotation  21  Elbow flexion    Elbow extension    Wrist flexion    Wrist extension    Wrist ulnar deviation    Wrist radial deviation    Wrist pronation    Wrist supination    (Blank rows = not tested)  UPPER EXTREMITY MMT: Not assessed due to surgery MMT Right eval Left eval  Shoulder flexion    Shoulder extension    Shoulder abduction    Shoulder adduction    Shoulder internal rotation    Shoulder external rotation    Middle trapezius    Lower trapezius    Elbow flexion    Elbow extension    Wrist flexion    Wrist extension    Wrist ulnar deviation    Wrist radial deviation    Wrist pronation    Wrist supination    Grip strength (lbs)    (Blank rows = not tested)   PALPATION:  Stitches intact Lt shoulder   [facility] Adult PT Treatment:                                                DATE: 09/14/23 Therapeutic Exercise: Scap squeeze Elbow flex/ext Wrist flex/ext AAROM shoulder ext AAROM shoulder ER Manual Therapy: PROM Lt shoulder scaption, ER per protocol STM Lt shoulder, biceps  Modalities: Vaso x 10 min 34 deg; low pressure   [facility] Adult PT Treatment:                                                DATE: 09/07/23 Therapeutic Exercise: Wrist flex/ext Forearm supination/pronation Elbow flexion/extension Grip with sponge  Scap squeeze  Manual Therapy: STM through the L shoulder girdle  PROM L shoulder pt supine shoulder flexion 125; scaption ~ 90; ER ~ 55-60 in scapular plane  Modalities: Vaso x 10 min 34 deg; low pressure  TREATMENT DATE: 08/31/23 See HEP Pt educated on PT POC and goals, protocol, anticipated progression   PATIENT EDUCATION: Education details: PT POC and goals, HEP, protocol, precautions Person educated: Patient Education method: Explanation, Demonstration, and Handouts Education comprehension: verbalized understanding and returned demonstration  HOME EXERCISE PROGRAM: Access Code: [email or web address] URL: [email or web address] Date: 08/31/2023 Prepared by: Darice Conine  Exercises - Seated Scapular Retraction  - 1 x daily - 7 x weekly - 3 sets - 10 reps - Standing Elbow Flexion Extension AROM  - 1 x daily - 7 x weekly - 3 sets - 10 reps - Seated Forearm Pronation and Supination AROM  - 1 x daily - 7 x weekly - 3 sets - 10 reps - Supine Shoulder Flexion Extension AAROM with Dowel  - 1 x daily - 7 x weekly - 3 sets - 10 reps - Standing Shoulder External Rotation AAROM with Dowel  - 1 x daily - 7 x weekly - 3 sets - 10 reps  ASSESSMENT:  CLINICAL IMPRESSION: Pt continues to progress well. She is improving PROM. She sees MD next week  Eval: Patient is a 48 y.o. female who was seen today for physical therapy evaluation and treatment for Lt bicep tenodesis with distal clavicle resection and acromial decompression. She presents with decreased strength, ROM and functional use and increased pain. She will benefit from skilled PT to address deficits and improve functional mobility  and independence.   OBJECTIVE IMPAIRMENTS: decreased activity tolerance, decreased ROM, decreased strength, impaired UE functional use, and pain.    GOALS: Goals reviewed with patient? Yes  SHORT TERM GOALS: Target date: 09/28/2023    Pt will be independent with initial HEP Baseline: Goal status: INITIAL  2.  Pt will demo 140 degrees of shoulder flexion PROM Baseline:  Goal status: INITIAL    LONG TERM GOALS: Target date: 10/26/2023    Pt will be  independent with advanced HEP Baseline:  Goal status: INITIAL  2.  Pt will improve QuickDash to <= 46 to demo improved functional mobility Baseline:  Goal status: INITIAL  3.  Pt will demo 4+/5 Lt shoulder strength to improve functional mobility Baseline:  Goal status: INITIAL  4.  Pt will return to exercise with pain <= 2/10 Baseline:  Goal status: INITIAL  5.  Pt will improve internal rotation to be able to fasten bra without difficulty Baseline:  Goal status: INITIAL    PLAN:  PT FREQUENCY: 1-2x/week  PT DURATION: 8 weeks  PLANNED INTERVENTIONS: 97164- PT Re-evaluation, 97110-Therapeutic exercises, 97530- Therapeutic activity, [id number]- Neuromuscular re-education, 97535- Self Care, 02859- Manual therapy, [id number]- Aquatic Therapy, H9716- Electrical stimulation (unattended), 97016- Vasopneumatic device, [id number]- Ionotophoresis 4mg /ml Dexamethasone , Patient/Family education, Taping, Cryotherapy, and Moist heat  PLAN FOR NEXT SESSION: progress per protocol   Marley Pakula, PT 09/14/2023, 2:08 PM

## 2023-09-15 ENCOUNTER — Telehealth: Payer: Self-pay | Admitting: *Deleted

## 2023-09-15 MED ORDER — ERYTHROMYCIN 5 MG/GM OP OINT: 1.0000 | TOPICAL_OINTMENT | Freq: Three times a day (TID) | OPHTHALMIC | 0 refills | Status: DC

## 2023-09-15 NOTE — Telephone Encounter (Signed)
 Pt stated that her L eye is still watering. She would like for Dr. Alvan to call in the eye drops that were suggested for her to use.  Pt would like these sent to [facility].

## 2023-09-15 NOTE — Telephone Encounter (Signed)
 Meds ordered this encounter  Medications   erythromycin ophthalmic ointment    Sig: Place 1 Application into the left eye 3 (three) times daily. X 7 days    Dispense:  3.5 g    Refill:  0

## 2023-09-21 ENCOUNTER — Encounter: Payer: Self-pay | Admitting: Physical Therapy

## 2023-09-21 ENCOUNTER — Ambulatory Visit: Admitting: Physical Therapy

## 2023-09-21 DIAGNOSIS — M25512 Pain in left shoulder: Secondary | ICD-10-CM | POA: Diagnosis not present

## 2023-09-21 DIAGNOSIS — M6281 Muscle weakness (generalized): Secondary | ICD-10-CM

## 2023-09-21 NOTE — Therapy (Signed)
 OUTPATIENT PHYSICAL THERAPY SHOULDER TREATMENT   Patient Name: April Webster MRN: [id number] DOB:[birth year], 48 y.o., female Today's Date: 09/21/2023  END OF SESSION:  PT End of Session - 09/21/23 1439     Visit Number 4    Number of Visits 16    Date for PT Re-Evaluation 10/26/23    Authorization Type Aet[employer]    PT Start Time 1400    PT Stop Time 1445    PT Time Calculation (min) 45 min    Activity Tolerance Patient tolerated treatment well    Behavior During Therapy WFL for tasks assessed/performed            Past Medical History:  Diagnosis Date   Chronic idiopathic constipation    DDD (degenerative disc disease), cervical and lumbar    Female pattern hair loss    Graves disease    Hyperlipidemia    Hypertension    Hypothyroidism    Lichen sclerosus    Raynaud's disease    Past Surgical History:  Procedure Laterality Date   ABDOMINOPLASTY     AUGMENTATION MAMMAPLASTY Bilateral 05/2019   Silicone    CHOLECYSTECTOMY     DILITATION & CURRETTAGE/HYSTROSCOPY WITH NOVASURE ABLATION N/A 02/09/2022   Procedure: DILATATION & CURETTAGE/HYSTEROSCOPY WITH NOVASURE ABLATION, REMOVAL OF IUD;  Surgeon: Cleatus Moccasin, MD;  Location: [facility];  Service: Gynecology;  Laterality: N/A;   PLACEMENT OF BREAST IMPLANTS     POSTERIOR LUMBAR FUSION 2 WITH HARDWARE REMOVAL Left 08/25/2023   Procedure: ARTHROSCOPY, SHOULDER WITH DEBRIDEMENT, BIOTENDONESIS REPAIR;  Surgeon: Cristy Bonner DASEN, MD;  Location: [facility];  Service: Orthopedics;  Laterality: Left;   THYROID  SURGERY     TUBAL LIGATION     Patient Active Problem List   Diagnosis Date Noted   Chronic neck pain 04/13/2023   Recurrent infections 03/02/2023   Chronic left shoulder pain 12/21/2022   Attention deficit hyperactivity disorder (ADHD), combined type 07/20/2022   Primary insomnia 09/09/2021   Chronic pain of right knee 08/12/2021   LGSIL on Pap smear of cervix 07/01/2020   Raynaud's disease 02/18/2020   IC  (interstitial cystitis) 02/18/2020   Chronic constipation 02/18/2020   DDD (degenerative disc disease), cervical 12/14/2019   Gastroesophageal reflux disease 05/16/2019   Hypothyroidism 03/19/2019   Hyperlipidemia 03/19/2019   Facet syndrome, lumbar 03/19/2019   Female pattern hair loss 03/19/2019   Lichen sclerosus 03/19/2019   Upper airway cough syndrome 03/19/2019   IUD (intrauterine device) in place 12/06/2016   MDD (major depressive disorder), recurrent episode (HCC) 05/10/2011    PCP: Alvan  REFERRING PROVIDER: Cristy  REFERRING DIAG: Lt Biceps Tenodesis with clavicle resection and acromial decompression  THERAPY DIAG:  Acute pain of left shoulder  Muscle weakness (generalized)  Rationale for Evaluation and Treatment: Rehabilitation  ONSET DATE: 08/25/23  SUBJECTIVE:  SUBJECTIVE STATEMENT: Pt states she tried on a new dress and had trouble getting it off. She ended up having to lift her arm and her shoulder has been sore since then. She sees MD tomorrow   Eval: Pt is s/p Lt biceps tenodesis with clavicle resection and acromial decompression. She has been using tylenol  and ice for pain control. She has been sleeping in the recliner and has been sleeping well. Hand dominance: Right  PERTINENT HISTORY: DDD, graves disease  PAIN:  Are you having pain? Yes: NPRS scale: 5/10 currently, 8/10 at worst Pain location: Lt shoulder Pain description: spasms Aggravating factors: end of day Relieving factors: ice, meds  PRECAUTIONS: Shoulder - see protocol   WEIGHT BEARING RESTRICTIONS: NWB Lt UE  FALLS:  Has patient fallen in last 6 months? 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    PATIENT SURVEYS:  Quick Dash 56.8     SENSATION: Numbness/tingling in Lt hand with full elbow flexion  POSTURE: Pt wearing sling on Lt UE  UPPER EXTREMITY ROM:   Passive ROM Right eval Left eval Left 09/21/23  Shoulder flexion  84 151  Shoulder extension     Shoulder abduction  Not assessed due to protocol   Shoulder adduction     Shoulder internal rotation  45   Shoulder external rotation  21 44  Elbow flexion     Elbow extension     Wrist flexion     Wrist extension     Wrist ulnar deviation     Wrist radial deviation     Wrist pronation     Wrist supination     (Blank rows = not tested)  UPPER EXTREMITY MMT: Not assessed due to surgery MMT Right eval Left eval  Shoulder flexion    Shoulder extension    Shoulder abduction    Shoulder adduction    Shoulder internal rotation    Shoulder external rotation    Middle trapezius    Lower trapezius    Elbow flexion    Elbow extension    Wrist flexion    Wrist extension    Wrist ulnar deviation    Wrist radial deviation    Wrist pronation    Wrist supination    Grip strength (lbs)    (Blank rows = not tested)   PALPATION:  Stitches intact Lt shoulder   [facility] Adult PT Treatment:                                                DATE: 09/21/23 Therapeutic Exercise: Isometrics Flexion Extension Abduction Scap squeeze Elbow flex/ext Manual Therapy: PROM Lt shoulder per protocol STM Lt shoulder musculature, biceps  Modalities: Vaso x 10 min 34 deg; low pressure   [facility] Adult PT Treatment:                                                DATE: 09/14/23 Therapeutic Exercise: Scap squeeze Elbow flex/ext Wrist flex/ext AAROM shoulder ext AAROM shoulder ER Manual Therapy: PROM Lt shoulder scaption,  ER per protocol STM Lt shoulder, biceps  Modalities: Vaso x 10 min 34 deg; low pressure   [facility] Adult PT Treatment:                                                 DATE: 09/07/23 Therapeutic Exercise: Wrist flex/ext Forearm supination/pronation Elbow flexion/extension Grip with sponge  Scap squeeze  Manual Therapy: STM through the L shoulder girdle  PROM L shoulder pt supine shoulder flexion 125; scaption ~ 90; ER ~ 55-60 in scapular plane  Modalities: Vaso x 10 min 34 deg; low pressure    PATIENT EDUCATION: Education details: PT POC and goals, HEP, protocol, precautions Person educated: Patient Education method: Explanation, Demonstration, and Handouts Education comprehension: verbalized understanding and returned demonstration  HOME EXERCISE PROGRAM: Access Code: [email or web address] URL: [email or web address] Date: 09/21/2023 Prepared by: Darice Conine  Exercises - Seated Scapular Retraction  - 1 x daily - 7 x weekly - 3 sets - 10 reps - Standing Elbow Flexion Extension AROM  - 1 x daily - 7 x weekly - 3 sets - 10 reps - Supine Shoulder Flexion Extension AAROM with Dowel  - 1 x daily - 7 x weekly - 3 sets - 10 reps - Standing Shoulder External Rotation AAROM with Dowel  - 1 x daily - 7 x weekly - 3 sets - 10 reps - Isometric Shoulder Flexion at Wall  - 1 x daily - 7 x weekly - 1 sets - 10 reps - 3 seconds hold - Isometric Shoulder Extension at Wall  - 1 x daily - 7 x weekly - 1 sets - 10 reps - 3 seconds hold - Isometric Shoulder Abduction at Wall  - 1 x daily - 7 x weekly - 1 sets - 10 reps - 3 seconds hold  ASSESSMENT:  CLINICAL IMPRESSION: Progressed per protocol. Pt with good tolerance to isometrics. Her ROM has greatly improved. HEP updated. Plan to continue to progress per protocol and tolerance  Eval: Patient is a 48 y.o. female who was seen today for physical therapy evaluation and treatment for Lt bicep tenodesis with distal clavicle resection and acromial decompression. She presents with decreased strength, ROM and functional use and increased pain. She will benefit from skilled PT to  address deficits and improve functional mobility  and independence.   OBJECTIVE IMPAIRMENTS: decreased activity tolerance, decreased ROM, decreased strength, impaired UE functional use, and pain.    GOALS: Goals reviewed with patient? Yes  SHORT TERM GOALS: Target date: 09/28/2023    Pt will be independent with initial HEP Baseline: Goal status: MET  2.  Pt will demo 140 degrees of shoulder flexion PROM Baseline:  Goal status: MET    LONG TERM GOALS: Target date: 10/26/2023    Pt will be independent with advanced HEP Baseline:  Goal status: INITIAL  2.  Pt will improve QuickDash to <= 46 to demo improved functional mobility Baseline:  Goal status: INITIAL  3.  Pt will demo 4+/5 Lt shoulder strength to improve functional mobility Baseline:  Goal status: INITIAL  4.  Pt will return to exercise with pain <= 2/10 Baseline:  Goal status: INITIAL  5.  Pt will improve internal rotation to be able to fasten bra without difficulty Baseline:  Goal status: INITIAL    PLAN:  PT FREQUENCY: 1-2x/week  PT DURATION: 8 weeks  PLANNED  INTERVENTIONS: 97164- PT Re-evaluation, 97110-Therapeutic exercises, 97530- Therapeutic activity, [id number]- Neuromuscular re-education, 97535- Self Care, 02859- Manual therapy, [id number]- Aquatic Therapy, [phone]- Electrical stimulation (unattended), 97016- Vasopneumatic device, [id number]- Ionotophoresis 4mg /ml Dexamethasone , Patient/Family education, Taping, Cryotherapy, and Moist heat  PLAN FOR NEXT SESSION: progress per protocol   Shaquinta Peruski, PT 09/21/2023, 2:39 PM

## 2023-09-28 ENCOUNTER — Ambulatory Visit: Admitting: Physical Therapy

## 2023-09-28 ENCOUNTER — Encounter: Payer: Self-pay | Admitting: Physical Therapy

## 2023-09-28 DIAGNOSIS — M6281 Muscle weakness (generalized): Secondary | ICD-10-CM

## 2023-09-28 DIAGNOSIS — M25512 Pain in left shoulder: Secondary | ICD-10-CM

## 2023-09-28 NOTE — Therapy (Signed)
 OUTPATIENT PHYSICAL THERAPY SHOULDER TREATMENT   Patient Name: April Webster MRN: [id number] DOB:28-Nov-1975, 48 y.o., female Today's Date: 09/28/2023  END OF SESSION:  PT End of Session - 09/28/23 1357     Visit Number 5    Number of Visits 16    Date for PT Re-Evaluation 10/26/23    Authorization Type [employer]    PT Start Time 1315    PT Stop Time 1400    PT Time Calculation (min) 45 min    Activity Tolerance Patient tolerated treatment well    Behavior During Therapy WFL for tasks assessed/performed             Past Medical History:  Diagnosis Date   Chronic idiopathic constipation    DDD (degenerative disc disease), cervical and lumbar    Female pattern hair loss    Graves disease    Hyperlipidemia    Hypertension    Hypothyroidism    Lichen sclerosus    Raynaud's disease    Past Surgical History:  Procedure Laterality Date   ABDOMINOPLASTY     AUGMENTATION MAMMAPLASTY Bilateral 05/2019   Silicone    CHOLECYSTECTOMY     DILITATION & CURRETTAGE/HYSTROSCOPY WITH NOVASURE ABLATION N/A 02/09/2022   Procedure: DILATATION & CURETTAGE/HYSTEROSCOPY WITH NOVASURE ABLATION, REMOVAL OF IUD;  Surgeon: Cleatus Moccasin, MD;  Location: [facility];  Service: Gynecology;  Laterality: N/A;   PLACEMENT OF BREAST IMPLANTS     POSTERIOR LUMBAR FUSION 2 WITH HARDWARE REMOVAL Left 08/25/2023   Procedure: ARTHROSCOPY, SHOULDER WITH DEBRIDEMENT, BIOTENDONESIS REPAIR;  Surgeon: Cristy Bonner DASEN, MD;  Location: [facility];  Service: Orthopedics;  Laterality: Left;   THYROID  SURGERY     TUBAL LIGATION     Patient Active Problem List   Diagnosis Date Noted   Chronic neck pain 04/13/2023   Recurrent infections 03/02/2023   Chronic left shoulder pain 12/21/2022   Attention deficit hyperactivity disorder (ADHD), combined type 07/20/2022   Primary insomnia 09/09/2021   Chronic pain of right knee 08/12/2021   LGSIL on Pap smear of cervix 07/01/2020   Raynaud's disease 02/18/2020   IC  (interstitial cystitis) 02/18/2020   Chronic constipation 02/18/2020   DDD (degenerative disc disease), cervical 12/14/2019   Gastroesophageal reflux disease 05/16/2019   Hypothyroidism 03/19/2019   Hyperlipidemia 03/19/2019   Facet syndrome, lumbar 03/19/2019   Female pattern hair loss 03/19/2019   Lichen sclerosus 03/19/2019   Upper airway cough syndrome 03/19/2019   IUD (intrauterine device) in place 12/06/2016   MDD (major depressive disorder), recurrent episode (HCC) 05/10/2011    PCP: Alvan  REFERRING PROVIDER: Cristy  REFERRING DIAG: Lt Biceps Tenodesis with clavicle resection and acromial decompression  THERAPY DIAG:  Acute pain of left shoulder  Muscle weakness (generalized)  Rationale for Evaluation and Treatment: Rehabilitation  ONSET DATE: 08/25/23  SUBJECTIVE:  SUBJECTIVE STATEMENT: Pt states MD was pleased with progress   Eval: Pt is s/p Lt biceps tenodesis with clavicle resection and acromial decompression. She has been using tylenol  and ice for pain control. She has been sleeping in the recliner and has been sleeping well. Hand dominance: Right  PERTINENT HISTORY: DDD, graves disease  PAIN:  Are you having pain? Yes: NPRS scale: 5/10 currently, 8/10 at worst Pain location: Lt shoulder Pain description: spasms Aggravating factors: end of day Relieving factors: ice, meds  PRECAUTIONS: Shoulder - see protocol   WEIGHT BEARING RESTRICTIONS: NWB Lt UE  FALLS:  Has patient fallen in last 6 months? 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    PATIENT SURVEYS:  Quick Dash  56.8     SENSATION: Numbness/tingling in Lt hand with full elbow flexion  POSTURE: Pt wearing sling on Lt UE  UPPER EXTREMITY ROM:   Passive ROM Right eval Left eval Left 09/21/23  Shoulder flexion  84 151  Shoulder extension     Shoulder abduction  Not assessed due to protocol   Shoulder adduction     Shoulder internal rotation  45   Shoulder external rotation  21 44  Elbow flexion     Elbow extension     Wrist flexion     Wrist extension     Wrist ulnar deviation     Wrist radial deviation     Wrist pronation     Wrist supination     (Blank rows = not tested)  UPPER EXTREMITY MMT: Not assessed due to surgery MMT Right eval Left eval  Shoulder flexion    Shoulder extension    Shoulder abduction    Shoulder adduction    Shoulder internal rotation    Shoulder external rotation    Middle trapezius    Lower trapezius    Elbow flexion    Elbow extension    Wrist flexion    Wrist extension    Wrist ulnar deviation    Wrist radial deviation    Wrist pronation    Wrist supination    Grip strength (lbs)    (Blank rows = not tested)   PALPATION:  Stitches intact Lt shoulder   [facility] Adult PT Treatment:                                                DATE: 09/28/23 Therapeutic Exercise: Elbow flex/ext 3# DB x 10 Supination/pronation 3# DB x 10 Reactive isometrics red TB x 10 Flexion Extension ER Manual Therapy: STM Lt UT Scar massage  Modalities: Vaso x 10 min Lt shoulder low pressure 34 degrees   [facility] Adult PT Treatment:                                                DATE: 09/21/23 Therapeutic Exercise: Isometrics Flexion Extension Abduction Scap squeeze Elbow flex/ext Manual Therapy: PROM Lt shoulder per protocol STM Lt shoulder musculature, biceps  Modalities: Vaso x 10 min 34 deg; low pressure   [facility] Adult PT Treatment:  DATE: 09/14/23 Therapeutic Exercise: Scap squeeze Elbow flex/ext Wrist  flex/ext AAROM shoulder ext AAROM shoulder ER Manual Therapy: PROM Lt shoulder scaption, ER per protocol STM Lt shoulder, biceps  Modalities: Vaso x 10 min 34 deg; low pressure   [facility] Adult PT Treatment:                                                DATE: 09/07/23 Therapeutic Exercise: Wrist flex/ext Forearm supination/pronation Elbow flexion/extension Grip with sponge  Scap squeeze  Manual Therapy: STM through the L shoulder girdle  PROM L shoulder pt supine shoulder flexion 125; scaption ~ 90; ER ~ 55-60 in scapular plane  Modalities: Vaso x 10 min 34 deg; low pressure    PATIENT EDUCATION: Education details: PT POC and goals, HEP, protocol, precautions Person educated: Patient Education method: Explanation, Demonstration, and Handouts Education comprehension: verbalized understanding and returned demonstration  HOME EXERCISE PROGRAM: Access Code: [email or web address] URL: [email or web address] Date: 09/28/2023 Prepared by: Darice Conine  Exercises - Seated Scapular Retraction  - 1 x daily - 7 x weekly - 3 sets - 10 reps - Standing Shoulder External Rotation AAROM with Dowel  - 1 x daily - 7 x weekly - 3 sets - 10 reps - Shoulder External Rotation Reactive Isometrics  - 1 x daily - 7 x weekly - 3 sets - 10 reps - Standing Shoulder Flexion Reactive Isometric  - 1 x daily - 7 x weekly - 3 sets - 10 reps - Shoulder Extension Reactive Isometrics with Elbow Extended  - 1 x daily - 7 x weekly - 3 sets - 10 reps - Standing Bicep Curls Supinated with Dumbbells  - 1 x daily - 7 x weekly - 3 sets - 10 reps  ASSESSMENT:  CLINICAL IMPRESSION: Pt does well with progression to reactive isometrics. Updated HEP. She c/o UT tightness and responds well to manual work  Eval: Patient is a 48 y.o. female who was seen today for physical therapy evaluation and treatment for Lt bicep tenodesis with distal clavicle resection and acromial decompression. She presents with decreased  strength, ROM and functional use and increased pain. She will benefit from skilled PT to address deficits and improve functional mobility  and independence.   OBJECTIVE IMPAIRMENTS: decreased activity tolerance, decreased ROM, decreased strength, impaired UE functional use, and pain.    GOALS: Goals reviewed with patient? Yes  SHORT TERM GOALS: Target date: 09/28/2023    Pt will be independent with initial HEP Baseline: Goal status: MET  2.  Pt will demo 140 degrees of shoulder flexion PROM Baseline:  Goal status: MET    LONG TERM GOALS: Target date: 10/26/2023    Pt will be independent with advanced HEP Baseline:  Goal status: INITIAL  2.  Pt will improve QuickDash to <= 46 to demo improved functional mobility Baseline:  Goal status: INITIAL  3.  Pt will demo 4+/5 Lt shoulder strength to improve functional mobility Baseline:  Goal status: INITIAL  4.  Pt will return to exercise with pain <= 2/10 Baseline:  Goal status: INITIAL  5.  Pt will improve internal rotation to be able to fasten bra without difficulty Baseline:  Goal status: INITIAL    PLAN:  PT FREQUENCY: 1-2x/week  PT DURATION: 8 weeks  PLANNED INTERVENTIONS: 97164- PT Re-evaluation, 97110-Therapeutic exercises, 97530- Therapeutic activity, [id number]- Neuromuscular re-education, 97535- Self Care,  02859- Manual therapy, [id number]- Aquatic Therapy, [phone]- Electrical stimulation (unattended), 97016- Vasopneumatic device, [id number]- Ionotophoresis 4mg /ml Dexamethasone , Patient/Family education, Taping, Cryotherapy, and Moist heat  PLAN FOR NEXT SESSION: progress per protocol   Dorothye Berni, PT 09/28/2023, 1:57 PM

## 2023-09-29 ENCOUNTER — Other Ambulatory Visit (HOSPITAL_COMMUNITY): Payer: Self-pay

## 2023-09-29 ENCOUNTER — Other Ambulatory Visit: Payer: Self-pay | Admitting: Sports Medicine

## 2023-09-29 ENCOUNTER — Other Ambulatory Visit: Payer: Self-pay

## 2023-09-29 ENCOUNTER — Other Ambulatory Visit: Payer: Self-pay | Admitting: Family Medicine

## 2023-09-29 MED ORDER — TIZANIDINE HCL 4 MG PO TABS
4.0000 mg | ORAL_TABLET | Freq: Every day | ORAL | 3 refills | Status: AC
  Filled 2023-09-29: qty 90, 90d supply, fill #0
  Filled 2023-12-23: qty 90, 90d supply, fill #1
  Filled 2024-03-22: qty 90, 90d supply, fill #2

## 2023-09-30 ENCOUNTER — Other Ambulatory Visit: Payer: Self-pay

## 2023-09-30 ENCOUNTER — Other Ambulatory Visit (HOSPITAL_COMMUNITY): Payer: Self-pay

## 2023-09-30 MED ORDER — LISDEXAMFETAMINE DIMESYLATE 50 MG PO CAPS
50.0000 mg | ORAL_CAPSULE | Freq: Every day | ORAL | 0 refills | Status: DC
  Filled 2023-09-30: qty 90, 90d supply, fill #0

## 2023-10-05 ENCOUNTER — Ambulatory Visit: Admitting: Physical Therapy

## 2023-10-05 ENCOUNTER — Encounter: Payer: Self-pay | Admitting: Physical Therapy

## 2023-10-05 DIAGNOSIS — M25512 Pain in left shoulder: Secondary | ICD-10-CM

## 2023-10-05 DIAGNOSIS — M6281 Muscle weakness (generalized): Secondary | ICD-10-CM | POA: Diagnosis not present

## 2023-10-05 NOTE — Therapy (Signed)
 OUTPATIENT PHYSICAL THERAPY SHOULDER TREATMENT   Patient Name: April Webster MRN: [id number] DOB:November 15, 1975, 48 y.o., female Today's Date: 10/05/2023  END OF SESSION:  PT End of Session - 10/05/23 1419     Visit Number 6    Number of Visits 16    Date for PT Re-Evaluation 10/26/23    Authorization Type [employer]    PT Start Time 1315    PT Stop Time 1408    PT Time Calculation (min) 53 min    Activity Tolerance Patient tolerated treatment well    Behavior During Therapy WFL for tasks assessed/performed              Past Medical History:  Diagnosis Date   Chronic idiopathic constipation    DDD (degenerative disc disease), cervical and lumbar    Female pattern hair loss    Graves disease    Hyperlipidemia    Hypertension    Hypothyroidism    Lichen sclerosus    Raynaud's disease    Past Surgical History:  Procedure Laterality Date   ABDOMINOPLASTY     AUGMENTATION MAMMAPLASTY Bilateral 05/2019   Silicone    CHOLECYSTECTOMY     DILITATION & CURRETTAGE/HYSTROSCOPY WITH NOVASURE ABLATION N/A 02/09/2022   Procedure: DILATATION & CURETTAGE/HYSTEROSCOPY WITH NOVASURE ABLATION, REMOVAL OF IUD;  Surgeon: Cleatus Moccasin, MD;  Location: [facility];  Service: Gynecology;  Laterality: N/A;   PLACEMENT OF BREAST IMPLANTS     POSTERIOR LUMBAR FUSION 2 WITH HARDWARE REMOVAL Left 08/25/2023   Procedure: ARTHROSCOPY, SHOULDER WITH DEBRIDEMENT, BIOTENDONESIS REPAIR;  Surgeon: Cristy Bonner DASEN, MD;  Location: [facility];  Service: Orthopedics;  Laterality: Left;   THYROID  SURGERY     TUBAL LIGATION     Patient Active Problem List   Diagnosis Date Noted   Chronic neck pain 04/13/2023   Recurrent infections 03/02/2023   Chronic left shoulder pain 12/21/2022   Attention deficit hyperactivity disorder (ADHD), combined type 07/20/2022   Primary insomnia 09/09/2021   Chronic pain of right knee 08/12/2021   LGSIL on Pap smear of cervix 07/01/2020   Raynaud's disease 02/18/2020    IC (interstitial cystitis) 02/18/2020   Chronic constipation 02/18/2020   DDD (degenerative disc disease), cervical 12/14/2019   Gastroesophageal reflux disease 05/16/2019   Hypothyroidism 03/19/2019   Hyperlipidemia 03/19/2019   Facet syndrome, lumbar 03/19/2019   Female pattern hair loss 03/19/2019   Lichen sclerosus 03/19/2019   Upper airway cough syndrome 03/19/2019   IUD (intrauterine device) in place 12/06/2016   MDD (major depressive disorder), recurrent episode (HCC) 05/10/2011    PCP: Alvan  REFERRING PROVIDER: Cristy  REFERRING DIAG: Lt Biceps Tenodesis with clavicle resection and acromial decompression  THERAPY DIAG:  Acute pain of left shoulder  Muscle weakness (generalized)  Rationale for Evaluation and Treatment: Rehabilitation  ONSET DATE: 08/25/23  SUBJECTIVE:  SUBJECTIVE STATEMENT: 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 She has been using tylenol  and ice for pain control. She has been sleeping in the recliner and has been sleeping well. Hand dominance: Right  PERTINENT HISTORY: DDD, graves disease  PAIN:  Are you having pain? Yes: NPRS scale: 2/10 currently, 8/10 at worst Pain location: Lt shoulder Pain description: spasms Aggravating factors: end of day Relieving factors: ice, meds  PRECAUTIONS: Shoulder - see protocol   WEIGHT BEARING RESTRICTIONS: NWB Lt UE  FALLS:  Has patient fallen in last 6 months? 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    PATIENT  SURVEYS:  Quick Dash 56.8     SENSATION: Numbness/tingling in Lt hand with full elbow flexion  POSTURE: Pt wearing sling on Lt UE  UPPER EXTREMITY ROM:   Passive ROM Right eval Left eval Left 09/21/23  Shoulder flexion  84 151  Shoulder extension     Shoulder abduction  Not assessed due to protocol   Shoulder adduction     Shoulder internal rotation  45   Shoulder external rotation  21 44  Elbow flexion     Elbow extension     Wrist flexion     Wrist extension     Wrist ulnar deviation     Wrist radial deviation     Wrist pronation     Wrist supination     (Blank rows = not tested)  UPPER EXTREMITY MMT: Not assessed due to surgery MMT Right eval Left eval  Shoulder flexion    Shoulder extension    Shoulder abduction    Shoulder adduction    Shoulder internal rotation    Shoulder external rotation    Middle trapezius    Lower trapezius    Elbow flexion    Elbow extension    Wrist flexion    Wrist extension    Wrist ulnar deviation    Wrist radial deviation    Wrist pronation    Wrist supination    Grip strength (lbs)    (Blank rows = not tested)   PALPATION:  Stitches intact Lt shoulder   [facility] Adult PT Treatment:                                                DATE: 10/05/23 Therapeutic Exercise: Reactive isometrics green TB x 10 Flex Ext ER (red TB) IR (red TB) Bicep curl 3# 2 x 10 Pronation/supination 3# 2 x 10 IR stretch with strap 10 x 5 sec hold ER doorway stretch Manual Therapy: STM Lt shoulder musculature TPR serratus  Modalities: Vaso x 10 min Lt shoulder low pressure 34 degrees   [facility] Adult PT Treatment:                                                DATE: 09/28/23 Therapeutic Exercise: Elbow flex/ext 3# DB x 10 Supination/pronation 3# DB x 10  Reactive isometrics red TB x 10 Flexion Extension ER Manual Therapy: STM Lt UT Scar massage  Modalities: Vaso x 10 min Lt shoulder low pressure 34 degrees   [facility] Adult PT  Treatment:                                                DATE: 09/21/23 Therapeutic Exercise: Isometrics Flexion Extension Abduction Scap squeeze Elbow flex/ext Manual Therapy: PROM Lt shoulder per protocol STM Lt shoulder musculature, biceps  Modalities: Vaso x 10 min 34 deg; low pressure   [facility] Adult PT Treatment:                                                DATE: 09/14/23 Therapeutic Exercise: Scap squeeze Elbow flex/ext Wrist flex/ext AAROM shoulder ext AAROM shoulder ER Manual Therapy: PROM Lt shoulder scaption, ER per protocol STM Lt shoulder, biceps  Modalities: Vaso x 10 min 34 deg; low pressure   [facility] Adult PT Treatment:                                                DATE: 09/07/23 Therapeutic Exercise: Wrist flex/ext Forearm supination/pronation Elbow flexion/extension Grip with sponge  Scap squeeze  Manual Therapy: STM through the L shoulder girdle  PROM L shoulder pt supine shoulder flexion 125; scaption ~ 90; ER ~ 55-60 in scapular plane  Modalities: Vaso x 10 min 34 deg; low pressure    PATIENT EDUCATION: Education details: PT POC and goals, HEP, protocol, precautions Person educated: Patient Education method: Explanation, Demonstration, and Handouts Education comprehension: verbalized understanding and returned demonstration  HOME EXERCISE PROGRAM: Access Code: [email or web address] URL: [email or web address] Date: 10/05/2023 Prepared by: Darice Conine  Exercises - Seated Scapular Retraction  - 1 x daily - 7 x weekly - 3 sets - 10 reps - Standing Shoulder External Rotation AAROM with Dowel  - 1 x daily - 7 x weekly - 3 sets - 10 reps - Shoulder External Rotation Reactive Isometrics  - 1 x daily - 7 x weekly - 3 sets - 10 reps - Standing Shoulder Flexion Reactive Isometric  - 1 x daily - 7 x weekly - 3 sets - 10 reps - Shoulder Extension Reactive Isometrics with Elbow Extended  - 1 x daily - 7 x weekly - 3 sets - 10 reps - Standing Bicep  Curls Supinated with Dumbbells  - 1 x daily - 7 x weekly - 3 sets - 10 reps - Standing Shoulder Internal Rotation Stretch with Towel  - 1 x daily - 7 x weekly - 1 sets - 10 reps - 10 seconds hold - Standing Infraspinatus/Teres Minor Release with Ball at [employer]  - 1 x daily - 7 x weekly - 1 sets - 1 reps - 2-3 minutes hold  ASSESSMENT:  CLINICAL IMPRESSION: Pt with increased mm spasticity throughout Lt shoulder musculature, she responds well to manual work. PT advised pt to try ball release to posterior shoulder as well as snow angel stretch for anterior shoulder tightness.  Eval: Patient is a 48 y.o. female who was seen today for physical  therapy evaluation and treatment for Lt bicep tenodesis with distal clavicle resection and acromial decompression. She presents with decreased strength, ROM and functional use and increased pain. She will benefit from skilled PT to address deficits and improve functional mobility  and independence.   OBJECTIVE IMPAIRMENTS: decreased activity tolerance, decreased ROM, decreased strength, impaired UE functional use, and pain.    GOALS: Goals reviewed with patient? Yes  SHORT TERM GOALS: Target date: 09/28/2023    Pt will be independent with initial HEP Baseline: Goal status: MET  2.  Pt will demo 140 degrees of shoulder flexion PROM Baseline:  Goal status: MET    LONG TERM GOALS: Target date: 10/26/2023    Pt will be independent with advanced HEP Baseline:  Goal status: INITIAL  2.  Pt will improve QuickDash to <= 46 to demo improved functional mobility Baseline:  Goal status: INITIAL  3.  Pt will demo 4+/5 Lt shoulder strength to improve functional mobility Baseline:  Goal status: INITIAL  4.  Pt will return to exercise with pain <= 2/10 Baseline:  Goal status: INITIAL  5.  Pt will improve internal rotation to be able to fasten bra without difficulty Baseline:  Goal status: INITIAL    PLAN:  PT FREQUENCY: 1-2x/week  PT  DURATION: 8 weeks  PLANNED INTERVENTIONS: 97164- PT Re-evaluation, 97110-Therapeutic exercises, 97530- Therapeutic activity, [id number]- Neuromuscular re-education, 97535- Self Care, 02859- Manual therapy, [id number]- Aquatic Therapy, H9716- Electrical stimulation (unattended), 97016- Vasopneumatic device, [id number]- Ionotophoresis 4mg /ml Dexamethasone , Patient/Family education, Taping, Cryotherapy, and Moist heat  PLAN FOR NEXT SESSION: GIVE UPDATED HEP! Add abduction and cross body adduction stretch, progress per protocol   Krystine Pabst, PT 10/05/2023, 2:20 PM

## 2023-10-11 ENCOUNTER — Other Ambulatory Visit: Payer: Self-pay | Admitting: Family Medicine

## 2023-10-11 DIAGNOSIS — D7589 Other specified diseases of blood and blood-forming organs: Secondary | ICD-10-CM | POA: Diagnosis not present

## 2023-10-11 DIAGNOSIS — R000 Tachycardia, unspecified: Secondary | ICD-10-CM | POA: Diagnosis not present

## 2023-10-11 NOTE — Progress Notes (Signed)
 Orders Placed This Encounter  Procedures   TSH   CMP14+EGFR   CBC with Differential/Platelet   B12 and Folate Panel

## 2023-10-12 ENCOUNTER — Ambulatory Visit: Payer: Self-pay | Admitting: Family Medicine

## 2023-10-12 ENCOUNTER — Ambulatory Visit: Admitting: Physical Therapy

## 2023-10-12 ENCOUNTER — Encounter: Payer: Self-pay | Admitting: Physical Therapy

## 2023-10-12 DIAGNOSIS — M6281 Muscle weakness (generalized): Secondary | ICD-10-CM

## 2023-10-12 DIAGNOSIS — M25512 Pain in left shoulder: Secondary | ICD-10-CM | POA: Diagnosis not present

## 2023-10-12 DIAGNOSIS — R232 Flushing: Secondary | ICD-10-CM

## 2023-10-12 LAB — CMP14+EGFR
ALT: 31 IU/L (ref 0–32)
AST: 32 IU/L (ref 0–40)
Albumin: 4.7 g/dL (ref 3.9–4.9)
Alkaline Phosphatase: 94 IU/L (ref 44–121)
BUN/Creatinine Ratio: 25 — ABNORMAL HIGH (ref 9–23)
BUN: 19 mg/dL (ref 6–24)
Bilirubin Total: 0.3 mg/dL (ref 0.0–1.2)
CO2: 22 mmol/L (ref 20–29)
Calcium: 9.9 mg/dL (ref 8.7–10.2)
Chloride: 98 mmol/L (ref 96–106)
Creatinine, Ser: 0.77 mg/dL (ref 0.57–1.00)
Globulin, Total: 2.2 g/dL (ref 1.5–4.5)
Glucose: 87 mg/dL (ref 70–99)
Potassium: 3.8 mmol/L (ref 3.5–5.2)
Sodium: 138 mmol/L (ref 134–144)
Total Protein: 6.9 g/dL (ref 6.0–8.5)
eGFR: 96 mL/min/1.73 (ref >59)

## 2023-10-12 LAB — CBC WITH DIFFERENTIAL/PLATELET
Basophils Absolute: 0 x10E3/uL (ref 0.0–0.2)
Basos: 1 %
EOS (ABSOLUTE): 0.1 x10E3/uL (ref 0.0–0.4)
Eos: 2 %
Hematocrit: 42 % (ref 34.0–46.6)
Hemoglobin: 13.8 g/dL (ref 11.1–15.9)
Immature Grans (Abs): 0 x10E3/uL (ref 0.0–0.1)
Immature Granulocytes: 0 %
Lymphocytes Absolute: 1.9 x10E3/uL (ref 0.7–3.1)
Lymphs: 38 %
MCH: 33 pg (ref 26.6–33.0)
MCHC: 32.9 g/dL (ref 31.5–35.7)
MCV: 101 fL — ABNORMAL HIGH (ref 79–97)
Monocytes Absolute: 0.6 x10E3/uL (ref 0.1–0.9)
Monocytes: 12 %
Neutrophils Absolute: 2.2 x10E3/uL (ref 1.4–7.0)
Neutrophils: 47 %
Platelets: 254 x10E3/uL (ref 150–450)
RBC: 4.18 x10E6/uL (ref 3.77–5.28)
RDW: 12 % (ref 11.7–15.4)
WBC: 4.8 x10E3/uL (ref 3.4–10.8)

## 2023-10-12 LAB — B12 AND FOLATE PANEL
Folate: 12.4 ng/mL (ref >3.0)
Vitamin B-12: 784 pg/mL (ref 232–1245)

## 2023-10-12 LAB — TSH: TSH: 1.58 u[IU]/mL (ref 0.450–4.500)

## 2023-10-12 NOTE — Progress Notes (Signed)
 HI April Webster, your liver function looks better!!! Your blood count and thyroid  look great. B12 and folate are normal.

## 2023-10-12 NOTE — Therapy (Signed)
 OUTPATIENT PHYSICAL THERAPY SHOULDER TREATMENT   Patient Name: April Webster MRN: [id number] DOB:1975/10/30, 48 y.o., female Today's Date: 10/12/2023  END OF SESSION:  PT End of Session - 10/12/23 1444     Visit Number 7    Number of Visits 16    Date for PT Re-Evaluation 10/26/23    Authorization Type [employer]    PT Start Time 1400    PT Stop Time 1448    PT Time Calculation (min) 48 min    Activity Tolerance Patient tolerated treatment well    Behavior During Therapy WFL for tasks assessed/performed               Past Medical History:  Diagnosis Date   Chronic idiopathic constipation    DDD (degenerative disc disease), cervical and lumbar    Female pattern hair loss    Graves disease    Hyperlipidemia    Hypertension    Hypothyroidism    Lichen sclerosus    Raynaud's disease    Past Surgical History:  Procedure Laterality Date   ABDOMINOPLASTY     AUGMENTATION MAMMAPLASTY Bilateral 05/2019   Silicone    CHOLECYSTECTOMY     DILITATION & CURRETTAGE/HYSTROSCOPY WITH NOVASURE ABLATION N/A 02/09/2022   Procedure: DILATATION & CURETTAGE/HYSTEROSCOPY WITH NOVASURE ABLATION, REMOVAL OF IUD;  Surgeon: Cleatus Moccasin, MD;  Location: [facility];  Service: Gynecology;  Laterality: N/A;   PLACEMENT OF BREAST IMPLANTS     POSTERIOR LUMBAR FUSION 2 WITH HARDWARE REMOVAL Left 08/25/2023   Procedure: ARTHROSCOPY, SHOULDER WITH DEBRIDEMENT, BIOTENDONESIS REPAIR;  Surgeon: Cristy Bonner DASEN, MD;  Location: [facility];  Service: Orthopedics;  Laterality: Left;   THYROID  SURGERY     TUBAL LIGATION     Patient Active Problem List   Diagnosis Date Noted   Chronic neck pain 04/13/2023   Recurrent infections 03/02/2023   Chronic left shoulder pain 12/21/2022   Attention deficit hyperactivity disorder (ADHD), combined type 07/20/2022   Primary insomnia 09/09/2021   Chronic pain of right knee 08/12/2021   LGSIL on Pap smear of cervix 07/01/2020   Raynaud's disease 02/18/2020    IC (interstitial cystitis) 02/18/2020   Chronic constipation 02/18/2020   DDD (degenerative disc disease), cervical 12/14/2019   Gastroesophageal reflux disease 05/16/2019   Hypothyroidism 03/19/2019   Hyperlipidemia 03/19/2019   Facet syndrome, lumbar 03/19/2019   Female pattern hair loss 03/19/2019   Lichen sclerosus 03/19/2019   Upper airway cough syndrome 03/19/2019   IUD (intrauterine device) in place 12/06/2016   MDD (major depressive disorder), recurrent episode (HCC) 05/10/2011    PCP: Alvan  REFERRING PROVIDER: Cristy  REFERRING DIAG: Lt Biceps Tenodesis with clavicle resection and acromial decompression  THERAPY DIAG:  Acute pain of left shoulder  Muscle weakness (generalized)  Rationale for Evaluation and Treatment: Rehabilitation  ONSET DATE: 08/25/23  SUBJECTIVE:  SUBJECTIVE STATEMENT: Pt with no new complaints   Eval: Pt is s/p Lt biceps tenodesis with clavicle resection and acromial decompression. She has been using tylenol  and ice for pain control. She has been sleeping in the recliner and has been sleeping well. Hand dominance: Right  PERTINENT HISTORY: DDD, graves disease  PAIN:  Are you having pain? Yes: NPRS scale: 2/10 currently, 8/10 at worst Pain location: Lt shoulder Pain description: spasms Aggravating factors: end of day Relieving factors: ice, meds  PRECAUTIONS: Shoulder - see protocol   WEIGHT BEARING RESTRICTIONS: NWB Lt UE  FALLS:  Has patient fallen in last 6 months? 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    PATIENT SURVEYS:  Quick Dash  56.8     SENSATION: Numbness/tingling in Lt hand with full elbow flexion  POSTURE: Pt wearing sling on Lt UE  UPPER EXTREMITY ROM:   Passive ROM Right eval Left eval Left 09/21/23  Shoulder flexion  84 151  Shoulder extension     Shoulder abduction  Not assessed due to protocol   Shoulder adduction     Shoulder internal rotation  45   Shoulder external rotation  21 44  Elbow flexion     Elbow extension     Wrist flexion     Wrist extension     Wrist ulnar deviation     Wrist radial deviation     Wrist pronation     Wrist supination     (Blank rows = not tested)  UPPER EXTREMITY MMT: Not assessed due to surgery MMT Right eval Left eval  Shoulder flexion    Shoulder extension    Shoulder abduction    Shoulder adduction    Shoulder internal rotation    Shoulder external rotation    Middle trapezius    Lower trapezius    Elbow flexion    Elbow extension    Wrist flexion    Wrist extension    Wrist ulnar deviation    Wrist radial deviation    Wrist pronation    Wrist supination    Grip strength (lbs)    (Blank rows = not tested)   PALPATION:  Stitches intact Lt shoulder   [facility] Adult PT Treatment:                                                DATE: 10/12/23 Therapeutic Exercise: IR stretch with strap 10 x 5 sec hold ER doorway stretch Standing serratus punch red TB 2 x 10 Reactive isometrics green TB x 10 Flex Ext Abd IR (red TB) ER (red TB) Manual Therapy: STM Lt shoulder musculature TPR serratus  Modalities: Vaso x 10 min Lt shoulder low pressure 34 degrees   [facility] Adult PT Treatment:                                                DATE: 10/05/23 Therapeutic Exercise: Reactive isometrics green TB x 10 Flex Ext ER (red TB) IR (red TB) Bicep curl 3# 2 x 10 Pronation/supination 3#  2 x 10 IR stretch with strap 10 x 5 sec hold ER doorway stretch Manual Therapy: STM Lt shoulder musculature TPR serratus  Modalities: Vaso x 10 min Lt  shoulder low pressure 34 degrees   [facility] Adult PT Treatment:                                                DATE: 09/28/23 Therapeutic Exercise: Elbow flex/ext 3# DB x 10 Supination/pronation 3# DB x 10 Reactive isometrics red TB x 10 Flexion Extension ER Manual Therapy: STM Lt UT Scar massage  Modalities: Vaso x 10 min Lt shoulder low pressure 34 degrees   [facility] Adult PT Treatment:                                                DATE: 09/21/23 Therapeutic Exercise: Isometrics Flexion Extension Abduction Scap squeeze Elbow flex/ext Manual Therapy: PROM Lt shoulder per protocol STM Lt shoulder musculature, biceps  Modalities: Vaso x 10 min 34 deg; low pressure    PATIENT EDUCATION: Education details: PT POC and goals, HEP, protocol, precautions Person educated: Patient Education method: Explanation, Demonstration, and Handouts Education comprehension: verbalized understanding and returned demonstration  HOME EXERCISE PROGRAM: Access Code: [email or web address] URL: [email or web address] Date: 10/05/2023 Prepared by: Darice Conine  Exercises - Seated Scapular Retraction  - 1 x daily - 7 x weekly - 3 sets - 10 reps - Standing Shoulder External Rotation AAROM with Dowel  - 1 x daily - 7 x weekly - 3 sets - 10 reps - Shoulder External Rotation Reactive Isometrics  - 1 x daily - 7 x weekly - 3 sets - 10 reps - Standing Shoulder Flexion Reactive Isometric  - 1 x daily - 7 x weekly - 3 sets - 10 reps - Shoulder Extension Reactive Isometrics with Elbow Extended  - 1 x daily - 7 x weekly - 3 sets - 10 reps - Standing Bicep Curls Supinated with Dumbbells  - 1 x daily - 7 x weekly - 3 sets - 10 reps - Standing Shoulder Internal Rotation Stretch with Towel  - 1 x daily - 7 x weekly - 1 sets - 10 reps - 10 seconds hold - Standing Infraspinatus/Teres Minor Release with Ball at Wall  - 1 x daily - 7 x weekly - 1 sets - 1 reps - 2-3 minutes hold  ASSESSMENT:  CLINICAL  IMPRESSION: Pt is progressing well within limits of protocol. She is able to demo full abduction ROM and is progressing IR to T8.  Eval: Patient is a 48 y.o. female who was seen today for physical therapy evaluation and treatment for Lt bicep tenodesis with distal clavicle resection and acromial decompression. She presents with decreased strength, ROM and functional use and increased pain. She will benefit from skilled PT to address deficits and improve functional mobility  and independence.   OBJECTIVE IMPAIRMENTS: decreased activity tolerance, decreased ROM, decreased strength, impaired UE functional use, and pain.    GOALS: Goals reviewed with patient? Yes  SHORT TERM GOALS: Target date: 09/28/2023    Pt will be independent with initial HEP Baseline: Goal status: MET  2.  Pt will demo 140 degrees of shoulder flexion PROM Baseline:  Goal status: MET  LONG TERM GOALS: Target date: 10/26/2023    Pt will be independent with advanced HEP Baseline:  Goal status: INITIAL  2.  Pt will improve QuickDash to <= 46 to demo improved functional mobility Baseline:  Goal status: INITIAL  3.  Pt will demo 4+/5 Lt shoulder strength to improve functional mobility Baseline:  Goal status: INITIAL  4.  Pt will return to exercise with pain <= 2/10 Baseline:  Goal status: INITIAL  5.  Pt will improve internal rotation to be able to fasten bra without difficulty Baseline:  Goal status: INITIAL    PLAN:  PT FREQUENCY: 1-2x/week  PT DURATION: 8 weeks  PLANNED INTERVENTIONS: 97164- PT Re-evaluation, 97110-Therapeutic exercises, 97530- Therapeutic activity, [id number]- Neuromuscular re-education, 97535- Self Care, 02859- Manual therapy, [id number]- Aquatic Therapy, H9716- Electrical stimulation (unattended), 97016- Vasopneumatic device, [id number]- Ionotophoresis 4mg /ml Dexamethasone , Patient/Family education, Taping, Cryotherapy, and Moist heat  PLAN FOR NEXT SESSION: PROGRESS PROTOCOL TO NEXT  PHASE! progress per protocol   Sydnie Sigmund, PT 10/12/2023, 2:45 PM

## 2023-10-17 ENCOUNTER — Other Ambulatory Visit: Payer: Self-pay | Admitting: Family Medicine

## 2023-10-17 ENCOUNTER — Other Ambulatory Visit (HOSPITAL_COMMUNITY): Payer: Self-pay

## 2023-10-17 DIAGNOSIS — I7300 Raynaud's syndrome without gangrene: Secondary | ICD-10-CM

## 2023-10-18 ENCOUNTER — Encounter: Payer: Self-pay | Admitting: Family Medicine

## 2023-10-18 ENCOUNTER — Other Ambulatory Visit (HOSPITAL_COMMUNITY): Payer: Self-pay

## 2023-10-18 ENCOUNTER — Ambulatory Visit (INDEPENDENT_AMBULATORY_CARE_PROVIDER_SITE_OTHER): Admitting: Family Medicine

## 2023-10-18 ENCOUNTER — Other Ambulatory Visit: Payer: Self-pay

## 2023-10-18 VITALS — BP 94/70 | HR 96 | Resp 17 | Ht 63.0 in | Wt 137.0 lb

## 2023-10-18 DIAGNOSIS — F330 Major depressive disorder, recurrent, mild: Secondary | ICD-10-CM | POA: Diagnosis not present

## 2023-10-18 DIAGNOSIS — F5101 Primary insomnia: Secondary | ICD-10-CM

## 2023-10-18 DIAGNOSIS — R635 Abnormal weight gain: Secondary | ICD-10-CM

## 2023-10-18 DIAGNOSIS — F902 Attention-deficit hyperactivity disorder, combined type: Secondary | ICD-10-CM

## 2023-10-18 DIAGNOSIS — R61 Generalized hyperhidrosis: Secondary | ICD-10-CM | POA: Insufficient documentation

## 2023-10-18 MED ORDER — LISDEXAMFETAMINE DIMESYLATE 50 MG PO CAPS
50.0000 mg | ORAL_CAPSULE | Freq: Every day | ORAL | 0 refills | Status: DC
  Filled 2024-01-18: qty 90, 90d supply, fill #0

## 2023-10-18 NOTE — Progress Notes (Unsigned)
 Established Patient Office Visit  Subjective  Patient ID: April Webster, female    DOB: 1976-03-01  Age: 48 y.o. MRN: [id number]  Chief Complaint  Patient presents with   Medical Management of Chronic Issues    HPI she is here today to discuss a couple of concerns.  Blood pressures are still going up and down in fact last night blood pressure was in the 160s towards the end of work and she did not feel overly stressed at the time and then this morning got up and here this morning her blood pressure was 94/70 so she still taking the amlodipine  and tolerating it well without any side effects.  But still cannot explain the shifts in blood pressure she is not taking anything that would trigger it.  She still not sleeping great.  She is not able to get a full 7 to 8 hours of sleep each night except for on the weekends where she tends to sleep 14 hours or.  We had recently started venlafaxine  to help with night sweats and hot flashes she says that after we went up to the 150 mg it really did make a big difference she was only getting an episode every couple of weeks.  But since starting the medication she has gained about 10 to 12 pounds.  She recently had shoulder surgery but has been trying to stay active and is getting back into some form of an exercise routine.  She is even changed her diet by trying to cut back on portions etc. but just not really making a lot of improvement.  She was previously on Lyrica  for cervical radiculopathy but ran out a couple of weeks ago and has come off of it and really has not noticed a big difference in taking it versus not taking it.  He is interested in potentially doing [employer] testing to look at psychiatric drugs that theoretically she would do better with.  Not sure if covered by insurance.  She finally got a call for the sleep study to get scheduled.  ADD-she has been doing really well with the Vyvanse  50 mg happy with the current regimen she feels like it  definitely is helping her focus more but does notice a little decrease in fine motor coordination in her fingers on days where she takes it versus not taking it.  {History (Optional):23778}  ROS    Objective:     BP 94/70   Pulse 96   Resp 17   Ht 5' 3 (1.6 m)   Wt 137 lb (62.1 kg)   SpO2 98%   BMI 24.27 kg/m  {Vitals History (Optional):23777}  Physical Exam Vitals reviewed.  Constitutional:      Appearance: Normal appearance.  HENT:     Head: Normocephalic.  Pulmonary:     Effort: Pulmonary effort is normal.  Neurological:     Mental Status: She is alert and oriented to person, place, and time.  Psychiatric:        Mood and Affect: Mood normal.        Behavior: Behavior normal.      No results found for any visits on 10/18/23.  {Labs (Optional):23779}  The 10-year ASCVD risk score (Arnett DK, et al., 2019) is: 0.3%    Assessment & Plan:   Problem List Items Addressed This Visit       Other   Night sweats   MDD (major depressive disorder), recurrent episode (HCC)   Attention deficit hyperactivity disorder (  ADHD), combined type - Primary   Continue with Vyvanse  50 mg.  Will make sure updated prescriptions are sent to pharmacy.      Relevant Medications   lisdexamfetamine (VYVANSE ) 50 MG capsule (Start on 12/14/2023)   Other Visit Diagnoses       Weight gain          Weight gain-likely secondary to recent use of SSRI and somewhat diminished activity for a brief period of time around shoulder surgery though again she is getting back into exercising again.  Will order [employer]  Schedule for sleep study now.    No follow-ups on file.    Dorothyann Byars, MD

## 2023-10-18 NOTE — Assessment & Plan Note (Signed)
 Happy w/ current regimen.  Continue with Vyvanse  50 mg.  Will make sure updated prescriptions are sent to pharmacy.

## 2023-10-19 ENCOUNTER — Other Ambulatory Visit: Payer: Self-pay

## 2023-10-19 ENCOUNTER — Encounter: Payer: Self-pay | Admitting: Physical Therapy

## 2023-10-19 ENCOUNTER — Other Ambulatory Visit (HOSPITAL_COMMUNITY): Payer: Self-pay

## 2023-10-19 ENCOUNTER — Ambulatory Visit: Attending: Orthopaedic Surgery | Admitting: Physical Therapy

## 2023-10-19 DIAGNOSIS — M6281 Muscle weakness (generalized): Secondary | ICD-10-CM | POA: Diagnosis not present

## 2023-10-19 DIAGNOSIS — M25512 Pain in left shoulder: Secondary | ICD-10-CM | POA: Insufficient documentation

## 2023-10-19 MED ORDER — AMLODIPINE BESYLATE 2.5 MG PO TABS
2.5000 mg | ORAL_TABLET | Freq: Every day | ORAL | 1 refills | Status: DC
  Filled 2023-10-19: qty 90, 90d supply, fill #0
  Filled 2024-01-18: qty 90, 90d supply, fill #1

## 2023-10-19 MED ORDER — HYDROCHLOROTHIAZIDE 25 MG PO TABS
25.0000 mg | ORAL_TABLET | Freq: Every day | ORAL | 0 refills | Status: DC | PRN
  Filled 2023-10-19: qty 90, 90d supply, fill #0

## 2023-10-19 MED ORDER — DESVENLAFAXINE SUCCINATE ER 50 MG PO TB24
50.0000 mg | ORAL_TABLET | Freq: Every day | ORAL | 1 refills | Status: DC
  Filled 2023-10-19: qty 30, 30d supply, fill #0
  Filled 2023-11-13: qty 30, 30d supply, fill #1

## 2023-10-19 NOTE — Assessment & Plan Note (Signed)
 I did check the data and not good stats on weight gain of Effexor  vs Pristiq  so we can try changing med for next 6 weeks and see if any difference in weight gain but still helping with hot flashes.

## 2023-10-19 NOTE — Assessment & Plan Note (Signed)
 Interested in [employer]. Will see if can order.  She has met her deductible for the year.

## 2023-10-19 NOTE — Therapy (Signed)
 OUTPATIENT PHYSICAL THERAPY SHOULDER TREATMENT   Patient Name: April Webster MRN: [id number] DOB:January 19, 1976, 48 y.o., female Today's Date: 10/19/2023  END OF SESSION:  PT End of Session - 10/19/23 1358     Visit Number 8    Number of Visits 16    Date for PT Re-Evaluation 10/26/23    Authorization Type Aetna    PT Start Time 1315    PT Stop Time 1358    PT Time Calculation (min) 43 min    Activity Tolerance Patient tolerated treatment well    Behavior During Therapy WFL for tasks assessed/performed                Past Medical History:  Diagnosis Date   Chronic idiopathic constipation    DDD (degenerative disc disease), cervical and lumbar    Female pattern hair loss    Graves disease    Hyperlipidemia    Hypertension    Hypothyroidism    Lichen sclerosus    Raynaud's disease    Past Surgical History:  Procedure Laterality Date   ABDOMINOPLASTY     AUGMENTATION MAMMAPLASTY Bilateral 05/2019   Silicone    CHOLECYSTECTOMY     DILITATION & CURRETTAGE/HYSTROSCOPY WITH NOVASURE ABLATION N/A 02/09/2022   Procedure: DILATATION & CURETTAGE/HYSTEROSCOPY WITH NOVASURE ABLATION, REMOVAL OF IUD;  Surgeon: Cleatus Moccasin, MD;  Location: [facility];  Service: Gynecology;  Laterality: N/A;   PLACEMENT OF BREAST IMPLANTS     POSTERIOR LUMBAR FUSION 2 WITH HARDWARE REMOVAL Left 08/25/2023   Procedure: ARTHROSCOPY, SHOULDER WITH DEBRIDEMENT, BIOTENDONESIS REPAIR;  Surgeon: Cristy Bonner DASEN, MD;  Location: [facility];  Service: Orthopedics;  Laterality: Left;   THYROID  SURGERY     TUBAL LIGATION     Patient Active Problem List   Diagnosis Date Noted   Night sweats 10/18/2023   Chronic neck pain 04/13/2023   Recurrent infections 03/02/2023   Chronic left shoulder pain 12/21/2022   Attention deficit hyperactivity disorder (ADHD), combined type 07/20/2022   Primary insomnia 09/09/2021   Chronic pain of right knee 08/12/2021   LGSIL on Pap smear of cervix 07/01/2020    Raynaud's disease 02/18/2020   IC (interstitial cystitis) 02/18/2020   Chronic constipation 02/18/2020   DDD (degenerative disc disease), cervical 12/14/2019   Gastroesophageal reflux disease 05/16/2019   Hypothyroidism 03/19/2019   Hyperlipidemia 03/19/2019   Facet syndrome, lumbar 03/19/2019   Female pattern hair loss 03/19/2019   Lichen sclerosus 03/19/2019   Upper airway cough syndrome 03/19/2019   IUD (intrauterine device) in place 12/06/2016   MDD (major depressive disorder), recurrent episode (HCC) 05/10/2011    PCP: Alvan  REFERRING PROVIDER: Cristy  REFERRING DIAG: Lt Biceps Tenodesis with clavicle resection and acromial decompression  THERAPY DIAG:  Acute pain of left shoulder  Muscle weakness (generalized)  Rationale for Evaluation and Treatment: Rehabilitation  ONSET DATE: 08/25/23  SUBJECTIVE:  SUBJECTIVE STATEMENT: Pt states she tweaked her shoulder when drying off after showering. She has soreness on superior Lt shoulder   Eval: Pt is s/p Lt biceps tenodesis with clavicle resection and acromial decompression. She has been using tylenol  and ice for pain control. She has been sleeping in the recliner and has been sleeping well. Hand dominance: Right  PERTINENT HISTORY: DDD, graves disease  PAIN:  Are you having pain? Yes: NPRS scale: 2/10 currently, 8/10 at worst Pain location: Lt shoulder Pain description: spasms Aggravating factors: end of day Relieving factors: ice, meds  PRECAUTIONS: Shoulder - see protocol   WEIGHT BEARING RESTRICTIONS: NWB Lt UE  FALLS:  Has patient fallen in last 6 months? 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    PATIENT SURVEYS:  Quick Dash 56.8     SENSATION: Numbness/tingling in Lt hand with full elbow flexion  POSTURE: Pt wearing sling on Lt UE  UPPER EXTREMITY ROM:   Passive ROM Right eval Left eval Left 09/21/23  Shoulder flexion  84 151  Shoulder extension     Shoulder abduction  Not assessed due to protocol   Shoulder adduction     Shoulder internal rotation  45   Shoulder external rotation  21 44  Elbow flexion     Elbow extension     Wrist flexion     Wrist extension     Wrist ulnar deviation     Wrist radial deviation     Wrist pronation     Wrist supination     (Blank rows = not tested)  UPPER EXTREMITY MMT: Not assessed due to surgery MMT Right eval Left eval  Shoulder flexion    Shoulder extension    Shoulder abduction    Shoulder adduction    Shoulder internal rotation    Shoulder external rotation    Middle trapezius    Lower trapezius    Elbow flexion    Elbow extension    Wrist flexion    Wrist extension    Wrist ulnar deviation    Wrist radial deviation    Wrist pronation    Wrist supination    Grip strength (lbs)    (Blank rows = not tested)   PALPATION:  Stitches intact Lt shoulder   [facility] Adult PT Treatment:                                                DATE: 10/19/23 Therapeutic Exercise: IR red TB x 10 ER red TB x 10 Flexion to 90 3# x 10 Abduction to 90 3#--> 2# x 10 Scaption 3# x 10 Row green TB x 10 Extension green TB x 10 Manual Therapy: STM and TPR Lt serratus, subscap, UT  Modalities: Vaso x 10 min Lt shoulder low pressure 34 degrees   [facility] Adult PT Treatment:                                                DATE: 10/12/23 Therapeutic Exercise: IR stretch with strap 10 x  5 sec hold ER doorway stretch Standing serratus punch red TB 2 x 10 Reactive isometrics green TB x 10 Flex Ext Abd IR (red TB) ER (red TB) Manual Therapy: STM Lt shoulder  musculature TPR serratus  Modalities: Vaso x 10 min Lt shoulder low pressure 34 degrees   [facility] Adult PT Treatment:                                                DATE: 10/05/23 Therapeutic Exercise: Reactive isometrics green TB x 10 Flex Ext ER (red TB) IR (red TB) Bicep curl 3# 2 x 10 Pronation/supination 3# 2 x 10 IR stretch with strap 10 x 5 sec hold ER doorway stretch Manual Therapy: STM Lt shoulder musculature TPR serratus  Modalities: Vaso x 10 min Lt shoulder low pressure 34 degrees     PATIENT EDUCATION: Education details: PT POC and goals, HEP, protocol, precautions Person educated: Patient Education method: Explanation, Demonstration, and Handouts Education comprehension: verbalized understanding and returned demonstration  HOME EXERCISE PROGRAM: Access Code: [email or web address] URL: [email or web address] Date: 10/19/2023 Prepared by: Darice Conine  Exercises - Standing Infraspinatus/Teres Minor Release with [place] [facility]  - 1 x daily - 7 x weekly - 1 sets - 1 reps - 2-3 minutes hold - Standing Bicep Curls Supinated with Dumbbells  - 1 x daily - 7 x weekly - 3 sets - 10 reps - Standing Shoulder Internal Rotation Stretch with Towel  - 1 x daily - 7 x weekly - 1 sets - 10 reps - 10 seconds hold - Standing Shoulder Flexion to 90 Degrees with Dumbbells  - 1 x daily - 7 x weekly - 3 sets - 10 reps - Standing Shoulder Horizontal Abduction with Resistance  - 1 x daily - 7 x weekly - 3 sets - 10 reps - Scaption with Dumbbells  - 1 x daily - 7 x weekly - 3 sets - 10 reps - Standing Bilateral Low Shoulder Row with Anchored Resistance  - 1 x daily - 7 x weekly - 3 sets - 10 reps - Shoulder extension with resistance - Neutral  - 1 x daily - 7 x weekly - 3 sets - 10 reps  ASSESSMENT:  CLINICAL IMPRESSION: Pt with good tolerance to progression of exercises. Slight pain with abduction but pain decreased with decreased weight. Pt with more soreness today and responds  well to  manual work. HEP updated  Eval: Patient is a 48 y.o. female who was seen today for physical therapy evaluation and treatment for Lt bicep tenodesis with distal clavicle resection and acromial decompression. She presents with decreased strength, ROM and functional use and increased pain. She will benefit from skilled PT to address deficits and improve functional mobility  and independence.   OBJECTIVE IMPAIRMENTS: decreased activity tolerance, decreased ROM, decreased strength, impaired UE functional use, and pain.    GOALS: Goals reviewed with patient? Yes  SHORT TERM GOALS: Target date: 09/28/2023    Pt will be independent with initial HEP Baseline: Goal status: MET  2.  Pt will demo 140 degrees of shoulder flexion PROM Baseline:  Goal status: MET    LONG TERM GOALS: Target date: 10/26/2023    Pt will be independent with advanced HEP Baseline:  Goal status: INITIAL  2.  Pt will improve QuickDash to <= 46 to demo improved functional mobility Baseline:  Goal status: INITIAL  3.  Pt will demo 4+/5 Lt shoulder strength to improve functional mobility Baseline:  Goal status: INITIAL  4.  Pt will return to exercise with pain <= 2/10 Baseline:  Goal status: INITIAL  5.  Pt will improve internal rotation to be able to fasten bra without difficulty Baseline:  Goal status: INITIAL    PLAN:  PT FREQUENCY: 1-2x/week  PT DURATION: 8 weeks  PLANNED INTERVENTIONS: 97164- PT Re-evaluation, 97110-Therapeutic exercises, 97530- Therapeutic activity, [id number]- Neuromuscular re-education, 97535- Self Care, 02859- Manual therapy, [id number]- Aquatic Therapy, H9716- Electrical stimulation (unattended), 97016- Vasopneumatic device, [id number]- Ionotophoresis 4mg /ml Dexamethasone , Patient/Family education, Taping, Cryotherapy, and Moist heat  PLAN FOR NEXT SESSION: progress per protocol   Mitul Hallowell, PT 10/19/2023, 1:58 PM

## 2023-10-19 NOTE — Assessment & Plan Note (Signed)
Sleep study to be scheduled

## 2023-10-22 ENCOUNTER — Other Ambulatory Visit: Payer: Self-pay | Admitting: Family Medicine

## 2023-10-22 DIAGNOSIS — F5101 Primary insomnia: Secondary | ICD-10-CM

## 2023-10-24 ENCOUNTER — Other Ambulatory Visit (HOSPITAL_COMMUNITY): Payer: Self-pay

## 2023-10-24 MED ORDER — TRAZODONE HCL 50 MG PO TABS
25.0000 mg | ORAL_TABLET | Freq: Every day | ORAL | 3 refills | Status: DC
  Filled 2023-10-24: qty 60, 30d supply, fill #0
  Filled 2023-12-23: qty 60, 30d supply, fill #1
  Filled 2024-01-18: qty 60, 30d supply, fill #2
  Filled 2024-03-12: qty 60, 30d supply, fill #3

## 2023-10-25 ENCOUNTER — Other Ambulatory Visit: Payer: Self-pay

## 2023-10-25 ENCOUNTER — Ambulatory Visit (INDEPENDENT_AMBULATORY_CARE_PROVIDER_SITE_OTHER): Admitting: Sports Medicine

## 2023-10-25 ENCOUNTER — Other Ambulatory Visit (HOSPITAL_COMMUNITY): Payer: Self-pay

## 2023-10-25 ENCOUNTER — Encounter: Payer: Self-pay | Admitting: Sports Medicine

## 2023-10-25 DIAGNOSIS — M5030 Other cervical disc degeneration, unspecified cervical region: Secondary | ICD-10-CM

## 2023-10-25 MED ORDER — TRIAZOLAM 0.25 MG PO TABS
ORAL_TABLET | ORAL | 0 refills | Status: DC
  Filled 2023-10-25: qty 2, 1d supply, fill #0

## 2023-10-25 NOTE — Progress Notes (Signed)
    Procedures performed today:    None.  Independent interpretation of notes and tests performed by another provider:   None.  Brief History, Exam, Impression, and Recommendations:    DDD (degenerative disc disease), cervical Very pleasant 48 year old female with cervical DDD, worse C5-C6, facets looked okay. She had cervical epidural a couple years ago that seem to work really well, having recurrent pain, ordering repeat cervical epidural, bilateral C6-C7.    ____________________________________________ Debby PARAS. Curtis, M.D., ABFM., CAQSM., AME. Primary Care and Sports Medicine [facility]  Adjunct Professor of [employer]  [employer]  Restaurant manager, fast food

## 2023-10-25 NOTE — Assessment & Plan Note (Signed)
 Very pleasant 48 year old female with cervical DDD, worse C5-C6, facets looked okay. She had cervical epidural a couple years ago that seem to work really well, having recurrent pain, ordering repeat cervical epidural, bilateral C6-C7.

## 2023-10-26 ENCOUNTER — Ambulatory Visit: Admitting: Physical Therapy

## 2023-10-26 ENCOUNTER — Encounter: Payer: Self-pay | Admitting: Physical Therapy

## 2023-10-26 DIAGNOSIS — M6281 Muscle weakness (generalized): Secondary | ICD-10-CM

## 2023-10-26 DIAGNOSIS — M25512 Pain in left shoulder: Secondary | ICD-10-CM

## 2023-10-26 NOTE — Therapy (Signed)
 OUTPATIENT PHYSICAL THERAPY SHOULDER TREATMENT AND RECERTIFICATION   Patient Name: April Webster MRN: [id number] DOB:December 19, 1975, 48 y.o., female Today's Date: 10/26/2023  END OF SESSION:  PT End of Session - 10/26/23 1621     Visit Number 9    Number of Visits 21    Date for PT Re-Evaluation 12/07/23    PT Start Time 1315    PT Stop Time 1400    PT Time Calculation (min) 45 min    Activity Tolerance Patient tolerated treatment well    Behavior During Therapy WFL for tasks assessed/performed                 Past Medical History:  Diagnosis Date   Allergy    Arthritis    Chronic idiopathic constipation    DDD (degenerative disc disease), cervical and lumbar    Female pattern hair loss    Graves disease    Hyperlipidemia    Hypertension    Hypothyroidism    Lichen sclerosus    Raynaud's disease    Past Surgical History:  Procedure Laterality Date   ABDOMINOPLASTY     AUGMENTATION MAMMAPLASTY Bilateral 05/2019   Silicone    CESAREAN SECTION     CHOLECYSTECTOMY     COSMETIC SURGERY     DILITATION & CURRETTAGE/HYSTROSCOPY WITH NOVASURE ABLATION N/A 02/09/2022   Procedure: DILATATION & CURETTAGE/HYSTEROSCOPY WITH NOVASURE ABLATION, REMOVAL OF IUD;  Surgeon: Cleatus Moccasin, MD;  Location: [facility];  Service: Gynecology;  Laterality: N/A;   PLACEMENT OF BREAST IMPLANTS     POSTERIOR LUMBAR FUSION 2 WITH HARDWARE REMOVAL Left 08/25/2023   Procedure: ARTHROSCOPY, SHOULDER WITH DEBRIDEMENT, BIOTENDONESIS REPAIR;  Surgeon: Cristy Bonner DASEN, MD;  Location: [facility];  Service: Orthopedics;  Laterality: Left;   THYROID  SURGERY     TUBAL LIGATION     Patient Active Problem List   Diagnosis Date Noted   Night sweats 10/18/2023   Chronic neck pain 04/13/2023   Recurrent infections 03/02/2023   Chronic left shoulder pain 12/21/2022   Attention deficit hyperactivity disorder (ADHD), combined type 07/20/2022   Primary insomnia 09/09/2021   Chronic pain of right  knee 08/12/2021   LGSIL on Pap smear of cervix 07/01/2020   Raynaud's disease 02/18/2020   IC (interstitial cystitis) 02/18/2020   Chronic constipation 02/18/2020   DDD (degenerative disc disease), cervical 12/14/2019   Gastroesophageal reflux disease 05/16/2019   Hypothyroidism 03/19/2019   Hyperlipidemia 03/19/2019   Facet syndrome, lumbar 03/19/2019   Female pattern hair loss 03/19/2019   Lichen sclerosus 03/19/2019   Upper airway cough syndrome 03/19/2019   IUD (intrauterine device) in place 12/06/2016   MDD (major depressive disorder), recurrent episode (HCC) 05/10/2011    PCP: Alvan  REFERRING PROVIDER: Cristy  REFERRING DIAG: Lt Biceps Tenodesis with clavicle resection and acromial decompression  THERAPY DIAG:  Acute pain of left shoulder  Muscle weakness (generalized)  Rationale for Evaluation and Treatment: Rehabilitation  ONSET DATE: 08/25/23  SUBJECTIVE:  SUBJECTIVE STATEMENT: Pt states she tweaked her shoulder while brushing her dog and it hurt for a few days but is feeling mostly better now   Eval: Pt is s/p Lt biceps tenodesis with clavicle resection and acromial decompression. She has been using tylenol  and ice for pain control. She has been sleeping in the recliner and has been sleeping well. Hand dominance: Right  PERTINENT HISTORY: DDD, graves disease  PAIN:  Are you having pain? Yes: NPRS scale: 2/10 currently, 8/10 at worst Pain location: Lt shoulder Pain description: spasms Aggravating factors: end of day Relieving factors: ice, meds  PRECAUTIONS: Shoulder - see protocol   WEIGHT BEARING RESTRICTIONS: NWB Lt UE  FALLS:  Has patient fallen in last 6 months? 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    PATIENT SURVEYS:  Quick Dash 56.8     SENSATION: Numbness/tingling in Lt hand with full elbow flexion  POSTURE: Pt wearing sling on Lt UE  UPPER EXTREMITY ROM:   Passive ROM Right eval Left eval Left 09/21/23  Shoulder flexion  84 151  Shoulder extension     Shoulder abduction  Not assessed due to protocol   Shoulder adduction     Shoulder internal rotation  45   Shoulder external rotation  21 44  Elbow flexion     Elbow extension     Wrist flexion     Wrist extension     Wrist ulnar deviation     Wrist radial deviation     Wrist pronation     Wrist supination     (Blank rows = not tested)  UPPER EXTREMITY MMT: Not assessed due to surgery MMT Right eval Left eval  Shoulder flexion    Shoulder extension    Shoulder abduction    Shoulder adduction    Shoulder internal rotation    Shoulder external rotation    Middle trapezius    Lower trapezius    Elbow flexion    Elbow extension    Wrist flexion    Wrist extension    Wrist ulnar deviation    Wrist radial deviation    Wrist pronation    Wrist supination    Grip strength (lbs)    (Blank rows = not tested)   PALPATION:  Stitches intact Lt shoulder   [facility] Adult PT Treatment:                                                DATE: 10/26/23 Therapeutic Exercise/Activity: Row green TB 2 x 10 Extension green TB 2 x 10 ER red TB 2 x 10 IR red TB 2 x 10 Serratus wall slide x 10 Scaption 3# x 10 Abduction 3# x 10 Flexion 3# x 10 Supine serratus punch 3# x 10 Manual Therapy: STM and TPR Lt serratus, subscap, UT   [facility] Adult PT Treatment:                                                DATE: 10/19/23 Therapeutic Exercise: IR red TB  x 10 ER red TB x 10 Flexion to 90 3# x 10 Abduction to 90 3#--> 2# x 10 Scaption 3# x 10 Row green TB x 10 Extension green TB x 10 Manual  Therapy: STM and TPR Lt serratus, subscap, UT  Modalities: Vaso x 10 min Lt shoulder low pressure 34 degrees   [facility] Adult PT Treatment:                                                DATE: 10/12/23 Therapeutic Exercise: IR stretch with strap 10 x 5 sec hold ER doorway stretch Standing serratus punch red TB 2 x 10 Reactive isometrics green TB x 10 Flex Ext Abd IR (red TB) ER (red TB) Manual Therapy: STM Lt shoulder musculature TPR serratus  Modalities: Vaso x 10 min Lt shoulder low pressure 34 degrees   [facility] Adult PT Treatment:                                                DATE: 10/05/23 Therapeutic Exercise: Reactive isometrics green TB x 10 Flex Ext ER (red TB) IR (red TB) Bicep curl 3# 2 x 10 Pronation/supination 3# 2 x 10 IR stretch with strap 10 x 5 sec hold ER doorway stretch Manual Therapy: STM Lt shoulder musculature TPR serratus  Modalities: Vaso x 10 min Lt shoulder low pressure 34 degrees     PATIENT EDUCATION: Education details: PT POC and goals, HEP, protocol, precautions Person educated: Patient Education method: Explanation, Demonstration, and Handouts Education comprehension: verbalized understanding and returned demonstration  HOME EXERCISE PROGRAM: Access Code: [email or web address] URL: [email or web address] Date: 10/19/2023 Prepared by: Darice Conine  Exercises - Standing Infraspinatus/Teres Minor Release with [place] at [facility]  - 1 x daily - 7 x weekly - 1 sets - 1 reps - 2-3 minutes hold - Standing Bicep Curls Supinated with Dumbbells  - 1 x daily - 7 x weekly - 3 sets - 10 reps - Standing Shoulder Internal Rotation Stretch with Towel  - 1 x daily - 7 x weekly - 1 sets - 10 reps - 10 seconds hold - Standing Shoulder Flexion to 90 Degrees with Dumbbells  - 1 x daily - 7 x weekly - 3 sets - 10 reps - Standing Shoulder Horizontal Abduction with Resistance  - 1 x daily - 7 x weekly - 3 sets - 10 reps - Scaption with Dumbbells  - 1 x daily  - 7 x weekly - 3 sets - 10 reps - Standing Bilateral Low Shoulder Row with Anchored Resistance  - 1 x daily - 7 x weekly - 3 sets - 10 reps - Shoulder extension with resistance - Neutral  - 1 x daily - 7 x weekly - 3 sets - 10 reps  ASSESSMENT:  CLINICAL IMPRESSION: Pt with good tolerance to progression of exercises. Able to increase resistance without increase in pain. Pt will continue to benefit from skilled PT to address deficits and progress towards long term goals  Eval: Patient is a 48 y.o. female who was seen today for physical therapy evaluation and treatment for Lt bicep tenodesis with distal clavicle resection and acromial decompression. She presents with decreased strength, ROM and functional use and increased pain. She will  benefit from skilled PT to address deficits and improve functional mobility  and independence.   OBJECTIVE IMPAIRMENTS: decreased activity tolerance, decreased ROM, decreased strength, impaired UE functional use, and pain.    GOALS: Goals reviewed with patient? Yes  SHORT TERM GOALS: Target date: 09/28/2023    Pt will be independent with initial HEP Baseline: Goal status: MET  2.  Pt will demo 140 degrees of shoulder flexion PROM Baseline:  Goal status: MET    LONG TERM GOALS: Target date: 12/07/2023      Pt will be independent with advanced HEP Baseline:  Goal status: IN PROGRESS  2.  Pt will improve QuickDash to <= 46 to demo improved functional mobility Baseline:  Goal status: IN PROGRESS  3.  Pt will demo 4+/5 Lt shoulder strength to improve functional mobility Baseline:  Goal status: IN PROGRESS  4.  Pt will return to exercise with pain <= 2/10 Baseline:  Goal status: IN PROGRESS  5.  Pt will improve internal rotation to be able to fasten bra without difficulty Baseline:  Goal status: IN PROGRESS    PLAN:  PT FREQUENCY: 1-2x/week  PT DURATION: 6 weeks  PLANNED INTERVENTIONS: 97164- PT Re-evaluation, 97110-Therapeutic  exercises, 97530- Therapeutic activity, [id number]- Neuromuscular re-education, 97535- Self Care, 02859- Manual therapy, [id number]- Aquatic Therapy, H9716- Electrical stimulation (unattended), 97016- Vasopneumatic device, [id number]- Ionotophoresis 4mg /ml Dexamethasone , Patient/Family education, Taping, Cryotherapy, and Moist heat  PLAN FOR NEXT SESSION: progress per protocol   Gwendolyne Welford, PT 10/26/2023, 4:22 PM

## 2023-10-27 ENCOUNTER — Telehealth: Payer: Self-pay | Admitting: Sports Medicine

## 2023-10-27 ENCOUNTER — Other Ambulatory Visit: Payer: Self-pay | Admitting: Family Medicine

## 2023-10-27 ENCOUNTER — Other Ambulatory Visit (HOSPITAL_COMMUNITY): Payer: Self-pay

## 2023-10-27 ENCOUNTER — Other Ambulatory Visit: Payer: Self-pay

## 2023-10-27 DIAGNOSIS — R635 Abnormal weight gain: Secondary | ICD-10-CM | POA: Insufficient documentation

## 2023-10-27 MED ORDER — TIRZEPATIDE 10 MG/0.5ML ~~LOC~~ SOAJ: SUBCUTANEOUS | 11 refills | Status: AC

## 2023-10-27 MED ORDER — MELOXICAM 15 MG PO TABS
15.0000 mg | ORAL_TABLET | Freq: Every day | ORAL | 3 refills | Status: AC
  Filled 2023-10-27: qty 90, 90d supply, fill #0
  Filled 2024-01-18: qty 90, 90d supply, fill #1
  Filled 2024-04-19: qty 90, 90d supply, fill #2

## 2023-10-27 NOTE — Telephone Encounter (Signed)
 April Webster has had some weight gain with her medications, she is hoping to try a GLP1 to help, we will send in compounded tirezepatide.

## 2023-10-27 NOTE — Assessment & Plan Note (Signed)
 April Webster has had some weight gain with her medications, she is hoping to try a GLP1 to help, we will send in compounded tirezepatide.

## 2023-11-02 ENCOUNTER — Encounter: Payer: Self-pay | Admitting: Physical Therapy

## 2023-11-02 ENCOUNTER — Ambulatory Visit: Admitting: Physical Therapy

## 2023-11-02 DIAGNOSIS — M25512 Pain in left shoulder: Secondary | ICD-10-CM

## 2023-11-02 DIAGNOSIS — M6281 Muscle weakness (generalized): Secondary | ICD-10-CM | POA: Diagnosis not present

## 2023-11-02 NOTE — Therapy (Signed)
 OUTPATIENT PHYSICAL THERAPY SHOULDER TREATMENT   Patient Name: April Webster MRN: [id number] DOB:11-Feb-1976, 48 y.o., female Today's Date: 11/02/2023  END OF SESSION:  PT End of Session - 11/02/23 1402     Visit Number 10    Number of Visits 21    Date for PT Re-Evaluation 12/07/23    Authorization Type [employer]    PT Start Time 1315    PT Stop Time 1354    PT Time Calculation (min) 39 min    Activity Tolerance Patient tolerated treatment well    Behavior During Therapy WFL for tasks assessed/performed                  Past Medical History:  Diagnosis Date   Allergy    Arthritis    Chronic idiopathic constipation    DDD (degenerative disc disease), cervical and lumbar    Female pattern hair loss    Graves disease    Hyperlipidemia    Hypertension    Hypothyroidism    Lichen sclerosus    Raynaud's disease    Past Surgical History:  Procedure Laterality Date   ABDOMINOPLASTY     AUGMENTATION MAMMAPLASTY Bilateral 05/2019   Silicone    CESAREAN SECTION     CHOLECYSTECTOMY     COSMETIC SURGERY     DILITATION & CURRETTAGE/HYSTROSCOPY WITH NOVASURE ABLATION N/A 02/09/2022   Procedure: DILATATION & CURETTAGE/HYSTEROSCOPY WITH NOVASURE ABLATION, REMOVAL OF IUD;  Surgeon: Cleatus Moccasin, MD;  Location: [facility];  Service: Gynecology;  Laterality: N/A;   PLACEMENT OF BREAST IMPLANTS     POSTERIOR LUMBAR FUSION 2 WITH HARDWARE REMOVAL Left 08/25/2023   Procedure: ARTHROSCOPY, SHOULDER WITH DEBRIDEMENT, BIOTENDONESIS REPAIR;  Surgeon: Cristy Bonner DASEN, MD;  Location: [facility];  Service: Orthopedics;  Laterality: Left;   THYROID  SURGERY     TUBAL LIGATION     Patient Active Problem List   Diagnosis Date Noted   Abnormal weight gain 10/27/2023   Night sweats 10/18/2023   Chronic neck pain 04/13/2023   Recurrent infections 03/02/2023   Chronic left shoulder pain 12/21/2022   Attention deficit hyperactivity disorder (ADHD), combined type 07/20/2022    Primary insomnia 09/09/2021   Chronic pain of right knee 08/12/2021   LGSIL on Pap smear of cervix 07/01/2020   Raynaud's disease 02/18/2020   IC (interstitial cystitis) 02/18/2020   Chronic constipation 02/18/2020   DDD (degenerative disc disease), cervical 12/14/2019   Gastroesophageal reflux disease 05/16/2019   Hypothyroidism 03/19/2019   Hyperlipidemia 03/19/2019   Facet syndrome, lumbar 03/19/2019   Female pattern hair loss 03/19/2019   Lichen sclerosus 03/19/2019   Upper airway cough syndrome 03/19/2019   IUD (intrauterine device) in place 12/06/2016   MDD (major depressive disorder), recurrent episode (HCC) 05/10/2011    PCP: Alvan  REFERRING PROVIDER: Cristy  REFERRING DIAG: Lt Biceps Tenodesis with clavicle resection and acromial decompression  THERAPY DIAG:  Acute pain of left shoulder  Muscle weakness (generalized)  Rationale for Evaluation and Treatment: Rehabilitation  ONSET DATE: 08/25/23  SUBJECTIVE:  SUBJECTIVE STATEMENT: Pt with no new complaints   Eval: Pt is s/p Lt biceps tenodesis with clavicle resection and acromial decompression. She has been using tylenol  and ice for pain control. She has been sleeping in the recliner and has been sleeping well. Hand dominance: Right  PERTINENT HISTORY: DDD, graves disease  PAIN:  Are you having pain? Yes: NPRS scale: 2/10 currently, 8/10 at worst Pain location: Lt shoulder Pain description: spasms Aggravating factors: end of day Relieving factors: ice, meds  PRECAUTIONS: Shoulder - see protocol   WEIGHT BEARING RESTRICTIONS: NWB Lt UE  FALLS:  Has patient fallen in last 6 months? 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    PATIENT SURVEYS:  Quick Dash 56.8     SENSATION: Numbness/tingling in Lt hand with full elbow flexion  POSTURE: Pt wearing sling on Lt UE  UPPER EXTREMITY ROM:   Passive ROM Right eval Left eval Left 09/21/23  Shoulder flexion  84 151  Shoulder extension     Shoulder abduction  Not assessed due to protocol   Shoulder adduction     Shoulder internal rotation  45   Shoulder external rotation  21 44  Elbow flexion     Elbow extension     Wrist flexion     Wrist extension     Wrist ulnar deviation     Wrist radial deviation     Wrist pronation     Wrist supination     (Blank rows = not tested)  UPPER EXTREMITY MMT: Not assessed due to surgery MMT Right eval Left eval  Shoulder flexion    Shoulder extension    Shoulder abduction    Shoulder adduction    Shoulder internal rotation    Shoulder external rotation    Middle trapezius    Lower trapezius    Elbow flexion    Elbow extension    Wrist flexion    Wrist extension    Wrist ulnar deviation    Wrist radial deviation    Wrist pronation    Wrist supination    Grip strength (lbs)    (Blank rows = not tested)   PALPATION:  Stitches intact Lt shoulder   [facility] Adult PT Treatment:                                                DATE: 11/02/23 Therapeutic Exercise/Activity: Prone T 2# x 10 Attempt Y - pt only able to complete 2 - limited by pain Standing Serratus wall slide Scap clock red TB Y,T,I red TB 90/90 ER red TB Manual Therapy: TPR and STM posterior shoulder musculature in prone and supine   [facility] Adult PT Treatment:                                                DATE: 10/26/23 Therapeutic Exercise/Activity: Row green TB 2 x 10 Extension green TB 2 x 10 ER red TB 2 x 10 IR red TB 2 x 10 Serratus wall slide x 10 Scaption 3# x  10 Abduction 3# x 10 Flexion 3# x 10 Supine serratus punch 3# x  10 Manual Therapy: STM and TPR Lt serratus, subscap, UT   [facility] Adult PT Treatment:                                                DATE: 10/19/23 Therapeutic Exercise: IR red TB x 10 ER red TB x 10 Flexion to 90 3# x 10 Abduction to 90 3#--> 2# x 10 Scaption 3# x 10 Row green TB x 10 Extension green TB x 10 Manual Therapy: STM and TPR Lt serratus, subscap, UT  Modalities: Vaso x 10 min Lt shoulder low pressure 34 degrees   [facility] Adult PT Treatment:                                                DATE: 10/12/23 Therapeutic Exercise: IR stretch with strap 10 x 5 sec hold ER doorway stretch Standing serratus punch red TB 2 x 10 Reactive isometrics green TB x 10 Flex Ext Abd IR (red TB) ER (red TB) Manual Therapy: STM Lt shoulder musculature TPR serratus  Modalities: Vaso x 10 min Lt shoulder low pressure 34 degrees    PATIENT EDUCATION: Education details: PT POC and goals, HEP, protocol, precautions Person educated: Patient Education method: Explanation, Demonstration, and Handouts Education comprehension: verbalized understanding and returned demonstration  HOME EXERCISE PROGRAM: Access Code: [email or web address] URL: [email or web address] Date: 10/19/2023 Prepared by: Darice Conine  Exercises - Standing Infraspinatus/Teres Minor Release with [place] at [facility]  - 1 x daily - 7 x weekly - 1 sets - 1 reps - 2-3 minutes hold - Standing Bicep Curls Supinated with Dumbbells  - 1 x daily - 7 x weekly - 3 sets - 10 reps - Standing Shoulder Internal Rotation Stretch with Towel  - 1 x daily - 7 x weekly - 1 sets - 10 reps - 10 seconds hold - Standing Shoulder Flexion to 90 Degrees with Dumbbells  - 1 x daily - 7 x weekly - 3 sets - 10 reps - Standing Shoulder Horizontal Abduction with Resistance  - 1 x daily - 7 x weekly - 3 sets - 10 reps - Scaption with Dumbbells  - 1 x daily - 7 x weekly - 3 sets - 10 reps - Standing Bilateral Low Shoulder Row with Anchored Resistance  -  1 x daily - 7 x weekly - 3 sets - 10 reps - Shoulder extension with resistance - Neutral  - 1 x daily - 7 x weekly - 3 sets - 10 reps  ASSESSMENT:  CLINICAL IMPRESSION: Pt continues to progress well through protocol. Pt has some catching with 90/90 ER and prone Y. This is improved with TPR  Eval: Patient is a 48 y.o. female who was seen today for physical therapy evaluation and treatment for Lt bicep tenodesis with distal clavicle resection and acromial decompression. She presents with decreased strength, ROM and functional use and increased pain. She will benefit from skilled PT to address deficits and improve functional mobility  and independence.   OBJECTIVE IMPAIRMENTS: decreased activity tolerance, decreased ROM, decreased strength, impaired UE functional use, and pain.    GOALS: Goals reviewed with patient? Yes  SHORT  TERM GOALS: Target date: 09/28/2023    Pt will be independent with initial HEP Baseline: Goal status: MET  2.  Pt will demo 140 degrees of shoulder flexion PROM Baseline:  Goal status: MET    LONG TERM GOALS: Target date: 12/07/2023      Pt will be independent with advanced HEP Baseline:  Goal status: IN PROGRESS  2.  Pt will improve QuickDash to <= 46 to demo improved functional mobility Baseline:  Goal status: IN PROGRESS  3.  Pt will demo 4+/5 Lt shoulder strength to improve functional mobility Baseline:  Goal status: IN PROGRESS  4.  Pt will return to exercise with pain <= 2/10 Baseline:  Goal status: IN PROGRESS  5.  Pt will improve internal rotation to be able to fasten bra without difficulty Baseline:  Goal status: IN PROGRESS    PLAN:  PT FREQUENCY: 1-2x/week  PT DURATION: 6 weeks  PLANNED INTERVENTIONS: 97164- PT Re-evaluation, 97110-Therapeutic exercises, 97530- Therapeutic activity, [id number]- Neuromuscular re-education, 97535- Self Care, 02859- Manual therapy, [id number]- Aquatic Therapy, H9716- Electrical stimulation  (unattended), 97016- Vasopneumatic device, [id number]- Ionotophoresis 4mg /ml Dexamethasone , Patient/Family education, Taping, Cryotherapy, and Moist heat  PLAN FOR NEXT SESSION: NOTE FOR MD, FINALIZE HEP, progress per protocol   Jenaro Souder, PT 11/02/2023, 2:03 PM

## 2023-11-08 NOTE — Discharge Instructions (Signed)

## 2023-11-09 ENCOUNTER — Other Ambulatory Visit: Payer: Self-pay

## 2023-11-09 ENCOUNTER — Other Ambulatory Visit (HOSPITAL_COMMUNITY): Payer: Self-pay

## 2023-11-09 ENCOUNTER — Encounter: Admitting: Physical Therapy

## 2023-11-09 ENCOUNTER — Other Ambulatory Visit (HOSPITAL_BASED_OUTPATIENT_CLINIC_OR_DEPARTMENT_OTHER): Payer: Self-pay

## 2023-11-09 ENCOUNTER — Encounter (HOSPITAL_BASED_OUTPATIENT_CLINIC_OR_DEPARTMENT_OTHER): Payer: Self-pay

## 2023-11-09 ENCOUNTER — Ambulatory Visit
Admission: RE | Admit: 2023-11-09 | Discharge: 2023-11-09 | Disposition: A | Discharge status: Home / Self Care | Source: Ambulatory Visit | Attending: Sports Medicine | Admitting: Sports Medicine

## 2023-11-09 ENCOUNTER — Ambulatory Visit: Payer: Self-pay | Admitting: Family Medicine

## 2023-11-09 DIAGNOSIS — M5030 Other cervical disc degeneration, unspecified cervical region: Secondary | ICD-10-CM

## 2023-11-09 DIAGNOSIS — M47812 Spondylosis without myelopathy or radiculopathy, cervical region: Secondary | ICD-10-CM | POA: Diagnosis not present

## 2023-11-09 DIAGNOSIS — R232 Flushing: Secondary | ICD-10-CM | POA: Insufficient documentation

## 2023-11-09 MED ORDER — GABAPENTIN 300 MG PO CAPS
300.0000 mg | ORAL_CAPSULE | Freq: Every day | ORAL | 1 refills | Status: DC
  Filled 2023-11-09 (×2): qty 90, 90d supply, fill #0
  Filled 2024-02-03: qty 90, 90d supply, fill #1

## 2023-11-09 MED ORDER — IOPAMIDOL (ISOVUE-M 300) INJECTION 61%
1.0000 mL | Freq: Once | INTRAMUSCULAR | Status: AC | PRN
  Administered 2023-11-09: 1 mL via EPIDURAL

## 2023-11-09 MED ORDER — TRIAMCINOLONE ACETONIDE 40 MG/ML IJ SUSP (RADIOLOGY)
60.0000 mg | Freq: Once | INTRAMUSCULAR | Status: AC
Start: 2023-11-09 — End: 2023-11-09
  Administered 2023-11-09: 60 mg via EPIDURAL

## 2023-11-09 NOTE — Progress Notes (Signed)
 HI April Webster, with AHI of 4 I would work on non device management for sleep apnea.  Positional therapy, try to avoid sleeping on back. There are also pillows that detect snoring and change head position which can help.

## 2023-11-09 NOTE — Telephone Encounter (Signed)
 Tapered off of lyrica  for cervical pain.    Hotflashes have been severe and disrupting sleep since changing SSRIs. Ok with trial of gabapentin .  Meds ordered this encounter  Medications   gabapentin  (NEURONTIN ) 300 MG capsule    Sig: Take 1 capsule (300 mg total) by mouth at bedtime.    Dispense:  90 capsule    Refill:  1

## 2023-11-13 ENCOUNTER — Other Ambulatory Visit (HOSPITAL_COMMUNITY): Payer: Self-pay

## 2023-11-15 ENCOUNTER — Encounter: Payer: Self-pay | Admitting: Sports Medicine

## 2023-11-16 ENCOUNTER — Ambulatory Visit: Attending: Orthopaedic Surgery | Admitting: Physical Therapy

## 2023-11-16 ENCOUNTER — Encounter: Payer: Self-pay | Admitting: Physical Therapy

## 2023-11-16 DIAGNOSIS — M25512 Pain in left shoulder: Secondary | ICD-10-CM | POA: Insufficient documentation

## 2023-11-16 DIAGNOSIS — M6281 Muscle weakness (generalized): Secondary | ICD-10-CM | POA: Diagnosis not present

## 2023-11-16 NOTE — Therapy (Signed)
 OUTPATIENT PHYSICAL THERAPY SHOULDER TREATMENT AND DISCHARGE   Patient Name: April Webster MRN: [id number] DOB:1975-12-22, 48 y.o., female Today's Date: 11/16/2023  END OF SESSION:  PT End of Session - 11/16/23 1345     Visit Number 11    Number of Visits 21    Date for PT Re-Evaluation 12/07/23    Authorization Type [employer]    PT Start Time 1315    PT Stop Time 1342    PT Time Calculation (min) 27 min    Activity Tolerance Patient tolerated treatment well    Behavior During Therapy WFL for tasks assessed/performed                   Past Medical History:  Diagnosis Date   Allergy    Arthritis    Chronic idiopathic constipation    DDD (degenerative disc disease), cervical and lumbar    Female pattern hair loss    Graves disease    Hyperlipidemia    Hypertension    Hypothyroidism    Lichen sclerosus    Raynaud's disease    Past Surgical History:  Procedure Laterality Date   ABDOMINOPLASTY     AUGMENTATION MAMMAPLASTY Bilateral 05/2019   Silicone    CESAREAN SECTION     CHOLECYSTECTOMY     COSMETIC SURGERY     DILITATION & CURRETTAGE/HYSTROSCOPY WITH NOVASURE ABLATION N/A 02/09/2022   Procedure: DILATATION & CURETTAGE/HYSTEROSCOPY WITH NOVASURE ABLATION, REMOVAL OF IUD;  Surgeon: Cleatus Moccasin, MD;  Location: [facility] OR;  Service: Gynecology;  Laterality: N/A;   PLACEMENT OF BREAST IMPLANTS     POSTERIOR LUMBAR FUSION 2 WITH HARDWARE REMOVAL Left 08/25/2023   Procedure: ARTHROSCOPY, SHOULDER WITH DEBRIDEMENT, BIOTENDONESIS REPAIR;  Surgeon: Cristy Bonner DASEN, MD;  Location: [facility];  Service: Orthopedics;  Laterality: Left;   THYROID  SURGERY     TUBAL LIGATION     Patient Active Problem List   Diagnosis Date Noted   Hot flashes 11/09/2023   Abnormal weight gain 10/27/2023   Night sweats 10/18/2023   Chronic neck pain 04/13/2023   Recurrent infections 03/02/2023   Chronic left shoulder pain 12/21/2022   Attention deficit hyperactivity disorder  (ADHD), combined type 07/20/2022   Primary insomnia 09/09/2021   Chronic pain of right knee 08/12/2021   LGSIL on Pap smear of cervix 07/01/2020   Raynaud's disease 02/18/2020   IC (interstitial cystitis) 02/18/2020   Chronic constipation 02/18/2020   DDD (degenerative disc disease), cervical 12/14/2019   Gastroesophageal reflux disease 05/16/2019   Hypothyroidism 03/19/2019   Hyperlipidemia 03/19/2019   Facet syndrome, lumbar 03/19/2019   Female pattern hair loss 03/19/2019   Lichen sclerosus 03/19/2019   Upper airway cough syndrome 03/19/2019   IUD (intrauterine device) in place 12/06/2016   MDD (major depressive disorder), recurrent episode (HCC) 05/10/2011    PCP: Alvan  REFERRING PROVIDER: Cristy  REFERRING DIAG: Lt Biceps Tenodesis with clavicle resection and acromial decompression  THERAPY DIAG:  Acute pain of left shoulder  Muscle weakness (generalized)  Rationale for Evaluation and Treatment: Rehabilitation  ONSET DATE: 08/25/23  SUBJECTIVE:  SUBJECTIVE STATEMENT: Pt returns to surgeon tomorrow. She states she is still sore in the same place   Eval: Pt is s/p Lt biceps tenodesis with clavicle resection and acromial decompression. She has been using tylenol  and ice for pain control. She has been sleeping in the recliner and has been sleeping well. Hand dominance: Right  PERTINENT HISTORY: DDD, graves disease  PAIN:  Are you having pain? Yes: NPRS scale: 3/10 currently, 8/10 at worst Pain location: Lt shoulder Pain description: spasms Aggravating factors: end of day Relieving factors: ice, meds  PRECAUTIONS: Shoulder - see protocol   WEIGHT BEARING RESTRICTIONS: NWB Lt UE  FALLS:  Has patient fallen in last 6 months? 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    PATIENT SURVEYS:  Quick Dash 56.8     SENSATION: Numbness/tingling in Lt hand with full elbow flexion  POSTURE: Pt wearing sling on Lt UE  UPPER EXTREMITY ROM:   Passive ROM Right eval Left eval Left 09/21/23 Left 11/16/23  Shoulder flexion  84 151 165  Shoulder extension      Shoulder abduction  Not assessed due to protocol  165  Shoulder adduction      Shoulder internal rotation  45  65  Shoulder external rotation  21 44 63  Elbow flexion      Elbow extension      Wrist flexion      Wrist extension      Wrist ulnar deviation      Wrist radial deviation      Wrist pronation      Wrist supination      (Blank rows = not tested)  UPPER EXTREMITY MMT: Not assessed due to surgery MMT Right eval Left 11/16/23  Shoulder flexion  4  Shoulder extension    Shoulder abduction  4+  Shoulder adduction    Shoulder internal rotation    Shoulder external rotation    Middle trapezius    Lower trapezius    Elbow flexion  5  Elbow extension    Wrist flexion    Wrist extension    Wrist ulnar deviation    Wrist radial deviation    Wrist pronation    Wrist supination    Grip strength (lbs)    (Blank rows = not tested)   PALPATION:  Stitches intact Lt shoulder   [facility] Adult PT Treatment:                                                DATE: 11/16/23 Therapeutic Exercise/Activity: ROM and strength measurements (see above) Quick Dash 15.9% Manual Therapy: TPR and STM posterior shoulder musculature in prone and supine  Self Care: Education on d/c recommendations, home exercise  [facility] Adult PT Treatment:                                                DATE: 11/02/23 Therapeutic Exercise/Activity: Prone T 2# x 10 Attempt Y - pt only able to complete 2 - limited by pain Standing Serratus wall  slide Scap clock red TB Y,T,I red TB 90/90 ER red TB Manual Therapy: TPR and STM posterior shoulder musculature in prone and supine   [facility] Adult PT Treatment:                                                DATE: 10/26/23 Therapeutic Exercise/Activity: Row green TB 2 x 10 Extension green TB 2 x 10 ER red TB 2 x 10 IR red TB 2 x 10 Serratus wall slide x 10 Scaption 3# x 10 Abduction 3# x 10 Flexion 3# x 10 Supine serratus punch 3# x 10 Manual Therapy: STM and TPR Lt serratus, subscap, UT   [facility] Adult PT Treatment:                                                DATE: 10/19/23 Therapeutic Exercise: IR red TB x 10 ER red TB x 10 Flexion to 90 3# x 10 Abduction to 90 3#--> 2# x 10 Scaption 3# x 10 Row green TB x 10 Extension green TB x 10 Manual Therapy: STM and TPR Lt serratus, subscap, UT  Modalities: Vaso x 10 min Lt shoulder low pressure 34 degrees   PATIENT EDUCATION: Education details: PT POC and goals, HEP, protocol, precautions Person educated: Patient Education method: Explanation, Demonstration, and Handouts Education comprehension: verbalized understanding and returned demonstration  HOME EXERCISE PROGRAM: Access Code: [email or web address] URL: [email or web address] Date: 10/19/2023 Prepared by: Darice Conine  Exercises - Standing Infraspinatus/Teres Minor Release with [place] at [facility]  - 1 x daily - 7 x weekly - 1 sets - 1 reps - 2-3 minutes hold - Standing Bicep Curls Supinated with Dumbbells  - 1 x daily - 7 x weekly - 3 sets - 10 reps - Standing Shoulder Internal Rotation Stretch with Towel  - 1 x daily - 7 x weekly - 1 sets - 10 reps - 10 seconds hold - Standing Shoulder Flexion to 90 Degrees with Dumbbells  - 1 x daily - 7 x weekly - 3 sets - 10 reps - Standing Shoulder Horizontal Abduction with Resistance  - 1 x daily - 7 x weekly - 3 sets - 10 reps - Scaption with Dumbbells  - 1 x daily - 7 x weekly - 3 sets - 10 reps - Standing Bilateral Low  Shoulder Row with Anchored Resistance  - 1 x daily - 7 x weekly - 3 sets - 10 reps - Shoulder extension with resistance - Neutral  - 1 x daily - 7 x weekly - 3 sets - 10 reps  ASSESSMENT:  CLINICAL IMPRESSION: Pt has made great improvements with strength and ROM. She has improved functional UE use and is independent with HEP. She is ready for d/c from PT  Eval: Patient is a 48 y.o. female who was seen today for physical therapy evaluation and treatment for Lt bicep tenodesis with distal clavicle resection and acromial decompression. She presents with decreased strength, ROM and functional use and increased pain. She will benefit from skilled PT to address deficits and improve functional mobility  and independence.   OBJECTIVE IMPAIRMENTS: decreased activity tolerance, decreased ROM, decreased strength, impaired UE functional use, and pain.    GOALS: Goals reviewed with  patient? Yes  SHORT TERM GOALS: Target date: 09/28/2023    Pt will be independent with initial HEP Baseline: Goal status: MET  2.  Pt will demo 140 degrees of shoulder flexion PROM Baseline:  Goal status: MET    LONG TERM GOALS: Target date: 12/07/2023      Pt will be independent with advanced HEP Baseline:  Goal status: MET  2.  Pt will improve QuickDash to <= 46 to demo improved functional mobility Baseline:  Goal status: MET  3.  Pt will demo 4+/5 Lt shoulder strength to improve functional mobility Baseline:  Goal status: IN PROGRESS  4.  Pt will return to exercise with pain <= 2/10 Baseline:  Goal status: IN PROGRESS  5.  Pt will improve internal rotation to be able to fasten bra without difficulty Baseline:  Goal status: MET    PLAN:  PT FREQUENCY: 1-2x/week  PT DURATION: 6 weeks  PLANNED INTERVENTIONS: 97164- PT Re-evaluation, 97110-Therapeutic exercises, 97530- Therapeutic activity, 97112- Neuromuscular re-education, 97535- Self Care, [id number]- Manual therapy, [id number]- Aquatic Therapy,  H9716- Electrical stimulation (unattended), 97016- Vasopneumatic device, 97033- Ionotophoresis 4mg /ml Dexamethasone , Patient/Family education, Taping, Cryotherapy, and Moist heat  PLAN FOR NEXT SESSION: d/c   Neely Kammerer, PT 11/16/2023, 1:46 PM  PHYSICAL THERAPY DISCHARGE SUMMARY  Visits from Start of Care: 11  Current functional level related to goals / functional outcomes: Improved strength, ROM and functional mobility   Remaining deficits: See above   Education / Equipment: HEP   Patient agrees to discharge. Patient goals were partially met. Patient is being discharged due to being pleased with the current functional level.

## 2023-11-28 ENCOUNTER — Ambulatory Visit: Admitting: Sports Medicine

## 2023-12-02 ENCOUNTER — Telehealth: Payer: Self-pay | Admitting: Family Medicine

## 2023-12-02 ENCOUNTER — Other Ambulatory Visit (HOSPITAL_COMMUNITY): Payer: Self-pay

## 2023-12-02 DIAGNOSIS — F330 Major depressive disorder, recurrent, mild: Secondary | ICD-10-CM

## 2023-12-02 DIAGNOSIS — M5030 Other cervical disc degeneration, unspecified cervical region: Secondary | ICD-10-CM

## 2023-12-02 DIAGNOSIS — M542 Cervicalgia: Secondary | ICD-10-CM

## 2023-12-02 MED ORDER — DESVENLAFAXINE SUCCINATE ER 100 MG PO TB24
100.0000 mg | ORAL_TABLET | Freq: Every day | ORAL | 0 refills | Status: DC
  Filled 2023-12-02: qty 30, 30d supply, fill #0

## 2023-12-02 NOTE — Telephone Encounter (Signed)
 Had a brief conversation with Hoobler today about for the last couple of weeks having some increased stressors that are really driving depressive symptoms.  A coworker left the practice, stressors at home, daily stressors with patient care.  Having thoughts of not wanting to be here.  Also dealing with some chronic pain in her neck for which she has tried all kinds of topicals, different oral NSAIDs, even pain medication at times but that disrupt sleep quality.  Still not getting good sleep at night some nights.  We discussed options.  Will go ahead and place referral for therapy/counseling was previously engaged.  Would like to reestablish with a new provider.  Will try increasing Pristiq  to 100 mg will do a trial for 3 to 4 weeks to see if that is helpful.  Also encouraged her to call [phone] or [phone] number if having any active thoughts to harm herself.  Also in regards to chronic neck pain we will go ahead and get updated MRI to see if maybe she is surgical candidate and for planning purposes.  Last MRI was performed in 2022 so it has been a little over 3 years.  Orders Placed This Encounter  Procedures   MR Cervical Spine Wo Contrast    Standing Status:   Future    Expiration Date:   12/01/2024    What is the patient's sedation requirement?:   No Sedation    Does the patient have a pacemaker or implanted devices?:   No    Preferred imaging location?:   [name] (table limit-500lbs)   Ambulatory referral to Behavioral Health    Referral Priority:   Routine    Referral Type:   Psychiatric    Referral Reason:   Specialty Services Required    Requested Specialty:   Behavioral Health    Number of Visits Requested:   1   Meds ordered this encounter  Medications   desvenlafaxine  (PRISTIQ ) 100 MG 24 hr tablet    Sig: Take 1 tablet (100 mg total) by mouth daily.    Dispense:  30 tablet    Refill:  0    Hold 50mg  dose for trial of 100mg  for 30 days. Please deliver

## 2023-12-05 ENCOUNTER — Ambulatory Visit

## 2023-12-05 DIAGNOSIS — M5030 Other cervical disc degeneration, unspecified cervical region: Secondary | ICD-10-CM

## 2023-12-05 DIAGNOSIS — M542 Cervicalgia: Secondary | ICD-10-CM

## 2023-12-07 ENCOUNTER — Ambulatory Visit: Payer: Self-pay | Admitting: Family Medicine

## 2023-12-07 DIAGNOSIS — M5030 Other cervical disc degeneration, unspecified cervical region: Secondary | ICD-10-CM

## 2023-12-07 NOTE — Progress Notes (Signed)
 Hi Tosca, they did see some mild degenerative change at C2-3 as well as some mild disc bulge at C3-4.  It is more moderate disc disease and disc bulge at C4-5.  No major arthritis of the facets.  But there is a small foraminal spur on the right and narrowing because of the spurring on the right side.  You also have moderate to degenerative disc at C5-C6 but no foraminal narrowing.  And moderate degenerative disc at C6-C7.  C7-T1 is normal.  They did note that there has been progression since 2022.  How are you feeling about surgeon referral at this point?  Either orthospine or neurosurgeon?

## 2023-12-08 NOTE — Telephone Encounter (Signed)
 Orders Placed This Encounter  Procedures   AMB referral to orthopedics    Referral Priority:   Routine    Referral Type:   Consultation    Referred to Provider:   Beuford Anes, MD    Requested Specialty:   Orthopedic Surgery    Number of Visits Requested:   1

## 2023-12-08 NOTE — Telephone Encounter (Signed)
 Pended referral

## 2023-12-19 DIAGNOSIS — M542 Cervicalgia: Secondary | ICD-10-CM | POA: Diagnosis not present

## 2023-12-21 ENCOUNTER — Ambulatory Visit (INDEPENDENT_AMBULATORY_CARE_PROVIDER_SITE_OTHER): Admitting: Clinical

## 2023-12-21 DIAGNOSIS — F4323 Adjustment disorder with mixed anxiety and depressed mood: Secondary | ICD-10-CM
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
## 2023-12-23 ENCOUNTER — Other Ambulatory Visit: Payer: Self-pay

## 2023-12-23 ENCOUNTER — Other Ambulatory Visit (HOSPITAL_COMMUNITY): Payer: Self-pay

## 2023-12-23 ENCOUNTER — Other Ambulatory Visit: Payer: Self-pay | Admitting: Family Medicine

## 2023-12-26 ENCOUNTER — Other Ambulatory Visit: Payer: Self-pay

## 2023-12-26 ENCOUNTER — Other Ambulatory Visit (HOSPITAL_COMMUNITY): Payer: Self-pay

## 2023-12-26 MED ORDER — DESVENLAFAXINE SUCCINATE ER 100 MG PO TB24
100.0000 mg | ORAL_TABLET | Freq: Every day | ORAL | 1 refills | Status: AC
  Filled 2023-12-26: qty 90, 90d supply, fill #0
  Filled 2024-04-15: qty 90, 90d supply, fill #1

## 2023-12-28 ENCOUNTER — Ambulatory Visit (INDEPENDENT_AMBULATORY_CARE_PROVIDER_SITE_OTHER): Admitting: Clinical

## 2023-12-28 DIAGNOSIS — F4323 Adjustment disorder with mixed anxiety and depressed mood: Secondary | ICD-10-CM | POA: Diagnosis not present

## 2023-12-28 NOTE — Progress Notes (Signed)
 [facility] Counselor Progress Note - TELEMEDICINE VISIT  Patient ID: April Webster, MRN: [id number]    Date: 12/28/2023  Time Spent: 12:29pm  - 1:15pm  : 47 Minutes  Types of Service: Individual psychotherapy and Video visit  Client and/or Legal Guardian location: Work office - [employer], jewellery location: [place], [place] All persons participating in visit: Client & this therapist  I connected with client and/or legal guardian via Video Enabled Telemedicine Application  (Video is Caregility application) and verified that I am speaking with the correct person using two identifiers. Discussed confidentiality: Yes   I discussed the limitations of telemedicine and the availability of in person appointments.  Discussed there is a possibility of technology failure and discussed alternative modes of communication if that failure occurs.  I discussed that engaging in this telemedicine visit, they consent to the provision of behavioral healthcare and the services will be billed under their insurance.  Client and/or legal guardian expressed understanding and consented to Telemedicine visit: Yes    Presenting Concerns:  - Increased stress & depressive symptoms due to current relationship with her husband  Mental Status Exam: Appearance:  Neat     Behavior: Appropriate  Motor: Normal  Speech/Language:  Normal Rate  Affect: Appropriate  Mood: anxious and depressed  Thought process: normal  Thought content:   WNL  Sensory/Perceptual disturbances:   WNL  Orientation: oriented to person, place, time/date, situation, and day of week  Attention: Good  Concentration: Good  Memory: WNL  Fund of knowledge:  Good  Insight:   Good  Judgment:  Good  Impulse Control: Good   Risk Assessment: Danger to Self:  No Self-injurious Behavior: No Danger to Others: No Duty to Warn:no   Subjective:  April Webster reported ongoing concerns with her relationship with her husband and how it's  affecting her daily life.  Interventions: Solution-Oriented/Positive Psychology  Client Response: April Webster reported feeling anxious and depressed about her current relationship and trying to make decisions about her life.  She feels she has limited support at this time.  And she thinks about the impact her decisions will have on the various family members, all her dogs and finances.  She will contact [employer] to obtain additional information for resources.  Diagnosis:  Adjustment disorder with mixed anxiety and depressed mood   Goals, Assessment & Plan:   April Webster feels more anxious & depressed which makes it difficult to make personal decisions for herself.  And she feels like she cannot move forward with her life.  Today, she agreed to complete one task in order to increase her knowledge on the options about the decision she's trying to make.   Treatment Level Weekly  Modality - TeleMedicine - Video  Goal:  Increase adequate support system and gain additional knowledge in order to make informed decisions about her life. - April Webster will contact the [facility] today and ask for additional resources and free consultation appointment.  Target Date: 03/15/2024  Progress: Ongoing     Goals: Increase her ability to implement coping strategies in her daily life to manage various stressors as evidenced by self-report. - April Webster is making an effort to eat so she continues to have nutritional intake that impacts her energy level to manage the various stressors she's experiencing.  Target Date: 03/15/2024  Progress: Ongoing    Dewarren Ledbetter P. Trudy, MSW, LCSW [employer] Therapist Main Office: [phone]

## 2024-01-04 ENCOUNTER — Ambulatory Visit: Admitting: Clinical

## 2024-01-04 DIAGNOSIS — F4323 Adjustment disorder with mixed anxiety and depressed mood: Secondary | ICD-10-CM

## 2024-01-04 NOTE — Progress Notes (Signed)
 [facility] Counselor Progress Note - TELEMEDICINE VISIT  Patient ID: April Webster, MRN: [id number]    Date: 01/04/2024  Time Spent: 12:30pm   - 1:00pm  : 30 Minutes  Types of Service: Individual psychotherapy and Video visit  Client and/or Legal Guardian location: Work office - [name], [place] Therapist location: [facility] [employer] - [place], [place]  All persons participating in visit: Client & this therapist  I connected with client and/or legal guardian via Engineer, Civil (consulting)  (Video is Caregility application) and verified that I am speaking with the correct person using two identifiers. Discussed confidentiality: Yes   I discussed the limitations of telemedicine and the availability of in person appointments.  Discussed there is a possibility of technology failure and discussed alternative modes of communication if that failure occurs.  I discussed that engaging in this telemedicine visit, they consent to the provision of behavioral healthcare and the services will be billed under their insurance.  Client and/or legal guardian expressed understanding and consented to Telemedicine visit: Yes    Presenting Concerns:  - ongoing stressors with current relationship  Mental Status Exam: Appearance:  Casual     Behavior: Appropriate  Motor: Normal  Speech/Language:  Normal Rate  Affect: Appropriate  Mood: anxious and depressed  Thought process: normal  Thought content:   WNL  Sensory/Perceptual disturbances:   WNL  Orientation: oriented to person, place, time/date, situation, and day of week  Attention: Good  Concentration: Good  Memory: WNL  Fund of knowledge:  Good  Insight:   Good  Judgment:  Good  Impulse Control: Good   Risk Assessment: Danger to Self:  No Self-injurious Behavior: No Danger to Others: No Duty to Warn:no   Subjective:  April Webster reported ongoing stressors with current relationship and decisions she is trying to  make for herself and her family.     Interventions: Solution-Oriented/Positive Psychology  Client Response: April Webster reported that she obtained additional information from the [facility] last week that was helpful.  She has conflicting thoughts and emotions about the situation in her life.  She was able to identify people in her life that can support her with this situation.   Diagnosis:  Adjustment disorder with mixed anxiety and depressed mood   Goals, Assessment & Plan:   Continue with individual therapy to strengthen cognitive coping skills and identify solutions to help her through this adjustment in her life.  Treatment Level 3-4 times a month  Modality - TeleMedicine - Video   Goal:  Increase adequate support system and gain additional knowledge in order to make informed decisions about her life. - April Webster contacted [employer] to obtain additional information to help her make decisions and find appropriate resources through this process.   Target Date: 03/15/2024  Progress: Ongoing      Goals: Increase her ability to implement coping strategies in her daily life to manage various stressors as evidenced by self-report. - April Webster is working on challenging unhelpful or negative thoughts that increase her anxiety & depressive symptoms.   Target Date: 03/15/2024  Progress: Ongoing    April Webster P. Trudy, MSW, LCSW [employer] Therapist Main Office: [phone]

## 2024-01-12 ENCOUNTER — Ambulatory Visit (INDEPENDENT_AMBULATORY_CARE_PROVIDER_SITE_OTHER): Admitting: Clinical

## 2024-01-12 DIAGNOSIS — F4323 Adjustment disorder with mixed anxiety and depressed mood: Secondary | ICD-10-CM | POA: Diagnosis not present

## 2024-01-12 NOTE — Progress Notes (Addendum)
 [facility] Progress Note - TELEMEDICINE VISIT   Patient ID: April Webster, MRN: [id number]    Date: 01/12/2024  Time Spent: 5:01pm  - 6pm  : 59 Minutes  Types of Service: Individual psychotherapy and Video visit  Client and/or Legal Guardian location: Office - [place], [place] Webster location: [facility] [place], [place] All persons participating in visit: Client & this Webster  I connected with client and/or legal guardian via Video Enabled Telemedicine Application  (Video is Caregility application) and verified that I am speaking with the correct person using two identifiers. Discussed confidentiality: Yes   I discussed the limitations of telemedicine and the availability of in person appointments.  Discussed there is a possibility of technology failure and discussed alternative modes of communication if that failure occurs.  I discussed that engaging in this telemedicine visit, they consent to the provision of behavioral healthcare and the services will be billed under their insurance.  Client and/or legal guardian expressed understanding and consented to Telemedicine visit: Yes    Presenting Concerns:  Increased anxiety and depressive symptoms due to current relationship  Mental Status Exam: Appearance:  Casual     Behavior: Appropriate  Motor: Normal  Speech/Language:  Normal Rate  Affect: Depressed and Tearful  Mood: anxious and depressed  Thought process: normal  Thought content:   WNL  Sensory/Perceptual disturbances:   WNL  Orientation: oriented to person, place, time/date, situation, and day of week  Attention: Good  Concentration: Good  Memory: WNL  Fund of knowledge:  Good  Insight:   Good  Judgment:  Good  Impulse Control: Good   Risk Assessment: Danger to Self:  No Self-injurious Behavior: No Danger to Others: No Duty to Warn:no   Subjective:  [name] reported struggling with various emotions and  having difficulty making a decision about her current situation.   Interventions: Cognitive Behavioral Therapy-Exploring negative core beliefs that are affecting her thoughts, feelings & responses. Identified one things that she can do at this time and continue to reach out to her support systems.  Client Response: [name] was able to verbalize her beliefs that were developed from past traumatic experiences. She acknowledged how it's affecting her current thoughts, emotions and behaviors.  She was able to complete one action item during the visit and identified various people that she can count on for support.   Diagnosis:  Adjustment disorder with mixed anxiety and depressed mood   Goals, Assessment & Plan:   Continue with individual therapy to enhance coping skills and develop a support system that she can utilize through these life circumstances.  Frequency: 3-4 times a month  Modality: Telemedicine   Goal:  Increase adequate support system and gain additional knowledge in order to make informed decisions about her life. - April Webster found forms and completed a separation plan.   Target Date: 03/15/2024  Progress: Ongoing      Goals: Increase her ability to implement coping strategies in her daily life to manage various stressors as evidenced by self-report.  - April Webster continues to have difficulties with sleep but is making sure she eats.   - April Webster continues to challenge unhelpful or negative thoughts but is still overwhelmed with the emotions that she's experiencing.   Target Date: 03/15/2024  Progress: Ongoing     April Webster, MSW, LCSW [employer] Webster Main Office: [phone]

## 2024-01-18 ENCOUNTER — Other Ambulatory Visit: Payer: Self-pay

## 2024-01-18 ENCOUNTER — Other Ambulatory Visit: Payer: Self-pay | Admitting: Family Medicine

## 2024-01-18 ENCOUNTER — Ambulatory Visit: Admitting: Clinical

## 2024-01-18 ENCOUNTER — Other Ambulatory Visit (HOSPITAL_COMMUNITY): Payer: Self-pay

## 2024-01-18 DIAGNOSIS — F330 Major depressive disorder, recurrent, mild: Secondary | ICD-10-CM | POA: Diagnosis not present

## 2024-01-18 NOTE — Progress Notes (Unsigned)
 [facility] Counselor Progress Note - TELEMEDICINE VISIT  Patient ID: April Webster, MRN: [id number]    Date: 01/18/2024  Time Spent: 2pm   - 3pm  : 60 Minutes  Types of Service: Individual psychotherapy and Video visit  Client and/or Legal Guardian location: Client's work Therapist location: [facility] [employer] All persons participating in visit: Client & this therapist  I connected with client and/or legal guardian via Engineer, Civil (consulting)  (Video is Surveyor, mining) and verified that I am speaking with the correct person using two identifiers. Discussed confidentiality: Yes   I discussed the limitations of telemedicine and the availability of in person appointments.  Discussed there is a possibility of technology failure and discussed alternative modes of communication if that failure occurs.  I discussed that engaging in this telemedicine visit, they consent to the provision of behavioral healthcare and the services will be billed under their insurance.  Client and/or legal guardian expressed understanding and consented to Telemedicine visit: Yes    Presenting Concerns: ***  Mental Status Exam: Appearance:  {PSY:22683}     Behavior: {PSY:21022743}  Motor: {PSY:22302}  Speech/Language:  {PSY:22685}  Affect: {PSY:22687}  Mood: {PSY:31886}  Thought process: {PSY:31888}  Thought content:   {[phone]}  Sensory/Perceptual disturbances:   {PSY:478-501-2839}  Orientation: {PSY:30297}  Attention: {PSY:22877}  Concentration: {PSY:906-737-4409}  Memory: {PSY:867-632-6618}  Fund of knowledge:  {PSY:906-737-4409}  Insight:   {PSY:906-737-4409}  Judgment:  {PSY:906-737-4409}  Impulse Control: {PSY:906-737-4409}   Risk Assessment: Danger to Self:  {PSY:22692} Self-injurious Behavior: {PSY:22692} Danger to Others: {PSY:22692} Duty to Warn:{PSY:311194}   Subjective:  ***   Interventions: {PSY:367-721-2709}  Client Response: ***   Diagnosis:  No  diagnosis found.   Goals, Assessment & Plan:   ***  Treatment Level {Frequency of sessions.:26745}  Modality - TeleMedicine - Video  Goal:  ***  Target Date: ***  Progress: ***     Goals: ***  Target Date: ***  Progress: ***    Husayn Reim P. Trudy, MSW, LCSW [employer] Therapist Main Office: [phone]

## 2024-01-20 ENCOUNTER — Other Ambulatory Visit (HOSPITAL_COMMUNITY): Payer: Self-pay

## 2024-01-20 MED ORDER — HYDROCHLOROTHIAZIDE 25 MG PO TABS
25.0000 mg | ORAL_TABLET | Freq: Every day | ORAL | 0 refills | Status: AC | PRN
  Filled 2024-01-20: qty 90, 90d supply, fill #0

## 2024-01-21 ENCOUNTER — Telehealth: Admitting: Physician Assistant

## 2024-01-21 DIAGNOSIS — J029 Acute pharyngitis, unspecified: Secondary | ICD-10-CM | POA: Diagnosis not present

## 2024-01-21 MED ORDER — LIDOCAINE VISCOUS HCL 2 % MT SOLN: OROMUCOSAL | 0 refills | Status: DC

## 2024-01-21 NOTE — Progress Notes (Signed)
 We are sorry that you are not feeling well.  Here is how we plan to help!  Your symptoms indicate a likely viral infection (Pharyngitis).   Pharyngitis is inflammation in the back of the throat which can cause a sore throat, scratchiness and sometimes difficulty swallowing.   Pharyngitis is typically caused by a respiratory virus and will just run its course.  Please keep in mind that your symptoms could last up to 10 days.    For throat pain, we recommend over the counter oral pain relief medications such as acetaminophen  or aspirin, or anti-inflammatory medications such as ibuprofen  or naproxen sodium.  Topical treatments such as oral throat lozenges or sprays may be used as needed. For throat pain, I have prescribed a Viscous Lidocaine  2% solution. Swallow 5-10 mL every 4-6 hours as needed for sore throat. DO NOT eat or drink anything for 15-20 minutes after swallowing to allow the medication to coat the throat.SABRA  Avoid close contact with loved ones, especially the very young and elderly.  Remember to wash your hands thoroughly throughout the day as this is the number one way to prevent the spread of infection! We also recommend that you periodically wipe down door knobs and counters with disinfectant.  After careful review of your answers, I would not recommend and antibiotic for your condition.  Antibiotics should not be used to treat conditions that we suspect are caused by viruses like the virus that causes the common cold or flu.  Providers prescribe antibiotics to treat infections caused by bacteria. Antibiotics are very powerful in treating bacterial infections when they are used properly.  To maintain their effectiveness, they should be used only when necessary.  Overuse of antibiotics has resulted in the development of super bugs that are resistant to treatment!    Some people with strep throat, however, can have atypical symptoms. As such, if anything continued to progress despite treatment  recommendations, you may need formal testing in clinic or office.  Home Care: Only take medications as instructed by your medical team. Do not drink alcohol while taking these medications. A steam or ultrasonic humidifier can help congestion.  You can place a towel over your head and breathe in the steam from hot water coming from a faucet. Avoid close contacts especially the very young and the elderly. Cover your mouth when you cough or sneeze. Always remember to wash your hands.  Get Help Right Away If: You develop worsening fever or throat pain. You develop a severe head ache or visual changes. Your symptoms persist after you have completed your treatment plan.  Make sure you Understand these instructions. Will watch your condition. Will get help right away if you are not doing well or get worse.  Your e-visit answers were reviewed by a board certified advanced clinical practitioner to complete your personal care plan.  Depending on the condition, your plan could have included both over the counter or prescription medications.  If there is a problem please reply once you have received a response from your provider.  Your safety is important to us .  If you have drug allergies check your prescription carefully.    You can use MyChart to ask questions about todays visit, request a non-urgent call back, or ask for a work or school excuse for 24 hours related to this e-Visit. If it has been greater than 24 hours you will need to follow up with your provider, or enter a new e-Visit to address those concerns.  You will get an e-mail in the next two days asking about your experience.  I hope that your e-visit has been valuable and will speed your recovery. Thank you for using e-visits.   I have spent 5 minutes in review of e-visit questionnaire, review and updating patient chart, medical decision making and response to patient.   Teena Shuck, PA-C

## 2024-01-23 ENCOUNTER — Other Ambulatory Visit: Payer: Self-pay

## 2024-01-23 ENCOUNTER — Telehealth: Payer: Self-pay | Admitting: Family Medicine

## 2024-01-23 DIAGNOSIS — Z202 Contact with and (suspected) exposure to infections with a predominantly sexual mode of transmission: Secondary | ICD-10-CM | POA: Diagnosis not present

## 2024-01-23 NOTE — Telephone Encounter (Signed)
 No orders of the defined types were placed in this encounter.  Orders Placed This Encounter  Procedures   Ct/GC NAA, Pharyngeal   STI Profile, CT/NG/TV

## 2024-01-25 ENCOUNTER — Ambulatory Visit: Admitting: Clinical

## 2024-01-25 ENCOUNTER — Ambulatory Visit: Payer: Self-pay | Admitting: Family Medicine

## 2024-01-25 DIAGNOSIS — F330 Major depressive disorder, recurrent, mild: Secondary | ICD-10-CM

## 2024-01-25 LAB — STI PROFILE, CT/NG/TV
HCV Ab: NONREACTIVE
HIV Screen 4th Generation wRfx: NONREACTIVE
Hep B Core Total Ab: NEGATIVE
Hep B Surface Ab, Qual: REACTIVE
Hepatitis B Surface Ag: NEGATIVE
RPR Ser Ql: NONREACTIVE

## 2024-01-25 LAB — CT/GC NAA, PHARYNGEAL
C TRACH RRNA NPH QL PCR: NEGATIVE
N GONORRHOEA RRNA NPH QL PCR: NEGATIVE

## 2024-01-25 LAB — HCV INTERPRETATION

## 2024-01-25 LAB — SPECIMEN STATUS REPORT

## 2024-01-25 NOTE — Progress Notes (Signed)
 [facility] Counselor Progress Note - TELEMEDICINE VISIT  Patient ID: April Webster, MRN: [id number]    Date: 01/25/2024  Time Spent: 3pm   - 3:54pm  : 54 Minutes  Types of Service: Individual psychotherapy and Video visit  Client and/or Legal Guardian location: Work office - [name], [place] Therapist location: [facility] [employer] - [place], [place] All persons participating in visit: Client & this therapist  I connected with client and/or legal guardian via Engineer, Civil (consulting)  (Video is Caregility application) and verified that I am speaking with the correct person using two identifiers. Discussed confidentiality: Yes   I discussed the limitations of telemedicine and the availability of in person appointments.  Discussed there is a possibility of technology failure and discussed alternative modes of communication if that failure occurs.  I discussed that engaging in this telemedicine visit, they consent to the provision of behavioral healthcare and the services will be billed under their insurance.  Client and/or legal guardian expressed understanding and consented to Telemedicine visit: Yes    Presenting Concerns:  - Physically sick, increased stress and difficulties with making a decision about her relationship  Mental Status Exam: Appearance:  Tired since she reported feeling sick     Behavior: Appropriate  Motor: Normal  Speech/Language:  Normal Rate  Affect: Depressed  Mood: anxious, depressed, and irritable  Thought process: normal  Thought content:   WNL  Sensory/Perceptual disturbances:   WNL  Orientation: oriented to person, place, time/date, situation, and day of week  Attention: Good  Concentration: Fair  Memory: WNL  Fund of knowledge:  Good  Insight:   Good  Judgment:  Good  Impulse Control: Good   Risk Assessment: Danger to Self:  No Self-injurious Behavior: No Danger to Others: No Duty to Warn:no   Subjective:   April Webster reported she was feeling sick due to the flu and still is unsure of what to do about her relationship with her spouse.  Interventions: Interpersonal -reviewed ongoing struggle with interpersonal relationship with spouse, her core beliefs affecting her actions, and identifying support systems.  Client Response: April Webster was able to verbalize patterns in her relationship and beliefs that are affecting her difficulties to make decisions in her life, which is making her feel more depressed.  April Webster was able to ask a friend for support if she needs it and was surprised they were agreeable to it.  April Webster identified a closer friend that lives in a different city that may be able to be with her for additional support. April Webster also agreed to develop a plan to minimize safety concerns since there are firearms in the home.   Diagnosis:  Mild episode of recurrent major depressive disorder   Goals, Assessment & Plan:   April Webster continues to actively engage in therapy and completing homework assignments addressing her interpersonal concerns that are affecting her mood & overall health.   Treatment Level Weekly  Modality - TeleMedicine - Video  Goal:  Increase adequate support system and gain additional knowledge in order to make informed decisions about her life. -April Webster will call her friend in [place] for support & develop a plan for safety at home.   Target Date: 03/15/2024  Progress: Ongoing      Goals: Increase her ability to implement coping strategies in her daily life to manage various stressors as evidenced by self-report. - Zayneb continues to make sure she is eating and drinking water, even when she feels sick or does not have an appetite.  Target Date: 03/15/2024  Progress: Ongoing        April Webster Witting P. Trudy, MSW, LCSW [employer] Therapist Main Office: [phone]

## 2024-01-25 NOTE — Progress Notes (Signed)
Looks good so far!

## 2024-01-25 NOTE — Progress Notes (Signed)
 Throat swab- negative

## 2024-01-31 ENCOUNTER — Other Ambulatory Visit (HOSPITAL_COMMUNITY): Payer: Self-pay

## 2024-01-31 ENCOUNTER — Other Ambulatory Visit: Payer: Self-pay

## 2024-01-31 ENCOUNTER — Telehealth: Payer: Self-pay | Admitting: Family Medicine

## 2024-01-31 MED ORDER — HYDROCORTISONE ACETATE 25 MG RE SUPP
25.0000 mg | Freq: Two times a day (BID) | RECTAL | 3 refills | Status: AC | PRN
  Filled 2024-01-31: qty 24, 12d supply, fill #0

## 2024-01-31 NOTE — Telephone Encounter (Signed)
 Passing a little bit of blood with bowel movements this past week she has some known internal hemorrhoids and feels like they are just flared would like to have some suppository sent to mail order pharmacy.   Meds ordered this encounter  Medications   hydrocortisone (ANUSOL-HC) 25 MG suppository    Sig: Place 1 suppository (25 mg total) rectally 2 (two) times daily as needed for hemorrhoids or anal itching.    Dispense:  24 suppository    Refill:  3    Please mail

## 2024-02-01 ENCOUNTER — Ambulatory Visit: Admitting: Dermatology

## 2024-02-01 ENCOUNTER — Ambulatory Visit: Admitting: Clinical

## 2024-02-01 DIAGNOSIS — F330 Major depressive disorder, recurrent, mild: Secondary | ICD-10-CM | POA: Diagnosis not present

## 2024-02-01 NOTE — Progress Notes (Signed)
 [facility] Counselor Progress Note - TELEMEDICINE VISIT  Patient ID: April Webster, MRN: [id number]    Date: 02/01/2024  Time Spent: 3pm  - 3:55pm  : 55 Minutes  Types of Service: Individual psychotherapy and Video visit  Client and/or Legal Guardian location: Client's work office - April Webster, [place] Therapist location: [facility] [employer] [facility] [place], April Webster All persons participating in visit: Client & this therapist  I connected with client and/or legal guardian via Engineer, Civil (consulting)  (Video is Caregility application) and verified that I am speaking with the correct person using two identifiers. Discussed confidentiality: Yes   I discussed the limitations of telemedicine and the availability of in person appointments.  Discussed there is a possibility of technology failure and discussed alternative modes of communication if that failure occurs.  I discussed that engaging in this telemedicine visit, they consent to the provision of behavioral healthcare and the services will be billed under their insurance.  Client and/or legal guardian expressed understanding and consented to Telemedicine visit: Yes    Presenting Concerns:  -Family stressors and conflicts - Health concerns  Mental Status Exam: Appearance:  Casual     Behavior: Appropriate  Motor: Normal  Speech/Language:  Normal Rate  Affect: Appropriate  Mood: anxious  Thought process: normal  Thought content:   WNL  Sensory/Perceptual disturbances:   WNL  Orientation: oriented to person, place, time/date, situation, and day of week  Attention: Good  Concentration: Good  Memory: WNL  Fund of knowledge:  Good  Insight:   Good  Judgment:  Good  Impulse Control: Good   Risk Assessment: Danger to Self:  No Self-injurious Behavior: No Danger to Others: No Duty to Warn:no   Subjective:  April Webster reported that it was eventful this past week and April Webster shared the separation agreement with  April Webster husband.   Interventions: Interpersonal-explored changes in interactions, feelings & behaviors since the situation occurred over the weekend.  Identified strengths and accomplishments.  Client Response: April Webster reported that April Webster was able to share the documentation for a separation agreement and verbalized April Webster needs.  April Webster was able to state April Webster boundaries and what April Webster expectations are moving forward.  April Webster feels like April Webster is able to take a step instead of feeling stuck with making decisions for April Webster life.  April Webster was able to identify the differences in behaviors or interactions that's happening after this recent situation compared to previous discussions to change behaviors.   Diagnosis:  Mild episode of recurrent major depressive disorder   Goals, Assessment & Plan:   April Webster reported progress with being able to communicate April Webster needs and advocate for herself.  April Webster will continue with therapy to enhance April Webster coping skills and self-confidence.  Treatment Level Weekly   Modality - TeleMedicine - Video  Goal:  Increase adequate support system and gain additional knowledge in order to make informed decisions about April Webster life. - April Webster and although April Webster Webster was not available, April Webster spoke with another Webster. The other Webster informed April Webster that the can be available for support in situations like separation discussions even if it's not a crisis.  Target Date: 03/15/2024  Progress: Ongoing      Goals: Increase April Webster ability to implement coping strategies in April Webster daily life to manage various stressors as evidenced by self-report. - April Webster was able to set boundaries for herself with the people around April Webster so that April Webster can have time for herself. - April Webster was feeling more confident about April Webster  actions.  April Webster acknowledged continuing unhealthy coping skill, however, April Webster is working on changing it to healthier strategies.     Target Date: 03/15/2024  Progress: Ongoing      April Webster P. April Webster, MSW, LCSW [employer] Therapist Main Office: [phone]

## 2024-02-02 ENCOUNTER — Telehealth: Payer: Self-pay | Admitting: Family Medicine

## 2024-02-02 ENCOUNTER — Other Ambulatory Visit: Payer: Self-pay | Admitting: Family Medicine

## 2024-02-02 ENCOUNTER — Ambulatory Visit: Admitting: Clinical

## 2024-02-02 DIAGNOSIS — M542 Cervicalgia: Secondary | ICD-10-CM

## 2024-02-02 DIAGNOSIS — M5030 Other cervical disc degeneration, unspecified cervical region: Secondary | ICD-10-CM

## 2024-02-02 NOTE — Telephone Encounter (Signed)
 Orders Placed This Encounter  Procedures   Ambulatory referral to Sports Medicine    Referral Priority:   Routine    Referral Type:   Consultation    Number of Visits Requested:   1

## 2024-02-02 NOTE — Progress Notes (Signed)
 Orders Placed This Encounter  Procedures   AMB referral to orthopedics    Referral Priority:   Routine    Referral Type:   Consultation    Referred to Provider:   Curtis Debby PARAS, MD    Number of Visits Requested:   1

## 2024-02-03 ENCOUNTER — Other Ambulatory Visit (HOSPITAL_COMMUNITY): Payer: Self-pay

## 2024-02-07 ENCOUNTER — Other Ambulatory Visit (HOSPITAL_COMMUNITY): Payer: Self-pay

## 2024-02-07 ENCOUNTER — Other Ambulatory Visit: Payer: Self-pay

## 2024-02-07 DIAGNOSIS — B999 Unspecified infectious disease: Secondary | ICD-10-CM

## 2024-02-08 ENCOUNTER — Ambulatory Visit: Payer: Self-pay | Admitting: Family Medicine

## 2024-02-08 ENCOUNTER — Other Ambulatory Visit (HOSPITAL_COMMUNITY): Payer: Self-pay

## 2024-02-08 ENCOUNTER — Telehealth: Payer: Self-pay | Admitting: Family Medicine

## 2024-02-08 DIAGNOSIS — M47812 Spondylosis without myelopathy or radiculopathy, cervical region: Secondary | ICD-10-CM | POA: Diagnosis not present

## 2024-02-08 DIAGNOSIS — B9689 Other specified bacterial agents as the cause of diseases classified elsewhere: Secondary | ICD-10-CM

## 2024-02-08 LAB — WET PREP FOR TRICH, YEAST, CLUE
Clue Cell Exam: POSITIVE — AB
Trichomonas Exam: NEGATIVE
Yeast Exam: NEGATIVE

## 2024-02-08 MED ORDER — FLUCONAZOLE 150 MG PO TABS: 150.0000 mg | ORAL_TABLET | Freq: Once | ORAL | 1 refills | Status: AC

## 2024-02-08 MED ORDER — CLINDAMYCIN PHOSPHATE 2 % VA CREA: 1.0000 | TOPICAL_CREAM | Freq: Every day | VAGINAL | 1 refills | Status: DC

## 2024-02-08 MED ORDER — ACETAMINOPHEN-CODEINE 300-30 MG PO TABS
1.0000 | ORAL_TABLET | Freq: Four times a day (QID) | ORAL | 0 refills | Status: DC
  Filled 2024-02-08: qty 30, 8d supply, fill #0

## 2024-02-08 NOTE — Telephone Encounter (Signed)
 Treatment for BV.  Prefers clinda over metronidazole .  Prescription sent also Diflucan  sent.  Meds ordered this encounter  Medications   clindamycin  (CLEOCIN ) 2 % vaginal cream    Sig: Place 1 Applicatorful vaginally at bedtime. X 7 night s    Dispense:  40 g    Refill:  1   fluconazole  (DIFLUCAN ) 150 MG tablet    Sig: Take 1 tablet (150 mg total) by mouth once for 1 dose.    Dispense:  1 tablet    Refill:  1

## 2024-02-08 NOTE — Progress Notes (Signed)
 Hey, just got this. Will send over metronidazole .  What pharmacy?

## 2024-02-13 NOTE — Progress Notes (Signed)
 Last read by Zada LITTIE Palin at 5:36PM on 02/08/2024.

## 2024-02-15 ENCOUNTER — Other Ambulatory Visit (HOSPITAL_BASED_OUTPATIENT_CLINIC_OR_DEPARTMENT_OTHER): Payer: Self-pay

## 2024-02-15 ENCOUNTER — Telehealth: Payer: Self-pay | Admitting: Family Medicine

## 2024-02-15 ENCOUNTER — Other Ambulatory Visit: Payer: Self-pay

## 2024-02-15 ENCOUNTER — Other Ambulatory Visit: Payer: Self-pay | Admitting: Family Medicine

## 2024-02-15 ENCOUNTER — Ambulatory Visit: Admitting: Clinical

## 2024-02-15 DIAGNOSIS — F332 Major depressive disorder, recurrent severe without psychotic features: Secondary | ICD-10-CM

## 2024-02-15 MED ORDER — LUMATEPERONE TOSYLATE 10.5 MG PO CAPS
42.0000 mg | ORAL_CAPSULE | Freq: Every day | ORAL | 1 refills | Status: DC
  Filled 2024-02-15 (×2): qty 30, 7d supply, fill #0

## 2024-02-15 MED ORDER — LUMATEPERONE TOSYLATE 42 MG PO CAPS
42.0000 mg | ORAL_CAPSULE | Freq: Every day | ORAL | 1 refills | Status: DC
  Filled 2024-02-15: qty 30, 30d supply, fill #0
  Filled 2024-03-12: qty 30, 30d supply, fill #1

## 2024-02-15 NOTE — Progress Notes (Unsigned)
 [facility] Counselor Progress Note - TELEMEDICINE VISIT  Patient ID: April Webster, MRN: [id number]    Date: 02/15/24  Time Spent: 3pm   - 4pm  : 60 Minutes  Types of Service: Individual psychotherapy and Video visit  Client and/or Legal Guardian location: Client's office- April Webster, [place] Therapist location: [facility] [place], [place]  All persons participating in visit: Client & this therapist  I connected with client and/or legal guardian via Video Enabled Telemedicine Application  (Video is Caregility application) and verified that I am speaking with the correct person using two identifiers. Discussed confidentiality: Yes   I discussed the limitations of telemedicine and the availability of in person appointments.  Discussed there is a possibility of technology failure and discussed alternative modes of communication if that failure occurs.  I discussed that engaging in this telemedicine visit, they consent to the provision of behavioral healthcare and the services will be billed under their insurance.  Client and/or legal guardian expressed understanding and consented to Telemedicine visit: Yes    Presenting Concerns:  Increased depressive symptoms  Mental Status Exam: Appearance:  {PSY:22683}     Behavior: {PSY:21022743}  Motor: {PSY:22302}  Speech/Language:  {PSY:22685}  Affect: {PSY:22687}  Mood: {PSY:31886}  Thought process: {PSY:31888}  Thought content:   {[phone]}  Sensory/Perceptual disturbances:   {PSY:([phone]}  Orientation: {PSY:30297}  Attention: {PSY:22877}  Concentration: {PSY:513-754-4199}  Memory: {PSY:409-010-0382}  Fund of knowledge:  {PSY:513-754-4199}  Insight:   {PSY:513-754-4199}  Judgment:  {PSY:513-754-4199}  Impulse Control: {PSY:513-754-4199}   Risk Assessment: Danger to Self:  {PSY:22692} Self-injurious Behavior: {PSY:22692} Danger to Others: {PSY:22692} Duty to Warn:{PSY:311194}   Subjective:  ***    Interventions: {PSY:408-049-2442}  Client Response: ***  Identified core belief that is affecting her thoughts, emotions & daily functioning.  Diagnosis:  No diagnosis found.   Goals, Assessment & Plan:   ***    This Clinician will send info on Core Beliefs - Filters/Glasses  Treatment Level {Frequency of sessions.:26745}  Modality - TeleMedicine - Video    Goal:  Increase adequate support system and gain additional knowledge in order to make informed decisions about her life. -*** Target Date: 03/15/2024  Progress: Ongoing      Goals: Increase her ability to implement coping strategies in her daily life to manage various stressors as evidenced by self-report. - Ivylynn will think about increasing physical activities.  - Genecis will write down positive qualities that people have said to her to focus on those things.   Target Date: 03/15/2024  Progress: Ongoing    Leighana Neyman P. Trudy, MSW, LCSW [employer] Therapist Main Office: [phone]

## 2024-02-15 NOTE — Telephone Encounter (Signed)
 Feeling more depressed, tearful, has therapy appt this afternoon.  Would like to add something to the pristiq   Meds ordered this encounter  Medications   lumateperone tosylate (CAPLYTA) 10.5 MG capsule    Sig: Take 4 capsules (42 mg total) by mouth daily.    Dispense:  30 capsule    Refill:  1    Please use coupon card

## 2024-02-15 NOTE — Progress Notes (Signed)
 Medication not covered as written for 10.5mg  tablets.  Updated rx sent in for 42mg  tablets.

## 2024-02-16 ENCOUNTER — Other Ambulatory Visit (HOSPITAL_BASED_OUTPATIENT_CLINIC_OR_DEPARTMENT_OTHER): Payer: Self-pay

## 2024-02-17 ENCOUNTER — Other Ambulatory Visit (INDEPENDENT_AMBULATORY_CARE_PROVIDER_SITE_OTHER): Payer: Self-pay | Admitting: *Deleted

## 2024-02-17 ENCOUNTER — Telehealth: Payer: Self-pay | Admitting: Family Medicine

## 2024-02-17 DIAGNOSIS — R42 Dizziness and giddiness: Secondary | ICD-10-CM

## 2024-02-17 DIAGNOSIS — E039 Hypothyroidism, unspecified: Secondary | ICD-10-CM | POA: Diagnosis not present

## 2024-02-17 DIAGNOSIS — R000 Tachycardia, unspecified: Secondary | ICD-10-CM

## 2024-02-17 NOTE — Telephone Encounter (Signed)
 Got up this morning took medications including amlodipine  which she takes for Raynaud's and her diuretic this morning which she just uses as needed for swelling.  But by the time she got to work systolic pressure was in the 90s started feeling lightheaded and dizzy like she might pass out.  Pulse is tacky even at rest.  She has been trying to push fluids and get some food and but still not feeling great.  Has lost a fair amount of weight recently and so wondering if thyroid  levels could be off.  No sore throat or other upper respiratory symptoms.  Orders Placed This Encounter  Procedures   CMP14+EGFR   TSH   CBC with Differential/Platelet   Will get EKG today.

## 2024-02-18 ENCOUNTER — Encounter: Payer: Self-pay | Admitting: Clinical

## 2024-02-18 LAB — CMP14+EGFR
ALT: 44 IU/L — ABNORMAL HIGH (ref 0–32)
AST: 48 IU/L — ABNORMAL HIGH (ref 0–40)
Albumin: 4.8 g/dL (ref 3.9–4.9)
Alkaline Phosphatase: 76 IU/L (ref 41–116)
BUN/Creatinine Ratio: 27 — ABNORMAL HIGH (ref 9–23)
BUN: 20 mg/dL (ref 6–24)
Bilirubin Total: 0.4 mg/dL (ref 0.0–1.2)
CO2: 24 mmol/L (ref 20–29)
Calcium: 10 mg/dL (ref 8.7–10.2)
Chloride: 92 mmol/L — ABNORMAL LOW (ref 96–106)
Creatinine, Ser: 0.75 mg/dL (ref 0.57–1.00)
Globulin, Total: 2.1 g/dL (ref 1.5–4.5)
Glucose: 95 mg/dL (ref 70–99)
Potassium: 3.5 mmol/L (ref 3.5–5.2)
Sodium: 137 mmol/L (ref 134–144)
Total Protein: 6.9 g/dL (ref 6.0–8.5)
eGFR: 99 mL/min/1.73 (ref >59)

## 2024-02-18 LAB — CBC WITH DIFFERENTIAL/PLATELET
Basophils Absolute: 0 x10E3/uL (ref 0.0–0.2)
Basos: 0 %
EOS (ABSOLUTE): 0 x10E3/uL (ref 0.0–0.4)
Eos: 0 %
Hematocrit: 41.5 % (ref 34.0–46.6)
Hemoglobin: 13.9 g/dL (ref 11.1–15.9)
Immature Grans (Abs): 0 x10E3/uL (ref 0.0–0.1)
Immature Granulocytes: 0 %
Lymphocytes Absolute: 1.6 x10E3/uL (ref 0.7–3.1)
Lymphs: 24 %
MCH: 33.5 pg — ABNORMAL HIGH (ref 26.6–33.0)
MCHC: 33.5 g/dL (ref 31.5–35.7)
MCV: 100 fL — ABNORMAL HIGH (ref 79–97)
Monocytes Absolute: 0.8 x10E3/uL (ref 0.1–0.9)
Monocytes: 11 %
Neutrophils Absolute: 4.2 x10E3/uL (ref 1.4–7.0)
Neutrophils: 65 %
Platelets: 286 x10E3/uL (ref 150–450)
RBC: 4.15 x10E6/uL (ref 3.77–5.28)
RDW: 12.4 % (ref 11.7–15.4)
WBC: 6.7 x10E3/uL (ref 3.4–10.8)

## 2024-02-18 LAB — TSH: TSH: 0.49 u[IU]/mL (ref 0.450–4.500)

## 2024-02-20 ENCOUNTER — Ambulatory Visit: Payer: Self-pay | Admitting: Family Medicine

## 2024-02-20 NOTE — Progress Notes (Signed)
 HI Lakia, liver is back up.   Thyroid  look just a little over medication. If your taking whole tab daily just split tab on Sundays and take half a tab on Sundays.

## 2024-02-22 ENCOUNTER — Ambulatory Visit: Admitting: Clinical

## 2024-02-22 DIAGNOSIS — F332 Major depressive disorder, recurrent severe without psychotic features: Secondary | ICD-10-CM

## 2024-02-22 NOTE — Progress Notes (Signed)
 [facility] Counselor Progress Note - TELEMEDICINE VISIT  Patient ID: April Webster, MRN: [id number]    Date: 02/22/24  Time Spent: 3:05PM   - 4pm  : 55 Minutes  Types of Service: Individual psychotherapy and Video visit  Client and/or Legal Guardian location: Work office- Therapist location: [facility] [employer] - [place], [place] All persons participating in visit: Client & this therapist  I connected with client and/or legal guardian via Engineer, Civil (consulting)  (Video is Surveyor, mining) and verified that I am speaking with the correct person using two identifiers. Discussed confidentiality: Yes   I discussed the limitations of telemedicine and the availability of in person appointments.  Discussed there is a possibility of technology failure and discussed alternative modes of communication if that failure occurs.  I discussed that engaging in this telemedicine visit, they consent to the provision of behavioral healthcare and the services will be billed under their insurance.  Client and/or legal guardian expressed understanding and consented to Telemedicine visit: Yes    Presenting Concerns:  - Ongoing depressive symptoms that's affecting her daily functioning  Mental Status Exam: Appearance:  Neat     Behavior: Appropriate  Motor: Normal  Speech/Language:  Normal Rate  Affect: Depressed  Mood: depressed  Thought process: normal  Thought content:   WNL  Sensory/Perceptual disturbances:   WNL  Orientation: oriented to person, place, time/date, situation, and day of week  Attention: Good  Concentration: Good  Memory: WNL  Fund of knowledge:  Good  Insight:   Good  Judgment:  Good and Fair  Impulse Control: Good   Risk Assessment: Danger to Self:  No Self-injurious Behavior: No Danger to Others: No Duty to Warn:no   Subjective:  [name] reported ongoing difficulties with negative thoughts, sleep, decrease in appetite,  concentration, and relationships.   Interventions: Cognitive Behavioral Therapy- exploring insights about various parts of herself and the impact on her daily functioning.  Identified way to increase her physical & mindfulness activities.  Client Response: Analy was able to describe different parts of herself. There is a part that is holding on to the core belief of not being worthy and another  part of her that is confident in some aspects of her life, eg being smart, caring.  [name] was open to doing something with nature, 2-3x time/week, for at least 5 min at work or home, eg. Walking, wood working, catering manager.   Diagnosis:  Severe episode of recurrent major depressive disorder, without psychotic features (HCC)   Goals, Assessment & Plan:   [name] continues to report severe depressive symptoms.  She actively engages in therapy by completing homework assignments and open to implement coping strategies to improve her mood.  Treatment Level Weekly  Modality - TeleMedicine - Video  Goals: Increase her ability to implement coping strategies in her daily life to manage various stressors as evidenced by self-report. - [name] completed her assignment last visit by identifying positive qualities that people have shared with her.  - [name] also open to identifying core beliefs affecting her daily functioning.  - [name] was open to increasing her physical and pleasant activities to improve her mood   Target Date: 03/15/2024  Progress: Ongoing    [name] Brian P. Trudy, MSW, LCSW [employer] Therapist Main Office: [phone]

## 2024-02-29 ENCOUNTER — Ambulatory Visit (INDEPENDENT_AMBULATORY_CARE_PROVIDER_SITE_OTHER): Admitting: Clinical

## 2024-02-29 DIAGNOSIS — F332 Major depressive disorder, recurrent severe without psychotic features: Secondary | ICD-10-CM | POA: Diagnosis not present

## 2024-02-29 NOTE — Progress Notes (Unsigned)
 [facility] Counselor Progress Note - TELEMEDICINE VISIT  Patient ID: April Webster, MRN: [id number]    Date: 02/29/2024  Time Spent: 3:01pm   - 4pm  : *** Minutes  Types of Service: Individual psychotherapy and Video visit  Client and/or Legal Guardian location: Work office- [name], [place] Therapist location: [facility] [employer] - [place], [place] All persons participating in visit: Client & this therapist  I connected with client and/or legal guardian via Engineer, Civil (consulting)  (Video is Caregility application) and verified that I am speaking with the correct person using two identifiers. Discussed confidentiality: Yes   I discussed the limitations of telemedicine and the availability of in person appointments.  Discussed there is a possibility of technology failure and discussed alternative modes of communication if that failure occurs.  I discussed that engaging in this telemedicine visit, they consent to the provision of behavioral healthcare and the services will be billed under their insurance.  Client and/or legal guardian expressed understanding and consented to Telemedicine visit: Yes    Presenting Concerns: ***  Mental Status Exam: Appearance:  {PSY:22683}     Behavior: {PSY:21022743}  Motor: {PSY:22302}  Speech/Language:  {PSY:22685}  Affect: {PSY:22687}  Mood: {PSY:31886}  Thought process: {PSY:31888}  Thought content:   {PSY:5594003127}  Sensory/Perceptual disturbances:   {[phone]}  Orientation: {PSY:30297}  Attention: {PSY:22877}  Concentration: {PSY:443-537-1952}  Memory: {PSY:8253697282}  Fund of knowledge:  {PSY:443-537-1952}  Insight:   {PSY:443-537-1952}  Judgment:  {PSY:443-537-1952}  Impulse Control: {PSY:443-537-1952}   Risk Assessment: Danger to Self:  {PSY:22692} Self-injurious Behavior: {PSY:22692} Danger to Others: {PSY:22692} Duty to Warn:{PSY:311194}   Subjective:  ***   Interventions:  {PSY:226-670-8403}  Client Response: ***   Diagnosis:  No diagnosis found.   Goals, Assessment & Plan:   ***  Send worksheets/info about Zooming out helicopter view. DBT worksheet.  Treatment Level {Frequency of sessions.:26745}  Modality - TeleMedicine - Video  Goal:  ***  Target Date: ***  Progress: ***     Goals: ***  Target Date: ***  Progress: ***    Amine Adelson P. Trudy, MSW, LCSW [employer] Therapist Main Office: [phone]

## 2024-03-06 ENCOUNTER — Other Ambulatory Visit (HOSPITAL_COMMUNITY): Payer: Self-pay

## 2024-03-06 DIAGNOSIS — M47812 Spondylosis without myelopathy or radiculopathy, cervical region: Secondary | ICD-10-CM | POA: Diagnosis not present

## 2024-03-06 MED ORDER — ACETAMINOPHEN-CODEINE 300-30 MG PO TABS
1.0000 | ORAL_TABLET | Freq: Three times a day (TID) | ORAL | 0 refills | Status: AC
  Filled 2024-03-06: qty 90, 30d supply, fill #0

## 2024-03-07 ENCOUNTER — Other Ambulatory Visit: Payer: Self-pay

## 2024-03-12 ENCOUNTER — Other Ambulatory Visit: Payer: Self-pay

## 2024-03-12 ENCOUNTER — Telehealth: Payer: Self-pay | Admitting: Family Medicine

## 2024-03-12 DIAGNOSIS — M542 Cervicalgia: Secondary | ICD-10-CM

## 2024-03-12 DIAGNOSIS — R21 Rash and other nonspecific skin eruption: Secondary | ICD-10-CM

## 2024-03-12 DIAGNOSIS — M5030 Other cervical disc degeneration, unspecified cervical region: Secondary | ICD-10-CM

## 2024-03-12 NOTE — Telephone Encounter (Signed)
 Orders Placed This Encounter  Procedures   Ambulatory referral to Dermatology    Referral Priority:   Routine    Referral Type:   Consultation    Referral Reason:   Specialty Services Required    Requested Specialty:   Dermatology    Number of Visits Requested:   1   Ambulatory referral to Physical Therapy    Referral Priority:   Routine    Referral Type:   Physical Medicine    Referral Reason:   Specialty Services Required    Requested Specialty:   Physical Therapy    Number of Visits Requested:   1   Had waited 11 months to get dermatology appointment and then they canceled same-day on her would like to go somewhere else locally.  Still having persistent rash.  Would also like to go move forward with physical therapy for her neck.

## 2024-03-13 NOTE — Progress Notes (Unsigned)
 [facility] Counselor Progress Note - TELEMEDICINE VISIT  Patient ID: April Webster, MRN: [id number]    Date: 03/14/2024  Time Spent: 4pm-4:59pm 59 Minutes  Types of Service: Individual psychotherapy and Video visit  Client and/or Legal Guardian location: Work office- [name], [place] Therapist location: Remote Work - [place], [place] All persons participating in visit: Client & this therapist  I connected with client and/or legal guardian via Engineer, Civil (consulting)  (Video is Surveyor, mining) and verified that I am speaking with the correct person using two identifiers. Discussed confidentiality: Yes   I discussed the limitations of telemedicine and the availability of in person appointments.  Discussed there is a possibility of technology failure and discussed alternative modes of communication if that failure occurs.  I discussed that engaging in this telemedicine visit, they consent to the provision of behavioral healthcare and the services will be billed under their insurance.  Client and/or legal guardian expressed understanding and consented to Telemedicine visit: Yes    Presenting Concerns:  - Ongoing depression from relational stressors and increased workload  Mental Status Exam: Appearance:  Casual     Behavior: Appropriate  Motor: Normal  Speech/Language:  Normal Rate  Affect: Appropriate and Depressed  Mood: depressed  Thought process: normal  Thought content:   WNL  Sensory/Perceptual disturbances:   WNL  Orientation: oriented to person, place, time/date, and situation  Attention: Good  Concentration: Good  Memory: WNL  Fund of knowledge:  Good  Insight:   Good  Judgment:  Good  Impulse Control: Good   Risk Assessment: Danger to Self:  No Self-injurious Behavior: No Danger to Others: No Duty to Warn:no   Subjective:  Mayuri shared feeling exhausted with 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  Interventions:  Cognitive Behavioral Therapy - reviewed thoughts, emotions & responses to recent situations.  Reviewed accomplishments and practice zooming out when she gets into the rabbit holes that increased her anxiety& depressive symptoms Assessed for current SI.  Client Response: Tumeka reported that she's been feeling sad even though she tries to be present for her family, patients & co-workers.  She reported a decrease in appetite and ongoing difficulties with sleep.  She reported passive thoughts of being better off dead last week, she reported no intent or plan to harm herself.  Jocelynne was able to identify an accomplishment this past week with being confident and setting boundaries at work & at home.  Mattilyn will work on continuing those boundaries with not taking on anymore at work and letting some things go at home, eg staying home instead of going out or sleeping in tomorrow.   Diagnosis:  Severe episode of recurrent major depressive disorder, without psychotic features (HCC)   Goals, Assessment & Plan:   Treatment Level Weekly   Modality - TeleMedicine - Video   Goals: Increase her ability to implement coping strategies in her daily life to manage various stressors as evidenced by self-report.  Lilo is demonstrating increased implementation of coping strategies which can help improve her mood. - Braileigh reported she has been journaling and practicing mindfulness exercises by taking time to watch nature. - She also reported she was able to go ice-skating with her daughter which she enjoyed - Lakeyta was open to practicing zooming out when she starts to focus on specific things that's making her feel anxious or depressed. - Hazelynn will work on sleeping in tomorrow since she has the day off.  Target  Date: 12/13/2024  Progress: Ongoing     Matthew Pais P. Trudy, MSW, LCSW [employer] Therapist Main Office: [phone]

## 2024-03-14 ENCOUNTER — Other Ambulatory Visit: Payer: Self-pay

## 2024-03-14 ENCOUNTER — Ambulatory Visit: Attending: Family Medicine | Admitting: Physical Therapy

## 2024-03-14 ENCOUNTER — Encounter: Payer: Self-pay | Admitting: Clinical

## 2024-03-14 ENCOUNTER — Ambulatory Visit: Admitting: Clinical

## 2024-03-14 ENCOUNTER — Encounter: Payer: Self-pay | Admitting: Physical Therapy

## 2024-03-14 DIAGNOSIS — F332 Major depressive disorder, recurrent severe without psychotic features: Secondary | ICD-10-CM

## 2024-03-14 DIAGNOSIS — M542 Cervicalgia: Secondary | ICD-10-CM | POA: Diagnosis present

## 2024-03-14 DIAGNOSIS — M5030 Other cervical disc degeneration, unspecified cervical region: Secondary | ICD-10-CM | POA: Insufficient documentation

## 2024-03-14 NOTE — Therapy (Signed)
 " OUTPATIENT PHYSICAL THERAPY CERVICAL EVALUATION   Patient Name: April Webster MRN: [id number] DOB:[birth year], 48 y.o., female Today's Date: 03/14/2024  END OF SESSION:  PT End of Session - 03/14/24 1522     Visit Number 1    Number of Visits 8    Date for Recertification  05/09/24    Authorization Type [employer]    PT Start Time 1400    PT Stop Time 1445    PT Time Calculation (min) 45 min    Activity Tolerance Patient tolerated treatment well    Behavior During Therapy WFL for tasks assessed/performed          Past Medical History:  Diagnosis Date   Allergy    Arthritis    Chronic idiopathic constipation    DDD (degenerative disc disease), cervical and lumbar    Female pattern hair loss    Graves disease    Hyperlipidemia    Hypertension    Hypothyroidism    Lichen sclerosus    Raynaud's disease    Past Surgical History:  Procedure Laterality Date   ABDOMINOPLASTY     AUGMENTATION MAMMAPLASTY Bilateral 05/2019   Silicone    CESAREAN SECTION     CHOLECYSTECTOMY     COSMETIC SURGERY     DILITATION & CURRETTAGE/HYSTROSCOPY WITH NOVASURE ABLATION N/A 02/09/2022   Procedure: DILATATION & CURETTAGE/HYSTEROSCOPY WITH NOVASURE ABLATION, REMOVAL OF IUD;  Surgeon: Cleatus Moccasin, MD;  Location: [facility] OR;  Service: Gynecology;  Laterality: N/A;   PLACEMENT OF BREAST IMPLANTS     POSTERIOR LUMBAR FUSION 2 WITH HARDWARE REMOVAL Left 08/25/2023   Procedure: ARTHROSCOPY, SHOULDER WITH DEBRIDEMENT, BIOTENDONESIS REPAIR;  Surgeon: Cristy Bonner DASEN, MD;  Location: [facility];  Service: Orthopedics;  Laterality: Left;   THYROID  SURGERY     TUBAL LIGATION     Patient Active Problem List   Diagnosis Date Noted   Hot flashes 11/09/2023   Abnormal weight gain 10/27/2023   Night sweats 10/18/2023   Chronic neck pain 04/13/2023   Recurrent infections 03/02/2023   Chronic left shoulder pain 12/21/2022   Attention deficit hyperactivity disorder (ADHD), combined type 07/20/2022    Primary insomnia 09/09/2021   Chronic pain of right knee 08/12/2021   LGSIL on Pap smear of cervix 07/01/2020   Raynaud's disease 02/18/2020   IC (interstitial cystitis) 02/18/2020   Chronic constipation 02/18/2020   DDD (degenerative disc disease), cervical 12/14/2019   Gastroesophageal reflux disease 05/16/2019   Hypothyroidism 03/19/2019   Hyperlipidemia 03/19/2019   Facet syndrome, lumbar 03/19/2019   Female pattern hair loss 03/19/2019   Lichen sclerosus 03/19/2019   Upper airway cough syndrome 03/19/2019   IUD (intrauterine device) in place 12/06/2016   MDD (major depressive disorder), recurrent episode, severe (HCC) 05/10/2011    PCP: Alvan  REFERRING PROVIDER: Alvan  REFERRING DIAG: Cervical DDD  THERAPY DIAG:  Cervicalgia  Rationale for Evaluation and Treatment: Rehabilitation  ONSET DATE: 02/2024 (MD appt)  SUBJECTIVE:  SUBJECTIVE STATEMENT: Pt states she is having pain near C7. She states it hurts no matter what she is doing but it hurts worse with neck extension and lateral flexion. She reports it feels like burning. She has tried ice packs, meds, heat which all perform temporary relief but nothing seems to help long term. She has tried exercising but these also don't seem to help long term. She was prescribed narcotics which do help with pain but pt does not want to use narcotics in the day time or long term.   PERTINENT HISTORY:  Lt biceps tenodesis 08/2023, cervical and lumbar DDD, graves disease  PAIN:  Are you having pain? Yes: NPRS scale: 5-6/10 at baseline, 9/10 at worst Pain location: medial/Rt C7 Pain description: burn, sore Aggravating factors: end range cervical ROM Relieving factors: meds, ice/heat  PRECAUTIONS: None  RED  FLAGS: None     WEIGHT BEARING RESTRICTIONS: No  FALLS:  Has patient fallen in last 6 months? No   OCCUPATION: NP  PLOF: Independent  PATIENT GOALS: decrease pain  NEXT MD VISIT: PRN  OBJECTIVE:  Note: Objective measures were completed at Evaluation unless otherwise noted.  DIAGNOSTIC FINDINGS:  Cervical MRI: Moderate degenerative disc disease at C4-5, C5-6 and C6-7 has progressed since the prior study from 2022.   There is no spinal stenosis or disc herniation.  PATIENT SURVEYS:  PSFS: THE PATIENT SPECIFIC FUNCTIONAL SCALE  Place score of 0-10 (0 = unable to perform activity and 10 = able to perform activity at the same level as before injury or problem)  Activity Date: 03/14/24    Perform full neck flexion 5    2.     3.     4.      Average Score 5      Total Score = Sum of activity scores/number of activities  Minimally Detectable Change: 3 points (for single activity); 2 points (for average score)  [employer] [facility] (nd). The Patient Specific Functional Scale . Retrieved from [email or web address].com.[email or web address]   COGNITION: Overall cognitive status: Within functional limits for tasks assessed  SENSATION: WFL  POSTURE: rounded shoulders  PALPATION: Increased mm spasticity Rt > Lt cervical paraspinals, hypomobility CPAs C6-T1, UPAs WFL TTP C7 spinous process Mild pain relief with manual cervical traction Scapular mobility WFL  CERVICAL ROM:   Active ROM A/PROM (deg) eval  Flexion WFL pain end range  Extension WFL pain end range  Right lateral flexion WFL  Left lateral flexion WFL pain end range  Right rotation WFL  Left rotation WFL   (Blank rows = not tested)   UPPER EXTREMITY MMT: Grossly 4/5 bilat   CERVICAL SPECIAL TESTS:  Spurling's test: Negative and Distraction test: Negative    TREATMENT DATE: 03/14/24 Pt educated on PT goals and POC, rationale for treatment Cervical mechanical  traction 17#-7# x 8 minutes  PATIENT EDUCATION:  Education details: PT POC and goals Person educated: Patient Education method: Programmer, Multimedia, Demonstration, and Handouts Education comprehension: verbalized understanding and returned demonstration  HOME EXERCISE PROGRAM: To be administered at next visit  ASSESSMENT:  CLINICAL IMPRESSION: Patient is a 48 y.o. female who was seen today for physical therapy evaluation and treatment for cervical DDD. She presents with increased pain, decreased jt mobility, increased muscle spasticity and decreased functional activity tolerance. Pt will benefit from skilled PT to address deficits and improve functional mobility with decreased pain.   OBJECTIVE IMPAIRMENTS: decreased activity tolerance, decreased mobility, increased muscle spasms, and pain.   ACTIVITY LIMITATIONS: working  PARTICIPATION LIMITATIONS: meal prep, cleaning, community activity, and occupation  PERSONAL FACTORS: Profession, Time since onset of injury/illness/exacerbation, and 1-2 comorbidities: cervical DDD, history of Lt shoulder surgery, are also affecting patient's functional outcome.   REHAB POTENTIAL: Good  CLINICAL DECISION MAKING: Evolving/moderate complexity  EVALUATION COMPLEXITY: Moderate   GOALS: Goals reviewed with patient? Yes  SHORT TERM GOALS: Target date: 04/11/2024    Pt will be issued and be independent in initial HEP Baseline:  Goal status: INITIAL  2.  Pt will report pain at worst <= 7/10 Baseline:  Goal status: INITIAL    LONG TERM GOALS: Target date: 05/09/2024    Pt will be independent with advanced HEP Baseline:  Goal status: INITIAL  2.  Pt will report baseline pain <= 3/10 Baseline:  Goal status: INITIAL  3.  Pt will perform cervical ext [facility] without increase in pain Baseline:  Goal status:  INITIAL     PLAN:  PT FREQUENCY: 1x/week  PT DURATION: 8 weeks  PLANNED INTERVENTIONS: 97164- PT Re-evaluation, 97110-Therapeutic exercises, 97530- Therapeutic activity, [id number]- Neuromuscular re-education, 97535- Self Care, 02859- Manual therapy, [id number]- Aquatic Therapy, H9716- Electrical stimulation (unattended), 97035- Ultrasound, 79439 (1-2 muscles), 20561 (3+ muscles)- Dry Needling, Patient/Family education, Taping, Cryotherapy, and Moist heat  PLAN FOR NEXT SESSION: assess response to traction. Manual work. Modalities as indicated   Alisen Marsiglia, PT 03/14/2024, 3:23 PM   "

## 2024-03-20 ENCOUNTER — Encounter (HOSPITAL_BASED_OUTPATIENT_CLINIC_OR_DEPARTMENT_OTHER): Payer: Self-pay | Admitting: Orthopaedic Surgery

## 2024-03-21 ENCOUNTER — Ambulatory Visit: Attending: Family Medicine | Admitting: Physical Therapy

## 2024-03-21 ENCOUNTER — Ambulatory Visit: Admitting: Clinical

## 2024-03-21 ENCOUNTER — Encounter: Payer: Self-pay | Admitting: Physical Therapy

## 2024-03-21 DIAGNOSIS — F331 Major depressive disorder, recurrent, moderate: Secondary | ICD-10-CM | POA: Diagnosis not present

## 2024-03-21 DIAGNOSIS — M542 Cervicalgia: Secondary | ICD-10-CM | POA: Insufficient documentation

## 2024-03-21 NOTE — Progress Notes (Signed)
 [facility] Counselor Progress Note - TELEMEDICINE VISIT  Patient ID: April Webster, MRN: [id number]    Date: 03/21/2024  Time Spent:  3pm - 3:55pm 55 Minutes  Types of Service: Individual psychotherapy and Video visit  Client and/or Legal Guardian location: Work office- [name], [place] Therapist location: [facility] [employer] - [place], [place] All persons participating in visit: Client & this therapist  I connected with client and/or legal guardian via Engineer, Civil (consulting)  (Video is Caregility application) and verified that I am speaking with the correct person using two identifiers. Discussed confidentiality: Yes   I discussed the limitations of telemedicine and the availability of in person appointments.  Discussed there is a possibility of technology failure and discussed alternative modes of communication if that failure occurs.  I discussed that engaging in this telemedicine visit, they consent to the provision of behavioral healthcare and the services will be billed under their insurance.  Client and/or legal guardian expressed understanding and consented to Telemedicine visit: Yes    Presenting Concerns:  -Ongoing family and work stressors  Mental Status Exam: Appearance:  Casual     Behavior: Appropriate  Motor: Normal  Speech/Language:  Normal Rate  Affect: Appropriate and Depressed  Mood: depressed  Thought process: normal  Thought content:   WNL  Sensory/Perceptual disturbances:   WNL  Orientation: oriented to person, place, time/date, and situation  Attention: Good  Concentration: Good  Memory: WNL  Fund of knowledge:  Good  Insight:   Good  Judgment:  Good  Impulse Control: Good   Risk Assessment: Danger to Self:  No Self-injurious Behavior: No Danger to Others: No Duty to Warn:no   Subjective:  April Webster reported ongoing family stressors that is affecting her mood and daily functioning.  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  Interventions:  Cognitive Behavioral Therapy - identified accomplishments with changes in her behaviors and responses to others.   Client Response: April Webster reported that she's increased her physical activities, going to the gym since this helps her feel good and better about herself.  She is being more intentional in how she responds to her family members, instead of reacting instantly to the situation. She is communicating her needs, expectations, and boundaries with her husband.  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  April Webster shared that she is working towards decreasing her alcohol use at night to one drink.  She tried to stop but she reported it worsened her mood & sleep. She stated she is working towards taking her prescribed medications first and decreasing her alcohol use at night.   Diagnosis:  Moderate episode of recurrent major depressive disorder (HCC)   Goals, Assessment & Plan:   Treatment Level Weekly   Modality - TeleMedicine - Video   Goals: Increase her ability to implement coping strategies in her daily life to manage various stressors as evidenced by self-report.  Fynn continues to demonstrate implementation of healthy coping strategies including setting boundaries, increasing physical activities and decreasing alcohol use at night.  Target Date: 12/13/2024  Progress: Ongoing     Meir Elwood P. Trudy, MSW, LCSW [employer] Therapist Main Office: [phone]

## 2024-03-21 NOTE — Therapy (Signed)
 " OUTPATIENT PHYSICAL THERAPY CERVICAL TREATMENT   Patient Name: April Webster MRN: [id number] DOB:17-Sep-1975, 49 y.o., female Today's Date: 03/21/2024  END OF SESSION:  PT End of Session - 03/21/24 1501     Visit Number 2    Number of Visits 8    Date for Recertification  05/09/24    Authorization Type Aetna    PT Start Time 1400    PT Stop Time 1440    PT Time Calculation (min) 40 min    Activity Tolerance Patient tolerated treatment well    Behavior During Therapy WFL for tasks assessed/performed           Past Medical History:  Diagnosis Date   Allergy    Arthritis    Chronic idiopathic constipation    DDD (degenerative disc disease), cervical and lumbar    Female pattern hair loss    Graves disease    Hyperlipidemia    Hypertension    Hypothyroidism    Lichen sclerosus    Raynaud's disease    Past Surgical History:  Procedure Laterality Date   ABDOMINOPLASTY     AUGMENTATION MAMMAPLASTY Bilateral 05/2019   Silicone    CESAREAN SECTION     CHOLECYSTECTOMY     COSMETIC SURGERY     DILITATION & CURRETTAGE/HYSTROSCOPY WITH NOVASURE ABLATION N/A 02/09/2022   Procedure: DILATATION & CURETTAGE/HYSTEROSCOPY WITH NOVASURE ABLATION, REMOVAL OF IUD;  Surgeon: Cleatus Moccasin, MD;  Location: [facility] OR;  Service: Gynecology;  Laterality: N/A;   PLACEMENT OF BREAST IMPLANTS     THYROID  SURGERY     TUBAL LIGATION     Patient Active Problem List   Diagnosis Date Noted   Hot flashes 11/09/2023   Abnormal weight gain 10/27/2023   Night sweats 10/18/2023   Chronic neck pain 04/13/2023   Recurrent infections 03/02/2023   Chronic left shoulder pain 12/21/2022   Attention deficit hyperactivity disorder (ADHD), combined type 07/20/2022   Primary insomnia 09/09/2021   Chronic pain of right knee 08/12/2021   LGSIL on Pap smear of cervix 07/01/2020   Raynaud's disease 02/18/2020   IC (interstitial cystitis) 02/18/2020   Chronic constipation 02/18/2020   DDD (degenerative disc  disease), cervical 12/14/2019   Gastroesophageal reflux disease 05/16/2019   Hypothyroidism 03/19/2019   Hyperlipidemia 03/19/2019   Facet syndrome, lumbar 03/19/2019   Female pattern hair loss 03/19/2019   Lichen sclerosus 03/19/2019   Upper airway cough syndrome 03/19/2019   IUD (intrauterine device) in place 12/06/2016   MDD (major depressive disorder), recurrent episode, severe (HCC) 05/10/2011    PCP: Alvan  REFERRING PROVIDER: Alvan  REFERRING DIAG: Cervical DDD  THERAPY DIAG:  Cervicalgia  Rationale for Evaluation and Treatment: Rehabilitation  ONSET DATE: 02/2024 (MD appt)  SUBJECTIVE:  SUBJECTIVE STATEMENT: Pt states that she had increased pain after traction last visit. She used home massager but that made pain worse. She has returned to her baseline of 5-6/10 pain now   PERTINENT HISTORY:  Lt biceps tenodesis 08/2023, cervical and lumbar DDD, graves disease  Pt states she is having pain near C7. She states it hurts no matter what she is doing but it hurts worse with neck extension and lateral flexion. She reports it feels like burning. She has tried ice packs, meds, heat which all perform temporary relief but nothing seems to help long term. She has tried exercising but these also don't seem to help long term. She was prescribed narcotics which do help with pain but pt does not want to use narcotics in the day time or long term.  PAIN:  Are you having pain? Yes: NPRS scale: 5-6/10 at baseline, 9/10 at worst Pain location: medial/Rt C7 Pain description: burn, sore Aggravating factors: end range cervical ROM Relieving factors: meds, ice/heat  PRECAUTIONS: None  RED FLAGS: None     WEIGHT BEARING RESTRICTIONS: No  FALLS:  Has patient fallen in last 6  months? No   OCCUPATION: NP  PLOF: Independent  PATIENT GOALS: decrease pain  NEXT MD VISIT: PRN  OBJECTIVE:  Note: Objective measures were completed at Evaluation unless otherwise noted.  DIAGNOSTIC FINDINGS:  Cervical MRI: Moderate degenerative disc disease at C4-5, C5-6 and C6-7 has progressed since the prior study from 2022.   There is no spinal stenosis or disc herniation.  PATIENT SURVEYS:  PSFS: THE PATIENT SPECIFIC FUNCTIONAL SCALE  Place score of 0-10 (0 = unable to perform activity and 10 = able to perform activity at the same level as before injury or problem)  Activity Date: 03/14/24    Perform full neck flexion 5    2.     3.     4.      Average Score 5      Total Score = Sum of activity scores/number of activities  Minimally Detectable Change: 3 points (for single activity); 2 points (for average score)  [facility] (nd). The Patient Specific Functional Scale . Retrieved from [email or web address]   COGNITION: Overall cognitive status: Within functional limits for tasks assessed  SENSATION: WFL  POSTURE: rounded shoulders  PALPATION: Increased mm spasticity Rt > Lt cervical paraspinals, hypomobility CPAs C6-T1, UPAs WFL TTP C7 spinous process Mild pain relief with manual cervical traction Scapular mobility WFL  CERVICAL ROM:   Active ROM A/PROM (deg) eval  Flexion WFL pain end range  Extension WFL pain end range  Right lateral flexion WFL  Left lateral flexion WFL pain end range  Right rotation WFL  Left rotation WFL   (Blank rows = not tested)   UPPER EXTREMITY MMT: Grossly 4/5 bilat   CERVICAL SPECIAL TESTS:  Spurling's test: Negative and Distraction test: Negative    [facility] Adult PT Treatment:                                                DATE: 03/21/24  Manual Therapy: STM and TPR suboccipitals, cervical parapsinals, UT Cervical ROM Passive UT, levator  stretches Gentle manual traction Neuromuscular re-ed: Chin tuck prone on elbows Therapeutic Activity: Supine head on coregeous ball: nods, rotation Cervical extension with towel   TREATMENT DATE: 03/14/24 Pt educated on PT  goals and POC, rationale for treatment Cervical mechanical traction 17#-7# x 8 minutes                                                                                                                                 PATIENT EDUCATION:  Education details: PT POC and goals Person educated: Patient Education method: Programmer, Multimedia, Demonstration, and Handouts Education comprehension: verbalized understanding and returned demonstration  HOME EXERCISE PROGRAM: To be administered at next visit  ASSESSMENT:  CLINICAL IMPRESSION: Pt with good response to manual therapy. She is able to tolerate cervical extension with towel roll but has difficulty tolerating cervical extension without. She leaves session in no acute distress  OBJECTIVE IMPAIRMENTS: decreased activity tolerance, decreased mobility, increased muscle spasms, and pain.     GOALS: Goals reviewed with patient? Yes  SHORT TERM GOALS: Target date: 04/11/2024    Pt will be issued and be independent in initial HEP Baseline:  Goal status: INITIAL  2.  Pt will report pain at worst <= 7/10 Baseline:  Goal status: INITIAL    LONG TERM GOALS: Target date: 05/09/2024    Pt will be independent with advanced HEP Baseline:  Goal status: INITIAL  2.  Pt will report baseline pain <= 3/10 Baseline:  Goal status: INITIAL  3.  Pt will perform cervical ext [facility] without increase in pain Baseline:  Goal status: INITIAL     PLAN:  PT FREQUENCY: 1x/week  PT DURATION: 8 weeks  PLANNED INTERVENTIONS: 97164- PT Re-evaluation, 97110-Therapeutic exercises, 97530- Therapeutic activity, 97112- Neuromuscular re-education, 97535- Self Care, 02859- Manual therapy, [id number]- Aquatic Therapy, H9716- Electrical  stimulation (unattended), 97035- Ultrasound, 79439 (1-2 muscles), 20561 (3+ muscles)- Dry Needling, Patient/Family education, Taping, Cryotherapy, and Moist heat  PLAN FOR NEXT SESSION: deep neck flexor strength, Manual work. Modalities as indicated   Lillyth Spong, PT 03/21/2024, 3:02 PM   "

## 2024-03-22 ENCOUNTER — Other Ambulatory Visit: Payer: Self-pay

## 2024-03-28 ENCOUNTER — Ambulatory Visit: Admitting: Clinical

## 2024-03-28 ENCOUNTER — Other Ambulatory Visit: Payer: Self-pay | Admitting: *Deleted

## 2024-03-28 ENCOUNTER — Ambulatory Visit: Admitting: Physical Therapy

## 2024-03-28 ENCOUNTER — Ambulatory Visit

## 2024-03-28 DIAGNOSIS — M25572 Pain in left ankle and joints of left foot: Secondary | ICD-10-CM | POA: Diagnosis not present

## 2024-03-28 DIAGNOSIS — F331 Major depressive disorder, recurrent, moderate: Secondary | ICD-10-CM

## 2024-03-28 NOTE — Progress Notes (Signed)
 [facility] Counselor Progress Note - TELEMEDICINE VISIT  Patient ID: April Webster, MRN: [id number]    Date: 03/28/2024  Time Spent:  2:58pm-3:53pm  Minutes  Types of Service: Individual psychotherapy and Video visit  Client and/or Legal Guardian location: Work office- [name], [place] Therapist location: [facility] [employer] - [place], [place] All persons participating in visit: Client & this therapist  I connected with client and/or legal guardian via [employer] (consulting)  (Video is Caregility application) and verified that I am speaking with the correct person using two identifiers. Discussed confidentiality: Yes   I discussed the limitations of telemedicine and the availability of in person appointments.  Discussed there is a possibility of technology failure and discussed alternative modes of communication if that failure occurs.  I discussed that engaging in this telemedicine visit, they consent to the provision of behavioral healthcare and the services will be billed under their insurance.  Client and/or legal guardian expressed understanding and consented to Telemedicine visit: Yes    Presenting Concerns:  -Increased health problems that is limiting her physical activities and affecting her mood  Mental Status Exam: Appearance:  Casual     Behavior: Appropriate  Motor: Normal  Speech/Language:  Normal Rate  Affect: Appropriate and Depressed  Mood: depressed  Thought process: normal  Thought content:   WNL  Sensory/Perceptual disturbances:   WNL  Orientation: oriented to person, place, time/date, and situation  Attention: Good  Concentration: Fair  Memory: WNL  Fund of knowledge:  Good  Insight:   Good  Judgment:  Good  Impulse Control: Good   Risk Assessment: Danger to Self:  No Self-injurious Behavior: No Danger to Others: No Duty to Warn:no   Subjective:  April Webster reported not feeling well and dumb bell fell on her leg, which  has limited her physical activities and affecting her mood.  She also reported her throat is starting to hurt and starting to lose her voice.  Interventions:  Cognitive Behavioral Therapy - explored current thoughts, emotions, and behaviors surrounding situation with her spouse. Identified core beliefs that are affecting her decisions and behaviors.  Client Response: April Webster reported her thoughts, emotions, and efforts in getting her husband to find a job.  April Webster also shared the importance of keeping her word in giving him time to accomplish different things she's asked of him in the next 6-12 months.  April Webster was open to focusing on what she can do or control in the next few months.  She decided that she will schedule time when she can be by herself doing something she enjoys, eg thrift shopping.  She was open to exploring what she likes and who her identity is separate from being a partner or wife.  April Webster reported she was married at a young age and has not experienced living on her own so her identity has been connected to her husband for many years.   Diagnosis:  Moderate episode of recurrent major depressive disorder (HCC)   Goals, Assessment & Plan:   Treatment Level Weekly   Modality - TeleMedicine - Video   Goals: Increase her ability to implement coping strategies in her daily life to manage various stressors as evidenced by self-report.  - April Webster will go on dates by herself to increase her pleasant activities and get to know herself more as her own person.  She will start off with going thrift store shopping, which she stated she enjoys.   Target Date: 12/13/2024  Progress: Ongoing  Ronnie Doo P. Trudy, MSW, LCSW [employer] Therapist Main Office: [phone]

## 2024-04-03 ENCOUNTER — Ambulatory Visit: Payer: Self-pay | Admitting: Family Medicine

## 2024-04-03 NOTE — Progress Notes (Signed)
 Final read was negative which is great.

## 2024-04-04 ENCOUNTER — Ambulatory Visit: Admitting: Physical Therapy

## 2024-04-04 ENCOUNTER — Encounter: Payer: Self-pay | Admitting: Physical Therapy

## 2024-04-04 ENCOUNTER — Ambulatory Visit (INDEPENDENT_AMBULATORY_CARE_PROVIDER_SITE_OTHER): Admitting: Clinical

## 2024-04-04 DIAGNOSIS — F331 Major depressive disorder, recurrent, moderate: Secondary | ICD-10-CM

## 2024-04-04 DIAGNOSIS — M542 Cervicalgia: Secondary | ICD-10-CM | POA: Diagnosis not present

## 2024-04-04 NOTE — Progress Notes (Signed)
 [facility] Counselor Progress Note - TELEMEDICINE VISIT  Patient ID: April Webster, MRN: [id number]    Date: 03/16/24  Time Spent: 3:02pm   - 3:40pm  : 38 Minutes  Types of Service: Individual psychotherapy and Video visit  Client and/or Legal Guardian location: Work office - [name], [place] Therapist location: [facility] [employer] - [place], [place] All persons participating in visit: Client & this therapist  I connected with client and/or legal guardian via [employer] (consulting)  (Video is Caregility application) and verified that I am speaking with the correct person using two identifiers. Discussed confidentiality: Yes   I discussed the limitations of telemedicine and the availability of in person appointments.  Discussed there is a possibility of technology failure and discussed alternative modes of communication if that failure occurs.  I discussed that engaging in this telemedicine visit, they consent to the provision of behavioral healthcare and the services will be billed under their insurance.  Client and/or legal guardian expressed understanding and consented to Telemedicine visit: Yes    Presenting Concerns:  - Ongoing stressors with her health due to recent ankle pain and family relationships  Mental Status Exam: Appearance:  Casual     Behavior: Appropriate  Motor: Normal  Speech/Language:  Normal Rate  Affect: Depressed  Mood: anxious and depressed  Thought process: normal  Thought content:   WNL  Sensory/Perceptual disturbances:   Pain in her ankle/foot  Orientation: oriented to person, place, time/date, situation, and day of week  Attention: Good  Concentration: Fair  Memory: WNL  Fund of knowledge:  Good  Insight:   Good  Judgment:  Good  Impulse Control: Good   Risk Assessment: Danger to Self:  No Self-injurious Behavior: No Danger to Others: No Duty to Warn:no   Subjective:  April Webster reported increased pain in her  ankle/foot and has a doctor's appointment today about 4:15pm so appointment shorter today so she can go to her doctor's appointment. April Webster reported ongoing difficulties with her relationship and internal conflicts.   Interventions: Cognitive Behavioral Therapy- Explored thoughts, emotions & behaviors with her current situation. Identified alternative behaviors/thoughts that may improve her mood & decrease anxiety about the future.  Client Response: April Webster reported that even though she's communicated more directly about her expectations to her spouse, he does not appear motivated to change his mindset or behaviors to work with her in contributing financially for their family, as well as other changes in their relationship.  April Webster shared her anxiety about her future, questioning what will happen or how she will feel, which appears to make her feel indecisive in the present day.  She identified activities that she can do in the present time that she stated she wants to do that will make her happy.   Diagnosis:  Moderate episode of recurrent major depressive disorder (HCC)   Goals, Assessment & Plan:   Treatment Level Weekly   Modality - TeleMedicine - Video   Goals: Increase her ability to implement coping strategies in her daily life to manage various stressors as evidenced by self-report.    - April Webster demonstrated insight by identifying present-focused activities that she associates as positive and will improve her mood. She plans to go thrift store shopping Saturday morning if the weather is good, although there is a winter weather warning in effect for this weekend.   Target Date: 12/13/2024  Progress: Ongoing     Goal:  Increase her ability to utilize emerging insight and strengthen secure attachment  responses when engaging in decision-making regarding significant relationship changes. - April Webster was able to verbalize internal conflicts surrounding her marital relationship and various things she  needs to consider for her overall health.  Target Date: 12/13/2024  Progress: Ongoing     April Webster P. Trudy, MSW, LCSW [employer] Therapist Main Office: [phone]

## 2024-04-04 NOTE — Therapy (Signed)
 " OUTPATIENT PHYSICAL THERAPY CERVICAL TREATMENT   Patient Name: April Webster MRN: [id number] DOB:February 02, 1976, 49 y.o., female Today's Date: 04/04/2024  END OF SESSION:  PT End of Session - 04/04/24 1454     Visit Number 3    Number of Visits 8    Date for Recertification  05/09/24    Authorization Type Aetna    PT Start Time 1400    PT Stop Time 1442    PT Time Calculation (min) 42 min    Activity Tolerance Patient tolerated treatment well    Behavior During Therapy WFL for tasks assessed/performed            Past Medical History:  Diagnosis Date   Allergy    Arthritis    Chronic idiopathic constipation    DDD (degenerative disc disease), cervical and lumbar    Female pattern hair loss    Graves disease    Hyperlipidemia    Hypertension    Hypothyroidism    Lichen sclerosus    Raynaud's disease    Past Surgical History:  Procedure Laterality Date   ABDOMINOPLASTY     AUGMENTATION MAMMAPLASTY Bilateral 05/2019   Silicone    CESAREAN SECTION     CHOLECYSTECTOMY     COSMETIC SURGERY     DILITATION & CURRETTAGE/HYSTROSCOPY WITH NOVASURE ABLATION N/A 02/09/2022   Procedure: DILATATION & CURETTAGE/HYSTEROSCOPY WITH NOVASURE ABLATION, REMOVAL OF IUD;  Surgeon: Cleatus Moccasin, MD;  Location: [facility] OR;  Service: Gynecology;  Laterality: N/A;   PLACEMENT OF BREAST IMPLANTS     THYROID  SURGERY     TUBAL LIGATION     Patient Active Problem List   Diagnosis Date Noted   Hot flashes 11/09/2023   Abnormal weight gain 10/27/2023   Night sweats 10/18/2023   Chronic neck pain 04/13/2023   Recurrent infections 03/02/2023   Chronic left shoulder pain 12/21/2022   Attention deficit hyperactivity disorder (ADHD), combined type 07/20/2022   Primary insomnia 09/09/2021   Chronic pain of right knee 08/12/2021   LGSIL on Pap smear of cervix 07/01/2020   Raynaud's disease 02/18/2020   IC (interstitial cystitis) 02/18/2020   Chronic constipation 02/18/2020   DDD (degenerative disc  disease), cervical 12/14/2019   Gastroesophageal reflux disease 05/16/2019   Hypothyroidism 03/19/2019   Hyperlipidemia 03/19/2019   Facet syndrome, lumbar 03/19/2019   Female pattern hair loss 03/19/2019   Lichen sclerosus 03/19/2019   Upper airway cough syndrome 03/19/2019   IUD (intrauterine device) in place 12/06/2016   MDD (major depressive disorder), recurrent episode, severe (HCC) 05/10/2011    PCP: Alvan  REFERRING PROVIDER: Alvan  REFERRING DIAG: Cervical DDD  THERAPY DIAG:  Cervicalgia  Rationale for Evaluation and Treatment: Rehabilitation  ONSET DATE: 02/2024 (MD appt)  SUBJECTIVE:  SUBJECTIVE STATEMENT: Pt states she injured her Lt ankle last week and is now walking with a boot. She still complains of muscle tightness especially with neck flexion/extension   PERTINENT HISTORY:  Lt biceps tenodesis 08/2023, cervical and lumbar DDD, graves disease  Pt states she is having pain near C7. She states it hurts no matter what she is doing but it hurts worse with neck extension and lateral flexion. She reports it feels like burning. She has tried ice packs, meds, heat which all perform temporary relief but nothing seems to help long term. She has tried exercising but these also don't seem to help long term. She was prescribed narcotics which do help with pain but pt does not want to use narcotics in the day time or long term.  PAIN:  Are you having pain? Yes: NPRS scale: 5-6/10 at baseline, 9/10 at worst Pain location: medial/Rt C7 Pain description: burn, sore Aggravating factors: end range cervical ROM Relieving factors: meds, ice/heat  PRECAUTIONS: None  RED FLAGS: None     WEIGHT BEARING RESTRICTIONS: No  FALLS:  Has patient fallen in last 6 months?  No   OCCUPATION: NP  PLOF: Independent  PATIENT GOALS: decrease pain  NEXT MD VISIT: PRN  OBJECTIVE:  Note: Objective measures were completed at Evaluation unless otherwise noted.  DIAGNOSTIC FINDINGS:  Cervical MRI: Moderate degenerative disc disease at C4-5, C5-6 and C6-7 has progressed since the prior study from 2022.   There is no spinal stenosis or disc herniation.  PATIENT SURVEYS:  PSFS: THE PATIENT SPECIFIC FUNCTIONAL SCALE  Place score of 0-10 (0 = unable to perform activity and 10 = able to perform activity at the same level as before injury or problem)  Activity Date: 03/14/24    Perform full neck flexion 5    2.     3.     4.      Average Score 5      Total Score = Sum of activity scores/number of activities  Minimally Detectable Change: 3 points (for single activity); 2 points (for average score)  [employer] [facility] (nd). The Patient Specific Functional Scale . Retrieved from [email or web address]   COGNITION: Overall cognitive status: Within functional limits for tasks assessed  SENSATION: WFL  POSTURE: rounded shoulders  PALPATION: Increased mm spasticity Rt > Lt cervical paraspinals, hypomobility CPAs C6-T1, UPAs WFL TTP C7 spinous process Mild pain relief with manual cervical traction Scapular mobility WFL  CERVICAL ROM:   Active ROM A/PROM (deg) eval  Flexion WFL pain end range  Extension WFL pain end range  Right lateral flexion WFL  Left lateral flexion WFL pain end range  Right rotation WFL  Left rotation WFL   (Blank rows = not tested)   UPPER EXTREMITY MMT: Grossly 4/5 bilat   CERVICAL SPECIAL TESTS:  Spurling's test: Negative and Distraction test: Negative    [facility] Adult PT Treatment:                                                DATE: 04/04/24  Manual Therapy: STM and TPR suboccipitals, cervical parapsinals, UT Cupping UT bilat Mobilization with movement for  cervical rotation bilat  Therapeutic Activity: Cervical flexion/extension with cupping Cervical extension with towel UT stretch with towel   [facility] Adult PT Treatment:  DATE: 03/21/24  Manual Therapy: STM and TPR suboccipitals, cervical parapsinals, UT Cervical ROM Passive UT, levator stretches Gentle manual traction Neuromuscular re-ed: Chin tuck prone on elbows Therapeutic Activity: Supine head on coregeous ball: nods, rotation Cervical extension with towel   PATIENT EDUCATION:  Education details: PT POC and goals Person educated: Patient Education method: Programmer, Multimedia, Facilities Manager, and Handouts Education comprehension: verbalized understanding and returned demonstration  HOME EXERCISE PROGRAM: To be administered at next visit  ASSESSMENT:  CLINICAL IMPRESSION: Focused on manual work today with addition of cupping. Pt with good response to mobilization with movement, demonstrating increased ROM after treatment  OBJECTIVE IMPAIRMENTS: decreased activity tolerance, decreased mobility, increased muscle spasms, and pain.     GOALS: Goals reviewed with patient? Yes  SHORT TERM GOALS: Target date: 04/11/2024    Pt will be issued and be independent in initial HEP Baseline:  Goal status: INITIAL  2.  Pt will report pain at worst <= 7/10 Baseline:  Goal status: INITIAL    LONG TERM GOALS: Target date: 05/09/2024    Pt will be independent with advanced HEP Baseline:  Goal status: INITIAL  2.  Pt will report baseline pain <= 3/10 Baseline:  Goal status: INITIAL  3.  Pt will perform cervical ext [facility] without increase in pain Baseline:  Goal status: INITIAL     PLAN:  PT FREQUENCY: 1x/week  PT DURATION: 8 weeks  PLANNED INTERVENTIONS: 97164- PT Re-evaluation, 97110-Therapeutic exercises, 97530- Therapeutic activity, [id number]- Neuromuscular re-education, 97535- Self Care, 02859- Manual therapy, [id number]- Aquatic  Therapy, H9716- Electrical stimulation (unattended), 97035- Ultrasound, 79439 (1-2 muscles), 20561 (3+ muscles)- Dry Needling, Patient/Family education, Taping, Cryotherapy, and Moist heat  PLAN FOR NEXT SESSION: CHECK STGs deep neck flexor strength, Manual work. Modalities as indicated   Biagio Snelson, PT 04/04/2024, 2:55 PM   "

## 2024-04-11 ENCOUNTER — Ambulatory Visit: Admitting: Clinical

## 2024-04-11 ENCOUNTER — Encounter: Payer: Self-pay | Admitting: Physical Therapy

## 2024-04-11 ENCOUNTER — Encounter: Payer: Self-pay | Admitting: Family Medicine

## 2024-04-11 ENCOUNTER — Ambulatory Visit (INDEPENDENT_AMBULATORY_CARE_PROVIDER_SITE_OTHER): Admitting: Family Medicine

## 2024-04-11 ENCOUNTER — Other Ambulatory Visit: Payer: Self-pay

## 2024-04-11 ENCOUNTER — Other Ambulatory Visit (HOSPITAL_COMMUNITY): Payer: Self-pay

## 2024-04-11 ENCOUNTER — Ambulatory Visit: Admitting: Physical Therapy

## 2024-04-11 VITALS — BP 139/82 | HR 85 | Ht 63.0 in | Wt 119.2 lb

## 2024-04-11 DIAGNOSIS — M542 Cervicalgia: Secondary | ICD-10-CM | POA: Diagnosis not present

## 2024-04-11 DIAGNOSIS — Z0000 Encounter for general adult medical examination without abnormal findings: Secondary | ICD-10-CM | POA: Diagnosis not present

## 2024-04-11 DIAGNOSIS — K219 Gastro-esophageal reflux disease without esophagitis: Secondary | ICD-10-CM

## 2024-04-11 DIAGNOSIS — F902 Attention-deficit hyperactivity disorder, combined type: Secondary | ICD-10-CM | POA: Diagnosis not present

## 2024-04-11 DIAGNOSIS — F331 Major depressive disorder, recurrent, moderate: Secondary | ICD-10-CM | POA: Diagnosis not present

## 2024-04-11 DIAGNOSIS — F5101 Primary insomnia: Secondary | ICD-10-CM | POA: Diagnosis not present

## 2024-04-11 DIAGNOSIS — R232 Flushing: Secondary | ICD-10-CM | POA: Diagnosis not present

## 2024-04-11 DIAGNOSIS — I7300 Raynaud's syndrome without gangrene: Secondary | ICD-10-CM | POA: Diagnosis not present

## 2024-04-11 MED ORDER — AMLODIPINE BESYLATE 2.5 MG PO TABS
2.5000 mg | ORAL_TABLET | Freq: Every day | ORAL | 1 refills | Status: AC
  Filled 2024-04-11: qty 90, 90d supply, fill #0

## 2024-04-11 MED ORDER — LUMATEPERONE TOSYLATE 42 MG PO CAPS
42.0000 mg | ORAL_CAPSULE | Freq: Every day | ORAL | 1 refills | Status: AC
  Filled 2024-04-11: qty 90, 90d supply, fill #0

## 2024-04-11 MED ORDER — TRAZODONE HCL 100 MG PO TABS
100.0000 mg | ORAL_TABLET | Freq: Every day | ORAL | 3 refills | Status: AC
  Filled 2024-04-11: qty 90, 90d supply, fill #0

## 2024-04-11 MED ORDER — PANTOPRAZOLE SODIUM 20 MG PO TBEC
20.0000 mg | DELAYED_RELEASE_TABLET | Freq: Every day | ORAL | 3 refills | Status: AC
  Filled 2024-04-11: qty 90, 90d supply, fill #0

## 2024-04-11 MED ORDER — GABAPENTIN 300 MG PO CAPS
300.0000 mg | ORAL_CAPSULE | Freq: Every day | ORAL | 1 refills | Status: AC
  Filled 2024-04-11: qty 90, 90d supply, fill #0

## 2024-04-11 MED ORDER — LISDEXAMFETAMINE DIMESYLATE 50 MG PO CAPS
50.0000 mg | ORAL_CAPSULE | Freq: Every day | ORAL | 0 refills | Status: AC
  Filled 2024-04-11: qty 90, 90d supply, fill #0

## 2024-04-11 NOTE — Progress Notes (Signed)
 "  Complete physical exam  Patient: April Shek JessupFemale    DOB: Nov 10, 1975 48 y.o.   MRN: [id number]  Chief Complaint  Patient presents with   Annual Exam    Subjective:    April Webster is a 49 y.o. female who presents today for a complete physical exam. She reports consuming a high protein health diet. Working out 7 days per week until recent ankle injury She generally feels well. She reports sleeping fair with trazodone  to aid. She does not have additional problems to discuss today.   Discussed the use of AI scribe software for clinical note transcription with the patient, who gave verbal consent to proceed.  History of Present Illness April Webster is a 49 year old female who presents for an annual physical exam.  Rheumatoid arthritis and chronic pain - Rheumatoid arthritis referred to as 'my friend' - Current management includes Caplyta , gabapentin  at bedtime, and Tylenol  #3 as needed, typically after physical therapy sessions  Sleep disturbance - Takes trazodone  100 mg at night for sleep - Missing trazodone  doses results in impaired function the following day  Recent foot injury and physical activity - Recent foot injury required use of a walking boot - No longer using the boot and attempting to resume regular exercise routine, which previously included daily workouts, spin training, and cardio on a spin bike - Frequent difficulty maintaining exercise routine due to injuries  Food aversion and dietary intake - Experiences food aversion despite normal appearance and taste of food - Recent episode of unpalatable maple turkey burgers due to rubbery texture - Current diet: scrambled eggs and toast for breakfast, protein shake and grapes for lunch, egg salad sandwich with potato chips for dinner  Vasomotor symptoms - Night sweats and hot flashes with episodes of feeling overheated and drenched in sweat  Hypertension - History of high blood pressure, previously exacerbated by  hormonal contraceptives - Avoids hormone therapy due to cardiovascular risk concerns - Currently taking amlodipine  and HCTZ as needed  Medication and supplement use - Current medications include Pristiq , Vyvanse , finasteride , and Protonix  - Started vitamin D with K2 and a supplement containing ginkgo biloba, magnesium glycinate, rhodiola rosea, and vitamin C for mental clarity, attention, physical energy, and endurance  Respiratory symptoms - Albuterol  inhaler not used due to expiration and lack of efficacy  Gastrointestinal and bowel regulation - Uses tirzepatide  compound for bowel movement regulation, which is effective compared to other treatments  Dermatologic follow-up - Dermatology appointment scheduled for February   Has upcoming Dermatology appt. See gyn for pap, etc.    Most recent fall risk assessment:    02/09/2023    2:00 PM  Fall Risk   Falls in the past year? 1  Number falls in past yr: 1  Injury with Fall? 1   Risk for fall due to : History of fall(s)  Follow up Falls evaluation completed     Data saved with a previous flowsheet row definition     Most recent depression screenings:    04/13/2023   12:15 PM 12/15/2022   11:57 AM  PHQ 2/9 Scores  PHQ - 2 Score 3 2  PHQ- 9 Score 14  8      Data saved with a previous flowsheet row definition        Patient Care Team: Alvan Dorothyann BIRCH, MD as PCP - General (Family Medicine)   ROS    Objective:    BP 139/82   Pulse 85  Ht 5' 3 (1.6 m)   Wt 119 lb 3.2 oz (54.1 kg)   SpO2 100%   BMI 21.12 kg/m     Physical Exam Constitutional:      Appearance: Normal appearance.  HENT:     Head: Normocephalic and atraumatic.     Right Ear: Tympanic membrane, ear canal and external ear normal.     Left Ear: Tympanic membrane, ear canal and external ear normal.     Nose: Nose normal.     Mouth/Throat:     Pharynx: Oropharynx is clear.  Eyes:     Extraocular Movements: Extraocular movements intact.      Conjunctiva/sclera: Conjunctivae normal.     Pupils: Pupils are equal, round, and reactive to light.  Neck:     Thyroid : No thyromegaly.  Cardiovascular:     Rate and Rhythm: Normal rate and regular rhythm.  Pulmonary:     Effort: Pulmonary effort is normal.     Breath sounds: Normal breath sounds.  Abdominal:     General: Bowel sounds are normal.     Palpations: Abdomen is soft.     Tenderness: There is no abdominal tenderness.  Musculoskeletal:        General: No swelling.     Cervical back: Neck supple.  Skin:    General: Skin is warm and dry.  Neurological:     Mental Status: She is oriented to person, place, and time.  Psychiatric:        Mood and Affect: Mood normal.        Behavior: Behavior normal.       No results found for any visits on 04/11/24.      Assessment & Plan:    Routine Health Maintenance and Physical Exam Immunization History  Administered Date(s) Administered   Influenza Split 01/16/2012, 11/21/2014   Influenza, Quadrivalent, Recombinant, Inj, Pf 12/24/2021   Influenza, Seasonal, Injecte, Preservative Fre 12/23/2022, 12/23/2023   Influenza,inj,Quad PF,6+ Mos 11/29/2016, 12/05/2017, 11/29/2019   Influenza-Unspecified 12/21/2018, 01/05/2021   PFIZER(Purple Top)SARS-COV-2 Vaccination 10/12/2019, 11/02/2019   PNEUMOCOCCAL CONJUGATE-20 05/30/2023   Td 02/22/2008   Tdap 02/22/2008, 04/28/2017    Health Maintenance  Topic Date Due   Hepatitis B Vaccines 19-59 Average Risk (1 of 3 - 19+ 3-dose series) Never done   Cervical Cancer Screening (HPV/Pap Cotest)  06/04/2024   COVID-19 Vaccine (3 - 2025-26 season) 04/12/2025 (Originally 11/14/2023)   Fecal DNA (Cologuard)  09/27/2024   Mammogram  11/02/2024   DTaP/Tdap/Td (4 - Td or Tdap) 04/29/2027   Influenza Vaccine  Completed   HPV VACCINES (No Doses Required) Completed   Hepatitis C Screening  Completed   HIV Screening  Completed   Pneumococcal Vaccine  Aged Out   Meningococcal B Vaccine   Aged Out   Colonoscopy  Discontinued    Discussed health benefits of physical activity, and encouraged her to engage in regular exercise appropriate for her age and condition.  Problem List Items Addressed This Visit       Cardiovascular and Mediastinum   Raynaud's disease   Relevant Medications   amLODipine  (NORVASC ) 2.5 MG tablet   Hot flashes   Relevant Medications   amLODipine  (NORVASC ) 2.5 MG tablet   gabapentin  (NEURONTIN ) 300 MG capsule     Digestive   Gastroesophageal reflux disease   Relevant Medications   pantoprazole  (PROTONIX ) 20 MG tablet     Other   Primary insomnia   Relevant Medications   traZODone  (DESYREL ) 100 MG tablet   Attention  deficit hyperactivity disorder (ADHD), combined type   Relevant Medications   lisdexamfetamine  (VYVANSE ) 50 MG capsule   Other Visit Diagnoses       Wellness examination    -  Primary       Assessment and Plan Assessment & Plan Primary insomnia Improved sleep with trazodone  100 mg at night. Missing doses causes daytime fatigue. Night sweats and hot flashes affect sleep quality. Hormone therapy avoided due to cardiovascular risks. - Continue trazodone  100 mg at night.  Menopausal symptoms Night sweats and hot flashes affect daily activities. Hormone therapy avoided due to cardiovascular risks.  Raynauds managed with amlodipine . Previous contraceptives increased blood pressure, leading to discontinuation. - Continue amlodipine  as prescribed.  Cervicalgia Neck tightness post-physical therapy. Uses Tylenol  #3 for pain. - Continue Tylenol  #3 as needed for pain management.  Foot injury, resolving Foot injury resolving. Walking boot discontinued. No fractures or tendon issues, soft tissue damage noted.  Constipation Managed with tirzepatide  compound. Trulance  with Senokot less effective. - Continue tirzepatide  compound for bowel movement regulation.  General Health Maintenance Regular exercise and meal prep. Food  aversion under stress. Consumes protein shakes and limited meals. Alcohol intake reduced. Vitamin D and K2 supplementation ongoing. - Continue regular exercise and meal prep. - Maintain current alcohol intake reduction. - Continue vitamin D and K2 supplementation. - Attend dermatology appointment in February.  Refills ent to pharmacy   Return in about 6 months (around 10/09/2024) for Mood.    Dorothyann Byars, MD [facility] at [facility]    "

## 2024-04-11 NOTE — Therapy (Signed)
 " OUTPATIENT PHYSICAL THERAPY CERVICAL TREATMENT   Patient Name: April Webster MRN: [id number] DOB:20-Oct-1975, 49 y.o., female Today's Date: 04/11/2024  END OF SESSION:  PT End of Session - 04/11/24 1444     Visit Number 4    Number of Visits 8    Date for Recertification  05/09/24    Authorization Type [employer]    PT Start Time 1400    PT Stop Time 1440    PT Time Calculation (min) 40 min    Activity Tolerance Patient tolerated treatment well    Behavior During Therapy WFL for tasks assessed/performed             Past Medical History:  Diagnosis Date   Allergy    Arthritis    Chronic idiopathic constipation    DDD (degenerative disc disease), cervical and lumbar    Female pattern hair loss    Graves disease    Hyperlipidemia    Hypertension    Hypothyroidism    Lichen sclerosus    Raynaud's disease    Past Surgical History:  Procedure Laterality Date   ABDOMINOPLASTY     AUGMENTATION MAMMAPLASTY Bilateral 05/2019   Silicone    CESAREAN SECTION     CHOLECYSTECTOMY     COSMETIC SURGERY     DILITATION & CURRETTAGE/HYSTROSCOPY WITH NOVASURE ABLATION N/A 02/09/2022   Procedure: DILATATION & CURETTAGE/HYSTEROSCOPY WITH NOVASURE ABLATION, REMOVAL OF IUD;  Surgeon: Cleatus Moccasin, MD;  Location: [facility] OR;  Service: Gynecology;  Laterality: N/A;   PLACEMENT OF BREAST IMPLANTS     THYROID  SURGERY     TUBAL LIGATION     Patient Active Problem List   Diagnosis Date Noted   Hot flashes 11/09/2023   Abnormal weight gain 10/27/2023   Night sweats 10/18/2023   Chronic neck pain 04/13/2023   Recurrent infections 03/02/2023   Chronic left shoulder pain 12/21/2022   Attention deficit hyperactivity disorder (ADHD), combined type 07/20/2022   Primary insomnia 09/09/2021   Chronic pain of right knee 08/12/2021   LGSIL on Pap smear of cervix 07/01/2020   Raynaud's disease 02/18/2020   IC (interstitial cystitis) 02/18/2020   Chronic constipation 02/18/2020   DDD (degenerative  disc disease), cervical 12/14/2019   Gastroesophageal reflux disease 05/16/2019   Hypothyroidism 03/19/2019   Hyperlipidemia 03/19/2019   Facet syndrome, lumbar 03/19/2019   Female pattern hair loss 03/19/2019   Lichen sclerosus 03/19/2019   Upper airway cough syndrome 03/19/2019   IUD (intrauterine device) in place 12/06/2016   MDD (major depressive disorder), recurrent episode, severe (HCC) 05/10/2011    PCP: Alvan  REFERRING PROVIDER: Alvan  REFERRING DIAG: Cervical DDD  THERAPY DIAG:  Cervicalgia  Rationale for Evaluation and Treatment: Rehabilitation  ONSET DATE: 02/2024 (MD appt)  SUBJECTIVE:  SUBJECTIVE STATEMENT: Pt states her neck is not great. She states her neck hurt initially after treatment last week but then for the next few days it was better. Pain is now at a 3-4/10 at rest and 5-6/10 with movement   PERTINENT HISTORY:  Lt biceps tenodesis 08/2023, cervical and lumbar DDD, graves disease  Pt states she is having pain near C7. She states it hurts no matter what she is doing but it hurts worse with neck extension and lateral flexion. She reports it feels like burning. She has tried ice packs, meds, heat which all perform temporary relief but nothing seems to help long term. She has tried exercising but these also don't seem to help long term. She was prescribed narcotics which do help with pain but pt does not want to use narcotics in the day time or long term.  PAIN:  Are you having pain? Yes: NPRS scale: 5-6/10 at baseline, 9/10 at worst Pain location: medial/Rt C7 Pain description: burn, sore Aggravating factors: end range cervical ROM Relieving factors: meds, ice/heat  PRECAUTIONS: None  RED FLAGS: None     WEIGHT BEARING RESTRICTIONS:  No  FALLS:  Has patient fallen in last 6 months? No   OCCUPATION: NP  PLOF: Independent  PATIENT GOALS: decrease pain  NEXT MD VISIT: PRN  OBJECTIVE:  Note: Objective measures were completed at Evaluation unless otherwise noted.  DIAGNOSTIC FINDINGS:  Cervical MRI: Moderate degenerative disc disease at C4-5, C5-6 and C6-7 has progressed since the prior study from 2022.   There is no spinal stenosis or disc herniation.  PATIENT SURVEYS:  PSFS: THE PATIENT SPECIFIC FUNCTIONAL SCALE  Place score of 0-10 (0 = unable to perform activity and 10 = able to perform activity at the same level as before injury or problem)  Activity Date: 03/14/24    Perform full neck flexion 5    2.     3.     4.      Average Score 5      Total Score = Sum of activity scores/number of activities  Minimally Detectable Change: 3 points (for single activity); 2 points (for average score)  [facility] (nd). The Patient Specific Functional Scale . Retrieved from [email or web address]   COGNITION: Overall cognitive status: Within functional limits for tasks assessed  SENSATION: WFL  POSTURE: rounded shoulders  PALPATION: Increased mm spasticity Rt > Lt cervical paraspinals, hypomobility CPAs C6-T1, UPAs WFL TTP C7 spinous process Mild pain relief with manual cervical traction Scapular mobility WFL  CERVICAL ROM:   Active ROM A/PROM (deg) eval  Flexion WFL pain end range  Extension WFL pain end range  Right lateral flexion WFL  Left lateral flexion WFL pain end range  Right rotation WFL  Left rotation WFL   (Blank rows = not tested)   UPPER EXTREMITY MMT: Grossly 4/5 bilat   CERVICAL SPECIAL TESTS:  Spurling's test: Negative and Distraction test: Negative    [facility] Adult PT Treatment:                                                DATE: 04/11/24 Therapeutic Exercise: Supine PROM cervical rotation, lateral  flexion Manual Therapy: STM and TPR suboccipitals, cervical parapsinals, UT Cupping UT bilat Mobilization with movement for cervical rotation bilat  Therapeutic Activity: Cervical rotation AROM Cervical flex/ext AROM   [facility]  Adult PT Treatment:                                                DATE: 04/04/24  Manual Therapy: STM and TPR suboccipitals, cervical parapsinals, UT Cupping UT bilat Mobilization with movement for cervical rotation bilat  Therapeutic Activity: Cervical flexion/extension with cupping Cervical extension with towel UT stretch with towel   [facility] Adult PT Treatment:                                                DATE: 03/21/24  Manual Therapy: STM and TPR suboccipitals, cervical parapsinals, UT Cervical ROM Passive UT, levator stretches Gentle manual traction Neuromuscular re-ed: Chin tuck prone on elbows Therapeutic Activity: Supine head on coregeous ball: nods, rotation Cervical extension with towel   PATIENT EDUCATION:  Education details: PT POC and goals Person educated: Patient Education method: Programmer, Multimedia, Facilities Manager, and Handouts Education comprehension: verbalized understanding and returned demonstration  HOME EXERCISE PROGRAM: To be administered at next visit  ASSESSMENT:  CLINICAL IMPRESSION: Continued to focus on  manual work with pt stating good results after last session. Plan to progress manual work and cervical mobility as tolerated  OBJECTIVE IMPAIRMENTS: decreased activity tolerance, decreased mobility, increased muscle spasms, and pain.     GOALS: Goals reviewed with patient? Yes  SHORT TERM GOALS: Target date: 04/11/2024    Pt will be issued and be independent in initial HEP Baseline:  Goal status: MET  2.  Pt will report pain at worst <= 7/10 Baseline:  Goal status: MET    LONG TERM GOALS: Target date: 05/09/2024    Pt will be independent with advanced HEP Baseline:  Goal status: INITIAL  2.  Pt will  report baseline pain <= 3/10 Baseline:  Goal status: INITIAL  3.  Pt will perform cervical ext [facility] without increase in pain Baseline:  Goal status: INITIAL     PLAN:  PT FREQUENCY: 1x/week  PT DURATION: 8 weeks  PLANNED INTERVENTIONS: 97164- PT Re-evaluation, 97110-Therapeutic exercises, 97530- Therapeutic activity, 97112- Neuromuscular re-education, 97535- Self Care, [id number]- Manual therapy, [id number]- Aquatic Therapy, H9716- Electrical stimulation (unattended), 97035- Ultrasound, 79439 (1-2 muscles), 20561 (3+ muscles)- Dry Needling, Patient/Family education, Taping, Cryotherapy, and Moist heat  PLAN FOR NEXT SESSION:  deep neck flexor strength, Manual work. Modalities as indicated   Laterica Matarazzo, PT 04/11/2024, 2:45 PM   "

## 2024-04-11 NOTE — Progress Notes (Unsigned)
 [facility] Counselor Progress Note - TELEMEDICINE VISIT  Patient ID: April Webster, MRN: [id number]    Date: 04/11/2024  Time Spent: 2:59pm-4:00pm 61  Minutes  Types of Service: Individual psychotherapy and Video visit  Client and/or Legal Guardian location: Work office - [name], [place] Therapist location: [facility] [employer] - [place], [place] All persons participating in visit: Client & this therapist  I connected with client and/or legal guardian via [employer], [employer] (consulting)  (Video is Caregility application) and verified that I am speaking with the correct person using two identifiers. Discussed confidentiality: Yes   I discussed the limitations of telemedicine and the availability of in person appointments.  Discussed there is a possibility of technology failure and discussed alternative modes of communication if that failure occurs.  I discussed that engaging in this telemedicine visit, they consent to the provision of behavioral healthcare and the services will be billed under their insurance.  Client and/or legal guardian expressed understanding and consented to Telemedicine visit: Yes    Presenting Concerns:  -Ongoing internal conflict with her values and decisions that she wants to make for herself as well as for her family  Mental Status Exam: Appearance:  Casual     Behavior: Appropriate  Motor: Normal  Speech/Language:  Normal Rate  Affect: Depressed  Mood: anxious and depressed  Thought process: normal  Thought content:   WNL  Sensory/Perceptual disturbances:   Ongoing pain  Orientation: oriented to person, place, time/date, situation, and day of week  Attention: Good  Concentration: Good  Memory: WNL  Fund of knowledge:  Good  Insight:   Good  Judgment:  Good  Impulse Control: Good   Risk Assessment: Danger to Self:  No Self-injurious Behavior: No Danger to Others: No Duty to Warn:no   Subjective:  April Webster reported ongoing  internal conflicts that continue to make her feel depressed.  Interventions: Cognitive Behavioral Therapy- identified & discussed core beliefs that are affecting her thoughts, emotions & behaviors. Reviewed challenging and reframing thoughts as well as continuing behaviors that can improve her mood.   Client Response: April Webster shared that she went thrift shopping but wasn't able to enjoy it since she wasn't feeling well. She also shared ongoing difficulties with sleep, appetite and behavioral responses that she wants to change.  Diagnosis:  Moderate episode of recurrent major depressive disorder (HCC)   Goals, Assessment & Plan:   Treatment Level Weekly   Modality - TeleMedicine - Video   Goals: Increase her ability to implement coping strategies in her daily life to manage various stressors as evidenced by self-report.   - [name] will focus on things she can control for today, including practicing one  mindfulness activity, eg noticing the nature around her. She will continue to take her medicine and try to eat or drink her protein shake.   Target Date: 12/13/2024  Progress: Ongoing     Goal:  Increase her ability to utilize emerging insight and strengthen secure attachment responses when engaging in decision-making regarding significant relationship changes. - April Webster is able to verbalize insight into her behaviors, which appear to be traumatic stress responses that have been used to protect herself for many years. - April Webster may benefit from increased knowledge of secure attachment responses and learning to implement them in her relationships.  Target Date: 12/13/2024  Progress: Ongoing   Plan for next visit: Provide and review information on secure attachment responses in various situations.   Voris Tigert P. Trudy, MSW, LCSW [employer]  Therapist Main Office: [phone]

## 2024-04-15 ENCOUNTER — Encounter (HOSPITAL_COMMUNITY): Payer: Self-pay

## 2024-04-16 ENCOUNTER — Other Ambulatory Visit: Payer: Self-pay | Admitting: Family Medicine

## 2024-04-16 ENCOUNTER — Other Ambulatory Visit (HOSPITAL_COMMUNITY): Payer: Self-pay

## 2024-04-16 ENCOUNTER — Telehealth: Payer: Self-pay | Admitting: Family Medicine

## 2024-04-16 ENCOUNTER — Other Ambulatory Visit: Payer: Self-pay

## 2024-04-16 MED ORDER — ESCITALOPRAM OXALATE 10 MG PO TABS
ORAL_TABLET | ORAL | 1 refills | Status: DC
  Filled 2024-04-16: qty 90, 38d supply, fill #0

## 2024-04-16 MED ORDER — ESCITALOPRAM OXALATE 10 MG PO TABS
ORAL_TABLET | ORAL | 1 refills | Status: AC
  Filled 2024-04-16: qty 90, 94d supply, fill #0

## 2024-04-16 NOTE — Telephone Encounter (Signed)
 Pt reports ran out of pristiq  2 weeks ago.  Didn't really help with hot flashes so would like to go back to Lexapro  for mood   Meds ordered this encounter  Medications   escitalopram  (LEXAPRO ) 10 MG tablet    Sig: Take 0.5 tablets (5 mg total) by mouth daily for 8 days, THEN 0.5 tablets (5 mg total) daily.    Dispense:  90 tablet    Refill:  1    Deliver pleaes

## 2024-04-18 ENCOUNTER — Ambulatory Visit: Admitting: Physical Therapy

## 2024-04-18 ENCOUNTER — Ambulatory Visit: Admitting: Clinical

## 2024-04-18 ENCOUNTER — Encounter: Payer: Self-pay | Admitting: Physical Therapy

## 2024-04-18 DIAGNOSIS — M542 Cervicalgia: Secondary | ICD-10-CM

## 2024-04-18 DIAGNOSIS — F331 Major depressive disorder, recurrent, moderate: Secondary | ICD-10-CM

## 2024-04-18 DIAGNOSIS — F902 Attention-deficit hyperactivity disorder, combined type: Secondary | ICD-10-CM

## 2024-04-18 NOTE — Therapy (Signed)
 " OUTPATIENT PHYSICAL THERAPY CERVICAL TREATMENT   Patient Name: April Webster MRN: [id number] DOB:June 18, 1975, 49 y.o., female Today's Date: 04/18/2024  END OF SESSION:  PT End of Session - 04/18/24 1530     Visit Number 5    Number of Visits 8    Date for Recertification  05/09/24    Authorization Type [employer]    PT Start Time 1400    PT Stop Time 1440    PT Time Calculation (min) 40 min    Activity Tolerance Patient tolerated treatment well    Behavior During Therapy WFL for tasks assessed/performed              Past Medical History:  Diagnosis Date   Allergy    Arthritis    Chronic idiopathic constipation    DDD (degenerative disc disease), cervical and lumbar    Female pattern hair loss    Graves disease    Hyperlipidemia    Hypertension    Hypothyroidism    Lichen sclerosus    Raynaud's disease    Past Surgical History:  Procedure Laterality Date   ABDOMINOPLASTY     AUGMENTATION MAMMAPLASTY Bilateral 05/2019   Silicone    CESAREAN SECTION     CHOLECYSTECTOMY     COSMETIC SURGERY     DILITATION & CURRETTAGE/HYSTROSCOPY WITH NOVASURE ABLATION N/A 02/09/2022   Procedure: DILATATION & CURETTAGE/HYSTEROSCOPY WITH NOVASURE ABLATION, REMOVAL OF IUD;  Surgeon: Cleatus Moccasin, MD;  Location: [facility] OR;  Service: Gynecology;  Laterality: N/A;   PLACEMENT OF BREAST IMPLANTS     THYROID  SURGERY     TUBAL LIGATION     Patient Active Problem List   Diagnosis Date Noted   Hot flashes 11/09/2023   Abnormal weight gain 10/27/2023   Night sweats 10/18/2023   Chronic neck pain 04/13/2023   Recurrent infections 03/02/2023   Chronic left shoulder pain 12/21/2022   Attention deficit hyperactivity disorder (ADHD), combined type 07/20/2022   Primary insomnia 09/09/2021   Chronic pain of right knee 08/12/2021   LGSIL on Pap smear of cervix 07/01/2020   Raynaud's disease 02/18/2020   IC (interstitial cystitis) 02/18/2020   Chronic constipation 02/18/2020   DDD (degenerative  disc disease), cervical 12/14/2019   Gastroesophageal reflux disease 05/16/2019   Hypothyroidism 03/19/2019   Hyperlipidemia 03/19/2019   Facet syndrome, lumbar 03/19/2019   Female pattern hair loss 03/19/2019   Lichen sclerosus 03/19/2019   Upper airway cough syndrome 03/19/2019   IUD (intrauterine device) in place 12/06/2016   MDD (major depressive disorder), recurrent episode, severe (HCC) 05/10/2011    PCP: Alvan  REFERRING PROVIDER: Alvan  REFERRING DIAG: Cervical DDD  THERAPY DIAG:  Cervicalgia  Rationale for Evaluation and Treatment: Rehabilitation  ONSET DATE: 02/2024 (MD appt)  SUBJECTIVE:  SUBJECTIVE STATEMENT: Pt states her neck feels good for a few days after treatment but then returns to stiff and painful   PERTINENT HISTORY:  Lt biceps tenodesis 08/2023, cervical and lumbar DDD, graves disease  Pt states she is having pain near C7. She states it hurts no matter what she is doing but it hurts worse with neck extension and lateral flexion. She reports it feels like burning. She has tried ice packs, meds, heat which all perform temporary relief but nothing seems to help long term. She has tried exercising but these also don't seem to help long term. She was prescribed narcotics which do help with pain but pt does not want to use narcotics in the day time or long term.  PAIN:  Are you having pain? Yes: NPRS scale: 5-6/10 at baseline, 9/10 at worst Pain location: medial/Rt C7 Pain description: burn, sore Aggravating factors: end range cervical ROM Relieving factors: meds, ice/heat  PRECAUTIONS: None  RED FLAGS: None     WEIGHT BEARING RESTRICTIONS: No  FALLS:  Has patient fallen in last 6 months? No   OCCUPATION: NP  PLOF: Independent  PATIENT  GOALS: decrease pain  NEXT MD VISIT: PRN  OBJECTIVE:  Note: Objective measures were completed at Evaluation unless otherwise noted.  DIAGNOSTIC FINDINGS:  Cervical MRI: Moderate degenerative disc disease at C4-5, C5-6 and C6-7 has progressed since the prior study from 2022.   There is no spinal stenosis or disc herniation.  PATIENT SURVEYS:  PSFS: THE PATIENT SPECIFIC FUNCTIONAL SCALE  Place score of 0-10 (0 = unable to perform activity and 10 = able to perform activity at the same level as before injury or problem)  Activity Date: 03/14/24    Perform full neck flexion 5    2.     3.     4.      Average Score 5      Total Score = Sum of activity scores/number of activities  Minimally Detectable Change: 3 points (for single activity); 2 points (for average score)  [facility] (nd). The Patient Specific Functional Scale . Retrieved from [email or web address]   COGNITION: Overall cognitive status: Within functional limits for tasks assessed  SENSATION: WFL  POSTURE: rounded shoulders  PALPATION: Increased mm spasticity Rt > Lt cervical paraspinals, hypomobility CPAs C6-T1, UPAs WFL TTP C7 spinous process Mild pain relief with manual cervical traction Scapular mobility WFL  CERVICAL ROM:   Active ROM A/PROM (deg) eval  Flexion WFL pain end range  Extension WFL pain end range  Right lateral flexion WFL  Left lateral flexion WFL pain end range  Right rotation WFL  Left rotation WFL   (Blank rows = not tested)   UPPER EXTREMITY MMT: Grossly 4/5 bilat   CERVICAL SPECIAL TESTS:  Spurling's test: Negative and Distraction test: Negative    [facility] Adult PT Treatment:                                                DATE: 04/18/24 Therapeutic Exercise: Cervical extension with towel Thoracic extension towel behind back Thoracic rotation at wall Seated cervical rotation Manual Therapy: Cupping  cervical paraspinals STM and TPR cervical paraspinals, suboccipitals, UT Mobilization with movement for cervical and thoracic extension   [facility] Adult PT Treatment:  DATE: 04/11/24 Therapeutic Exercise: Supine PROM cervical rotation, lateral flexion Manual Therapy: STM and TPR suboccipitals, cervical parapsinals, UT Cupping UT bilat Mobilization with movement for cervical rotation bilat  Therapeutic Activity: Cervical rotation AROM Cervical flex/ext AROM   [facility] Adult PT Treatment:                                                DATE: 04/04/24  Manual Therapy: STM and TPR suboccipitals, cervical parapsinals, UT Cupping UT bilat Mobilization with movement for cervical rotation bilat  Therapeutic Activity: Cervical flexion/extension with cupping Cervical extension with towel UT stretch with towel    PATIENT EDUCATION:  Education details: PT POC and goals Person educated: Patient Education method: Programmer, Multimedia, Demonstration, and Handouts Education comprehension: verbalized understanding and returned demonstration  HOME EXERCISE PROGRAM: To be administered at next visit  ASSESSMENT:  CLINICAL IMPRESSION: Pt continues with good response to manual work and cupping. She is able to tolerate more thoracic and cervical mobilty training today  OBJECTIVE IMPAIRMENTS: decreased activity tolerance, decreased mobility, increased muscle spasms, and pain.     GOALS: Goals reviewed with patient? Yes  SHORT TERM GOALS: Target date: 04/11/2024    Pt will be issued and be independent in initial HEP Baseline:  Goal status: MET  2.  Pt will report pain at worst <= 7/10 Baseline:  Goal status: MET    LONG TERM GOALS: Target date: 05/09/2024    Pt will be independent with advanced HEP Baseline:  Goal status: INITIAL  2.  Pt will report baseline pain <= 3/10 Baseline:  Goal status: INITIAL  3.  Pt will perform cervical ext [facility]  without increase in pain Baseline:  Goal status: INITIAL     PLAN:  PT FREQUENCY: 1x/week  PT DURATION: 8 weeks  PLANNED INTERVENTIONS: 97164- PT Re-evaluation, 97110-Therapeutic exercises, 97530- Therapeutic activity, 97112- Neuromuscular re-education, 97535- Self Care, [id number]- Manual therapy, [id number]- Aquatic Therapy, H9716- Electrical stimulation (unattended), 97035- Ultrasound, 79439 (1-2 muscles), 20561 (3+ muscles)- Dry Needling, Patient/Family education, Taping, Cryotherapy, and Moist heat  PLAN FOR NEXT SESSION:  deep neck flexor strength, Manual work. Modalities as indicated   Hazel Wrinkle, PT 04/18/2024, 3:31 PM   "

## 2024-04-18 NOTE — Progress Notes (Unsigned)
 [facility] Counselor Progress Note - TELEMEDICINE VISIT  Patient ID: April Webster, MRN: [id number]    Date: 04/18/24  Time Spent: 3pm - 4pm 60 Minutes  Types of Service: Individual psychotherapy and Video visit  Client and/or Legal Guardian location: Work office - [name], [place] Therapist location: [facility] [employer] - [place], [place] All persons participating in visit: Client & this therapist  I connected with client and/or legal guardian via [employer] (consulting)  (Video is Caregility application) and verified that I am speaking with the correct person using two identifiers. Discussed confidentiality: Yes   I discussed the limitations of telemedicine and the availability of in person appointments.  Discussed there is a possibility of technology failure and discussed alternative modes of communication if that failure occurs.  I discussed that engaging in this telemedicine visit, they consent to the provision of behavioral healthcare and the services will be billed under their insurance.  Client and/or legal guardian expressed understanding and consented to Telemedicine visit: Yes    Presenting Concerns:  -Ongoing internal and external conflicts that are affecting her health and daily functioning  Mental Status Exam: Appearance:  Casual     Behavior: Appropriate  Motor: Normal  Speech/Language:  Normal Rate  Affect: Depressed  Mood: anxious and depressed  Thought process: normal  Thought content:   WNL  Sensory/Perceptual disturbances:   Ongoing pain  Orientation: oriented to person, place, time/date, situation, and day of week  Attention: Good  Concentration: Good  Memory: WNL  Fund of knowledge:  Good  Insight:   Good  Judgment:  Good  Impulse Control: Good   Risk Assessment: Danger to Self:  No Self-injurious Behavior: No Danger to Others: No Duty to Warn:no   Subjective:  April Webster reported increased concerns between her & her  husband.  April Webster stated one of her dogs went through a difficult time this past weekend, which increased her anxiety level.  Interventions: Cognitive Behavioral Therapy- identified her thoughts, emotions & behaviors with the challenging situations that she's experiencing. - Identified accomplishments, strengths and coping strategies she can continue to utilize  Client Response: April Webster shared the various things that happened over the weekend that continues to affect her sleep, appetite, health and overall well-being.    April Webster dog, who she's had the longest, was having difficulties and April Webster's daughter offered to take her to the [facility] even in the snowy conditions. April Webster was anxious about her dog and her daughter.  April Webster shared she made a decision to separate from her husband. She reported that in the past week, he has responded in various ways, from making statements with moving out to continually telling her he loves her. April Webster stated that his behaviors are a continuation of patterns that she perceives are manipulative and controlling. April Webster denied any SI/HI. She did not report any immediate risks to her or her family's safety.  April Webster reported difficulties with focusing and completing tasks. The stress with her current relationship and trying to complete daily tasks are making her feel more anxious and depressed.  Diagnosis:  Moderate episode of recurrent major depressive disorder (HCC)  Attention deficit hyperactivity disorder (ADHD), combined type   Goals, Assessment & Plan:   Treatment Level Weekly   Modality - TeleMedicine - Video   Goals: Increase her ability to implement coping strategies in her daily life to manage various stressors as evidenced by self-report.   - April Webster will continue to focus on what she needs to do  only for that day, and what is in front of her, whether it's at for work or at home. This may help minimize anxiety by being more mindful of present task or interaction instead  of future concerns.    Target Date: 12/13/2024  Progress: Ongoing     Goal:  Increase her ability to utilize emerging insight and strengthen secure attachment responses when engaging in decision-making regarding significant relationship changes.  - April Webster was able to make a decision about her relationship with her spouse that she continues to be conflicted about. - April Webster was able to identify the progress she's made within herself to make this decision.  Target Date: 12/13/2024  Progress: Ongoing     April Webster P. Trudy, MSW, LCSW [employer] Therapist Main Office: [phone]

## 2024-04-19 ENCOUNTER — Other Ambulatory Visit (HOSPITAL_COMMUNITY): Payer: Self-pay

## 2024-04-20 ENCOUNTER — Other Ambulatory Visit (HOSPITAL_COMMUNITY): Payer: Self-pay

## 2024-04-25 ENCOUNTER — Ambulatory Visit: Admitting: Rehabilitative and Restorative Service Providers"

## 2024-04-25 ENCOUNTER — Ambulatory Visit: Admitting: Clinical

## 2024-05-02 ENCOUNTER — Ambulatory Visit: Admitting: Clinical

## 2024-05-09 ENCOUNTER — Ambulatory Visit: Admitting: Clinical

## 2024-05-16 ENCOUNTER — Ambulatory Visit: Admitting: Obstetrics and Gynecology

## 2024-05-16 ENCOUNTER — Ambulatory Visit: Admitting: Clinical

## 2024-05-23 ENCOUNTER — Ambulatory Visit: Admitting: Clinical

## 2024-05-30 ENCOUNTER — Ambulatory Visit: Admitting: Clinical

## 2024-06-06 ENCOUNTER — Ambulatory Visit: Admitting: Clinical
